# Patient Record
Sex: Female | Born: 1945 | Race: White | Hispanic: No | Marital: Married | Smoking: Former smoker
Health system: Southern US, Community
[De-identification: ages and names within clinical notes are randomized; demographics above are authoritative.]

## PROBLEM LIST (undated history)

## (undated) DIAGNOSIS — E785 Hyperlipidemia, unspecified: Secondary | ICD-10-CM

## (undated) DIAGNOSIS — J45909 Unspecified asthma, uncomplicated: Secondary | ICD-10-CM

## (undated) DIAGNOSIS — J449 Chronic obstructive pulmonary disease, unspecified: Secondary | ICD-10-CM

## (undated) DIAGNOSIS — D369 Benign neoplasm, unspecified site: Secondary | ICD-10-CM

## (undated) DIAGNOSIS — K649 Unspecified hemorrhoids: Secondary | ICD-10-CM

## (undated) DIAGNOSIS — N179 Acute kidney failure, unspecified: Secondary | ICD-10-CM

## (undated) DIAGNOSIS — E119 Type 2 diabetes mellitus without complications: Secondary | ICD-10-CM

## (undated) DIAGNOSIS — C679 Malignant neoplasm of bladder, unspecified: Secondary | ICD-10-CM

## (undated) DIAGNOSIS — R945 Abnormal results of liver function studies: Secondary | ICD-10-CM

## (undated) DIAGNOSIS — I739 Peripheral vascular disease, unspecified: Secondary | ICD-10-CM

## (undated) DIAGNOSIS — I10 Essential (primary) hypertension: Secondary | ICD-10-CM

## (undated) HISTORY — DX: Unspecified asthma, uncomplicated: J45.909

## (undated) HISTORY — DX: Benign neoplasm, unspecified site: D36.9

## (undated) HISTORY — DX: Essential (primary) hypertension: I10

## (undated) HISTORY — DX: Acute kidney failure, unspecified: N17.9

## (undated) HISTORY — DX: Type 2 diabetes mellitus without complications: E11.9

## (undated) HISTORY — DX: Abnormal results of liver function studies: R94.5

## (undated) HISTORY — DX: Hypomagnesemia: E83.42

## (undated) HISTORY — DX: Hyperlipidemia, unspecified: E78.5

## (undated) HISTORY — DX: Unspecified hemorrhoids: K64.9

## (undated) HISTORY — DX: Peripheral vascular disease, unspecified: I73.9

## (undated) HISTORY — PX: CHOLECYSTECTOMY: SHX55

## (undated) HISTORY — DX: Chronic obstructive pulmonary disease, unspecified: J44.9

---

## 1988-09-16 HISTORY — PX: OTHER SURGICAL HISTORY: SHX169

## 2000-12-02 ENCOUNTER — Encounter: Admission: RE | Admit: 2000-12-02 | Discharge: 2001-03-02 | Payer: Self-pay | Admitting: Unknown Physician Specialty

## 2002-11-03 ENCOUNTER — Ambulatory Visit (HOSPITAL_COMMUNITY): Admission: RE | Admit: 2002-11-03 | Discharge: 2002-11-03 | Payer: Self-pay | Admitting: Internal Medicine

## 2002-11-03 HISTORY — PX: ESOPHAGOGASTRODUODENOSCOPY: SHX1529

## 2003-09-07 ENCOUNTER — Ambulatory Visit (HOSPITAL_COMMUNITY): Admission: RE | Admit: 2003-09-07 | Discharge: 2003-09-07 | Payer: Self-pay | Admitting: Unknown Physician Specialty

## 2003-10-05 ENCOUNTER — Ambulatory Visit (HOSPITAL_COMMUNITY): Admission: RE | Admit: 2003-10-05 | Discharge: 2003-10-05 | Payer: Self-pay | Admitting: Internal Medicine

## 2004-08-14 ENCOUNTER — Ambulatory Visit: Payer: Self-pay | Admitting: Family Medicine

## 2004-09-21 ENCOUNTER — Ambulatory Visit: Payer: Self-pay | Admitting: Family Medicine

## 2004-10-03 ENCOUNTER — Ambulatory Visit: Payer: Self-pay | Admitting: Family Medicine

## 2004-11-28 ENCOUNTER — Ambulatory Visit: Payer: Self-pay | Admitting: Family Medicine

## 2004-12-24 ENCOUNTER — Ambulatory Visit: Payer: Self-pay | Admitting: Family Medicine

## 2005-01-01 ENCOUNTER — Ambulatory Visit: Payer: Self-pay | Admitting: Family Medicine

## 2005-01-22 ENCOUNTER — Ambulatory Visit: Payer: Self-pay | Admitting: Family Medicine

## 2005-02-25 ENCOUNTER — Ambulatory Visit: Payer: Self-pay | Admitting: Family Medicine

## 2005-05-01 ENCOUNTER — Ambulatory Visit: Payer: Self-pay | Admitting: Family Medicine

## 2005-07-03 ENCOUNTER — Ambulatory Visit: Payer: Self-pay | Admitting: Family Medicine

## 2005-08-27 ENCOUNTER — Ambulatory Visit: Payer: Self-pay | Admitting: Family Medicine

## 2005-09-10 ENCOUNTER — Ambulatory Visit: Payer: Self-pay | Admitting: Family Medicine

## 2005-10-10 ENCOUNTER — Ambulatory Visit (HOSPITAL_COMMUNITY): Admission: RE | Admit: 2005-10-10 | Discharge: 2005-10-10 | Payer: Self-pay | Admitting: Dermatology

## 2005-10-11 ENCOUNTER — Emergency Department (HOSPITAL_COMMUNITY): Admission: EM | Admit: 2005-10-11 | Discharge: 2005-10-11 | Payer: Self-pay | Admitting: Emergency Medicine

## 2006-01-15 ENCOUNTER — Ambulatory Visit: Payer: Self-pay | Admitting: Family Medicine

## 2006-01-28 ENCOUNTER — Ambulatory Visit: Payer: Self-pay | Admitting: Family Medicine

## 2006-02-06 ENCOUNTER — Ambulatory Visit: Payer: Self-pay | Admitting: Family Medicine

## 2006-04-24 ENCOUNTER — Ambulatory Visit: Payer: Self-pay | Admitting: Family Medicine

## 2006-07-18 ENCOUNTER — Ambulatory Visit: Payer: Self-pay | Admitting: Family Medicine

## 2006-07-30 ENCOUNTER — Ambulatory Visit: Payer: Self-pay | Admitting: Family Medicine

## 2006-11-03 ENCOUNTER — Ambulatory Visit: Payer: Self-pay | Admitting: Family Medicine

## 2006-12-03 ENCOUNTER — Ambulatory Visit: Payer: Self-pay | Admitting: Internal Medicine

## 2006-12-03 ENCOUNTER — Ambulatory Visit (HOSPITAL_COMMUNITY): Admission: RE | Admit: 2006-12-03 | Discharge: 2006-12-03 | Payer: Self-pay | Admitting: Internal Medicine

## 2006-12-03 ENCOUNTER — Encounter (INDEPENDENT_AMBULATORY_CARE_PROVIDER_SITE_OTHER): Payer: Self-pay | Admitting: Specialist

## 2006-12-03 HISTORY — PX: COLONOSCOPY: SHX174

## 2006-12-04 DIAGNOSIS — D369 Benign neoplasm, unspecified site: Secondary | ICD-10-CM

## 2006-12-04 DIAGNOSIS — K649 Unspecified hemorrhoids: Secondary | ICD-10-CM

## 2006-12-04 HISTORY — DX: Benign neoplasm, unspecified site: D36.9

## 2006-12-04 HISTORY — DX: Unspecified hemorrhoids: K64.9

## 2007-02-04 ENCOUNTER — Ambulatory Visit: Payer: Self-pay | Admitting: Family Medicine

## 2009-07-12 ENCOUNTER — Ambulatory Visit: Payer: Self-pay | Admitting: Cardiology

## 2010-10-10 ENCOUNTER — Ambulatory Visit
Admission: RE | Admit: 2010-10-10 | Discharge: 2010-10-10 | Payer: Self-pay | Source: Home / Self Care | Attending: Thoracic Surgery | Admitting: Thoracic Surgery

## 2010-10-10 ENCOUNTER — Other Ambulatory Visit: Payer: Self-pay | Admitting: Thoracic Surgery

## 2010-10-10 DIAGNOSIS — R918 Other nonspecific abnormal finding of lung field: Secondary | ICD-10-CM

## 2010-10-11 NOTE — Letter (Signed)
October 10, 2010  Cherie Ouch [address] Laurell Josephs. [place], [place] [address]  Re:  MONEA, PESANTEZ             DOB:  [birth year]  Dear Dr. Orson Aloe:  I appreciate the opportunity of seeing the patient.  This 65 year old patient was admitted in December with the right upper lobe pneumonia and had a right upper lobe mass.  There were also some subcarinal and questionable paratracheal adenopathy.  She is a long-time smoker, has been on oxygen for many nights for a while and is now on oxygen all the time.  Her other medical history include diabetes mellitus.  She had a right BKA.  She has Raynaud phenomenon, has esophageal reflux.  She had her bronchoscopy which showed some dysplastic squamous cells, but was nondiagnostic.  She was referred to [place] for further evaluation.  CT scan of the brain was negative.  Her medications include; Spiriva, glyburide, metformin, Actos, pravastatin, nifedipine, aspirin, Avapro, Clobetasol, Diovan, Januvia, Prilosec, and albuterol inhaler.  She is on 2 L of oxygen.  Mobic caused her to retain fluid.  FAMILY HISTORY:  Unremarkable.  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  SOCIAL HISTORY:  She is retired from [employer], quit smoking in 1991. Does not drink alcohol on a regular basis.  REVIEW OF SYSTEMS:  VITAL SIGNS:  She is 5 feet, 9 inches. GENERAL:  Weight is stable.  She has shortness of breath with exertion lying flat.  She is on home oxygen.  No hemoptysis. GI:  Reflux. GU:  No kidney disease, dysuria or frequent urination. VASCULAR:  Pain in her left foot when lying flat.  No DVT or TIAs. NEUROLOGICAL:  No dizziness, headaches, blackouts or seizures. MUSCULOSKELETAL:  Rash. PSYCHIATRIC:  No depression or nervousness. EYES/ENT:  She has had some recent decrease in her eyesight.  No changes in her hearing. HEMATOLOGICAL:  No problems with bleeding, clotting disorders,  or anemia.  PHYSICAL EXAMINATION:  Vital Signs:  Her blood pressure is 152/90, pulse 88, respirations 16, sats were 95%.  Head, Eyes, Ears, Nose and Throat: Unremarkable.  Neck:  Supple without thyromegaly.  There is no supraclavicular or axillary adenopathy.  Chest:  Clear to auscultation and percussion.  Heart:  Regular sinus rhythm.  No murmurs.  Abdomen: Soft with no hepatosplenomegaly.  Extremities:  Pulses are 2+.  She has a right BKA prosthesis.  No clubbing on the left side and no edema on the left side.  Neurological:  She is oriented x3.  Sensory and motor grossly are intact.  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  I will get the PET scan and then proceed with probably bronchoscopy with endobronchial ultrasound.  I appreciate the opportunity of seeing the patient.  Ines Bloomer, M.D. Electronically Signed  DPB/MEDQ  D:  10/10/2010  T:  10/11/2010  Job:  [id number]

## 2010-10-18 ENCOUNTER — Encounter (HOSPITAL_COMMUNITY): Payer: Medicaid Other | Attending: Thoracic Surgery

## 2010-10-18 ENCOUNTER — Encounter (HOSPITAL_COMMUNITY): Payer: Self-pay

## 2010-10-18 ENCOUNTER — Ambulatory Visit (HOSPITAL_COMMUNITY)
Admission: RE | Admit: 2010-10-18 | Discharge: 2010-10-18 | Disposition: A | Discharge status: Home / Self Care | Payer: Medicaid Other | Source: Ambulatory Visit | Attending: Thoracic Surgery | Admitting: Thoracic Surgery

## 2010-10-18 DIAGNOSIS — K7689 Other specified diseases of liver: Secondary | ICD-10-CM | POA: Insufficient documentation

## 2010-10-18 DIAGNOSIS — R222 Localized swelling, mass and lump, trunk: Secondary | ICD-10-CM | POA: Insufficient documentation

## 2010-10-18 DIAGNOSIS — I2510 Atherosclerotic heart disease of native coronary artery without angina pectoris: Secondary | ICD-10-CM | POA: Insufficient documentation

## 2010-10-18 DIAGNOSIS — R918 Other nonspecific abnormal finding of lung field: Secondary | ICD-10-CM

## 2010-10-18 DIAGNOSIS — I517 Cardiomegaly: Secondary | ICD-10-CM | POA: Insufficient documentation

## 2010-10-18 DIAGNOSIS — Z01812 Encounter for preprocedural laboratory examination: Secondary | ICD-10-CM | POA: Insufficient documentation

## 2010-10-18 LAB — CBC
HCT: 38.4 % (ref 36.0–46.0)
Hemoglobin: 12.7 g/dL (ref 12.0–15.0)
MCH: 28.4 pg (ref 26.0–34.0)
MCHC: 33.1 g/dL (ref 30.0–36.0)
MCV: 85.9 fL (ref 78.0–100.0)
Platelets: 325 10*3/uL (ref 150–400)
RBC: 4.47 MIL/uL (ref 3.87–5.11)
RDW: 13.8 % (ref 11.5–15.5)
WBC: 10.9 10*3/uL — ABNORMAL HIGH (ref 4.0–10.5)

## 2010-10-18 LAB — COMPREHENSIVE METABOLIC PANEL
ALT: 44 U/L — ABNORMAL HIGH (ref 0–35)
AST: 40 U/L — ABNORMAL HIGH (ref 0–37)
Albumin: 4.2 g/dL (ref 3.5–5.2)
Alkaline Phosphatase: 63 U/L (ref 39–117)
BUN: 10 mg/dL (ref 6–23)
CO2: 28 mEq/L (ref 19–32)
Calcium: 9.8 mg/dL (ref 8.4–10.5)
Chloride: 103 mEq/L (ref 96–112)
Creatinine, Ser: 0.72 mg/dL (ref 0.4–1.2)
GFR calc Af Amer: >60 mL/min (ref >60)
GFR calc non Af Amer: >60 mL/min (ref >60)
Glucose, Bld: 148 mg/dL — ABNORMAL HIGH (ref 70–99)
Potassium: 4.4 mEq/L (ref 3.5–5.1)
Sodium: 141 mEq/L (ref 135–145)
Total Bilirubin: 0.4 mg/dL (ref 0.3–1.2)
Total Protein: 7.4 g/dL (ref 6.0–8.3)

## 2010-10-18 LAB — PROTIME-INR
INR: 0.96 (ref 0.00–1.49)
Prothrombin Time: 13 seconds (ref 11.6–15.2)

## 2010-10-18 LAB — SURGICAL PCR SCREEN
MRSA, PCR: NEGATIVE
Staphylococcus aureus: NEGATIVE

## 2010-10-18 LAB — APTT: aPTT: 28 seconds (ref 24–37)

## 2010-10-18 LAB — GLUCOSE, CAPILLARY: Glucose-Capillary: 171 mg/dL — ABNORMAL HIGH (ref 70–99)

## 2010-10-18 MED ORDER — FLUDEOXYGLUCOSE F - 18 (FDG) INJECTION: 18.4000 | Freq: Once | INTRAVENOUS | Status: AC | PRN

## 2010-10-22 ENCOUNTER — Other Ambulatory Visit: Payer: Self-pay | Admitting: Thoracic Surgery

## 2010-10-22 ENCOUNTER — Encounter (HOSPITAL_COMMUNITY): Payer: Medicaid Other

## 2010-10-22 ENCOUNTER — Ambulatory Visit (HOSPITAL_COMMUNITY): Payer: Medicaid Other

## 2010-10-22 ENCOUNTER — Ambulatory Visit (HOSPITAL_COMMUNITY)
Admission: RE | Admit: 2010-10-22 | Discharge: 2010-10-22 | Disposition: A | Discharge status: Home / Self Care | Payer: Medicaid Other | Source: Ambulatory Visit | Attending: Thoracic Surgery | Admitting: Thoracic Surgery

## 2010-10-22 DIAGNOSIS — Z01811 Encounter for preprocedural respiratory examination: Secondary | ICD-10-CM

## 2010-10-22 DIAGNOSIS — R599 Enlarged lymph nodes, unspecified: Secondary | ICD-10-CM | POA: Insufficient documentation

## 2010-10-22 DIAGNOSIS — R222 Localized swelling, mass and lump, trunk: Secondary | ICD-10-CM | POA: Insufficient documentation

## 2010-10-22 DIAGNOSIS — D381 Neoplasm of uncertain behavior of trachea, bronchus and lung: Secondary | ICD-10-CM

## 2010-10-22 DIAGNOSIS — Z01812 Encounter for preprocedural laboratory examination: Secondary | ICD-10-CM | POA: Insufficient documentation

## 2010-10-22 HISTORY — PX: OTHER SURGICAL HISTORY: SHX169

## 2010-10-22 LAB — GLUCOSE, CAPILLARY: Glucose-Capillary: 103 mg/dL — ABNORMAL HIGH (ref 70–99)

## 2010-10-24 ENCOUNTER — Ambulatory Visit (INDEPENDENT_AMBULATORY_CARE_PROVIDER_SITE_OTHER): Payer: Medicaid Other | Admitting: Thoracic Surgery

## 2010-10-24 DIAGNOSIS — D491 Neoplasm of unspecified behavior of respiratory system: Secondary | ICD-10-CM

## 2010-10-24 LAB — CULTURE, RESPIRATORY W GRAM STAIN

## 2010-10-26 NOTE — Letter (Signed)
October 24, 2010  Karin Lieu, MD [phone] [place], [place]  [address]  Re:  LATANZA, PFEFFERKORN             DOB:  October 11, 1945  Dr. Orson Aloe,  I saw the patient back today after bronchoscopy and EBUS and all of her bronchial washings and lymph node aspirations and brushings were all completely negative.  The PET scan that we got on her prior to this does show that there has been a marked decrease in the size of the right upper lobe lesion and lymph nodes, so this looks more like this is probably an inflammatory situation rather than a cancer.  I informed her of this today.  Her blood pressure was 182/80, pulse 72, respirations 18, sats were 97% on 2 liters of oxygen.  I will see her back again in 2 months with a CT scan just to be sure this right upper lobe inflammatory process is completely resolved.  I appreciate the opportunity of seeing the patient.  Ines Bloomer, M.D. Electronically Signed  DPB/MEDQ  D:  10/24/2010  T:  10/25/2010  Job:  [id number]  cc:   Delaney Meigs, M.D.

## 2010-11-01 NOTE — Op Note (Signed)
  Debbie [name], Debbie [name]             ACCOUNT NO.:  [id number]  MEDICAL RECORD NO.:  [id number]           PATIENT TYPE:  LOCATION:                                 FACILITY:  PHYSICIAN:  Ines Bloomer, M.D. DATE OF BIRTH:  1946/05/06  DATE OF PROCEDURE: DATE OF DISCHARGE:                              OPERATIVE REPORT   PREOPERATIVE DIAGNOSIS:  Right upper lobe mass.  POSTOPERATIVE DIAGNOSIS:  Right upper lobe mass.  OPERATION PERFORMED:  Fiberoptic bronchoscopy with endobronchial ultrasound.  This patient had a right upper lobe infiltrate with mediastinal adenopathy, had a previous bronchoscopy which was negative.  We did a PET scan on her that showed some decrease in the right upper lobe mass as well as some decrease in the adenopathy, so we thought this might be inflammatory.  It was decided to proceed with repeat bronchoscopy with endobronchial ultrasound.  After general anesthesia, the video bronchoscope was passed through the endotracheal tube.  The carina was in the midline.  The right upper lobe, right middle lobe, and right lower lobe orifices were normal.  The left mainstem, left upper lobe, and left lower lobe orifices were normal.  Washings and sites were taken for cytologies and cultures for fungus and AFB.  The video bronchoscope was removed.  The endobronchial ultrasound was removed and identified 2 large 10R and a 7 node.  We then passed the sheath through the endobronchial ultrasound and under ultrasound guidance passed a needle out through the sheath and to the 10R and then likewise to the 7 node.  We did 2 passes on the 10R and 2 passes on the 7.  After this was then done and after the pass had been done, we put the needle back in the sheath and then removed the sheath and the needle and as mentioned this repeated 4 times and obtained 4 different samples.  They were sent for cytology.  The video bronchoscope with the endobronchial ultrasound was then  removed.  The patient tolerated the procedure and was turned to the recovery room in stable condition.     Ines Bloomer, M.D.     DPB/MEDQ  D:  10/22/2010  T:  10/22/2010  Job:  [id number]  Electronically Signed by Jovita Gamma M.D. on 11/01/2010 03:38:49 PM

## 2010-11-19 LAB — FUNGUS CULTURE W SMEAR: Fungal Smear: NONE SEEN

## 2010-12-03 ENCOUNTER — Other Ambulatory Visit: Payer: Self-pay | Admitting: Thoracic Surgery

## 2010-12-03 DIAGNOSIS — R911 Solitary pulmonary nodule: Secondary | ICD-10-CM

## 2010-12-04 LAB — AFB CULTURE WITH SMEAR (NOT AT ARMC): Acid Fast Smear: NONE SEEN

## 2010-12-19 ENCOUNTER — Ambulatory Visit (INDEPENDENT_AMBULATORY_CARE_PROVIDER_SITE_OTHER): Payer: Medicare Other | Admitting: Thoracic Surgery

## 2010-12-19 ENCOUNTER — Ambulatory Visit
Admission: RE | Admit: 2010-12-19 | Discharge: 2010-12-19 | Disposition: A | Discharge status: Home / Self Care | Payer: Medicare Other | Source: Ambulatory Visit | Attending: Thoracic Surgery | Admitting: Thoracic Surgery

## 2010-12-19 DIAGNOSIS — D491 Neoplasm of unspecified behavior of respiratory system: Secondary | ICD-10-CM

## 2010-12-19 DIAGNOSIS — R911 Solitary pulmonary nodule: Secondary | ICD-10-CM

## 2010-12-20 NOTE — Assessment & Plan Note (Signed)
OFFICE VISIT  RADONNA, BRACHER DOB:  Jun 17, 1946                                        December 19, 2010 CHART #:  [id number]  HISTORY:  The patient comes in today for a 54-month followup.  She has been followed for a right upper lobe lung mass which was found in December of 2011 at the time of her right upper lobe pneumonia.  She has undergone bronchoscopy and EBUS with negative pathology.  On her previous visits, the CT and PET scan showed marked decrease in size of the right upper lobe lesion.  Since her last visit, she has had no changes in her medical history.  Her breathing has been stable, and she has had no cough or hemoptysis.  PHYSICAL EXAMINATION:  Vital Signs:  Blood pressure is 155/92, pulse is 79, respirations 20, O2 sat 92% on room air.  Heart:  Regular rate and rhythm without murmurs, rubs, or gallops.  Lungs:  Clear to auscultation.  IMAGING:  CT of the chest performed today shows essentially complete resolution of the right upper lobe mass.  ASSESSMENT AND PLAN:  Dr. Edwyna Shell saw the patient today and reviewed her films.  It sounds as though this lesion was inflammatory in nature and it does appear to be resolved.  We will see her back as needed.  Coral Ceo, P.A.  GC/MEDQ  D:  12/19/2010  T:  12/20/2010  Job:  [id number]  cc:   Ruffin Frederick, M.D.

## 2011-02-01 NOTE — Op Note (Signed)
NAME:  Debbie [name], Debbie [name]                       ACCOUNT NO.:  [id number]   MEDICAL RECORD NO.:  [id number]                   PATIENT TYPE:  OUT   LOCATION:  RAD                                  FACILITY:  [facility]   PHYSICIAN:  R. Roetta Sessions, M.D.              DATE OF BIRTH:  [birth year]   DATE OF PROCEDURE:  DATE OF DISCHARGE:                                 OPERATIVE REPORT   PROCEDURE PERFORMED:  Urgent esophagogastroduodenoscopy to assess for  foreign body.   INDICATIONS FOR PROCEDURE:  The patient is a 65 year old lady who reported  to Dr. Dewaine Conger that she got a rabbit bone caught in the back of her throat  when she was eating rabbit last p.m.  It has been irritated ever since.  She  felt that it has moved further down, pointing to her suprasternal notch.  She has been able to swallow liquids but not solids.  Dr. Dewaine Conger called me  yesterday to see this lady.  EGD is now being done urgently to assess for  foreign body.  This approach has been discussed with the patient.  She does  not have any chronic symptoms of dysphagia or other GI tract symptoms.  She  reports undergoing an EGD with dilation by Dr. Michae Kava in 1995.  She has not  had any more dysphagia since that time.  EGD is now being done.  This  approach has been discussed with the patient with the potential risks and  benefits and alternatives have been reviewed.  Questions answered.  Please  see my hand-written H&P.   MONITORING:  O2 saturation, blood pressure, and pulses were effectively  monitored through the entire procedure.   PREMEDICATION:  Conscious sedation of Versed 3 mg IV, Demerol 75 mg IV in  divided doses.   INSTRUMENT:  Olympus video tip gastroscope.   FINDINGS:  Examination of the hypopharynx was carefully undertaken.  I was  unable to identify a foreign body in the piriform sinus or anywhere else.  The mucosa appeared normal.  The cricopharyngeus was easily intubated,  carefully inspected.  The  cricopharyngeus as I did the tubular esophagus and  I was unable to identify any foreign body or any mucosal abnormality.  The  EG junction was easily traversed.  Stomach:  The gastric cavity was empty and insufflated well with air.  A  thorough examination of the gastric mucosa including a retroflexed view of  the proximal stomach, esophagogastric junction demonstrated no  abnormalities.  The pylorus was patent and easily traversed.  Duodenum:  The bulb and second portion and third appeared normal.   THERAPEUTIC/DIAGNOSTIC MANEUVERS:  None.   The patient tolerated the procedure well, was reactive in endoscopy.   IMPRESSION:  Normal hypopharynx, esophagus, stomach, and duodenum and second  portion.   I suspect the patient has transient food impaction producing some trauma to  the mucosa producing her symptoms.  Today's findings  are reassuring.    RECOMMENDATIONS:  1. Might use Chloraseptic p.r.n. for the next 24-48 hours as throat     discomfort subsides.  She should do well.  2. As a totally separate issue, I note she has never had a colonoscopy for     colorectal cancer screening purposes.  I suggest that she discuss this     approach with Dr. Dewaine Conger when she sees him at her next visit.                                                 Jonathon Bellows, M.D.    RMR/MEDQ  D:  11/03/2002  T:  11/03/2002  Job:  [id number]   cc:   Colon Flattery  [address]  [place]  [place] [address]  Fax: [phone]

## 2011-02-01 NOTE — Op Note (Signed)
[name], [name]             ACCOUNT NO.:  [id number]   MEDICAL RECORD NO.:  [id number]          PATIENT TYPE:  AMB   LOCATION:  DAY                           FACILITY:  [facility]   PHYSICIAN:  R. Roetta Sessions, M.D. DATE OF BIRTH:  05-13-1946   DATE OF PROCEDURE:  12/03/2006  DATE OF DISCHARGE:                               OPERATIVE REPORT   PROCEDURE:  Colonoscopy with snare polypectomy.   INDICATIONS FOR PROCEDURE:  The patient is a 65 year old Caucasian  female with a history of colonic adenomas.  Last colonoscopy was 3 years  ago.  She is not have any lower GI tract symptoms.  Colonoscopy is now  being done.  This approach has been discussed with the patient at  length.  Potential risks, benefits, alternatives have been reviewed and  questions answered.  Please see documentation in the medical record.   PROCEDURE NOTE:  O2 saturation, blood pressure, pulse and respirations  were monitored throughout the entire procedure.  Conscious sedation with  Versed 3 mg IV, Demerol 75 mg IV in divided doses.   INSTRUMENT:  Pentax video chip system.   FINDINGS:  Digital rectal exam revealed no abnormalities.  The prep was  good.  Examination of the colonic mucosa was undertaken from the  rectosigmoid junction where the scope was advanced through the left  transverse, right colon into the appendiceal orifice, ileocecal valve  and cecum.  These structures were well seen and photographed for the  record.  From this area the scope was slowly and cautiously withdrawn.  All previously mentioned mucosal surfaces were again seen.  The colonic  mucosa appeared normal aside from a 6 mm pedunculated polyp at the  splenic flexure.  This was removed with cold snare technique.  The scope  was pulled down in the rectum where a thorough examination of the rectal  mucosa and retroflexed view of the anal verge demonstrated only minimal  internal hemorrhoids.  The patient tolerated the procedure well and  was  reactive to endoscopy.   IMPRESSION:  1. Minimal internal hemorrhoids, otherwise normal rectum.  2. Pedunculated polyp at the splenic flexure removed with snare as      described above.  Remainder of colonic mucosa appeared normal.   RECOMMENDATIONS:  1. No aspirin or __________ medications for the next in 10 days.  2. Follow-up on path.  3. Further recommendations to follow.      Jonathon Bellows, M.D.  Electronically Signed     RMR/MEDQ  D:  12/03/2006  T:  12/03/2006  Job:  [id number]   cc:   Delaney Meigs, M.D.  Fax: [phone]

## 2011-02-01 NOTE — H&P (Signed)
Debbie [name], Debbie [name]                       ACCOUNT NO.:  [id number]   MEDICAL RECORD NO.:  [id number]                  PATIENT TYPE:   LOCATION:                                       FACILITY:  [facility]   PHYSICIAN:  Lionel December, M.D.                 DATE OF BIRTH:  11-08-1945   DATE OF ADMISSION:  DATE OF DISCHARGE:                                HISTORY & PHYSICAL   REQUESTING PHYSICIAN:  Colon Flattery, MD   CHIEF COMPLAINT:  Hematochezia.   HISTORY OF PRESENT ILLNESS:  This patient is a 65 year old Caucasian female  who presents to our office for evaluation of a 2 week history of small  volume hematochezia when she noticed bright red blood, spotting in her  underwear as well as on toilet paper; especially after a hard bowel  movement.  Prior to this episode, 1 month ago, she did have some  constipation and was having bowel movements every 3-4 days with lots of  straining. She does report some pruritus and occasional rectal pain as well.  Hematochezia was intermittent for approximately 2 weeks and has resolved  over the last 2 weeks.  She denies any upper GI symptoms such as heartburn  or indigestion. She did have foreign body obstruction and EGD removal by Dr.  Jena Gauss in approximately February of this year.  She denies any melena or  diarrhea.  EGD prior to foreign body obstruction was November 06, 1995 which  was normal along with esophageal dilatation for some dysphagia.  She reports  relief from her dysphagia.  She is also being seen by Dr. Dewaine Conger and treated  for some left hip pain.  She has been on some intermittent ibuprofen, takes  aspirin 81 mg daily and does have a history of Mobic use; however, this was  after her episode of rectal bleeding.   PAST MEDICAL HISTORY:  1. Diabetes mellitus.  2. Hypertension.  3. Hyperlipidemia.  4. Berger's disease.  5. Foreign body obstruction in February 2004.  6. Last EGD by Dr. Karilyn Cota November 06, 1995 which is normal with 17  French     Maloney dilatation secondary to dysphagia.   PAST SURGICAL HISTORY:  1. Cholecystectomy 40 years ago secondary to cholecystitis.  2. 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 Arvilla Market at [facility].  3. 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   CURRENT MEDICATIONS:  1. Glucovance 12.5 mg 2 tablets in the morning and 2 tablets in the evening.  2. Avapro 150 mg daily.  3. Pravachol 40 mg 1-1/2 tablets daily.  4. Aspirin 81 mg daily.  5. Procardia XL 30 mg daily.   ALLERGIES:  No known drug allergies.   FAMILY HISTORY:  No known family history of colorectal carcinoma or other GI  problems.  Mother is deceased at age 68 secondary to CVA.  Father is  deceased at age 12 secondary to lung carcinoma.  She has 3 sisters in good  health except for one with asthma.   SOCIAL HISTORY:  The patient has been married for 42 years. She has 4  grandchildren in good health.  She is currently disabled.  She currently  does not smoke, however, she does report an 18-year history of smoking 1  pack per day, quitting approximately 13 years ago.  She denies any alcohol  or drug use.   REVIEW OF SYSTEMS:  CONSTITUTIONAL:  Weight is stable.  Denies any fever or  chills; is complaining of occasional fatigue.  CARDIOVASCULAR:  Reports  occasional palpitations with anxiety.  She does report that she did have a  Cardiolite Stress Test which was normal, as well as normal EKG a while ago.  MUSCULOSKELETAL:  Being followed by Dr. Dewaine Conger for left hip pain.  GI:  See  HPI.   PHYSICAL EXAMINATION:  VITAL SIGNS:  Weight 225 pounds.  Height 68 inches.  Temperature 97.2, blood pressure 120/90, pulse 76.  GENERAL:  This patient is a 65 year old, overweight, Caucasian female who is  alert, oriented, pleasant and cooperative.  HEENT:  Sclerae are clear.  Nonicteric.  Conjunctivae pink.  Oropharynx pink  and moist without any lesions.  NECK:  Supple without any masses or thyromegaly.  HEART:  Regular rate and rhythm without murmurs, rubs, clicks, or gallops.  Normal S1-S2.  LUNGS:  Clear to auscultation bilaterally.  ABDOMEN:  Obese with positive bowel sounds x4, soft, nontender, nondistended  with no palpable masses or organomegaly.  However, the exam is limited due  to the patient's body habitus.  RECTAL:  A few external hemorrhoids; however, they do not appear to be  actively bleeding, thrombosed or erythematous.  Good sphincter tone.  There  is a small amount of light brown Hemoccult positive stool.  EXTREMITIES:  Left lower extremity with good pulse and no edema.  She does  have right lower extremity amputation with a right prosthesis in place.   LABORATORY EVALUATION:  From September 06, 2003:  WBC 12.3, hemoglobin 14.1,  hematocrit 40.6, platelets 363.   ASSESSMENT:  This patient is a 65 year old Caucasian female with small  volume intermittent hematochezia of 2 weeks duration.  This episode was  preceded by constipation and straining.  Therefore, may be related to benign  anorectal source such as hemorrhoids, however, she has not had screening  colonoscopy and this needs to be done to rule out colorectal carcinoma.  Further evaluation is warranted.  She does not appear to be have any upper  GI concerns at this time.  Also has a history of constipation.  Will  consider treatment for this, pending colonoscopy results.  Have asked her to  increase her dietary fiber in the meantime.   RECOMMENDATIONS:  1. Colonoscopy to be performed at [facility] by Dr. Karilyn Cota.  I have     discussed this procedure with the patient, including the risks and     benefits which include, but are not limited to, bleeding, perforation,     and infection.  She agrees with this plan.  Consent will be obtained.  I     have asked her to hold her aspirin 3 days prior    to her  procedure and half of her diabetic medications the day before and     of the procedure.  2. Further recommendations pending colonoscopy.   We would like to thank [place]. Dewaine Conger [place] for this kind  referral.     _____________________________________  ___________________________________________  Nicholas Lose, N.P.               Lionel December, M.D.   KC/MEDQ  D:  09/15/2003  T:  09/15/2003  Job:  [id number]   cc:   Colon Flattery, MD  [address]  [place]  [place] [address]  Fax: [phone]   Lionel December, M.D.  [address]  [place]  [place] [address]  Fax: [phone]

## 2011-02-01 NOTE — Op Note (Signed)
NAME:  Debbie [name], Debbie [name]                       ACCOUNT NO.:  [id number]   MEDICAL RECORD NO.:  [id number]                   PATIENT TYPE:  AMB   LOCATION:  DAY                                  FACILITY:  [facility]   PHYSICIAN:  R. Roetta Sessions, M.D.              DATE OF BIRTH:  1946-01-09   DATE OF PROCEDURE:  10/05/2003  DATE OF DISCHARGE:                                 OPERATIVE REPORT   PROCEDURE:  Colonoscopy with snare polypectomy.   INDICATIONS FOR PROCEDURE:  [name] is a 65 year old lady with  intermittent constipation who has had small volume painless hematochezia  recently.  Colonoscopy is now being done to further evaluate her symptoms.  This approach has been discussed with the patient previously and again today  at the bedside.  The potential risks, benefits, and alternatives have been  reviewed and questions answered.  Please see my dictated H&P from 09/15/2003  for more information.   PROCEDURE:  O2 saturation, blood pressure, pulses, and respirations were  monitored throughout the entire procedure.  Conscious sedation was with  Versed 4 mg IV, Demerol 75 mg IV.  The instrument used was the Olympus CF140  colonoscope.   FINDINGS:  Digital rectal examination revealed no abnormalities.   ENDOSCOPIC FINDINGS:  The prep was good.   Rectum:  Examination of the rectal mucosa including retroflex view of the  anal verge revealed no abnormalities.   Colon:  The colonic mucosa was surveyed from the rectosigmoid junction  through the left, transverse, right colon to the area of the appendiceal  orifice, ileocecal valve, and cecum.  These structures were well-seen and  photographed for the record.  From the level of the cecum and ileocecal  valve, the scope was slowly withdrawn.  All previously mentioned mucosal  surfaces were again seen.  The patient was noted to have blood-tinged mucous  trailing from the rectosigmoid all the way up to 30 cm where I found a 2-cm  pedunculated polyp on a broad stalk.  Please see photos.  The patient also  had some extrinsic compression in the area of the cecum opposite the  ileocecal valve.  This was felt to be extrinsic compression and not a  lipoma.  At any rate, the mucosa in this segment appeared normal.   Attention was turned to the polyp at 30 cm.  It was on a fold at a flexure  and somewhat hard to see.  However, it was ultimately removed in piecemeal  fashion with snare cautery, and multiple fragments were recovered.   The patient tolerated the procedure well.   IMPRESSION:  1. Normal rectum.  2. Pedunculated polyp at 30 cm (likely the source of the patient's     intermittent hematochezia), removed in piecemeal fashion with snare     cautery.   RECOMMENDATIONS:  1. No aspirin or arthritis medications for 10 days.  2. Follow up pathology.  3. The  patient should continue taking Benefiber but needs to take it really     everyday to get the optimal effects as far as bowel function is     concerned.  4. Further recommendations to follow.      ___________________________________________                                            Jonathon Bellows, M.D.   RMR/MEDQ  D:  10/05/2003  T:  10/05/2003  Job:  [id number]   cc:   Colon Flattery, MD  [address]  [place]  [place] [address]  Fax: [phone]

## 2011-06-14 DIAGNOSIS — E119 Type 2 diabetes mellitus without complications: Secondary | ICD-10-CM

## 2011-06-14 DIAGNOSIS — J449 Chronic obstructive pulmonary disease, unspecified: Secondary | ICD-10-CM

## 2011-06-14 DIAGNOSIS — E1159 Type 2 diabetes mellitus with other circulatory complications: Secondary | ICD-10-CM | POA: Insufficient documentation

## 2011-06-14 DIAGNOSIS — E785 Hyperlipidemia, unspecified: Secondary | ICD-10-CM

## 2011-06-14 DIAGNOSIS — R918 Other nonspecific abnormal finding of lung field: Secondary | ICD-10-CM | POA: Insufficient documentation

## 2011-06-14 DIAGNOSIS — Z794 Long term (current) use of insulin: Secondary | ICD-10-CM | POA: Insufficient documentation

## 2011-06-14 DIAGNOSIS — C801 Malignant (primary) neoplasm, unspecified: Secondary | ICD-10-CM

## 2011-06-14 DIAGNOSIS — I10 Essential (primary) hypertension: Secondary | ICD-10-CM

## 2011-06-19 ENCOUNTER — Ambulatory Visit (INDEPENDENT_AMBULATORY_CARE_PROVIDER_SITE_OTHER): Payer: Medicare Other | Admitting: Thoracic Surgery

## 2011-06-19 VITALS — BP 134/85 | HR 76 | Resp 20 | Ht 68.0 in | Wt 220.0 lb

## 2011-06-19 DIAGNOSIS — R222 Localized swelling, mass and lump, trunk: Secondary | ICD-10-CM

## 2011-06-19 NOTE — Progress Notes (Signed)
HPI this patient returns for followup. We'll leave follow her in early 2012 of for a right upper lobe infiltrate which resolved. She is now referred back for questionable recurrent mass. CT scan done in the last week showed a 2.9 mm lesion in the right middle lobe and the tube on 3.9 lesion in the right lower lobe. There is some scarring in the upper lobe and the middle lobe do not see any adenopathy I think there is no evidence of any recurrence of any cancer. She still complains of being short of breath. Because of the continued concern I'll see her back in place one more time in 6 months with another CT.   Current Outpatient Prescriptions  Medication Sig Dispense Refill  . aspirin EC 81 MG tablet Take 81 mg by mouth daily.        . budesonide-formoterol (SYMBICORT) 160-4.5 MCG/ACT inhaler Inhale 2 puffs into the lungs 2 (two) times daily.        Marland Kitchen glyBURIDE-metformin (GLUCOVANCE) 1.25-250 MG per tablet Take 1 tablet by mouth 2 (two) times daily with a meal.        . metFORMIN (GLUCOPHAGE) 500 MG tablet Take 500 mg by mouth daily with breakfast.        . NIFEdipine (PROCARDIA XL/ADALAT-CC) 30 MG 24 hr tablet Take 30 mg by mouth daily.       Marland Kitchen omeprazole (PRILOSEC) 20 MG capsule Take 20 mg by mouth daily.        . pravastatin (PRAVACHOL) 40 MG tablet Take 40 mg by mouth daily. Take 2 tablets daily       . sitaGLIPtin (JANUVIA) 100 MG tablet Take 100 mg by mouth daily.        Marland Kitchen tiotropium (SPIRIVA) 18 MCG inhalation capsule Place 18 mcg into inhaler and inhale daily.        . valsartan (DIOVAN) 160 MG tablet Take 160 mg by mouth daily.           Review of Systems: Shortness of breath   Physical Exam  Cardiovascular: Normal rate, regular rhythm and normal heart sounds.   Pulmonary/Chest: Effort normal and breath sounds normal. No respiratory distress.     Diagnostic Tests: CT scan at [facility] showed a 2.9 and a 3.9 nodule in the right middle lobe and lower lobe respectively. Areas cardia  and the right upper lobe and right middle lobe no other lesion is seen.   Impression: Small pulmonary nodules   Plan: Followup in 6 months

## 2011-11-20 ENCOUNTER — Other Ambulatory Visit: Payer: Self-pay | Admitting: Thoracic Surgery

## 2011-11-20 DIAGNOSIS — D381 Neoplasm of uncertain behavior of trachea, bronchus and lung: Secondary | ICD-10-CM

## 2011-12-09 ENCOUNTER — Encounter: Payer: Self-pay | Admitting: Internal Medicine

## 2011-12-24 ENCOUNTER — Ambulatory Visit
Admission: RE | Admit: 2011-12-24 | Discharge: 2011-12-24 | Disposition: A | Discharge status: Home / Self Care | Payer: Medicare Other | Source: Ambulatory Visit | Attending: Thoracic Surgery | Admitting: Thoracic Surgery

## 2011-12-24 ENCOUNTER — Ambulatory Visit: Payer: Medicare Other | Admitting: Thoracic Surgery

## 2011-12-24 ENCOUNTER — Encounter: Payer: Self-pay | Admitting: Thoracic Surgery

## 2011-12-24 ENCOUNTER — Ambulatory Visit (INDEPENDENT_AMBULATORY_CARE_PROVIDER_SITE_OTHER): Payer: Medicare Other | Admitting: Thoracic Surgery

## 2011-12-24 VITALS — BP 143/93 | HR 88 | Resp 20 | Ht 68.5 in | Wt 216.0 lb

## 2011-12-24 DIAGNOSIS — R918 Other nonspecific abnormal finding of lung field: Secondary | ICD-10-CM

## 2011-12-24 DIAGNOSIS — D381 Neoplasm of uncertain behavior of trachea, bronchus and lung: Secondary | ICD-10-CM

## 2011-12-24 NOTE — Progress Notes (Signed)
HPI a patient returns for followup today. CT scan showed no evidence of any cancer, the 3 mm nodules were unchanged. I will release her back to her primary medical physician for long-term followup. She is doing well overall with only dyspnea on exertion. No more problems with pneumonia.  Current Outpatient Prescriptions  Medication Sig Dispense Refill  . aspirin EC 81 MG tablet Take 81 mg by mouth daily.        . budesonide-formoterol (SYMBICORT) 160-4.5 MCG/ACT inhaler Inhale 2 puffs into the lungs 2 (two) times daily.        Marland Kitchen glyBURIDE-metformin (GLUCOVANCE) 1.25-250 MG per tablet Take 1 tablet by mouth 2 (two) times daily with a meal.        . metFORMIN (GLUCOPHAGE) 500 MG tablet Take 500 mg by mouth daily with breakfast.        . NIFEdipine (PROCARDIA XL/ADALAT-CC) 30 MG 24 hr tablet Take 30 mg by mouth daily.       Marland Kitchen omeprazole (PRILOSEC) 20 MG capsule Take 20 mg by mouth daily.        . pravastatin (PRAVACHOL) 40 MG tablet Take 40 mg by mouth daily. Take 2 tablets daily       . sitaGLIPtin (JANUVIA) 100 MG tablet Take 100 mg by mouth daily.        Marland Kitchen tiotropium (SPIRIVA) 18 MCG inhalation capsule Place 18 mcg into inhaler and inhale daily.        . valsartan (DIOVAN) 160 MG tablet Take 160 mg by mouth daily.           Review of Systems: Unchanged   Physical Exam lungs are clear auscultation percussion   Diagnostic Tests: CT scan shows no change in her small 3 mm nodules and no evidence of recurrence of her pneumonia Impression: Status post inflammatory process of the lung resolved   Plan: Return as needed

## 2012-02-21 ENCOUNTER — Ambulatory Visit: Payer: Medicare Other | Admitting: Internal Medicine

## 2012-02-28 ENCOUNTER — Encounter: Payer: Self-pay | Admitting: Internal Medicine

## 2012-02-28 ENCOUNTER — Ambulatory Visit (INDEPENDENT_AMBULATORY_CARE_PROVIDER_SITE_OTHER): Payer: Medicare Other | Admitting: Internal Medicine

## 2012-02-28 VITALS — BP 160/95 | HR 100 | Temp 98.1°F | Ht 68.0 in | Wt 215.6 lb

## 2012-02-28 DIAGNOSIS — Z860100 Personal history of colon polyps, unspecified: Secondary | ICD-10-CM

## 2012-02-28 DIAGNOSIS — Z1211 Encounter for screening for malignant neoplasm of colon: Secondary | ICD-10-CM

## 2012-02-28 DIAGNOSIS — K219 Gastro-esophageal reflux disease without esophagitis: Secondary | ICD-10-CM | POA: Insufficient documentation

## 2012-02-28 HISTORY — DX: Gastro-esophageal reflux disease without esophagitis: K21.9

## 2012-02-28 NOTE — Progress Notes (Signed)
Primary Care Physician:  NYLAND,LEONARD Zyniah Ferraiolo, MD Primary Gastroenterologist:  Dr. Yandel Zeiner  Pre-Procedure History & Physical: HPI:  Debbie [name] is a 66 y.o. female here for scheduling surveillance colonoscopy. Patient has history of multiple colonic adenomas over serial colonoscopies the last colonoscopy was done in 2008. She does have some postprandial diarrhea x1 frequently at each morning but then doesn't have any further bowel issues later in the day. No incontinence. No rectal bleeding. GERD symptoms well controlled on Prilosec 20 mg orally daily. No dysphagia. Prior EGD demonstrated no evidence of Barrett's esophagus. Chest lost 30 pounds the past 9 months to facilitate glycemic control. Her blood sugar still running a little high. She has had an extensive workup for atypical pulmonary infection which she states is now resolved..  Past Medical History  Diagnosis Date  . Lung mass   . DM type 2 (diabetes mellitus, type 2)   . Hypertension   . COPD (chronic obstructive pulmonary disease)   . Hyperlipidemia   . Hemorrhoid 12/04/2006  . Adenomatous polyp 12/04/2006    Past Surgical History  Procedure Date  . Fiberoptic bronchoscopy with endobronchial  ultrasound 10/22/2010    [place]  . Colonoscopy 12/03/2006    Dr. Afomia Blackley-hemorrhoids, adenomatous polyp  . Esophagogastroduodenoscopy 11/03/2002    Dr. Golden Gilreath- normal exam- was done to check for possible foreign body    Prior to Admission medications   Medication Sig Start Date End Date Taking? Authorizing Provider  aspirin EC 81 MG tablet Take 81 mg by mouth daily.     Yes Historical Provider, MD  Calcium Carb-Cholecalciferol (CALCIUM 1000 + D PO) Take 1,000 mg by mouth daily.   Yes Historical Provider, MD  glyBURIDE-metformin (GLUCOVANCE) 1.25-250 MG per tablet Take 1 tablet by mouth 2 (two) times daily with a meal.     Yes Historical Provider, MD  metFORMIN (GLUCOPHAGE) 500 MG tablet Take 500 mg by mouth daily with breakfast.     Yes  Historical Provider, MD  NIFEdipine (PROCARDIA XL/ADALAT-CC) 30 MG 24 hr tablet Take 30 mg by mouth daily.  06/07/11  Yes Historical Provider, MD  omeprazole (PRILOSEC) 20 MG capsule Take 20 mg by mouth daily.     Yes Historical Provider, MD  pravastatin (PRAVACHOL) 40 MG tablet Take 40 mg by mouth daily. Take 2 tablets daily    Yes Historical Provider, MD  sitaGLIPtin (JANUVIA) 100 MG tablet Take 100 mg by mouth daily.     Yes Historical Provider, MD  tiotropium (SPIRIVA) 18 MCG inhalation capsule Place 18 mcg into inhaler and inhale daily.     Yes Historical Provider, MD  valsartan (DIOVAN) 160 MG tablet Take 160 mg by mouth daily.     Yes Historical Provider, MD  budesonide-formoterol (SYMBICORT) 160-4.5 MCG/ACT inhaler Inhale 2 puffs into the lungs 2 (two) times daily.      Historical Provider, MD    Allergies as of 02/28/2012 - Review Complete 02/28/2012  Allergen Reaction Noted  . Meloxicam  10/18/2010    No family history on file.  History   Social History  . Marital Status: Married    Spouse Name: N/A    Number of Children: N/A  . Years of Education: N/A   Occupational History  . Not on file.   Social History Main Topics  . Smoking status: Former Smoker    Quit date: 09/16/1989  . Smokeless tobacco: Never Used  . Alcohol Use: No  . Drug Use: No  . Sexually Active:      Other Topics Concern  . Not on file   Social History Narrative  . No narrative on file    Review of Systems: See HPI, otherwise negative ROS  Physical Exam: Pulse 100  Temp 98.1 F (36.7 C) (Temporal)  Ht 5' 8" (1.727 m)  Wt 215 lb 9.6 oz (97.796 kg)  BMI 32.78 kg/m2 General:   Alert,  Well-developed, well-nourished, pleasant and cooperative in NAD Skin:  Intact without significant lesions or rashes. Eyes:  Sclera clear, no icterus.   Conjunctiva pink. Ears:  Normal auditory acuity. Nose:  No deformity, discharge,  or lesions. Mouth:  No deformity or lesions. Neck:  Supple; no masses or  thyromegaly. No significant cervical adenopathy. Lungs:  Clear throughout to auscultation.   No wheezes, crackles, or rhonchi. No acute distress. Heart:  Regular rate and rhythm; no murmurs, clicks, rubs,  or gallops. Abdomen: Non-distended, normal bowel sounds.  Soft and nontender without appreciable mass or hepatosplenomegaly.  Pulses:  Normal pulses noted. Extremities:  Without clubbing or edema.  Prosthetic right lower extremity-status post BKA  Impression/Plan: Very pleasant 66-year-old lady with a history of colonic adenomas now due for surveillance colonoscopy. She really doesn't have any bowel symptoms aside from a loose stool in the morning after breakfast (after 2 cups of coffee). No diarrhea at other times.The risks, benefits, limitations, alternatives and imponderables have been reviewed with the patient. Questions have been answered. All parties are agreeable.   I recommended she might to cut back on her coffee or trial of Imodium AD either the first thing in the morning or at bedtime if episodic postprandial diarrhea becomes more of the aggravation; I do not feel that further evaluation of this complaint is warranted at this time.   GERD symptoms well controlled on Prilosec 20 mg daily. I feel the benefits of chronic therapy outweigh any theoretical risks at this time. Further recommendations to follow. 

## 2012-02-28 NOTE — Patient Instructions (Signed)
Schedule colonoscopy to follow-up on polyps (split Movie-prep)

## 2012-03-10 ENCOUNTER — Encounter (HOSPITAL_COMMUNITY): Payer: Self-pay | Admitting: Pharmacy Technician

## 2012-03-25 ENCOUNTER — Ambulatory Visit (HOSPITAL_COMMUNITY)
Admission: RE | Admit: 2012-03-25 | Discharge: 2012-03-25 | Disposition: A | Discharge status: Home / Self Care | Payer: Medicare Other | Source: Ambulatory Visit | Attending: Internal Medicine | Admitting: Internal Medicine

## 2012-03-25 ENCOUNTER — Encounter (HOSPITAL_COMMUNITY)
Admission: RE | Disposition: A | Discharge status: Home / Self Care | Payer: Self-pay | Source: Ambulatory Visit | Attending: Internal Medicine

## 2012-03-25 ENCOUNTER — Encounter (HOSPITAL_COMMUNITY): Payer: Self-pay

## 2012-03-25 DIAGNOSIS — K5730 Diverticulosis of large intestine without perforation or abscess without bleeding: Secondary | ICD-10-CM

## 2012-03-25 DIAGNOSIS — Z860100 Personal history of colon polyps, unspecified: Secondary | ICD-10-CM

## 2012-03-25 DIAGNOSIS — Z1211 Encounter for screening for malignant neoplasm of colon: Secondary | ICD-10-CM

## 2012-03-25 DIAGNOSIS — E785 Hyperlipidemia, unspecified: Secondary | ICD-10-CM | POA: Insufficient documentation

## 2012-03-25 DIAGNOSIS — Z79899 Other long term (current) drug therapy: Secondary | ICD-10-CM | POA: Insufficient documentation

## 2012-03-25 DIAGNOSIS — J4489 Other specified chronic obstructive pulmonary disease: Secondary | ICD-10-CM | POA: Insufficient documentation

## 2012-03-25 DIAGNOSIS — D126 Benign neoplasm of colon, unspecified: Secondary | ICD-10-CM

## 2012-03-25 DIAGNOSIS — J449 Chronic obstructive pulmonary disease, unspecified: Secondary | ICD-10-CM | POA: Insufficient documentation

## 2012-03-25 DIAGNOSIS — E119 Type 2 diabetes mellitus without complications: Secondary | ICD-10-CM | POA: Insufficient documentation

## 2012-03-25 DIAGNOSIS — I10 Essential (primary) hypertension: Secondary | ICD-10-CM | POA: Insufficient documentation

## 2012-03-25 DIAGNOSIS — Z01812 Encounter for preprocedural laboratory examination: Secondary | ICD-10-CM | POA: Insufficient documentation

## 2012-03-25 HISTORY — PX: COLONOSCOPY: SHX5424

## 2012-03-25 SURGERY — COLONOSCOPY
Anesthesia: Moderate Sedation

## 2012-03-25 MED ORDER — MEPERIDINE HCL 100 MG/ML IJ SOLN
INTRAMUSCULAR | Status: AC
  Filled 2012-03-25: qty 2

## 2012-03-25 MED ORDER — MIDAZOLAM HCL 5 MG/5ML IJ SOLN
INTRAMUSCULAR | Status: DC | PRN
  Administered 2012-03-25: 2 mg via INTRAVENOUS
  Administered 2012-03-25 (×3): 1 mg via INTRAVENOUS

## 2012-03-25 MED ORDER — MEPERIDINE HCL 100 MG/ML IJ SOLN
INTRAMUSCULAR | Status: DC | PRN
  Administered 2012-03-25: 25 mg via INTRAVENOUS
  Administered 2012-03-25: 50 mg via INTRAVENOUS
  Administered 2012-03-25: 25 mg via INTRAVENOUS

## 2012-03-25 MED ORDER — MIDAZOLAM HCL 5 MG/5ML IJ SOLN
INTRAMUSCULAR | Status: AC
  Filled 2012-03-25: qty 10

## 2012-03-25 MED ORDER — STERILE WATER FOR IRRIGATION IR SOLN
Status: DC | PRN
  Administered 2012-03-25: 10:00:00

## 2012-03-25 MED ORDER — SODIUM CHLORIDE 0.45 % IV SOLN
Freq: Once | INTRAVENOUS | Status: AC
  Administered 2012-03-25: 10:00:00 via INTRAVENOUS

## 2012-03-25 NOTE — H&P (View-Only) (Signed)
Primary Care Physician:  [name], MD Primary Gastroenterologist:  Dr. Jena Gauss  Pre-Procedure History & Physical: HPI:  Debbie [name] is a 66 y.o. female here for scheduling surveillance colonoscopy. Patient has history of multiple colonic adenomas over serial colonoscopies the last colonoscopy was done in 2008. She does have some postprandial diarrhea x1 frequently at each morning but then doesn't have any further bowel issues later in the day. No incontinence. No rectal bleeding. GERD symptoms well controlled on Prilosec 20 mg orally daily. No dysphagia. Prior EGD demonstrated no evidence of Barrett's esophagus. Chest lost 30 pounds the past 9 months to facilitate glycemic control. Her blood sugar still running a little high. She has had an extensive workup for atypical pulmonary infection which she states is now resolved..  Past Medical History  Diagnosis Date  . Lung mass   . DM type 2 (diabetes mellitus, type 2)   . Hypertension   . COPD (chronic obstructive pulmonary disease)   . Hyperlipidemia   . Hemorrhoid 12/04/2006  . Adenomatous polyp 12/04/2006    Past Surgical History  Procedure Date  . Fiberoptic bronchoscopy with endobronchial  ultrasound 10/22/2010    [place]  . Colonoscopy 12/03/2006    Dr. Anabel Bene, adenomatous polyp  . Esophagogastroduodenoscopy 11/03/2002    Dr. Jena Gauss- normal exam- was done to check for possible foreign body    Prior to Admission medications   Medication Sig Start Date End Date Taking? Authorizing Provider  aspirin EC 81 MG tablet Take 81 mg by mouth daily.     Yes Historical Provider, MD  Calcium Carb-Cholecalciferol (CALCIUM 1000 + D PO) Take 1,000 mg by mouth daily.   Yes Historical Provider, MD  glyBURIDE-metformin (GLUCOVANCE) 1.25-250 MG per tablet Take 1 tablet by mouth 2 (two) times daily with a meal.     Yes Historical Provider, MD  metFORMIN (GLUCOPHAGE) 500 MG tablet Take 500 mg by mouth daily with breakfast.     Yes  Historical Provider, MD  NIFEdipine (PROCARDIA XL/ADALAT-CC) 30 MG 24 hr tablet Take 30 mg by mouth daily.  06/07/11  Yes Historical Provider, MD  omeprazole (PRILOSEC) 20 MG capsule Take 20 mg by mouth daily.     Yes Historical Provider, MD  pravastatin (PRAVACHOL) 40 MG tablet Take 40 mg by mouth daily. Take 2 tablets daily    Yes Historical Provider, MD  sitaGLIPtin (JANUVIA) 100 MG tablet Take 100 mg by mouth daily.     Yes Historical Provider, MD  tiotropium (SPIRIVA) 18 MCG inhalation capsule Place 18 mcg into inhaler and inhale daily.     Yes Historical Provider, MD  valsartan (DIOVAN) 160 MG tablet Take 160 mg by mouth daily.     Yes Historical Provider, MD  budesonide-formoterol (SYMBICORT) 160-4.5 MCG/ACT inhaler Inhale 2 puffs into the lungs 2 (two) times daily.      Historical Provider, MD    Allergies as of 02/28/2012 - Review Complete 02/28/2012  Allergen Reaction Noted  . Meloxicam  10/18/2010    No family history on file.  History   Social History  . Marital Status: Married    Spouse Name: N/A    Number of Children: N/A  . Years of Education: N/A   Occupational History  . Not on file.   Social History Main Topics  . Smoking status: Former Smoker    Quit date: 09/16/1989  . Smokeless tobacco: Never Used  . Alcohol Use: No  . Drug Use: No  . Sexually Active:  Other Topics Concern  . Not on file   Social History Narrative  . No narrative on file    Review of Systems: See HPI, otherwise negative ROS  Physical Exam: Pulse 100  Temp 98.1 F (36.7 C) (Temporal)  Ht 5\' 8"  (1.727 m)  Wt 215 lb 9.6 oz (97.796 kg)  BMI 32.78 kg/m2 General:   Alert,  Well-developed, well-nourished, pleasant and cooperative in NAD Skin:  Intact without significant lesions or rashes. Eyes:  Sclera clear, no icterus.   Conjunctiva pink. Ears:  Normal auditory acuity. Nose:  No deformity, discharge,  or lesions. Mouth:  No deformity or lesions. Neck:  Supple; no masses or  thyromegaly. No significant cervical adenopathy. Lungs:  Clear throughout to auscultation.   No wheezes, crackles, or rhonchi. No acute distress. Heart:  Regular rate and rhythm; no murmurs, clicks, rubs,  or gallops. Abdomen: Non-distended, normal bowel sounds.  Soft and nontender without appreciable mass or hepatosplenomegaly.  Pulses:  Normal pulses noted. Extremities:  Without clubbing or edema.  Prosthetic right lower extremity-status post BKA  Impression/Plan: Very pleasant 66 year old lady with a history of colonic adenomas now due for surveillance colonoscopy. She really doesn't have any bowel symptoms aside from a loose stool in the morning after breakfast (after 2 cups of coffee). No diarrhea at other times.The risks, benefits, limitations, alternatives and imponderables have been reviewed with the patient. Questions have been answered. All parties are agreeable.   I recommended she might to cut back on her coffee or trial of Imodium AD either the first thing in the morning or at bedtime if episodic postprandial diarrhea becomes more of the aggravation; I do not feel that further evaluation of this complaint is warranted at this time.   GERD symptoms well controlled on Prilosec 20 mg daily. I feel the benefits of chronic therapy outweigh any theoretical risks at this time. Further recommendations to follow.

## 2012-03-25 NOTE — Interval H&P Note (Signed)
History and Physical Interval Note:  03/25/2012 9:59 AM  Debbie [name]  has presented today for surgery, with the diagnosis of history of colon polyps  The various methods of treatment have been discussed with the patient and family. After consideration of risks, benefits and other options for treatment, the patient has consented to  Procedure(s) (LRB): COLONOSCOPY (N/A) as a surgical intervention .  The patient's history has been reviewed, patient examined, no change in status, stable for surgery.  I have reviewed the patients' chart and labs.  Questions were answered to the patient's satisfaction.     Debbie [name]  One Imodium at night has essentially alleviated her diarrhea per patient. Colonoscopy today per plan.

## 2012-03-25 NOTE — Op Note (Signed)
[facility] [address] [place], [place]  [address]  COLONOSCOPY PROCEDURE REPORT  PATIENT:  Debbie [name], Debbie [name]  MR#:  [id number] BIRTHDATE:  1945-11-28, 66 yrs. old  GENDER:  female ENDOSCOPIST:  R. Roetta Sessions, MD FACP [facility] REF. BY:  Joette Catching, M.D. PROCEDURE DATE:  03/25/2012 PROCEDURE:  Colonoscopy with snare polypectomy  INDICATIONS:  History of colonic adenoma; surveillance examination  INFORMED CONSENT:  The risks, benefits, alternatives and imponderables including but not limited to bleeding, perforation as well as the possibility of a missed lesion have been reviewed. The potential for biopsy, lesion removal, etc. have also been discussed.  Questions have been answered.  All parties agreeable. Please see the history and physical in the medical record for more information.  MEDICATIONS:  Versed 5 mg IV and Demerol 100 mg IV in divided doses.  DESCRIPTION OF PROCEDURE:  After a digital rectal exam was performed, the EC-[id number] ([id number]) colonoscope was advanced from the anus through the rectum and colon to the area of the cecum, ileocecal valve and appendiceal orifice.  The cecum was deeply intubated.  These structures were well-seen and photographed for the record.  From the level of the cecum and ileocecal valve, the scope was slowly and cautiously withdrawn.  The mucosal surfaces were carefully surveyed utilizing scope tip deflection to facilitate fold flattening as needed.  The scope was pulled down into the rectum where a thorough examination including retroflexion was performed. <<PROCEDUREIMAGES>>  FINDINGS: Good preparation. Normal rectum. Shallow mouth scattered left-sided diverticula; single 4 mm polyp in the mid descending segment; otherwise, the remainder of the colonic mucosa appeared normal.  THERAPEUTIC / DIAGNOSTIC MANEUVERS PERFORMED: The above-mentioned polyp was cold snare removed  COMPLICATIONS:  None  CECAL WITHDRAWAL TIME:  10 minutes  IMPRESSION: Colonic diverticulosis. Single descending colon polyp-removed as described above.  RECOMMENDATIONS: Followup on pathology.  ______________________________ R. Roetta Sessions, MD Caleen Essex  CC:  Joette Catching, M.D.  n. eSIGNED:   R. Roetta Sessions at 03/25/2012 10:35 AM  Jayme Cloud, [phone]

## 2012-03-30 ENCOUNTER — Encounter (HOSPITAL_COMMUNITY): Payer: Self-pay | Admitting: Internal Medicine

## 2012-04-14 ENCOUNTER — Encounter: Payer: Self-pay | Admitting: Internal Medicine

## 2012-04-15 ENCOUNTER — Encounter: Payer: Self-pay | Admitting: *Deleted

## 2014-04-01 ENCOUNTER — Encounter (INDEPENDENT_AMBULATORY_CARE_PROVIDER_SITE_OTHER): Payer: Self-pay

## 2014-04-01 ENCOUNTER — Ambulatory Visit (INDEPENDENT_AMBULATORY_CARE_PROVIDER_SITE_OTHER)
Admission: RE | Admit: 2014-04-01 | Discharge: 2014-04-01 | Disposition: A | Discharge status: Home / Self Care | Payer: Medicare Other | Source: Ambulatory Visit | Attending: Pulmonary Disease | Admitting: Pulmonary Disease

## 2014-04-01 ENCOUNTER — Encounter: Payer: Self-pay | Admitting: Pulmonary Disease

## 2014-04-01 ENCOUNTER — Ambulatory Visit (INDEPENDENT_AMBULATORY_CARE_PROVIDER_SITE_OTHER): Payer: Medicare Other | Admitting: Pulmonary Disease

## 2014-04-01 VITALS — BP 150/90 | HR 78 | Temp 98.4°F | Ht 69.0 in | Wt 218.6 lb

## 2014-04-01 DIAGNOSIS — R0989 Other specified symptoms and signs involving the circulatory and respiratory systems: Secondary | ICD-10-CM

## 2014-04-01 DIAGNOSIS — R0609 Other forms of dyspnea: Secondary | ICD-10-CM | POA: Insufficient documentation

## 2014-04-01 NOTE — Assessment & Plan Note (Signed)
The patient has worsening dyspnea on exertion over the last 8 months that I suspect is multifactorial. She is obese with deconditioning and debility, and may have underlying obstructive disease related to her past smoking history. She has had PFTs in the past, but those are not available currently. We also need to consider the possibility of underlying cardiac disease. She does desaturate with walking today, but only minimally. I would like to do overnight oximetry prior to starting her on oxygen on a regular basis. I have asked her to continue on Symbicort for now, and will schedule for pulmonary function studies so we can see what contribution underlying lung disease may have toward her overall dyspnea on exertion. If her PFTs are not to overly impressive, she may need a cardiac workup as well for evaluation. Will also check a chest x-ray today for completeness.

## 2014-04-01 NOTE — Patient Instructions (Addendum)
Will check chest xray today, and let you know the results. Will schedule for breathing studies, and see you back the same day for review. Stay on symbicort for now until I see you back. Will check your oxygen level overnight while sleeping.  Will start you on oxygen with exertion once we get your overnight study back.

## 2014-04-01 NOTE — Progress Notes (Signed)
   Subjective:    Patient ID: Debbie [name], female    DOB: 1945-11-08, 68 y.o.   MRN: [id number]  HPI The patient is a 68 year old female who I've been asked to see for dyspnea on exertion. She has a history of "COPD" in the past, but has not had pulmonary function studies since 2011. She has a history of abnormal lung densities that were feared to be bronchogenic cancer, but resolve on their own without intervention. She gives an 8 month history of progressive shortness of breath, and is greatly limited in her activities. She describes a less than one block dyspnea on exertion at a moderate pace, and will get winded doing very light housework or bringing groceries in from the car. She also is obese, and clearly deconditioned with unsteadiness on her feet. She has a history of smoking for only 18 years, but has been quit for quite some time. She has not had a recent chest x-ray. She describes a mild dry cough that only occurs with prolonged conversation, otherwise it is unremarkable. She denies significant lower extremity edema. She has not had a recent cardiac workup. The patient states that her weight has been neutral over the last one year. She currently is on Symbicort, and tells me that she started this approximately 3 weeks ago. She thinks it does help her breathing to some degree in the mornings. She has been on supplemental oxygen in the past, but that has been years ago.   Review of Systems  Constitutional: Negative for fever and unexpected weight change.  HENT: Positive for congestion. Negative for dental problem, ear pain, nosebleeds, postnasal drip, rhinorrhea, sinus pressure, sneezing, sore throat and trouble swallowing.   Eyes: Negative for redness and itching.  Respiratory: Positive for cough, chest tightness and shortness of breath. Negative for wheezing.   Cardiovascular: Negative for palpitations and leg swelling.  Gastrointestinal: Negative for nausea and vomiting.    Genitourinary: Negative for dysuria.  Musculoskeletal: Negative for joint swelling.  Skin: Negative for rash.  Neurological: Positive for headaches.  Hematological: Does not bruise/bleed easily.  Psychiatric/Behavioral: Negative for dysphoric mood. The patient is not nervous/anxious.        Objective:   Physical Exam Constitutional:  Morbidly obese female, no acute distress  HENT:  Nares patent without discharge  Oropharynx without exudate, palate and uvula are normal  Eyes:  Perrla, eomi, no scleral icterus  Neck:  No JVD, no TMG  Cardiovascular:  Normal rate, regular rhythm, no rubs or gallops.  No murmurs        Intact distal pulses but diminished  Pulmonary :  Normal breath sounds, no stridor or respiratory distress   No rales, rhonchi, or wheezing  Abdominal:  Soft, nondistended, bowel sounds present.  No tenderness noted.   Musculoskeletal:  mild lower extremity edema on the left, prosthesis on the right  Lymph Nodes:  No cervical lymphadenopathy noted  Skin:  No cyanosis noted  Neurologic:  Alert, appropriate, moves all 4 extremities without obvious deficit.         Assessment & Plan:

## 2014-04-05 ENCOUNTER — Telehealth: Payer: Self-pay | Admitting: Pulmonary Disease

## 2014-04-05 DIAGNOSIS — R0609 Other forms of dyspnea: Principal | ICD-10-CM

## 2014-04-05 NOTE — Telephone Encounter (Signed)
Order has been placed thanks

## 2014-04-05 NOTE — Telephone Encounter (Signed)
Put in order for ono thanks Joellen Jersey

## 2014-04-05 NOTE — Telephone Encounter (Signed)
Per order 04/01/14: Note Needs ONO on room air for one night. Send report to me --  Pt reports this was sent to [employer] and is not contracted with medicare. Needs order sent elsewhere. Please advise PCC's thanks

## 2014-04-06 ENCOUNTER — Telehealth: Payer: Self-pay | Admitting: Pulmonary Disease

## 2014-04-06 NOTE — Telephone Encounter (Signed)
Called and spoke with Danae Chen (who is from [place]) not [facility]. Order from yesterday ONO on room air was faxed to [facility] to arrange. [facility] has order and is in process of scheduling with patient.  Gae Gallop from [place] that we received this message twice from them and that patient has already been sent to another DME to provide service. Nothing else needed at this time. Rhonda J Cobb

## 2014-04-06 NOTE — Telephone Encounter (Signed)
PCC's please advise which DME we can send this order to.  thanks

## 2014-04-08 ENCOUNTER — Telehealth: Payer: Self-pay | Admitting: Pulmonary Disease

## 2014-04-11 NOTE — Telephone Encounter (Signed)
Looks like order was sent to [facility]. Called [facility] [phone]. Spoke with Maudie Mercury. She reports they have order and will call her today.  Called made pt aware. Nothing further needed

## 2014-04-25 ENCOUNTER — Encounter: Payer: Self-pay | Admitting: Pulmonary Disease

## 2014-04-25 ENCOUNTER — Other Ambulatory Visit (INDEPENDENT_AMBULATORY_CARE_PROVIDER_SITE_OTHER): Payer: Medicare Other

## 2014-04-25 ENCOUNTER — Ambulatory Visit (INDEPENDENT_AMBULATORY_CARE_PROVIDER_SITE_OTHER): Payer: Medicare Other | Admitting: Pulmonary Disease

## 2014-04-25 VITALS — BP 140/90 | HR 84 | Temp 97.9°F | Ht 65.0 in | Wt 219.0 lb

## 2014-04-25 DIAGNOSIS — E119 Type 2 diabetes mellitus without complications: Secondary | ICD-10-CM

## 2014-04-25 DIAGNOSIS — R0989 Other specified symptoms and signs involving the circulatory and respiratory systems: Secondary | ICD-10-CM

## 2014-04-25 DIAGNOSIS — R0609 Other forms of dyspnea: Secondary | ICD-10-CM

## 2014-04-25 DIAGNOSIS — R0902 Hypoxemia: Secondary | ICD-10-CM

## 2014-04-25 DIAGNOSIS — J9610 Chronic respiratory failure, unspecified whether with hypoxia or hypercapnia: Secondary | ICD-10-CM

## 2014-04-25 DIAGNOSIS — J9611 Chronic respiratory failure with hypoxia: Secondary | ICD-10-CM

## 2014-04-25 LAB — BASIC METABOLIC PANEL
BUN: 14 mg/dL (ref 6–23)
CHLORIDE: 105 meq/L (ref 96–112)
CO2: 25 meq/L (ref 19–32)
CREATININE: 0.8 mg/dL (ref 0.4–1.2)
Calcium: 9.2 mg/dL (ref 8.4–10.5)
GFR: 79.13 mL/min (ref >60.00)
GLUCOSE: 140 mg/dL — AB (ref 70–99)
POTASSIUM: 3.6 meq/L (ref 3.5–5.1)
Sodium: 139 mEq/L (ref 135–145)

## 2014-04-25 NOTE — Patient Instructions (Signed)
Would discontinue symbicort as a trial to see if you see a difference. Will arrange for oxygen at night while sleeping, and also during the day with activities.  Do not need at rest when awake. Will schedule for scan of your chest to look for blood clots, and also echo to evaluate your heart.  Will call once the results are available.

## 2014-04-25 NOTE — Progress Notes (Signed)
   Subjective:    Patient ID: Debbie [name], female    DOB: 07/05/1946, 68 y.o.   MRN: [id number]  HPI The patient comes in today for followup of her recent PFTs in overnight oximetry. She had oxygen desaturation as low as 72% overnight, with almost 5 hours spent less than 89%. She had PFTs which surprisingly showed no airflow obstruction, no restriction, and a moderately to severely reduced diffusion capacity. I have reviewed the study with her in detail, and answered all of her questions.   Review of Systems  Constitutional: Negative for fever and unexpected weight change.  HENT: Negative for congestion, dental problem, ear pain, nosebleeds, postnasal drip, rhinorrhea, sinus pressure, sneezing, sore throat and trouble swallowing.   Eyes: Negative for redness and itching.  Respiratory: Positive for shortness of breath. Negative for cough, chest tightness and wheezing.   Cardiovascular: Negative for palpitations and leg swelling.  Gastrointestinal: Negative for nausea and vomiting.  Genitourinary: Negative for dysuria.  Musculoskeletal: Negative for joint swelling.  Skin: Negative for rash.  Neurological: Positive for dizziness. Negative for headaches.  Hematological: Does not bruise/bleed easily.  Psychiatric/Behavioral: Negative for dysphoric mood. The patient is not nervous/anxious.        Objective:   Physical Exam Overweight female in no acute distress Nose without purulence or discharge noted Neck without lymphadenopathy or thyromegaly Chest with a few basilar crackles, no wheezing Cardiac exam with regular rate and rhythm Lower extremities with 1-2+ edema, no calf tenderness Alert and oriented, moves all 4 extremities.       Assessment & Plan:

## 2014-04-25 NOTE — Assessment & Plan Note (Signed)
The patient has severe dyspnea on exertion that is not related to COPD. Her PFTs today showed no airflow obstruction, and she does not have any restriction or air-trapping as well. Her diffusion capacity is moderately to severely reduced, and this could possibly be related to underlying parenchymal disease, pulmonary vascular disease, or possibly related to cardiac disease. I think she needs to have a CT of her chest to rule out a pulmonary embolus, and also to evaluate for parenchymal lung disease. She will need an echocardiogram to estimate pulmonary artery pressures, and to also look at her ejection fraction and valves. I will go ahead and start her on supplemental oxygen with exertional activities, as well as sleep. Since she did not have airflow obstruction on her PFTs, I have asked her to discontinue Symbicort as a trial.

## 2014-04-25 NOTE — Progress Notes (Signed)
PFT done today. 

## 2014-04-26 ENCOUNTER — Ambulatory Visit (INDEPENDENT_AMBULATORY_CARE_PROVIDER_SITE_OTHER)
Admission: RE | Admit: 2014-04-26 | Discharge: 2014-04-26 | Disposition: A | Discharge status: Home / Self Care | Payer: Medicare Other | Source: Ambulatory Visit | Attending: Pulmonary Disease | Admitting: Pulmonary Disease

## 2014-04-26 DIAGNOSIS — J9611 Chronic respiratory failure with hypoxia: Secondary | ICD-10-CM

## 2014-04-26 DIAGNOSIS — R0609 Other forms of dyspnea: Secondary | ICD-10-CM

## 2014-04-26 DIAGNOSIS — R0902 Hypoxemia: Secondary | ICD-10-CM

## 2014-04-26 DIAGNOSIS — R0989 Other specified symptoms and signs involving the circulatory and respiratory systems: Secondary | ICD-10-CM

## 2014-04-26 DIAGNOSIS — J9610 Chronic respiratory failure, unspecified whether with hypoxia or hypercapnia: Secondary | ICD-10-CM

## 2014-04-26 MED ORDER — IOHEXOL 350 MG/ML SOLN
80.0000 mL | Freq: Once | INTRAVENOUS | Status: AC | PRN
  Administered 2014-04-26: 80 mL via INTRAVENOUS

## 2014-04-28 ENCOUNTER — Ambulatory Visit (HOSPITAL_COMMUNITY)
Admission: RE | Admit: 2014-04-28 | Discharge: 2014-04-28 | Disposition: A | Discharge status: Home / Self Care | Payer: Medicare Other | Source: Ambulatory Visit | Attending: Cardiovascular Disease | Admitting: Cardiovascular Disease

## 2014-04-28 DIAGNOSIS — J9610 Chronic respiratory failure, unspecified whether with hypoxia or hypercapnia: Secondary | ICD-10-CM | POA: Insufficient documentation

## 2014-04-28 DIAGNOSIS — R0602 Shortness of breath: Secondary | ICD-10-CM

## 2014-04-28 DIAGNOSIS — I10 Essential (primary) hypertension: Secondary | ICD-10-CM | POA: Insufficient documentation

## 2014-04-28 DIAGNOSIS — R0609 Other forms of dyspnea: Secondary | ICD-10-CM | POA: Diagnosis present

## 2014-04-28 DIAGNOSIS — I517 Cardiomegaly: Secondary | ICD-10-CM

## 2014-04-28 DIAGNOSIS — E119 Type 2 diabetes mellitus without complications: Secondary | ICD-10-CM | POA: Diagnosis not present

## 2014-04-28 DIAGNOSIS — Z87891 Personal history of nicotine dependence: Secondary | ICD-10-CM | POA: Diagnosis not present

## 2014-04-28 LAB — PULMONARY FUNCTION TEST
DL/VA % pred: 60 %
DL/VA: 3.02 ml/min/mmHg/L
DLCO unc % pred: 50 %
DLCO unc: 13.35 ml/min/mmHg
FEF 25-75 PRE: 1.39 L/s
FEF 25-75 Post: 1.92 L/sec
FEF2575-%CHANGE-POST: 38 %
FEF2575-%PRED-POST: 93 %
FEF2575-%Pred-Pre: 67 %
FEV1-%CHANGE-POST: 7 %
FEV1-%PRED-POST: 88 %
FEV1-%PRED-PRE: 82 %
FEV1-Post: 2.18 L
FEV1-Pre: 2.03 L
FEV1FVC-%Change-Post: 2 %
FEV1FVC-%Pred-Pre: 95 %
FEV6-%Change-Post: 6 %
FEV6-%PRED-POST: 94 %
FEV6-%Pred-Pre: 88 %
FEV6-POST: 2.92 L
FEV6-PRE: 2.75 L
FEV6FVC-%Change-Post: 0 %
FEV6FVC-%PRED-PRE: 103 %
FEV6FVC-%Pred-Post: 103 %
FVC-%Change-Post: 5 %
FVC-%PRED-PRE: 85 %
FVC-%Pred-Post: 90 %
FVC-POST: 2.93 L
FVC-Pre: 2.78 L
POST FEV1/FVC RATIO: 75 %
POST FEV6/FVC RATIO: 100 %
PRE FEV6/FVC RATIO: 99 %
Pre FEV1/FVC ratio: 73 %
RV % PRED: 80 %
RV: 1.79 L
TLC % PRED: 87 %
TLC: 4.61 L

## 2014-04-28 NOTE — Progress Notes (Signed)
2D Echocardiogram Complete.  04/28/2014   Wanya Bangura, RDCS

## 2014-04-29 ENCOUNTER — Telehealth: Payer: Self-pay | Admitting: Pulmonary Disease

## 2014-04-29 ENCOUNTER — Ambulatory Visit (HOSPITAL_COMMUNITY): Payer: Medicare Other

## 2014-04-29 NOTE — Telephone Encounter (Signed)
Result Note     Let pt know that I see nothing on echo to explain her low blood oxygen level. Everything looks pretty good. Would like to see her back in office to discuss everything, and come up with a plan going forward.      Jenn, please advise how soon [place] wants to see patient back and where to place her on the schedule. I will call patient back with results and appt date/time. Thanks.

## 2014-04-29 NOTE — Telephone Encounter (Signed)
appt set for 05-24-14 [place], [place]

## 2014-05-10 ENCOUNTER — Encounter: Payer: Self-pay | Admitting: Pulmonary Disease

## 2014-05-24 ENCOUNTER — Encounter (INDEPENDENT_AMBULATORY_CARE_PROVIDER_SITE_OTHER): Payer: Self-pay

## 2014-05-24 ENCOUNTER — Ambulatory Visit (INDEPENDENT_AMBULATORY_CARE_PROVIDER_SITE_OTHER): Payer: Medicare Other | Admitting: Pulmonary Disease

## 2014-05-24 ENCOUNTER — Encounter: Payer: Self-pay | Admitting: Pulmonary Disease

## 2014-05-24 VITALS — BP 140/80 | HR 84 | Temp 98.6°F | Ht 67.5 in | Wt 218.0 lb

## 2014-05-24 DIAGNOSIS — R0989 Other specified symptoms and signs involving the circulatory and respiratory systems: Secondary | ICD-10-CM

## 2014-05-24 DIAGNOSIS — R0902 Hypoxemia: Secondary | ICD-10-CM

## 2014-05-24 DIAGNOSIS — J9610 Chronic respiratory failure, unspecified whether with hypoxia or hypercapnia: Secondary | ICD-10-CM

## 2014-05-24 DIAGNOSIS — R0609 Other forms of dyspnea: Secondary | ICD-10-CM

## 2014-05-24 DIAGNOSIS — J9611 Chronic respiratory failure with hypoxia: Secondary | ICD-10-CM

## 2014-05-24 NOTE — Assessment & Plan Note (Signed)
The patient feels that her breathing is much better since being on supplemental oxygen. She has had a fairly extensive pulmonary evaluation, with nothing obvious from a pulmonary standpoint. Her echo was not able to estimate her pulmonary pressures, but there were no obvious findings to sleep just pulmonary hypertension. However, I think she needs a right heart catheterization for a more complete evaluation. This will be able to estimate her left atrial pressures, her pulmonary artery pressures, and also to make sure she does not have some type of shunt physiology. If she is indeed found to have pulmonary hypertension, she will need a ventilation perfusion scan (ct angio negative but does not exclude chronic TE disease) and a sleep study. The lower range for followup once her cardiac evaluation is complete.

## 2014-05-24 NOTE — Progress Notes (Signed)
   Subjective:    Patient ID: Debbie [name], female    DOB: 1946-05-23, 68 y.o.   MRN: [id number]  HPI The patient comes in today for followup of her known dyspnea on exertion and chronic hypoxemic respiratory failure. She was started on oxygen at the last visit, and has seen significant improvement with her breathing. Her pulmonary function studies showed no airflow obstruction, no restriction, but a moderate to severe reduction in her diffusion capacity. Her echocardiogram showed normal LV function, some degree of diastolic dysfunction, but was unable to estimate her pulmonary artery pressures. The patient had a CT chest that showed no pulmonary embolus, no interstitial lung disease, and some mild dependent atelectasis associated with her obesity. I have reviewed all of her testing with her in detail, and answered all of her questions.   Review of Systems  Constitutional: Negative for fever and unexpected weight change.  HENT: Negative for congestion, dental problem, ear pain, nosebleeds, postnasal drip, rhinorrhea, sinus pressure, sneezing, sore throat and trouble swallowing.   Eyes: Negative for redness and itching.  Respiratory: Positive for shortness of breath. Negative for cough, chest tightness and wheezing.   Cardiovascular: Negative for palpitations and leg swelling.  Gastrointestinal: Negative for nausea and vomiting.  Genitourinary: Negative for dysuria.  Musculoskeletal: Negative for joint swelling.  Skin: Negative for rash.  Neurological: Negative for headaches.  Hematological: Does not bruise/bleed easily.  Psychiatric/Behavioral: Negative for dysphoric mood. The patient is not nervous/anxious.        Objective:   Physical Exam Obese female in no acute distress Nose without purulence or discharge noted Neck without lymphadenopathy or thyromegaly Chest with a few basilar crackles, otherwise clear Cardiac exam with regular rate and rhythm Lower extremities with mild  edema, no cyanosis Alert and oriented, moves all 4 extremities.       Assessment & Plan:

## 2014-05-24 NOTE — Assessment & Plan Note (Signed)
The patient had normal PFTs except for a diffusion capacity that was 50% of predicted. With an unremarkable CT chest, but this is most consistent with either pulmonary vascular disease or some type of cardiac issue. The patient did not see any difference being off a bronchodilator, not surprising since she does not have airflow obstruction. I've also encouraged her to work aggressively on weight loss.

## 2014-05-24 NOTE — Patient Instructions (Signed)
Will refer to cardiology to consider evaluation of the pressure in the blood vessels in the lungs.   Work on [employer] loss Will arrange followup with me after seeing cardiologist.

## 2014-06-14 ENCOUNTER — Encounter: Payer: Self-pay | Admitting: *Deleted

## 2014-06-17 ENCOUNTER — Encounter: Payer: Self-pay | Admitting: *Deleted

## 2014-06-17 ENCOUNTER — Ambulatory Visit (INDEPENDENT_AMBULATORY_CARE_PROVIDER_SITE_OTHER): Payer: Medicare Other | Admitting: Cardiology

## 2014-06-17 ENCOUNTER — Encounter: Payer: Self-pay | Admitting: Cardiology

## 2014-06-17 ENCOUNTER — Encounter (HOSPITAL_COMMUNITY): Payer: Self-pay | Admitting: Pharmacy Technician

## 2014-06-17 VITALS — BP 150/102 | HR 75 | Ht 67.5 in | Wt 219.0 lb

## 2014-06-17 DIAGNOSIS — I731 Thromboangiitis obliterans [Buerger's disease]: Secondary | ICD-10-CM

## 2014-06-17 DIAGNOSIS — R0602 Shortness of breath: Secondary | ICD-10-CM

## 2014-06-17 DIAGNOSIS — I739 Peripheral vascular disease, unspecified: Secondary | ICD-10-CM

## 2014-06-17 DIAGNOSIS — I272 Pulmonary hypertension, unspecified: Secondary | ICD-10-CM

## 2014-06-17 DIAGNOSIS — I270 Primary pulmonary hypertension: Secondary | ICD-10-CM

## 2014-06-17 DIAGNOSIS — J9611 Chronic respiratory failure with hypoxia: Secondary | ICD-10-CM

## 2014-06-17 DIAGNOSIS — E785 Hyperlipidemia, unspecified: Secondary | ICD-10-CM

## 2014-06-17 DIAGNOSIS — I10 Essential (primary) hypertension: Secondary | ICD-10-CM

## 2014-06-17 LAB — BASIC METABOLIC PANEL
BUN: 17 mg/dL (ref 6–23)
CO2: 28 mEq/L (ref 19–32)
Calcium: 9.4 mg/dL (ref 8.4–10.5)
Chloride: 102 mEq/L (ref 96–112)
Creatinine, Ser: 0.6 mg/dL (ref 0.4–1.2)
GFR: 107.55 mL/min (ref >60.00)
GLUCOSE: 108 mg/dL — AB (ref 70–99)
POTASSIUM: 4.2 meq/L (ref 3.5–5.1)
Sodium: 137 mEq/L (ref 135–145)

## 2014-06-17 LAB — BRAIN NATRIURETIC PEPTIDE: PRO B NATRI PEPTIDE: 28 pg/mL (ref 0.0–100.0)

## 2014-06-17 MED ORDER — ROSUVASTATIN CALCIUM 5 MG PO TABS: ORAL_TABLET | ORAL | Status: DC

## 2014-06-17 MED ORDER — VALSARTAN 80 MG PO TABS: 80.0000 mg | ORAL_TABLET | Freq: Every day | ORAL | Status: DC

## 2014-06-17 NOTE — Patient Instructions (Addendum)
Increase valsartan to 240mg  daily.  You will need to take 1 of a 160mg  valsartan tablet and 1 of a 80mg  valsartan tablet daily at the same time.  Start crestor 5mg  every other day.  You are scheduled for a right heart catheterization on Wednesday October 7,2015. See instruction sheet.   Your physician recommends that you return for lab work today--BMET/BNP.  Your physician recommends that you return for lab work in: 2 weeks--BMET.  Your physician has requested that you have a lower extremity arterial duplex. This test is an ultrasound of the arteries in the legs or. It looks at arterial blood flow in the legs . Allow one hour for Lower Arterial scans. There are no restrictions or special instructions   Your physician recommends that you schedule a follow-up appointment in: 3 weeks with Dr Aundra Dubin.  This can be scheduled on 07/14/14 at 9:30AM.   Your physician recommends that you return for a FASTING lipid profile /liver profile in 2 months.

## 2014-06-19 NOTE — Progress Notes (Signed)
Patient ID: Debbie [name], female   DOB: July 17, 1946, 68 y.o.   MRN: [id number] PCP: Dr. Edrick Oh Pulmonology: Dr. [name]  68 yo with history of Buerger's disease s/p right BKA, HTN, and type II diabetes presents for evaluation of chronic hypoxemic respiratory failure.  She has had extensive pulmonary workup without definitive diagnosis.  She has had several episodes of PNA in the past . After PNA in 2013, she feels like she never completely recovered.  She is now short of breath after walking about 50 yards and has to ride in a motorized cart at the grocery store.  She uses oxygen with exertion and at night now.  She developed Buerger's disease and had right BKA in 1999.  She quit smoking at that time.  No orthopnea or PND.  PFTs were relatively normal except for low DLCO.  CT chest in 8/15 showed no PE and mild dependent atelectasis.  Echo in 8/15 showed EF 55%, normal-appearing RV.  No estimate of PA systolic pressure could be made. She does not have a definitive pulmonary diagnosis to explain her hypoxemia so was referred to cardiology.  BP is elevated today.  She says that it runs mainly SBP 140s at home.  She used to be on pravastatin but stopped it due to cramping.   Labs (8/15): K 3.6, creatinine 0.8  ECG: NSR, PACs, poor anterior R wave progression  PMH: 1. Type II diabetes 2. GERD 3. Obesity 4. HTN 5. Buerger's disease: s/p right BKA at [facility] in 1990.  6. Recurrent PNA episodes.  7. PFTs (8/15): FVC 85%, FEV1 82%, ratio 95%, TLC 87%, DLCO 50% => near normal except for decreased DLCO.  8. Echo (8/15) with EF 55%, normal RV size and systolic function, mild RAE, unable to estimate PA systolic pressure.   SH: Retired, lives in [place], quit smoking [place].    FH: No heart disease that she knows of.   ROS: All systems reviewed and negative except as per HPI.   Current Outpatient Prescriptions  Medication Sig Dispense Refill  . albuterol (PROVENTIL HFA;VENTOLIN HFA) 108 (90  BASE) MCG/ACT inhaler Inhale 2 puffs into the lungs every 6 (six) hours as needed for shortness of breath.       Marland Kitchen aspirin EC 81 MG tablet Take 81 mg by mouth daily.        . budesonide-formoterol (SYMBICORT) 160-4.5 MCG/ACT inhaler Inhale 2 puffs into the lungs 2 (two) times daily as needed. For Asthma      . Calcium Carb-Cholecalciferol (CALCIUM 1000 + D PO) Take 1,000 mg by mouth daily.      . cetirizine (ZYRTEC) 10 MG tablet Take 10 mg by mouth daily.      . clobetasol cream (TEMOVATE) 2.42 % Apply 1 application topically as needed. for psoriasis      . co-enzyme Q-10 30 MG capsule Take 1 capsule by mouth daily.      Marland Kitchen dexlansoprazole (DEXILANT) 60 MG capsule Take 60 mg by mouth at bedtime.      Marland Kitchen esomeprazole (NEXIUM) 20 MG capsule Take 20 mg by mouth daily.      . fluticasone (FLONASE) 50 MCG/ACT nasal spray Place 1 spray into the nose daily.      Marland Kitchen gabapentin (NEURONTIN) 100 MG capsule Take 100 mg by mouth 3 (three) times daily.      Marland Kitchen ibuprofen (ADVIL,MOTRIN) 200 MG tablet Take 600 mg by mouth every 6 (six) hours as needed. For pain      .  Insulin Glargine (LANTUS SOLOSTAR) 100 UNIT/ML Solostar Pen Inject 40 Units into the skin daily after breakfast.       . NIFEdipine (PROCARDIA XL/ADALAT-CC) 30 MG 24 hr tablet Take 30 mg by mouth 2 (two) times daily.       . SitaGLIPtin-MetFORMIN HCl (JANUMET XR) 50-1000 MG TB24 Take 1 tablet by mouth 2 (two) times daily.       . rosuvastatin (CRESTOR) 5 MG tablet Take 5 mg by mouth every other day.      . valsartan (DIOVAN) 80 MG tablet Take 240 mg by mouth daily.       No current facility-administered medications for this visit.    BP 150/102  Pulse 75  Ht 5' 7.5" (1.715 m)  Wt 219 lb (99.338 kg)  BMI 33.77 kg/m2 General: NAD Neck: No JVD, no thyromegaly or thyroid nodule.  Lungs: Clear to auscultation bilaterally with normal respiratory effort. CV: Nondisplaced PMI.  Heart regular S1/S2, no S3/S4, no murmur.  Trace left ankle edema.  No  carotid bruit.  Unable to palpate left pedal pulses.  Abdomen: Soft, nontender, no hepatosplenomegaly, no distention.  Skin: Intact without lesions or rashes.  Neurologic: Alert and oriented x 3.  Psych: Normal affect. Extremities: Prosthetic right leg HEENT: Normal.   Assessment/Plan: 1. Chronic respiratory failure with hypoxemia: She has had extensive pulmonary workup with no definite diagnosis.  She is now using oxygen at night and with exertion, NYHA class III symptoms.  She is not volume overloaded on exam. Echo with no definitive abnormality except for right atrial enlargement.  PFTs relatively unremarkable except for low DLCO suggesting pulmonary parenchymal disease versus pulmonary vascular disease.  CT chest was relatively unremarkable.  I think that the patient will need a right heart cath to rule out pulmonary HTN or a shunt lesion as a cause for her symptoms and hypoxemia. The alternative would probably be OHS.   - I will arrange for RHC with shunt run.   - Check BNP  2. Hyperlipidemia: Patient has diabetes and Buergers disease.  I think it would be a good idea for her to be on a statin.  She did not tolerate pravastatin due to myalgias.  I will have her try Crestor 5 mg every other day with lipids/LFTs in 2 months.  3. HTN: BP runs high.  I will have her increase valsartan to 240 mg daily with BMET in 2 wks.   4. Buergers disease: She has not had evaluation of this in a while now.  I will get a peripheral arterial doppler evaluation.    Loralie Champagne 06/19/2014

## 2014-06-20 NOTE — Addendum Note (Signed)
Addended by: Katrine Coho on: 06/20/2014 08:18 AM   Modules accepted: Orders

## 2014-06-22 ENCOUNTER — Ambulatory Visit (HOSPITAL_COMMUNITY)
Admission: RE | Admit: 2014-06-22 | Discharge: 2014-06-22 | Disposition: A | Discharge status: Home / Self Care | Payer: Medicare Other | Source: Ambulatory Visit | Attending: Cardiology | Admitting: Cardiology

## 2014-06-22 ENCOUNTER — Encounter (HOSPITAL_COMMUNITY)
Admission: RE | Disposition: A | Discharge status: Home / Self Care | Payer: Self-pay | Source: Ambulatory Visit | Attending: Cardiology

## 2014-06-22 DIAGNOSIS — Z7982 Long term (current) use of aspirin: Secondary | ICD-10-CM | POA: Diagnosis not present

## 2014-06-22 DIAGNOSIS — E119 Type 2 diabetes mellitus without complications: Secondary | ICD-10-CM | POA: Diagnosis not present

## 2014-06-22 DIAGNOSIS — J9611 Chronic respiratory failure with hypoxia: Secondary | ICD-10-CM | POA: Insufficient documentation

## 2014-06-22 DIAGNOSIS — E669 Obesity, unspecified: Secondary | ICD-10-CM | POA: Diagnosis not present

## 2014-06-22 DIAGNOSIS — Z6833 Body mass index (BMI) 33.0-33.9, adult: Secondary | ICD-10-CM | POA: Diagnosis not present

## 2014-06-22 DIAGNOSIS — Z89511 Acquired absence of right leg below knee: Secondary | ICD-10-CM | POA: Insufficient documentation

## 2014-06-22 DIAGNOSIS — I270 Primary pulmonary hypertension: Secondary | ICD-10-CM

## 2014-06-22 DIAGNOSIS — Z794 Long term (current) use of insulin: Secondary | ICD-10-CM | POA: Diagnosis not present

## 2014-06-22 DIAGNOSIS — I272 Other secondary pulmonary hypertension: Secondary | ICD-10-CM | POA: Insufficient documentation

## 2014-06-22 DIAGNOSIS — K219 Gastro-esophageal reflux disease without esophagitis: Secondary | ICD-10-CM | POA: Diagnosis not present

## 2014-06-22 DIAGNOSIS — I731 Thromboangiitis obliterans [Buerger's disease]: Secondary | ICD-10-CM | POA: Diagnosis not present

## 2014-06-22 DIAGNOSIS — Z87891 Personal history of nicotine dependence: Secondary | ICD-10-CM | POA: Insufficient documentation

## 2014-06-22 HISTORY — PX: RIGHT HEART CATHETERIZATION: SHX5447

## 2014-06-22 LAB — POCT I-STAT 3, VENOUS BLOOD GAS (G3P V)
Acid-Base Excess: 2 mmol/L (ref 0.0–2.0)
Bicarbonate: 28.8 mEq/L — ABNORMAL HIGH (ref 20.0–24.0)
O2 SAT: 75 %
PH VEN: 7.357 — AB (ref 7.250–7.300)
TCO2: 30 mmol/L (ref 0–100)
pCO2, Ven: 51.4 mmHg — ABNORMAL HIGH (ref 45.0–50.0)
pO2, Ven: 43 mmHg (ref 30.0–45.0)

## 2014-06-22 LAB — CBC
HEMATOCRIT: 40.1 % (ref 36.0–46.0)
HEMOGLOBIN: 13.6 g/dL (ref 12.0–15.0)
MCH: 28.9 pg (ref 26.0–34.0)
MCHC: 33.9 g/dL (ref 30.0–36.0)
MCV: 85.3 fL (ref 78.0–100.0)
Platelets: 267 10*3/uL (ref 150–400)
RBC: 4.7 MIL/uL (ref 3.87–5.11)
RDW: 13.6 % (ref 11.5–15.5)
WBC: 9.5 10*3/uL (ref 4.0–10.5)

## 2014-06-22 LAB — GLUCOSE, CAPILLARY
Glucose-Capillary: 124 mg/dL — ABNORMAL HIGH (ref 70–99)
Glucose-Capillary: 86 mg/dL (ref 70–99)

## 2014-06-22 LAB — PROTIME-INR
INR: 1.04 (ref 0.00–1.49)
Prothrombin Time: 13.6 seconds (ref 11.6–15.2)

## 2014-06-22 SURGERY — RIGHT HEART CATH
Anesthesia: LOCAL

## 2014-06-22 MED ORDER — ASPIRIN 81 MG PO CHEW
CHEWABLE_TABLET | ORAL | Status: AC
  Filled 2014-06-22: qty 1

## 2014-06-22 MED ORDER — SODIUM CHLORIDE 0.9 % IV SOLN
INTRAVENOUS | Status: DC
  Administered 2014-06-22: 11:00:00 via INTRAVENOUS

## 2014-06-22 MED ORDER — SODIUM CHLORIDE 0.9 % IV SOLN: 250.0000 mL | INTRAVENOUS | Status: DC | PRN

## 2014-06-22 MED ORDER — SODIUM CHLORIDE 0.9 % IJ SOLN: 3.0000 mL | INTRAMUSCULAR | Status: DC | PRN

## 2014-06-22 MED ORDER — HEPARIN (PORCINE) IN NACL 2-0.9 UNIT/ML-% IJ SOLN
INTRAMUSCULAR | Status: AC
  Filled 2014-06-22: qty 500

## 2014-06-22 MED ORDER — FENTANYL CITRATE 0.05 MG/ML IJ SOLN
INTRAMUSCULAR | Status: AC
  Filled 2014-06-22: qty 2

## 2014-06-22 MED ORDER — MIDAZOLAM HCL 2 MG/2ML IJ SOLN
INTRAMUSCULAR | Status: AC
  Filled 2014-06-22: qty 2

## 2014-06-22 MED ORDER — SODIUM CHLORIDE 0.9 % IJ SOLN: 3.0000 mL | Freq: Two times a day (BID) | INTRAMUSCULAR | Status: DC

## 2014-06-22 MED ORDER — ACETAMINOPHEN 325 MG PO TABS: 650.0000 mg | ORAL_TABLET | ORAL | Status: DC | PRN

## 2014-06-22 MED ORDER — ONDANSETRON HCL 4 MG/2ML IJ SOLN: 4.0000 mg | Freq: Four times a day (QID) | INTRAMUSCULAR | Status: DC | PRN

## 2014-06-22 MED ORDER — ASPIRIN 81 MG PO CHEW
81.0000 mg | CHEWABLE_TABLET | ORAL | Status: AC
  Administered 2014-06-22: 81 mg via ORAL

## 2014-06-22 MED ORDER — LIDOCAINE HCL (PF) 1 % IJ SOLN
INTRAMUSCULAR | Status: AC
  Filled 2014-06-22: qty 30

## 2014-06-22 NOTE — H&P (View-Only) (Signed)
Patient ID: Debbie [name], female   DOB: 01/23/1946, 68 y.o.   MRN: [id number] PCP: Dr. Edrick Oh Pulmonology: Dr. [name]  68 yo with history of Buerger's disease s/p right BKA, HTN, and type II diabetes presents for evaluation of chronic hypoxemic respiratory failure.  She has had extensive pulmonary workup without definitive diagnosis.  She has had several episodes of PNA in the past . After PNA in 2013, she feels like she never completely recovered.  She is now short of breath after walking about 50 yards and has to ride in a motorized cart at the grocery store.  She uses oxygen with exertion and at night now.  She developed Buerger's disease and had right BKA in 1999.  She quit smoking at that time.  No orthopnea or PND.  PFTs were relatively normal except for low DLCO.  CT chest in 8/15 showed no PE and mild dependent atelectasis.  Echo in 8/15 showed EF 55%, normal-appearing RV.  No estimate of PA systolic pressure could be made. She does not have a definitive pulmonary diagnosis to explain her hypoxemia so was referred to cardiology.  BP is elevated today.  She says that it runs mainly SBP 140s at home.  She used to be on pravastatin but stopped it due to cramping.   Labs (8/15): K 3.6, creatinine 0.8  ECG: NSR, PACs, poor anterior R wave progression  PMH: 1. Type II diabetes 2. GERD 3. Obesity 4. HTN 5. Buerger's disease: s/p right BKA at [facility] in 1990.  6. Recurrent PNA episodes.  7. PFTs (8/15): FVC 85%, FEV1 82%, ratio 95%, TLC 87%, DLCO 50% => near normal except for decreased DLCO.  8. Echo (8/15) with EF 55%, normal RV size and systolic function, mild RAE, unable to estimate PA systolic pressure.   SH: Retired, lives in [place], quit smoking [place].    FH: No heart disease that she knows of.   ROS: All systems reviewed and negative except as per HPI.   Current Outpatient Prescriptions  Medication Sig Dispense Refill  . albuterol (PROVENTIL HFA;VENTOLIN HFA) 108 (90  BASE) MCG/ACT inhaler Inhale 2 puffs into the lungs every 6 (six) hours as needed for shortness of breath.       Marland Kitchen aspirin EC 81 MG tablet Take 81 mg by mouth daily.        . budesonide-formoterol (SYMBICORT) 160-4.5 MCG/ACT inhaler Inhale 2 puffs into the lungs 2 (two) times daily as needed. For Asthma      . Calcium Carb-Cholecalciferol (CALCIUM 1000 + D PO) Take 1,000 mg by mouth daily.      . cetirizine (ZYRTEC) 10 MG tablet Take 10 mg by mouth daily.      . clobetasol cream (TEMOVATE) 5.85 % Apply 1 application topically as needed. for psoriasis      . co-enzyme Q-10 30 MG capsule Take 1 capsule by mouth daily.      Marland Kitchen dexlansoprazole (DEXILANT) 60 MG capsule Take 60 mg by mouth at bedtime.      Marland Kitchen esomeprazole (NEXIUM) 20 MG capsule Take 20 mg by mouth daily.      . fluticasone (FLONASE) 50 MCG/ACT nasal spray Place 1 spray into the nose daily.      Marland Kitchen gabapentin (NEURONTIN) 100 MG capsule Take 100 mg by mouth 3 (three) times daily.      Marland Kitchen ibuprofen (ADVIL,MOTRIN) 200 MG tablet Take 600 mg by mouth every 6 (six) hours as needed. For pain      .  Insulin Glargine (LANTUS SOLOSTAR) 100 UNIT/ML Solostar Pen Inject 40 Units into the skin daily after breakfast.       . NIFEdipine (PROCARDIA XL/ADALAT-CC) 30 MG 24 hr tablet Take 30 mg by mouth 2 (two) times daily.       . SitaGLIPtin-MetFORMIN HCl (JANUMET XR) 50-1000 MG TB24 Take 1 tablet by mouth 2 (two) times daily.       . rosuvastatin (CRESTOR) 5 MG tablet Take 5 mg by mouth every other day.      . valsartan (DIOVAN) 80 MG tablet Take 240 mg by mouth daily.       No current facility-administered medications for this visit.    BP 150/102  Pulse 75  Ht 5' 7.5" (1.715 m)  Wt 219 lb (99.338 kg)  BMI 33.77 kg/m2 General: NAD Neck: No JVD, no thyromegaly or thyroid nodule.  Lungs: Clear to auscultation bilaterally with normal respiratory effort. CV: Nondisplaced PMI.  Heart regular S1/S2, no S3/S4, no murmur.  Trace left ankle edema.  No  carotid bruit.  Unable to palpate left pedal pulses.  Abdomen: Soft, nontender, no hepatosplenomegaly, no distention.  Skin: Intact without lesions or rashes.  Neurologic: Alert and oriented x 3.  Psych: Normal affect. Extremities: Prosthetic right leg HEENT: Normal.   Assessment/Plan: 1. Chronic respiratory failure with hypoxemia: She has had extensive pulmonary workup with no definite diagnosis.  She is now using oxygen at night and with exertion, NYHA class III symptoms.  She is not volume overloaded on exam. Echo with no definitive abnormality except for right atrial enlargement.  PFTs relatively unremarkable except for low DLCO suggesting pulmonary parenchymal disease versus pulmonary vascular disease.  CT chest was relatively unremarkable.  I think that the patient will need a right heart cath to rule out pulmonary HTN or a shunt lesion as a cause for her symptoms and hypoxemia. The alternative would probably be OHS.   - I will arrange for RHC with shunt run.   - Check BNP  2. Hyperlipidemia: Patient has diabetes and Buergers disease.  I think it would be a good idea for her to be on a statin.  She did not tolerate pravastatin due to myalgias.  I will have her try Crestor 5 mg every other day with lipids/LFTs in 2 months.  3. HTN: BP runs high.  I will have her increase valsartan to 240 mg daily with BMET in 2 wks.   4. Buergers disease: She has not had evaluation of this in a while now.  I will get a peripheral arterial doppler evaluation.    Loralie Champagne 06/19/2014

## 2014-06-22 NOTE — Discharge Instructions (Signed)
Wound Care °Wound care helps prevent pain and infection.  °HOME CARE  °· Only take medicine as told by your doctor. °· Clean the wound daily with mild soap and water. °· Change any bandages (dressings) as told by your doctor. °· Put medicated cream and a bandage on the wound as told by your doctor. °· Change the bandage if it gets wet, dirty, or starts to smell. °· Take showers. Do not take baths, swim, or do anything that puts your wound under water. °· Rest and raise (elevate) the wound until the pain and puffiness (swelling) are better. °· Keep all doctor visits as told. °GET HELP RIGHT AWAY IF:  °· Yellowish-white fluid (pus) comes from the wound. °· Medicine does not lessen your pain. °· There is a red streak going away from the wound. °· You have a fever. °MAKE SURE YOU:  °· Understand these instructions. °· Will watch your condition. °· Will get help right away if you are not doing well or get worse. °Document Released: 06/11/2008 Document Revised: 11/25/2011 Document Reviewed: 01/06/2011 °ExitCare® Patient Information ©2015 [employer]. This information is not intended to replace advice given to you by your health care provider. Make sure you discuss any questions you have with your health care provider. ° ° °

## 2014-06-22 NOTE — Interval H&P Note (Signed)
History and Physical Interval Note:  06/22/2014 1:12 PM  Debbie [name]  has presented today for surgery, with the diagnosis of pulmonary hypertension, sob  The various methods of treatment have been discussed with the patient and family. After consideration of risks, benefits and other options for treatment, the patient has consented to  Procedure(s): RIGHT HEART CATH (N/A) as a surgical intervention .  The patient's history has been reviewed, patient examined, no change in status, stable for surgery.  I have reviewed the patient's chart and labs.  Questions were answered to the patient's satisfaction.     [name]

## 2014-06-22 NOTE — CV Procedure (Signed)
    Cardiac Catheterization Procedure Note  Name: Debbie [name] MRN: [id number] DOB: 1946/01/11  Procedure: Right Heart Cath  Indication: Pulmonary hypertension   Procedural Details: The left brachial area was prepped, draped, and anesthetized with 1% lidocaine. There was a pre-existing peripheral IV in the right brachial area that was replaced with a 5 [place] sheath. A Swan-Ganz catheter was used for the right heart catheterization. Standard protocol was followed for recording of right heart pressures and sampling of oxygen saturations. Fick cardiac output was calculated. There were no immediate procedural complications. The patient was transferred to the post catheterization recovery area for further monitoring.  Procedural Findings: Hemodynamics RA mean 6 RV 44/9 PA 43/13, mean 25 PCWP mean 10   Oxygen saturations: PA 75% RA 74% AO 96%  Cardiac Output (Fick) 7.23  Cardiac Index (Fick) 3.43 PVR 2.0 WU   Final Conclusions:  Mild pulmonary hypertension with normal cardiac output and minimally elevated PVR.  Normal right and left heart filling pressures.  This level of pulmonary hypertension is not likely to be the cause of her hypoxemia and may be the result of hypoxemia.  Possible OHS/OSA, would suggest sleep study as the next step.   Loralie Champagne 06/22/2014, 1:43 PM

## 2014-06-27 ENCOUNTER — Other Ambulatory Visit: Payer: Self-pay | Admitting: Pulmonary Disease

## 2014-06-27 ENCOUNTER — Encounter: Payer: Self-pay | Admitting: Pulmonary Disease

## 2014-06-27 DIAGNOSIS — J9611 Chronic respiratory failure with hypoxia: Secondary | ICD-10-CM

## 2014-07-01 ENCOUNTER — Ambulatory Visit (HOSPITAL_COMMUNITY): Payer: Medicare Other | Attending: Cardiovascular Disease | Admitting: *Deleted

## 2014-07-01 ENCOUNTER — Other Ambulatory Visit (INDEPENDENT_AMBULATORY_CARE_PROVIDER_SITE_OTHER): Payer: Medicare Other | Admitting: *Deleted

## 2014-07-01 ENCOUNTER — Other Ambulatory Visit (HOSPITAL_COMMUNITY): Payer: Self-pay | Admitting: Cardiology

## 2014-07-01 DIAGNOSIS — E119 Type 2 diabetes mellitus without complications: Secondary | ICD-10-CM | POA: Diagnosis not present

## 2014-07-01 DIAGNOSIS — R0602 Shortness of breath: Secondary | ICD-10-CM

## 2014-07-01 DIAGNOSIS — Z87891 Personal history of nicotine dependence: Secondary | ICD-10-CM | POA: Diagnosis not present

## 2014-07-01 DIAGNOSIS — E785 Hyperlipidemia, unspecified: Secondary | ICD-10-CM | POA: Insufficient documentation

## 2014-07-01 DIAGNOSIS — I739 Peripheral vascular disease, unspecified: Secondary | ICD-10-CM

## 2014-07-01 DIAGNOSIS — I272 Pulmonary hypertension, unspecified: Secondary | ICD-10-CM

## 2014-07-01 DIAGNOSIS — I731 Thromboangiitis obliterans [Buerger's disease]: Secondary | ICD-10-CM

## 2014-07-01 DIAGNOSIS — I270 Primary pulmonary hypertension: Secondary | ICD-10-CM

## 2014-07-01 DIAGNOSIS — R0989 Other specified symptoms and signs involving the circulatory and respiratory systems: Secondary | ICD-10-CM | POA: Diagnosis not present

## 2014-07-01 DIAGNOSIS — I10 Essential (primary) hypertension: Secondary | ICD-10-CM | POA: Insufficient documentation

## 2014-07-01 LAB — BASIC METABOLIC PANEL
BUN: 10 mg/dL (ref 6–23)
CALCIUM: 9.3 mg/dL (ref 8.4–10.5)
CO2: 27 mEq/L (ref 19–32)
Chloride: 101 mEq/L (ref 96–112)
Creatinine, Ser: 0.7 mg/dL (ref 0.4–1.2)
GFR: 96.17 mL/min (ref >60.00)
Glucose, Bld: 145 mg/dL — ABNORMAL HIGH (ref 70–99)
Potassium: 4.2 mEq/L (ref 3.5–5.1)
SODIUM: 138 meq/L (ref 135–145)

## 2014-07-01 NOTE — Progress Notes (Signed)
Arterial Doppler Lower Performed

## 2014-07-01 NOTE — Progress Notes (Signed)
Arterial Duplex Lower Left Leg Performed

## 2014-07-04 ENCOUNTER — Encounter: Payer: Self-pay | Admitting: Pulmonary Disease

## 2014-07-14 ENCOUNTER — Ambulatory Visit: Payer: Medicare Other | Admitting: Cardiology

## 2014-07-21 ENCOUNTER — Ambulatory Visit (INDEPENDENT_AMBULATORY_CARE_PROVIDER_SITE_OTHER): Payer: Medicare Other | Admitting: Cardiology

## 2014-07-21 ENCOUNTER — Encounter: Payer: Self-pay | Admitting: Cardiology

## 2014-07-21 VITALS — BP 125/70 | HR 98 | Ht 67.5 in | Wt 233.0 lb

## 2014-07-21 DIAGNOSIS — I739 Peripheral vascular disease, unspecified: Secondary | ICD-10-CM

## 2014-07-21 DIAGNOSIS — R0609 Other forms of dyspnea: Secondary | ICD-10-CM

## 2014-07-21 DIAGNOSIS — J9611 Chronic respiratory failure with hypoxia: Secondary | ICD-10-CM

## 2014-07-21 NOTE — Patient Instructions (Signed)
Your physician recommends that you continue on your current medications as directed. Please refer to the Current Medication list given to you today. You have been referred to [place] wants you to follow-up in: [place].   You will receive a reminder letter in the mail two months in advance. If you don't receive a letter, please call our office to schedule the follow-up appointment. Ventilation-Perfusion Scan A ventilation-perfusion scan is a scan to look at the airflow (ventilation) and blood flow (perfusion) in your lungs. It is most often used to look for blood clots that may have traveled to your lungs. During this scan, radioactive compounds are injected into your body or are breathed in (inhale). These radioactive compounds are detected by a special camera during the scan, are given at very low doses, are not harmful to you, and last in your body for a very short time.  LET [facility] CARE PROVIDER KNOW ABOUT:  Any allergies you have.  All medicines you are taking, including vitamins, herbs, eye drops, creams, and over-the-counter medicines.  Any blood disorders you have.  Previous surgeries you have had.  Medical conditions you have.  Possibility of pregnancy, if this applies.  Breastfeeding, if this applies. RISKS AND COMPLICATIONS Generally, this is a safe procedure. However, as with any procedure, complications can occur. A possible complication includes having an allergic reaction to the radioactive compounds.  BEFORE THE PROCEDURE  Do not smoke before your test.  Take medicine as directed by your health care provider. PROCEDURE  A small needle will be placed in a vein in your arm or hand. This needle will stay in place for the entire exam.  A small amount of very short-acting radioactive material will be injected.  Your lungs will then be scanned using a special camera. This camera will record the images.  You will be  asked to inhale a second radioactive compound. After this, the lungs are scanned again. AFTER THE PROCEDURE  You may go home unless your health care provider instructs you differently.  You may continue with normal activities and diet as instructed by your health care provider. Document Released: 08/30/2000 Document Revised: 06/23/2013 Document Reviewed: 03/18/2013 [facility] Patient Information 2015 [place], [place]. This information is not intended to replace advice given to you by your health care provider. Make sure you discuss any questions you have with your health care provider.

## 2014-07-23 NOTE — Progress Notes (Signed)
Patient ID: Debbie [name], female   DOB: 24-Apr-1946, 68 y.o.   MRN: [id number] PCP: Dr. Edrick Oh Pulmonology: Dr. [name]  68 yo with history of Buerger's disease s/p right BKA, HTN, and type II diabetes was referred for evaluation of chronic hypoxemic respiratory failure.  She has had extensive pulmonary workup without definitive diagnosis.  She has had several episodes of PNA in the past . After PNA in 2013, she feels like she never completely recovered.  She is now short of breath after walking about 50 yards and has to ride in a motorized cart at the grocery store.  She uses oxygen with exertion and at night now.  She developed Buerger's disease and had right BKA in 1999.  She quit smoking at that time.  No orthopnea or PND.  PFTs were relatively normal except for low DLCO.  CT chest in 8/15 showed no PE and mild dependent atelectasis.  Echo in 8/15 showed EF 55%, normal-appearing RV.  No estimate of PA systolic pressure could be made. She does not have a definitive pulmonary diagnosis to explain her hypoxemia so was referred to cardiology.    After last appointment, she had right heat cath which showed mild pulmonary hypertension with PVR only 2.0 WU.  There was no evidence for left to right shunt.  She also had arterial dopplers showing occluded left PT and left peroneal arteries.    She has stable dyspnea.  She has had some leg cramps that are actually worse when she is off her feet lying down.  No ulcers on her feet.  No exertional leg pain.   Labs (8/15): K 3.6, creatinine 0.8 Labs (10/15): K 4.2, creatinine 0.7, BNP 28  ECG: NSR, PACs, poor anterior R wave progression  PMH: 1. Type II diabetes 2. GERD 3. Obesity 4. HTN 5. Buerger's disease: s/p right BKA at [facility] in 1990. Peripheral arterial dopplers (10/15) with left PT and left peroneal arteries occluded.  6. Recurrent PNA episodes.  7. PFTs (8/15): FVC 85%, FEV1 82%, ratio 95%, TLC 87%, DLCO 50% => near normal except for  decreased DLCO.  8. Echo (8/15) with EF 55%, normal RV size and systolic function, mild RAE, unable to estimate PA systolic pressure.  9. Pulmonary hypertension: Mild, probably due to OHS/OSA.  RHC (10/15) with mean RA 6, PA 43/13 mean 25, mean PCWP 10, CI 3.43, PVR 2 WU, PA sat 75%, RA sat 74% (no evidence for left to right shunt).    SH: Retired, lives in [place], quit smoking [place].    FH: No heart disease that she knows of.   ROS: All systems reviewed and negative except as per HPI.   Current Outpatient Prescriptions  Medication Sig Dispense Refill  . albuterol (PROVENTIL HFA;VENTOLIN HFA) 108 (90 BASE) MCG/ACT inhaler Inhale 2 puffs into the lungs every 6 (six) hours as needed for shortness of breath.     Marland Kitchen aspirin EC 81 MG tablet Take 81 mg by mouth daily.      . budesonide-formoterol (SYMBICORT) 160-4.5 MCG/ACT inhaler Inhale 2 puffs into the lungs 2 (two) times daily as needed. For Asthma    . Calcium Carb-Cholecalciferol (CALCIUM 1000 + D PO) Take 1,000 mg by mouth daily.    . cetirizine (ZYRTEC) 10 MG tablet Take 10 mg by mouth daily.    . clobetasol cream (TEMOVATE) 0.10 % Apply 1 application topically as needed. for psoriasis    . co-enzyme Q-10 30 MG capsule Take 1 capsule by mouth  daily.    . dexlansoprazole (DEXILANT) 60 MG capsule Take 60 mg by mouth at bedtime.    Marland Kitchen esomeprazole (NEXIUM) 20 MG capsule Take 20 mg by mouth daily.    . fluticasone (FLONASE) 50 MCG/ACT nasal spray Place 1 spray into the nose daily.    Marland Kitchen gabapentin (NEURONTIN) 100 MG capsule Take 100 mg by mouth 3 (three) times daily.    Marland Kitchen ibuprofen (ADVIL,MOTRIN) 200 MG tablet Take 600 mg by mouth every 6 (six) hours as needed. For pain    . Insulin Glargine (LANTUS SOLOSTAR) 100 UNIT/ML Solostar Pen Inject 40 Units into the skin daily after breakfast.     . magnesium oxide (MAG-OX) 400 MG tablet Take 400 mg by mouth daily.    Marland Kitchen NIFEdipine (PROCARDIA XL/ADALAT-CC) 30 MG 24 hr tablet Take 30 mg by mouth 2  (two) times daily.     . rosuvastatin (CRESTOR) 5 MG tablet Take 5 mg by mouth every other day.    . SitaGLIPtin-MetFORMIN HCl (JANUMET XR) 50-1000 MG TB24 Take 1 tablet by mouth 2 (two) times daily.     . valsartan (DIOVAN) 80 MG tablet Take 240 mg by mouth daily.     No current facility-administered medications for this visit.    BP 125/70 mmHg  Pulse 98  Ht 5' 7.5" (1.715 m)  Wt 233 lb (105.688 kg)  BMI 35.93 kg/m2  SpO2 96% General: NAD Neck: No JVD, no thyromegaly or thyroid nodule.  Lungs: Mildly decreased breath sounds bilaterally CV: Nondisplaced PMI.  Heart regular S1/S2, no S3/S4, no murmur.  Trace left ankle edema.  No carotid bruit.  Unable to palpate left pedal pulses.  Abdomen: Soft, nontender, no hepatosplenomegaly, no distention.  Skin: Intact without lesions or rashes.  Neurologic: Alert and oriented x 3.  Psych: Normal affect. Extremities: Prosthetic right leg HEENT: Normal.   Assessment/Plan: 1. Chronic respiratory failure with hypoxemia: She has had extensive pulmonary workup with no definite diagnosis.  She is now using oxygen at night and with exertion, NYHA class III symptoms.  She is not volume overloaded on exam. Echo with no definitive abnormality except for right atrial enlargement.  PFTs relatively unremarkable except for low DLCO suggesting pulmonary parenchymal disease versus pulmonary vascular disease.  CT chest was relatively unremarkable.  Right heart cath showed mild pulmonary hypertension with normal cardiac output and minimally elevated PVR.  No evidence for left to right shunt.  The mildly elevated PA pressure is likely due to the patient's hypoxemia.  I suspect OHS/OSA as the cause of her symptoms and mild pulmonary hypertension.  - Patient will have a sleep study in December. - Will get V/Q scan to rule out chronic PE as cause of dyspnea and mildly elevated PA pressure.   2. Hyperlipidemia: Patient has diabetes and Buergers disease.  I think it  would be a good idea for her to be on a statin.  She did not tolerate pravastatin due to myalgias.  She is tolerating Crestor.  Lipids/LFTs in 2 months.  3. HTN: BP ok on higher valsartan.  4. Buergers disease: Peripheral arterial doppler study shows occluded left peroneal and left PT arteries.  No claudication-type pain or pedal ulcerations.  She does have nocturnal leg cramps. Will refer back to [facility] for long-term followup of this problem.   Loralie Champagne 07/23/2014

## 2014-07-27 ENCOUNTER — Ambulatory Visit (HOSPITAL_COMMUNITY)
Admission: RE | Admit: 2014-07-27 | Discharge: 2014-07-27 | Disposition: A | Discharge status: Home / Self Care | Payer: Medicare Other | Source: Ambulatory Visit | Attending: Cardiology | Admitting: Cardiology

## 2014-07-27 ENCOUNTER — Ambulatory Visit (HOSPITAL_COMMUNITY): Payer: Medicare Other

## 2014-07-27 DIAGNOSIS — R0609 Other forms of dyspnea: Secondary | ICD-10-CM

## 2014-07-27 DIAGNOSIS — R0602 Shortness of breath: Secondary | ICD-10-CM | POA: Diagnosis present

## 2014-07-27 DIAGNOSIS — J9811 Atelectasis: Secondary | ICD-10-CM | POA: Insufficient documentation

## 2014-07-27 DIAGNOSIS — I739 Peripheral vascular disease, unspecified: Secondary | ICD-10-CM

## 2014-07-27 DIAGNOSIS — R0902 Hypoxemia: Secondary | ICD-10-CM | POA: Diagnosis present

## 2014-07-27 MED ORDER — TECHNETIUM TO 99M ALBUMIN AGGREGATED
6.0000 | Freq: Once | INTRAVENOUS | Status: AC | PRN
  Administered 2014-07-27: 6 via INTRAVENOUS

## 2014-07-27 MED ORDER — TECHNETIUM TC 99M DIETHYLENETRIAME-PENTAACETIC ACID: 40.0000 | Freq: Once | INTRAVENOUS | Status: AC | PRN

## 2014-07-27 NOTE — Progress Notes (Signed)
Pt had narrow approximately one inch abrasion to her left antecubital area that occurred when tegaderm dressing removed prior to IV removal following procedure by [facility] personnel. Looks like top layer of skin off and area is bright red, not actively bleeding.  Cleaned with NS and applied a 2x2 gauze.  Pt states she has an antibiotic cream at home that she will apply.  She says her skin is very fragile and this kind of thing happens frequently.  Instructed her to keep an eye on area for signs and symptoms of infection - redness, purulent drainage, fever - she verbalized understanding.

## 2014-08-04 ENCOUNTER — Encounter: Payer: Self-pay | Admitting: Vascular Surgery

## 2014-08-05 ENCOUNTER — Encounter: Payer: Self-pay | Admitting: Vascular Surgery

## 2014-08-05 ENCOUNTER — Other Ambulatory Visit: Payer: Self-pay | Admitting: Vascular Surgery

## 2014-08-05 ENCOUNTER — Ambulatory Visit (HOSPITAL_COMMUNITY)
Admission: RE | Admit: 2014-08-05 | Discharge: 2014-08-05 | Disposition: A | Discharge status: Home / Self Care | Payer: Medicare Other | Source: Ambulatory Visit | Attending: Vascular Surgery | Admitting: Vascular Surgery

## 2014-08-05 ENCOUNTER — Ambulatory Visit (INDEPENDENT_AMBULATORY_CARE_PROVIDER_SITE_OTHER): Payer: Medicare Other | Admitting: Vascular Surgery

## 2014-08-05 VITALS — BP 154/96 | HR 99 | Resp 16 | Ht 68.5 in | Wt 215.0 lb

## 2014-08-05 DIAGNOSIS — I70219 Atherosclerosis of native arteries of extremities with intermittent claudication, unspecified extremity: Secondary | ICD-10-CM

## 2014-08-05 DIAGNOSIS — I739 Peripheral vascular disease, unspecified: Secondary | ICD-10-CM

## 2014-08-05 DIAGNOSIS — M79609 Pain in unspecified limb: Secondary | ICD-10-CM

## 2014-08-05 NOTE — Addendum Note (Signed)
Addended by: Mena Goes on: 08/05/2014 02:18 PM   Modules accepted: Orders

## 2014-08-05 NOTE — Progress Notes (Signed)
Referred by:  Dione Housekeeper, MD [place], [place] [address]  Reason for referral: Left leg pain  History of Present Illness  Debbie [name] is a 68 y.o. (04/17/1946) female s/p R BKA for Buerger's who presents with chief complaint: left leg pain.  Onset of symptom occurred months ago without any obvious triggers.  Pain is described as cramping in thigh, calf and foot, severity 5-10/10, and associated with rest and ambulation.  The patient also notes weakness in her left leg.  She ambulated with a R BKA prosthesis.  Patient has attempted to treat this pain with rest and stretches.  The patient has no rest pain symptoms also and no leg wounds/ulcers.  She reportedly had an outside angiogram years ago which demonstrated her arteries were too small to operate upon.  Atherosclerotic risk factors include: DM, HTN, HLD, and prior smoking.  Pt came to office with home oxygen.  Past Medical History  Diagnosis Date  . DM type 2 (diabetes mellitus, type 2)   . Hypertension   . COPD (chronic obstructive pulmonary disease)   . Hyperlipidemia   . Hemorrhoid 12/04/2006  . Adenomatous polyp 12/04/2006    Past Surgical History  Procedure Laterality Date  . Fiberoptic bronchoscopy with endobronchial  ultrasound  10/22/2010    [place]  . Colonoscopy  12/03/2006    Dr. Delight Ovens, adenomatous polyp  . Esophagogastroduodenoscopy  11/03/2002    Dr. Gala Romney- normal exam- was done to check for possible foreign body  . Right bka  1990  . Cholecystectomy    . Colonoscopy  03/25/2012    Procedure: COLONOSCOPY;  Surgeon: Daneil Dolin, MD;  Location: [facility];  Service: Endoscopy;  Laterality: N/A;  10:30    History   Social History  . Marital Status: Married    Spouse Name: N/A    Number of Children: N/A  . Years of Education: N/A   Occupational History  . retired    Social History Main Topics  . Smoking status: Former Smoker -- 0.50 packs/day for 18 years    Types:  Cigarettes    Quit date: 09/16/1989  . Smokeless tobacco: Never Used  . Alcohol Use: No  . Drug Use: No  . Sexual Activity: Not on file   Other Topics Concern  . Not on file   Social History Narrative    Family History  Problem Relation Age of Onset  . Colon cancer Neg Hx   . CVA Mother 39  . Lung cancer Father     lung carcinoma  . Asthma Sister     Current Outpatient Prescriptions on File Prior to Visit  Medication Sig Dispense Refill  . albuterol (PROVENTIL HFA;VENTOLIN HFA) 108 (90 BASE) MCG/ACT inhaler Inhale 2 puffs into the lungs every 6 (six) hours as needed for shortness of breath.     Marland Kitchen aspirin EC 81 MG tablet Take 81 mg by mouth daily.      . budesonide-formoterol (SYMBICORT) 160-4.5 MCG/ACT inhaler Inhale 2 puffs into the lungs 2 (two) times daily as needed. For Asthma    . Calcium Carb-Cholecalciferol (CALCIUM 1000 + D PO) Take 1,000 mg by mouth daily.    . cetirizine (ZYRTEC) 10 MG tablet Take 10 mg by mouth daily.    . clobetasol cream (TEMOVATE) 8.41 % Apply 1 application topically as needed. for psoriasis    . co-enzyme Q-10 30 MG capsule Take 1 capsule by mouth daily.    Marland Kitchen dexlansoprazole (DEXILANT) 60  MG capsule Take 60 mg by mouth at bedtime.    Marland Kitchen esomeprazole (NEXIUM) 20 MG capsule Take 20 mg by mouth daily.    . fluticasone (FLONASE) 50 MCG/ACT nasal spray Place 1 spray into the nose daily.    Marland Kitchen gabapentin (NEURONTIN) 100 MG capsule Take 100 mg by mouth 3 (three) times daily.    Marland Kitchen ibuprofen (ADVIL,MOTRIN) 200 MG tablet Take 600 mg by mouth every 6 (six) hours as needed. For pain    . Insulin Glargine (LANTUS SOLOSTAR) 100 UNIT/ML Solostar Pen Inject 40 Units into the skin daily after breakfast.     . magnesium oxide (MAG-OX) 400 MG tablet Take 400 mg by mouth daily.    Marland Kitchen NIFEdipine (PROCARDIA XL/ADALAT-CC) 30 MG 24 hr tablet Take 30 mg by mouth 2 (two) times daily.     . rosuvastatin (CRESTOR) 5 MG tablet Take 5 mg by mouth every other day.    .  SitaGLIPtin-MetFORMIN HCl (JANUMET XR) 50-1000 MG TB24 Take 1 tablet by mouth 2 (two) times daily.     . valsartan (DIOVAN) 80 MG tablet Take 240 mg by mouth daily.     No current facility-administered medications on file prior to visit.    Allergies  Allergen Reactions  . Meloxicam     Causes excess Fluid buildup    REVIEW OF SYSTEMS:  (Positives checked otherwise negative)  CARDIOVASCULAR:  [ ]  chest pain, [ ]  chest pressure, [ ]  palpitations, [x]  shortness of breath when laying flat, [x]  shortness of breath with exertion,   [x]  pain in feet when walking, [x]  pain in feet when laying flat, [x]  history of blood clot in veins (DVT), [ ]  history of phlebitis, [ ]  swelling in legs, [ ]  varicose veins  PULMONARY:  [ ]  productive cough, [ ]  asthma, [ ]  wheezing  NEUROLOGIC:  [x]  weakness in arms or legs, [x]  numbness in arms or legs, [ ]  difficulty speaking or slurred speech, [ ]  temporary loss of vision in one eye, [ ]  dizziness  HEMATOLOGIC:  [ ]  bleeding problems, [ ]  problems with blood clotting too easily  MUSCULOSKEL:  [ ]  joint pain, [ ]  joint swelling  GASTROINTEST:  [ ]   Vomiting blood, [ ]   Blood in stool     GENITOURINARY:  [ ]   Burning with urination, [ ]   Blood in urine  PSYCHIATRIC:  [ ]  history of major depression  INTEGUMENTARY:  [ ]  rashes, [ ]  ulcers  CONSTITUTIONAL:  [ ]  fever, [ ]  chills   For VQI Use Only  PRE-ADM LIVING: Home  AMB STATUS: Ambulatory  CAD Sx: None  PRIOR CHF: None  STRESS TEST: [x]  No, [ ]  Normal, [ ]  + ischemia, [ ]  + MI, [ ]  Both   Physical Examination Filed Vitals:   08/05/14 1026  BP: 154/96  Pulse: 99  Resp: 16  Height: 5' 8.5" (1.74 m)  Weight: 215 lb (97.523 kg)   Body mass index is 32.21 kg/(m^2).  General: A&O x 3, WDWN, wearing home oxygen  Head: [place]/AT  Ear/Nose/Throat: Hearing grossly intact, nares w/o erythema or drainage, oropharynx w/o Erythema/Exudate  Eyes: PERRLA, EOMI  Neck: Supple, no nuchal  rigidity, no palpable LAD  Pulmonary: Sym exp, good air movt, CTAB, no rales, rhonchi, & wheezing  Cardiac: RRR, Nl S1, S2, no Murmurs, rubs or gallops  Vascular: Vessel Right Left  Radial Palpable Palpable  Brachial  Palpable Palpable  Carotid Palpable, without bruit Palpable, without bruit  Aorta  Not palpable N/A  Femoral Palpable Palpable  Popliteal Not palpable Not palpable  PT BKA Palpable  DP BKA Palpable   Gastrointestinal: soft, NTND, -G/R, - HSM, - masses, - CVAT B  Musculoskeletal: M/S 5/5 throughout except R BKA, Extremities without ischemic changes except Rubor in toes  Neurologic: CN 2-12 intact , Pain and light touch intact in extremities except decreased sensation in L plantar , Motor exam as listed above  Psychiatric: Judgment intact, Mood & affect appropriate for pt's clinical situation  Dermatologic: See M/S exam for extremity exam, no rashes otherwise noted  Lymph : No Cervical, Axillary, or Inguinal lymphadenopathy    Non-Invasive Vascular Imaging  L ABI (Date: 08/05/2014)  L: 1.01, DP: bi, PT: mono, TBI: 0.44   Medical Decision Making  JHADA RISK is a 68 y.o. female who presents with: atypical peripheral arterial disease, left leg pain   Waveforms in PT are consistent with Monophasic flow but the flow in DP appears normal.   Pt's sx are not onsistent with vasculogenic intermittent claudication.  I discussed in depth with the patient the nature of atherosclerosis, and emphasized the importance of maximal medical management including strict control of blood pressure, blood glucose, and lipid levels, antiplatelet agent, obtaining regular exercise, and cessation of smoking.    I discussed in depth with the patient a walking plan and how to execute such. The patient is currently on a statin: Crestor. The patient is currently on an anti-platelet: ASA.  The patient will follow up in 12 months for L ABI.  Thank you for allowing [place] to  participate in this patient's care.  Adele Barthel, MD [facility] [place] Office: [phone] Pager: [phone]  08/05/2014, 1:33 PM

## 2014-08-17 ENCOUNTER — Other Ambulatory Visit (INDEPENDENT_AMBULATORY_CARE_PROVIDER_SITE_OTHER): Payer: Medicare Other | Admitting: *Deleted

## 2014-08-17 DIAGNOSIS — I270 Primary pulmonary hypertension: Secondary | ICD-10-CM

## 2014-08-17 DIAGNOSIS — R0602 Shortness of breath: Secondary | ICD-10-CM

## 2014-08-17 DIAGNOSIS — I272 Pulmonary hypertension, unspecified: Secondary | ICD-10-CM

## 2014-08-17 DIAGNOSIS — I739 Peripheral vascular disease, unspecified: Secondary | ICD-10-CM

## 2014-08-17 DIAGNOSIS — E785 Hyperlipidemia, unspecified: Secondary | ICD-10-CM

## 2014-08-17 DIAGNOSIS — I731 Thromboangiitis obliterans [Buerger's disease]: Secondary | ICD-10-CM

## 2014-08-17 LAB — HEPATIC FUNCTION PANEL
ALBUMIN: 4.7 g/dL (ref 3.5–5.2)
ALK PHOS: 64 U/L (ref 39–117)
ALT: 27 U/L (ref 0–35)
AST: 33 U/L (ref 0–37)
Bilirubin, Direct: 0 mg/dL (ref 0.0–0.3)
Total Bilirubin: 0.7 mg/dL (ref 0.2–1.2)
Total Protein: 8.1 g/dL (ref 6.0–8.3)

## 2014-08-17 LAB — LIPID PANEL
Cholesterol: 147 mg/dL (ref 0–200)
HDL: 51.3 mg/dL (ref >39.00)
LDL Cholesterol: 79 mg/dL (ref 0–99)
NonHDL: 95.7
TRIGLYCERIDES: 84 mg/dL (ref 0.0–149.0)
Total CHOL/HDL Ratio: 3
VLDL: 16.8 mg/dL (ref 0.0–40.0)

## 2014-08-25 ENCOUNTER — Encounter (HOSPITAL_COMMUNITY): Payer: Self-pay | Admitting: Cardiology

## 2014-09-12 ENCOUNTER — Ambulatory Visit (HOSPITAL_BASED_OUTPATIENT_CLINIC_OR_DEPARTMENT_OTHER): Payer: Medicare Other | Attending: Pulmonary Disease | Admitting: Radiology

## 2014-09-12 VITALS — Ht 68.0 in | Wt 215.0 lb

## 2014-09-12 DIAGNOSIS — J9611 Chronic respiratory failure with hypoxia: Secondary | ICD-10-CM

## 2014-09-12 DIAGNOSIS — Z6832 Body mass index (BMI) 32.0-32.9, adult: Secondary | ICD-10-CM | POA: Diagnosis not present

## 2014-09-12 DIAGNOSIS — G4710 Hypersomnia, unspecified: Secondary | ICD-10-CM | POA: Diagnosis present

## 2014-09-12 DIAGNOSIS — G4730 Sleep apnea, unspecified: Secondary | ICD-10-CM | POA: Diagnosis not present

## 2014-09-12 DIAGNOSIS — G4733 Obstructive sleep apnea (adult) (pediatric): Secondary | ICD-10-CM

## 2014-09-22 ENCOUNTER — Ambulatory Visit (HOSPITAL_BASED_OUTPATIENT_CLINIC_OR_DEPARTMENT_OTHER): Payer: Medicare Other | Admitting: Pulmonary Disease

## 2014-09-22 DIAGNOSIS — G4733 Obstructive sleep apnea (adult) (pediatric): Secondary | ICD-10-CM

## 2014-09-22 NOTE — Sleep Study (Signed)
   NAME: Debbie [name] DATE OF BIRTH:  [birth year] MEDICAL RECORD NUMBER [id number]  LOCATION: [facility]  PHYSICIAN: [place] OF STUDY: 09/12/2014  SLEEP STUDY TYPE: Nocturnal Polysomnogram               REFERRING PHYSICIAN: Akiba Melfi, Armando Reichert, MD  INDICATION FOR STUDY: Hypersomnia with sleep apnea  EPWORTH SLEEPINESS SCORE:  5 HEIGHT: 5\' 8"  (172.7 cm)  WEIGHT: 215 lb (97.523 kg)    Body mass index is 32.7 kg/(m^2).  NECK SIZE: 14 in.  MEDICATIONS: Reviewed in sleep record  SLEEP ARCHITECTURE: The patient had a total sleep time of 375 minutes, with very little slow-wave sleep and only 92 minutes of REM. Sleep onset latency was normal at 14 minutes, and REM onset was normal at 58 minutes. Sleep efficiency was excellent at 91%.  RESPIRATORY DATA: The patient was found to have 56 obstructive apneas and no hypopneas, giving her an AHI of 9 events per hour. Events occurred primarily during REM, and there was mild snoring noted throughout.  OXYGEN DATA: The patient had oxygen desaturation as low as 94% with her obstructive events  CARDIAC DATA: Occasional PAC and PVC noted  MOVEMENT/PARASOMNIA: The patient was found to have large numbers of periodic limb movements, but only 1 per hour resulting in arousal or awakening. Were no abnormal behaviors noted.  IMPRESSION/ RECOMMENDATION:    1) mild obstructive sleep apnea/hypopnea syndrome, with an AHI of 9 events per hour and oxygen desaturation as low as 94%. Her events primarily occurred during REM, and therefore this may have a greater impact on her quality of life. Treatment for this degree of sleep apnea can include a trial of weight loss alone, upper airway surgery, dental appliance, and also CPAP. Clinical correlation is suggested.  2) occasional PAC and PVC noted, but no clinically significant arrhythmias were seen.    [place], American Board of Sleep Medicine  ELECTRONICALLY  SIGNED ON:  09/22/2014, 2:44 PM [place] PH: [phone]   FX: [phone] [place]

## 2014-09-22 NOTE — Progress Notes (Signed)
Pt needs ov to review sleep study 

## 2014-09-23 ENCOUNTER — Ambulatory Visit (INDEPENDENT_AMBULATORY_CARE_PROVIDER_SITE_OTHER): Payer: Medicare Other | Admitting: Pulmonary Disease

## 2014-09-23 ENCOUNTER — Encounter: Payer: Self-pay | Admitting: Pulmonary Disease

## 2014-09-23 VITALS — BP 124/74 | HR 96 | Temp 97.8°F | Ht 68.0 in | Wt 218.6 lb

## 2014-09-23 DIAGNOSIS — G4733 Obstructive sleep apnea (adult) (pediatric): Secondary | ICD-10-CM

## 2014-09-23 DIAGNOSIS — J9611 Chronic respiratory failure with hypoxia: Secondary | ICD-10-CM

## 2014-09-23 HISTORY — DX: Obstructive sleep apnea (adult) (pediatric): G47.33

## 2014-09-23 NOTE — Patient Instructions (Signed)
Will start on cpap as a trial.  Please call if you are having tolerance issues. Will pipe oxygen in thru the cpap device. Will see if they can get you a more portable oxygen source. followup with me again in 8 weeks.

## 2014-09-23 NOTE — Assessment & Plan Note (Signed)
The patient has mild obstructive sleep apnea by her recent study, and it is unclear whether this has anything to do with her unexplained chronic respiratory failure. I would find it unlikely that her obesity with basilar atelectasis and hypoaeration are entirely responsible for her chronic hypoxemia during the day, but I do think it is worthwhile giving her a trial of C Pap to see if she has significant improvement. She is willing to try C Pap with her oxygen for a period of time to see if there is improvement.

## 2014-09-23 NOTE — Progress Notes (Signed)
   Subjective:    Patient ID: Debbie [name], female    DOB: Feb 07, 1946, 69 y.o.   MRN: [id number]  HPI Patient comes in today for follow-up of her recent sleep study. She was found to have mild OSA, with an AHI of 9 events per hour. Reviewed the study with her in detail, and answered all of her questions.   Review of Systems  Constitutional: Negative for fever and unexpected weight change.  HENT: Negative for congestion, dental problem, ear pain, nosebleeds, postnasal drip, rhinorrhea, sinus pressure, sneezing, sore throat and trouble swallowing.   Eyes: Negative for redness and itching.  Respiratory: Positive for cough and shortness of breath. Negative for chest tightness and wheezing.   Cardiovascular: Negative for palpitations and leg swelling.  Gastrointestinal: Negative for nausea and vomiting.  Genitourinary: Negative for dysuria.  Musculoskeletal: Negative for joint swelling.  Skin: Negative for rash.  Neurological: Negative for headaches.  Hematological: Does not bruise/bleed easily.  Psychiatric/Behavioral: Negative for dysphoric mood. The patient is not nervous/anxious.        Objective:   Physical Exam Obese female in no acute distress Nose without purulence or discharge noted Neck without lymphadenopathy or thyromegaly Chest without crackles or wheezes Lower extremities with mild edema, no cyanosis Alert and oriented, moves all 4 extremities.       Assessment & Plan:

## 2014-11-02 ENCOUNTER — Telehealth: Payer: Self-pay | Admitting: Pulmonary Disease

## 2014-11-02 DIAGNOSIS — G4733 Obstructive sleep apnea (adult) (pediatric): Secondary | ICD-10-CM

## 2014-11-02 NOTE — Telephone Encounter (Signed)
Pt is aware that we will d/c CPAP. Order has been placed. Nothing further was needed at this time.

## 2014-11-02 NOTE — Telephone Encounter (Signed)
Spoke with pt. States that she does not want to continue with CPAP therapy. Does not feel like it is helping her at all. Had to change her mask but things are not getting better. Feels she is losing more sleep while trying to use.  [place] - please advise. Thanks.

## 2014-11-02 NOTE — Telephone Encounter (Signed)
Ok to send an order to d/c cpap. Should stay on oxygen during sleep, and work on getting her weight down

## 2014-11-16 ENCOUNTER — Other Ambulatory Visit: Payer: Self-pay | Admitting: Cardiology

## 2014-11-18 ENCOUNTER — Ambulatory Visit: Payer: Medicare Other | Admitting: Pulmonary Disease

## 2014-12-05 ENCOUNTER — Ambulatory Visit (INDEPENDENT_AMBULATORY_CARE_PROVIDER_SITE_OTHER): Payer: Medicare Other | Admitting: Pulmonary Disease

## 2014-12-05 ENCOUNTER — Encounter: Payer: Self-pay | Admitting: Pulmonary Disease

## 2014-12-05 VITALS — BP 118/64 | HR 82 | Temp 97.0°F | Ht 69.0 in | Wt 221.6 lb

## 2014-12-05 DIAGNOSIS — G4733 Obstructive sleep apnea (adult) (pediatric): Secondary | ICD-10-CM

## 2014-12-05 DIAGNOSIS — J9611 Chronic respiratory failure with hypoxia: Secondary | ICD-10-CM | POA: Diagnosis not present

## 2014-12-05 NOTE — Progress Notes (Signed)
   Subjective:    Patient ID: Debbie [name], female    DOB: 1946/07/26, 69 y.o.   MRN: [id number]  HPI The patient comes in today for follow-up of her known chronic hypoxemic respiratory failure of unknown origin. She has had a right heart catheterization that showed no shunt physiology, and minimal pulmonary hypertension. He has been treated for her mild obstructive sleep apnea, but was unable to tolerate the C Pap device. She feels that her dyspnea is at baseline, and is continuing on her oxygen. She is no longer using her inhalers, and I have told her that she does not have obstructive lung disease on her breathing studies.   Review of Systems  Constitutional: Negative for fever and unexpected weight change.  HENT: Positive for congestion and postnasal drip. Negative for dental problem, ear pain, nosebleeds, rhinorrhea, sinus pressure, sneezing, sore throat and trouble swallowing.   Eyes: Negative for redness and itching.  Respiratory: Positive for shortness of breath. Negative for cough, chest tightness and wheezing.   Cardiovascular: Negative for palpitations and leg swelling.  Gastrointestinal: Negative for nausea and vomiting.  Genitourinary: Negative for dysuria.  Musculoskeletal: Negative for joint swelling.  Skin: Negative for rash.  Neurological: Negative for headaches.  Hematological: Does not bruise/bleed easily.  Psychiatric/Behavioral: Negative for dysphoric mood. The patient is not nervous/anxious.        Objective:   Physical Exam Obese female in no acute distress Nose without purulence or discharge noted Neck without lymphadenopathy or thyromegaly Chest totally clear to auscultation, no wheezes Cardiac exam with regular rate and rhythm Right lower extremity with prosthesis, left without significant edema Alert and oriented, moves all 4 extremities.       Assessment & Plan:

## 2014-12-05 NOTE — Assessment & Plan Note (Signed)
The patient was unable to tolerate C Pap, and therefore this has been discontinued. Her degree of sleep apnea was very mild, and I suspect contributed very little to her overall issues. I have encouraged her to work aggressively on weight loss.

## 2014-12-05 NOTE — Assessment & Plan Note (Signed)
The patient has chronic hypoxemic respiratory failure of unknown origin. We have really found no specific etiology for this at this time, but she is doing well on supplemental oxygen. I have told her that aggressive weight loss would greatly improve her shortness of breath and also decrease her oxygen needs, but it is difficult for her to lose weight in light of all of her other medical issues.

## 2014-12-05 NOTE — Patient Instructions (Signed)
Continue on oxygen Work on weight loss followup with me again in 19mos.

## 2015-01-11 ENCOUNTER — Other Ambulatory Visit: Payer: Self-pay

## 2015-01-11 MED ORDER — ROSUVASTATIN CALCIUM 5 MG PO TABS: 5.0000 mg | ORAL_TABLET | ORAL | Status: DC

## 2015-02-09 ENCOUNTER — Ambulatory Visit: Payer: Medicare Other | Admitting: Cardiology

## 2015-02-21 ENCOUNTER — Ambulatory Visit (INDEPENDENT_AMBULATORY_CARE_PROVIDER_SITE_OTHER): Payer: Medicare Other | Admitting: Pulmonary Disease

## 2015-02-21 ENCOUNTER — Encounter: Payer: Self-pay | Admitting: Pulmonary Disease

## 2015-02-21 VITALS — BP 152/82 | HR 85 | Temp 99.0°F | Ht 68.5 in | Wt 222.0 lb

## 2015-02-21 DIAGNOSIS — J9611 Chronic respiratory failure with hypoxia: Secondary | ICD-10-CM | POA: Diagnosis not present

## 2015-02-21 DIAGNOSIS — G4733 Obstructive sleep apnea (adult) (pediatric): Secondary | ICD-10-CM

## 2015-02-21 NOTE — Assessment & Plan Note (Signed)
The patient has chronic hypoxemic respiratory failure of unknown origin. She has had an extensive workup with no specific cardiopulmonary etiology found. She has not had a cardiopulmonary exercise test, but would have great difficulty because of her prosthetic leg. I think it would be very beneficial for her to be in cardiopulmonary rehabilitation, but she does not feel that she can do it with her prosthetic leg. I have asked her to try to do whatever she can do get exercise, and to work on weight loss.

## 2015-02-21 NOTE — Progress Notes (Signed)
   Subjective:    Patient ID: Debbie [name], female    DOB: January 05, 1946, 69 y.o.   MRN: [id number]  HPI The patient comes in today for follow-up of her chronic hypoxemic respiratory failure of unknown origin. Been staying on her oxygen with exertion and sleep, and feels that she is at a stable baseline. She denies any worsening shortness of breath, cough, or congestion.   Review of Systems  Constitutional: Negative for fever, chills and unexpected weight change.  HENT: Negative for congestion, dental problem, ear pain, nosebleeds, postnasal drip, rhinorrhea, sinus pressure, sneezing, sore throat, trouble swallowing and voice change.   Eyes: Negative for redness, itching and visual disturbance.  Respiratory: Negative for cough, choking, chest tightness, shortness of breath and wheezing.   Cardiovascular: Negative for chest pain, palpitations and leg swelling.  Gastrointestinal: Negative for nausea, vomiting, abdominal pain and diarrhea.  Genitourinary: Negative for dysuria and difficulty urinating.  Musculoskeletal: Negative for joint swelling and arthralgias.  Skin: Negative for rash.  Neurological: Negative for tremors, syncope and headaches.  Hematological: Does not bruise/bleed easily.  Psychiatric/Behavioral: Negative for dysphoric mood. The patient is not nervous/anxious.        Objective:   Physical Exam Overweight female in no acute distress Nose without purulence or discharge noted Neck without lymphadenopathy or thyromegaly Chest with totally clear breath sounds, no wheezing or crackles Cardiac exam with regular rate and rhythm Lower extremities with minimal edema, no cyanosis Alert and oriented, moves all 4 extremities.       Assessment & Plan:

## 2015-02-21 NOTE — Progress Notes (Deleted)
   Subjective:    Patient ID: Debbie [name], female    DOB: 09-Oct-1945, 69 y.o.   MRN: [id number]  HPI    Review of Systems  Constitutional: Negative for fever and unexpected weight change.  HENT: Negative for congestion, dental problem, ear pain, nosebleeds, postnasal drip, rhinorrhea, sinus pressure, sneezing, sore throat and trouble swallowing.   Eyes: Negative for redness and itching.  Respiratory: Negative for cough, chest tightness, shortness of breath and wheezing.   Cardiovascular: Negative for palpitations and leg swelling.  Gastrointestinal: Negative for nausea and vomiting.  Genitourinary: Negative for dysuria.  Musculoskeletal: Negative for joint swelling.  Skin: Negative for rash.  Neurological: Negative for headaches.  Hematological: Does not bruise/bleed easily.  Psychiatric/Behavioral: Negative for dysphoric mood. The patient is not nervous/anxious.        Objective:   Physical Exam        Assessment & Plan:

## 2015-02-21 NOTE — Assessment & Plan Note (Signed)
The patient continues on oxygen nocturnally since she is completely C Pap tolerance.

## 2015-02-21 NOTE — Patient Instructions (Signed)
Continue with your oxygen with sleep and exertion. Work on some type of exercise program if possible, and try and get your weight down.  followup with Dr. Lake Bells in 44mos.

## 2015-05-17 ENCOUNTER — Ambulatory Visit (INDEPENDENT_AMBULATORY_CARE_PROVIDER_SITE_OTHER): Payer: Medicare Other | Admitting: Cardiology

## 2015-05-17 ENCOUNTER — Encounter: Payer: Self-pay | Admitting: Cardiology

## 2015-05-17 VITALS — BP 150/90 | HR 91 | Ht 68.5 in | Wt 216.5 lb

## 2015-05-17 DIAGNOSIS — E785 Hyperlipidemia, unspecified: Secondary | ICD-10-CM | POA: Diagnosis not present

## 2015-05-17 DIAGNOSIS — J9611 Chronic respiratory failure with hypoxia: Secondary | ICD-10-CM

## 2015-05-17 DIAGNOSIS — I10 Essential (primary) hypertension: Secondary | ICD-10-CM

## 2015-05-17 DIAGNOSIS — I739 Peripheral vascular disease, unspecified: Secondary | ICD-10-CM | POA: Diagnosis not present

## 2015-05-17 NOTE — Patient Instructions (Signed)
Medication Instructions:  No changes today  Labwork: None today  Testing/Procedures: None today  Follow-Up: Your physician wants you to follow-up in: 1 year with Dr McLean. (August 2017).  You will receive a reminder letter in the mail two months in advance. If you don't receive a letter, please call our office to schedule the follow-up appointment.      

## 2015-05-18 NOTE — Progress Notes (Signed)
Patient ID: Debbie [name], female   DOB: 10-14-1945, 69 y.o.   MRN: [id number] PCP: Dr. Edrick Oh Pulmonology: Dr. [name]  69 yo with history of Buerger's disease s/p right BKA, HTN, and type II diabetes was referred for evaluation of chronic hypoxemic respiratory failure.  She has had extensive pulmonary workup without definitive diagnosis.  She has had several episodes of PNA in the past . After PNA in 2013, she feels like she never completely recovered.  She is now short of breath after walking about 50 yards and has to ride in a motorized cart at the grocery store.  She uses oxygen with exertion and at night now.  She developed Buerger's disease and had right BKA in 1999.  She quit smoking at that time.  No orthopnea or PND.  PFTs were relatively normal except for low DLCO.  CT chest in 8/15 showed no PE and mild dependent atelectasis.  Echo in 8/15 showed EF 55%, normal-appearing RV.  No estimate of PA systolic pressure could be made. She does not have a definitive pulmonary diagnosis to explain her hypoxemia so was referred to cardiology.  She had right heart cath in 10/15 which showed mild pulmonary hypertension with PVR only 2.0 WU.  There was no evidence for left to right shunt.  She also had arterial dopplers showing occluded left PT and left peroneal arteries.  Following with [facility] now for Buergers disease.   She has stable dyspnea walking short distances.  No chest pain.  BP is high today, but she checks it at home and SBP always < 140.  She is having trouble with memory and wants to have an evaluation of this.   Labs (8/15): K 3.6, creatinine 0.8 Labs (10/15): K 4.2, creatinine 0.7, BNP 28 Labs (12/15): LDL 79, HDL 31  ECG: NSR, PAC, nonspecific T wave changes  PMH: 1. Type II diabetes 2. GERD 3. Obesity 4. HTN 5. Buerger's disease: s/p right BKA at [facility] in 1990. Peripheral arterial dopplers (10/15) with left PT and left peroneal arteries occluded.  6. Recurrent PNA episodes.   7. PFTs (8/15): FVC 85%, FEV1 82%, ratio 95%, TLC 87%, DLCO 50% => near normal except for decreased DLCO.  8. Echo (8/15) with EF 55%, normal RV size and systolic function, mild RAE, unable to estimate PA systolic pressure.  9. Pulmonary hypertension: Mild, probably due to OHS/OSA.  RHC (10/15) with mean RA 6, PA 43/13 mean 25, mean PCWP 10, CI 3.43, PVR 2 WU, PA sat 75%, RA sat 74% (no evidence for left to right shunt).   10. V/Q scan (11/15) with low risk for PE.  11. OHS/OSA: Intolerant of CPAP.  Wears oxygen.  12. Hyperlipidemia: Myalgias with statins.   SH: Retired, lives in [place], quit smoking [place].    FH: No heart disease that she knows of.   ROS: All systems reviewed and negative except as per HPI.   Current Outpatient Prescriptions  Medication Sig Dispense Refill  . aspirin EC 81 MG tablet Take 81 mg by mouth daily.      . BD PEN NEEDLE NANO U/F 32G X 4 MM MISC 2 (two) times daily. as directed  6  . Calcium Carb-Cholecalciferol (CALCIUM 1000 + D PO) Take 1,000 mg by mouth daily.    . cetirizine (ZYRTEC) 10 MG tablet Take 10 mg by mouth daily.    . clobetasol cream (TEMOVATE) 8.50 % Apply 1 application topically as needed. for psoriasis    . co-enzyme  Q-10 30 MG capsule Take 1 capsule by mouth daily.    Marland Kitchen dexlansoprazole (DEXILANT) 60 MG capsule Take 60 mg by mouth at bedtime.    Marland Kitchen esomeprazole (NEXIUM) 20 MG capsule Take 20 mg by mouth daily.    . fluticasone (FLONASE) 50 MCG/ACT nasal spray Place 1 spray into the nose daily.    Marland Kitchen gabapentin (NEURONTIN) 100 MG capsule Take 100 mg by mouth 3 (three) times daily.    Marland Kitchen ibuprofen (ADVIL,MOTRIN) 200 MG tablet Take 600 mg by mouth every 6 (six) hours as needed. For pain    . Insulin Glargine (LANTUS SOLOSTAR) 100 UNIT/ML Solostar Pen Inject 55 Units into the skin daily after breakfast.     . NIFEdipine (PROCARDIA XL/ADALAT-CC) 30 MG 24 hr tablet Take 30 mg by mouth 2 (two) times daily.     . SitaGLIPtin-MetFORMIN HCl (JANUMET  XR) 50-1000 MG TB24 Take 1 tablet by mouth 2 (two) times daily.     . valsartan (DIOVAN) 160 MG tablet Take 160 mg by mouth daily.  11  . ZETIA 10 MG tablet Take 10 mg by mouth daily.  5   No current facility-administered medications for this visit.    BP 150/90 mmHg  Pulse 91  Ht 5' 8.5" (1.74 m)  Wt 216 lb 8 oz (98.204 kg)  BMI 32.44 kg/m2  LMP  (Exact Date) General: NAD Neck: No JVD, no thyromegaly or thyroid nodule.  Lungs: Mildly decreased breath sounds bilaterally CV: Nondisplaced PMI.  Heart regular S1/S2, no S3/S4, no murmur.  Trace left ankle edema.  No carotid bruit.  Unable to palpate left pedal pulses.  Abdomen: Soft, nontender, no hepatosplenomegaly, no distention.  Skin: Intact without lesions or rashes.  Neurologic: Alert and oriented x 3.  Psych: Normal affect. Extremities: Prosthetic right leg HEENT: Normal.   Assessment/Plan: 1. Chronic respiratory failure with hypoxemia: Suspect OHS/OSA.  She has had extensive pulmonary workup. She is now using oxygen at night and with exertion, NYHA class III symptoms (stable).  She is not volume overloaded on exam. Echo with no definitive abnormality except for right atrial enlargement.  PFTs relatively unremarkable except for low DLCO suggesting pulmonary parenchymal disease versus pulmonary vascular disease.  CT chest was relatively unremarkable.  V/Q scan with no evidence for chronic PE.  Right heart cath showed mild pulmonary hypertension with normal cardiac output and minimally elevated PVR.  No evidence for left to right shunt.  The mildly elevated PA pressure was likely due to the patient's hypoxemia.  Has OSA but unable to tolerate CPAP.  - Continue oxygen and followup with pulmonology.    2. Hyperlipidemia: Patient has diabetes and Buergers disease.  She has been unable to tolerate statins and is on Zetia. Lipids in 12/15 looked good.   3. HTN: BP has been controlled at home.   4. Buergers disease: Peripheral arterial  doppler study in 10/15 showed occluded left peroneal and left PT arteries.  No claudication-type pain or pedal ulcerations.  She does have nocturnal leg cramps. She is followed at [facility]. 5. Memory difficulty: She is very concerned about this.  I will refer her to neurology for evaluation.    Loralie Champagne 05/18/2015

## 2015-08-14 ENCOUNTER — Encounter: Payer: Self-pay | Admitting: Family

## 2015-08-16 ENCOUNTER — Ambulatory Visit (INDEPENDENT_AMBULATORY_CARE_PROVIDER_SITE_OTHER): Payer: Medicare Other | Admitting: Family

## 2015-08-16 ENCOUNTER — Ambulatory Visit (HOSPITAL_COMMUNITY)
Admission: RE | Admit: 2015-08-16 | Discharge: 2015-08-16 | Disposition: A | Discharge status: Home / Self Care | Payer: Medicare Other | Source: Ambulatory Visit | Attending: Vascular Surgery | Admitting: Vascular Surgery

## 2015-08-16 ENCOUNTER — Encounter: Payer: Self-pay | Admitting: Family

## 2015-08-16 VITALS — BP 136/65 | HR 59 | Temp 97.0°F | Resp 16 | Ht 68.5 in | Wt 214.0 lb

## 2015-08-16 DIAGNOSIS — I70219 Atherosclerosis of native arteries of extremities with intermittent claudication, unspecified extremity: Secondary | ICD-10-CM | POA: Diagnosis present

## 2015-08-16 DIAGNOSIS — M79606 Pain in leg, unspecified: Secondary | ICD-10-CM

## 2015-08-16 DIAGNOSIS — I731 Thromboangiitis obliterans [Buerger's disease]: Secondary | ICD-10-CM | POA: Diagnosis not present

## 2015-08-16 DIAGNOSIS — R0989 Other specified symptoms and signs involving the circulatory and respiratory systems: Secondary | ICD-10-CM

## 2015-08-16 NOTE — Progress Notes (Signed)
[facility] -PAD  History of Present Illness Debbie [name] is a 69 y.o. female patient of Dr. Bridgett Larsson who is s/p R BKA for Buerger's Disease or Raynaud's Syndrome, who initially presented with chief complaint: left leg pain. Onset of symptom occurred months before her initial visit with Dr. Bridgett Larsson in November 2015, without any obvious triggers. Pain was described as cramping in thigh, calf and foot, severity 5-10/10, and associated with rest and ambulation. The patient also notes weakness in her left leg. She ambulated with a R BKA prosthesis. Patient has attempted to treat this pain with rest and stretches. The patient has no rest pain symptoms also and no leg wounds/ulcers. She reportedly had an outside angiogram years ago which demonstrated her arteries were too small to operate upon. Atherosclerotic risk factors include: DM, HTN, HLD, and prior smoking. Pt wears home oxygen by [place], 3L.  Dr. Bridgett Larsson last saw pt on 08/05/14. At that time Dr. Bridgett Larsson felt that pt's sx were not consistent with vasculogenic intermittent claudication. Her left ABI was normal. She has a history of left leg radiculopathy for which she received ESI's; this temporarily helped.   The pt denies any history of stroke or TIA.  She sees Dr. Aundra Dubin as part of her evaluation of dyspnea.  She can walk about 150 feet before cramping in the posterior aspects of both thighs and in her right BKA stump occur; she is also limited by dyspnea.  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  Pt Diabetic: Yes, states her last A1C was 6.?, in control Pt smoker: former smoker, quit in 1992  Pt meds include: Statin :No, had severe myalgias to statins Betablocker: No ASA: Yes Other anticoagulants/antiplatelets: no   Past Medical History  Diagnosis Date  . DM type 2 (diabetes mellitus, type 2) ([place])   . Hypertension   . COPD  (chronic obstructive pulmonary disease) ([place])   . Hyperlipidemia   . Hemorrhoid 12/04/2006  . Adenomatous polyp 12/04/2006  . Peripheral arterial disease [facility])     Social History Social History  Substance Use Topics  . Smoking status: Former Smoker -- 0.50 packs/day for 18 years    Types: Cigarettes    Quit date: 09/16/1989  . Smokeless tobacco: Never Used  . Alcohol Use: No    Family History Family History  Problem Relation Age of Onset  . Colon cancer Neg Hx   . CVA Mother 35  . Lung cancer Father     lung carcinoma  . Asthma Sister     Past Surgical History  Procedure Laterality Date  . Fiberoptic bronchoscopy with endobronchial  ultrasound  10/22/2010    [place]  . Colonoscopy  12/03/2006    Dr. Delight Ovens, adenomatous polyp  . Esophagogastroduodenoscopy  11/03/2002    Dr. Gala Romney- normal exam- was done to check for possible foreign body  . Right bka  1990  . Cholecystectomy    . Colonoscopy  03/25/2012    Procedure: COLONOSCOPY;  Surgeon: Daneil Dolin, MD;  Location: [facility] SUITE;  Service: Endoscopy;  Laterality: N/A;  10:30  . Right heart catheterization N/A 06/22/2014    Procedure: RIGHT HEART CATH;  Surgeon: Larey Dresser, MD;  Location: [facility] CATH LAB;  Service: Cardiovascular;  Laterality: N/A;    Allergies  Allergen Reactions  . Meloxicam Other (See Comments)    Causes excess Fluid buildup    Current Outpatient Prescriptions  Medication Sig Dispense Refill  . aspirin EC 81 MG tablet Take 81 mg by mouth daily.      . BD PEN NEEDLE NANO U/F 32G X 4 MM MISC 2 (two) times daily. as directed  6  . Calcium Carb-Cholecalciferol (CALCIUM 1000 + D PO) Take 1,000 mg by mouth daily.    . cetirizine (ZYRTEC) 10 MG tablet Take 10 mg by mouth daily.    . clobetasol cream (TEMOVATE) [id number] % Apply 1 application topically as needed. for psoriasis    . co-enzyme Q-10 30 MG capsule Take 1 capsule by mouth daily.    Marland Kitchen dexlansoprazole (DEXILANT) 60 MG capsule Take 60 mg  by mouth at bedtime.    . diclofenac sodium (VOLTAREN) 1 % GEL as needed.    Marland Kitchen esomeprazole (NEXIUM) 20 MG capsule Take 20 mg by mouth daily.    . fluticasone (FLONASE) 50 MCG/ACT nasal spray Place 1 spray into the nose daily.    Marland Kitchen gabapentin (NEURONTIN) 100 MG capsule Take 100 mg by mouth 3 (three) times daily.    Marland Kitchen ibuprofen (ADVIL,MOTRIN) 200 MG tablet Take 600 mg by mouth every 6 (six) hours as needed. For pain    . Insulin Glargine (LANTUS SOLOSTAR) 100 UNIT/ML Solostar Pen Inject 55 Units into the skin daily after breakfast.     . Insulin Pen Needle (BD PEN NEEDLE NANO U/F) 32G X 4 MM MISC USE TWICE DAILY AS DIRECTED    . NIFEdipine (PROCARDIA XL/ADALAT-CC) 30 MG 24 hr tablet Take 30 mg by mouth 2 (two) times daily.     . Probiotic Product (PROBIOTIC ADVANCED PO) Take by mouth daily.    . SitaGLIPtin-MetFORMIN HCl (JANUMET XR) 50-1000 MG TB24 Take 1 tablet by mouth 2 (two) times daily.     . valsartan (DIOVAN) 160 MG tablet Take 160 mg by mouth daily.  11  . ZETIA 10 MG tablet Take 10 mg by mouth daily.  5   No current facility-administered medications for this visit.    ROS: See HPI for pertinent positives and negatives.   Physical Examination  Filed Vitals:   08/16/15 1159 08/16/15 1213  BP: 143/70 136/65  Pulse: 62 59  Temp: 97 F (36.1 C)   TempSrc: Oral   Resp: 16   Height: 5' 8.5" (1.74 m)   Weight: 214 lb (97.07 kg)   SpO2: 95%    Body mass index is 32.06 kg/(m^2).  General: A&O x 3, WDWN, wearing home oxygen, obese female  Eyes: PERRLA  Pulmonary: Sym exp, good air movt, CTAB, no rales, rhonchi, & wheezing  Cardiac: RRR, Nl S1, S2, no appreciable murmur  Vascular: Vessel Right Left  Radial Palpable Palpable  Carotid Palpable, with bruit Palpable, without bruit  Aorta Not palpable N/A  Femoral Palpable Palpable  Popliteal Not palpable Not palpable  PT BKA Not Palpable  DP BKA Not Palpable   Gastrointestinal: soft, NTND, -G/R,  - HSM, - palpable masses, - CVAT B  Musculoskeletal: M/S 5/5 throughout except R BKA, Extremities without ischemic changes.  Neurologic: CN 2-12 intact , Pain and light touch intact in extremities except decreased sensation in L plantar , Motor exam as listed above  Psychiatric: Judgment intact, Mood & affect appropriate for pt's clinical situation  Dermatologic: See M/S exam for extremity exam, psoriasis patches on left leg.            Non-Invasive Vascular Imaging: DATE: 08/16/2015 ABI: RIGHT: BKA;  LEFT: 1.03 (1.01, 08/05/14), Waveforms: tri and biphasic,  TBI: 0.48   ASSESSMENT: Debbie [name] is a 69 y.o. 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  She has not smoked since 1992 and her DM remains in control. She ambulates with her right BKA prosthesis and walker. Left ABI today is normal with bi and triphasic waveforms.   Right carotid bruit, no history of stroke or TIA, no carotid duplex result on file. Will check carotid duplex on her return in a year along with left ABI.  PLAN:  Based on the patient's vascular studies and examination, pt will return to clinic in 1 year with ABI and carotid duplex.  I discussed in depth with the patient the nature of atherosclerosis, and emphasized the importance of maximal medical management including strict control of blood pressure, blood glucose, and lipid levels, obtaining regular exercise, and continued cessation of smoking.  The patient is aware that without maximal medical management the underlying atherosclerotic disease process will progress, limiting the benefit of any interventions.  The patient was given information about PAD including signs, symptoms, treatment, what symptoms should prompt the patient to seek immediate medical care, and risk reduction measures to take.  Clemon Chambers, RN, MSN, FNP-C [facility] [employer] Phone: [phone]  Clinic MD: Bridgett Larsson  08/16/2015 12:24 PM

## 2015-08-16 NOTE — Progress Notes (Signed)
Filed Vitals:   08/16/15 1159 08/16/15 1213  BP: 143/70 136/65  Pulse: 62 59  Temp: 97 F (36.1 C)   TempSrc: Oral   Resp: 16   Height: 5' 8.5" (1.74 m)   Weight: 214 lb (97.07 kg)   SpO2: 95%

## 2015-08-18 ENCOUNTER — Ambulatory Visit: Payer: Medicare Other | Admitting: Family

## 2015-08-18 ENCOUNTER — Encounter (HOSPITAL_COMMUNITY): Payer: Medicare Other

## 2015-09-19 ENCOUNTER — Ambulatory Visit: Payer: Medicare Other | Admitting: Pulmonary Disease

## 2015-09-29 ENCOUNTER — Encounter: Payer: Self-pay | Admitting: Internal Medicine

## 2015-09-29 ENCOUNTER — Ambulatory Visit (INDEPENDENT_AMBULATORY_CARE_PROVIDER_SITE_OTHER): Payer: Medicare Other | Admitting: Internal Medicine

## 2015-09-29 VITALS — BP 162/80 | HR 55 | Ht 68.5 in | Wt 221.4 lb

## 2015-09-29 DIAGNOSIS — J9611 Chronic respiratory failure with hypoxia: Secondary | ICD-10-CM | POA: Diagnosis not present

## 2015-09-29 MED ORDER — DEXLANSOPRAZOLE 60 MG PO CPDR: 60.0000 mg | DELAYED_RELEASE_CAPSULE | Freq: Every day | ORAL | Status: DC

## 2015-09-29 NOTE — Assessment & Plan Note (Addendum)
Complicated by HBP/ Hyperlipidemia/ DM  And also chronic hypoxemia RF though technically not OHS as has not been chronically hypercarbic to date (though wouldn't be surprised if she eventually is).  Body mass index is 33.17 kg/(m^2).  No results found for: TSH   Contributing to gerd tendency/ doe/reviewed the need and the process to achieve and maintain neg calorie balance > defer f/u primary care including intermittently monitoring thyroid status

## 2015-09-29 NOTE — Patient Instructions (Addendum)
No change in recommendations for 02   Try to keep your weight trending down if at all possible to help your lower lobes get better airflow   Please schedule a follow up visit in 12 months but call sooner if needed

## 2015-09-29 NOTE — Assessment & Plan Note (Addendum)
ONO RA 03/2014:  Desat to 72% Ambulatory ox 2015:  desat with walking CT chest 04/2014 with no PE, no ISLD, mild basilar atx from obesity. PFTs 04/25/14   VC 2.82 (87%) no airflow obst/ ERV 42% and dlco 50% > corrects to 60% for alv vol V/Q 07/2014:  No chronic TE disease Echo with DD, unable to estimate PA pressures, but nothing overt suggestive of clinically significant pulmonary htn. [place] 06/2014:  PA 43/13, mean PA 25, PCWP 10, PVR 2 WU, negative shunt run.  - 09/29/2015   Walked 3lpm   2 laps @ 185 ft each stopped due to sob / slow pace no desats    I had an extended discussion with the patient reviewing all relevant studies completed to date and  lasting 25  minutes of a 40 minute transition of care  visit    1) no need to change rx =  3lpm hs and with exertion   2) most likely this is due to effects of obesity on the dependent areas of her lower lobes where previously experienced ALI from pna which did not completely recover  3) no evidence of sign copd or any lung condition amenable to any intervention other than wt loss   Each maintenance medication was reviewed in detail including most importantly the difference between maintenance and prns and under what circumstances the prns are to be triggered using an action plan format that is not reflected in the computer generated alphabetically organized AVS.    Please see instructions for details which were reviewed in writing and the patient given a copy highlighting the part that I personally wrote and discussed at today's ov.

## 2015-09-29 NOTE — Progress Notes (Signed)
Subjective:     Patient ID: Debbie [name], female   DOB: Jun 11, 1946, 70 y.o.   MRN: [id number]  HPI  27 yowm quit smoking 1990 with ? pna in 2011/2012  [place] all acute changes resolved p rx for pna but ever since has required 02 at hs/ and with activity and followed previously by Dr [name] for chronic resp failure    09/29/2015 1st  office visit/ [facility]  Transition of care  Chief Complaint  Patient presents with  . Follow-up    Former Dr [name] pt. Breathing is unchanged. She does notice it gets worse with colder weather.   sob bending over / struggles to walk eg HT due to R leg prosthesis and doe  So has used scooter x years   No obvious day to day or daytime variability or assoc chronic cough or cp or chest tightness, subjective wheeze or overt sinus or hb symptoms. No unusual exp hx or h/o childhood pna/ asthma or knowledge of premature birth.  Sleeping ok without nocturnal  or early am exacerbation  of respiratory  c/o's or need for noct saba. Also denies any obvious fluctuation of symptoms with weather or environmental changes or other aggravating or alleviating factors except as outlined above   Current Medications, Allergies, Complete Past Medical History, Past Surgical History, Family History, and Social History were reviewed in [employer] record.  ROS  The following are not active complaints unless bolded sore throat, dysphagia, dental problems, itching, sneezing,  nasal congestion or excess/ purulent secretions, ear ache,   fever, chills, sweats, unintended wt loss, classically pleuritic or exertional cp, hemoptysis,  orthopnea pnd or leg swelling, presyncope, palpitations, abdominal pain, anorexia, nausea, vomiting, diarrhea  or change in bowel or bladder habits, change in stools or urine, dysuria,hematuria,  rash, arthralgias, visual complaints, headache, numbness, weakness or ataxia or problems with walking or coordination,  change in  mood/affect or memory.         Review of Systems     Objective:   Physical Exam    amb obese wf nad  Wt Readings from Last 3 Encounters:  09/29/15 221 lb 6.4 oz (100.426 kg)  08/16/15 214 lb (97.07 kg)  05/17/15 216 lb 8 oz (98.204 kg)    Vital signs reviewed    HEENT: nl dentition, turbinates, and oropharynx. Nl external ear canals without cough reflex   NECK :  without JVD/Nodes/TM/ nl carotid upstrokes bilaterally   LUNGS: no acc muscle use,  Nl contour chest which is clear to A and P bilaterally without cough on insp or exp maneuvers   CV:  RRR  no s3 or murmur or increase in P2, no edema   ABD:  soft and nontender with nl inspiratory excursion in the supine position. No bruits or organomegaly, bowel sounds nl  MS:  Nl gait/ ext warm without deformities, calf tenderness, cyanosis or clubbing No obvious joint restrictions   SKIN: warm and dry without lesions    NEURO:  alert, approp, nl sensorium with  no motor deficits       Assessment:

## 2015-12-28 ENCOUNTER — Telehealth: Payer: Self-pay | Admitting: Internal Medicine

## 2015-12-28 NOTE — Telephone Encounter (Signed)
Received request for records from [facility]. I faxed 18 pages to them on 12/27/2015 and called and confirmed that they received the 18 pages today

## 2016-08-14 ENCOUNTER — Encounter: Payer: Self-pay | Admitting: Family

## 2016-08-16 ENCOUNTER — Ambulatory Visit (HOSPITAL_COMMUNITY)
Admission: RE | Admit: 2016-08-16 | Discharge: 2016-08-16 | Disposition: A | Discharge status: Home / Self Care | Payer: Medicare Other | Source: Ambulatory Visit | Attending: Family | Admitting: Family

## 2016-08-16 ENCOUNTER — Other Ambulatory Visit: Payer: Self-pay | Admitting: *Deleted

## 2016-08-16 ENCOUNTER — Ambulatory Visit (INDEPENDENT_AMBULATORY_CARE_PROVIDER_SITE_OTHER)
Admission: RE | Admit: 2016-08-16 | Discharge: 2016-08-16 | Disposition: A | Discharge status: Home / Self Care | Payer: Medicare Other | Source: Ambulatory Visit | Attending: Family | Admitting: Family

## 2016-08-16 ENCOUNTER — Ambulatory Visit (INDEPENDENT_AMBULATORY_CARE_PROVIDER_SITE_OTHER): Payer: Medicare Other | Admitting: Family

## 2016-08-16 ENCOUNTER — Encounter: Payer: Self-pay | Admitting: Family

## 2016-08-16 VITALS — BP 143/83 | HR 82 | Temp 97.4°F | Resp 16 | Ht 69.0 in | Wt 211.3 lb

## 2016-08-16 DIAGNOSIS — M79606 Pain in leg, unspecified: Secondary | ICD-10-CM | POA: Diagnosis not present

## 2016-08-16 DIAGNOSIS — I6523 Occlusion and stenosis of bilateral carotid arteries: Secondary | ICD-10-CM | POA: Insufficient documentation

## 2016-08-16 DIAGNOSIS — I731 Thromboangiitis obliterans [Buerger's disease]: Secondary | ICD-10-CM

## 2016-08-16 DIAGNOSIS — R0989 Other specified symptoms and signs involving the circulatory and respiratory systems: Secondary | ICD-10-CM

## 2016-08-16 NOTE — Progress Notes (Signed)
[facility] [place] HISTORY AND PHYSICAL   MRN : [id number]  History of Present Illness:   Debbie [name] is a 70 y.o. female patient of Dr. Bridgett Larsson who is s/p R BKA for Buerger's Disease or Raynaud's Syndrome, who initially presented with chief complaint: left leg pain. Onset of symptom occurred months before her initial visit with Dr. Bridgett Larsson in November 2015, without any obvious triggers. Pain was described as cramping in thigh, calf and foot, severity 5-10/10, and associated with rest and ambulation. The patient also notes weakness in her left leg. She ambulated with a R BKA prosthesis. Patient has attempted to treat this pain with rest and stretches. The patient has no rest pain symptoms also and no leg wounds/ulcers. She reportedly had an angiogram years ago at another facility which demonstrated her arteries were too small to operate upon. Atherosclerotic risk factors include: DM, HTN, HLD, and prior smoking. Pt wears home oxygen by [place], 3L.  Dr. Bridgett Larsson last saw pt on 08/05/14. At that time Dr. Bridgett Larsson felt that pt's sx were not consistent with vasculogenic intermittent claudication. Her left ABI was normal. She has a history of left leg radiculopathy for which she received ESI's; this temporarily helped.   The pt denies any history of stroke or TIA.  She sees Dr. Aundra Dubin as part of her evaluation of dyspnea.  She has cramping in both thighs when not walking, she does not have claudication sx's in her left leg with walking, but does have burming pain in her left foot made worse by walking, she reports known neuropathy; her walking is also limited by dyspnea.  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  Pt states her blood pressures at home are138-140/60-70.  Pt Diabetic: Yes, states her last A1C was 6.?, in control Pt smoker: former smoker, quit in 1992  Pt meds include: Statin :No, had severe myalgias  to statins Betablocker: No ASA: Yes Other anticoagulants/antiplatelets: no   Current Outpatient Prescriptions  Medication Sig Dispense Refill  . aspirin EC 81 MG tablet Take 81 mg by mouth daily.      . BD PEN NEEDLE NANO U/F 32G X 4 MM MISC 2 (two) times daily. as directed  6  . Calcium Carb-Cholecalciferol (CALCIUM 1000 + D PO) Take 1,000 mg by mouth daily.    . cetirizine (ZYRTEC) 10 MG tablet Take 10 mg by mouth daily.    Marland Kitchen co-enzyme Q-10 30 MG capsule Take 1 capsule by mouth daily.    Marland Kitchen dexlansoprazole (DEXILANT) 60 MG capsule Take 1 capsule (60 mg total) by mouth daily.    . fluticasone (FLONASE) 50 MCG/ACT nasal spray Place 1 spray into the nose daily.    Marland Kitchen gabapentin (NEURONTIN) 100 MG capsule Take 100 mg by mouth 3 (three) times daily.    . halobetasol (ULTRAVATE) 0.05 % cream     . ibuprofen (ADVIL,MOTRIN) 200 MG tablet Take 600 mg by mouth every 6 (six) hours as needed. For pain    . Insulin Glargine (LANTUS SOLOSTAR) 100 UNIT/ML Solostar Pen Inject 55 Units into the skin daily after breakfast.     . Insulin Pen Needle (BD PEN NEEDLE NANO U/F) 32G X 4 MM MISC USE TWICE DAILY AS DIRECTED    . NIFEdipine (PROCARDIA XL/ADALAT-CC) 30 MG 24 hr tablet Take 30 mg by mouth 2 (two) times daily.     . OXYGEN 3 lpm with sleep and exertion  APS    .  Probiotic Product (PROBIOTIC ADVANCED PO) Take by mouth daily.    . SitaGLIPtin-MetFORMIN HCl (JANUMET XR) 50-1000 MG TB24 Take 1 tablet by mouth 2 (two) times daily.     . valsartan (DIOVAN) 160 MG tablet Take 160 mg by mouth daily.  11  . ZETIA 10 MG tablet Take 10 mg by mouth daily.  5   No current facility-administered medications for this visit.     Past Medical History:  Diagnosis Date  . Adenomatous polyp 12/04/2006  . Asthma   . COPD (chronic obstructive pulmonary disease) ([place])   . DM type 2 (diabetes mellitus, type 2) ([place])   . Hemorrhoid 12/04/2006  . Hyperlipidemia   . Hypertension   . Peripheral arterial disease [facility])      Social History Social History  Substance Use Topics  . Smoking status: Former Smoker    Packs/day: 0.50    Years: 18.00    Types: Cigarettes    Quit date: 09/16/1990  . Smokeless tobacco: Never Used  . Alcohol use No    Family History Family History  Problem Relation Age of Onset  . COPD Sister   . Diabetes Sister   . Breast cancer Sister     Mastectomy  . CVA Mother 31  . Heart disease Mother   . Diabetes Mother 74  . Lung cancer Father     lung carcinoma  . Colon cancer Neg Hx     Surgical History Past Surgical History:  Procedure Laterality Date  . CHOLECYSTECTOMY    . COLONOSCOPY  12/03/2006   Dr. Delight Ovens, adenomatous polyp  . COLONOSCOPY  03/25/2012   Procedure: COLONOSCOPY;  Surgeon: Daneil Dolin, MD;  Location: [facility];  Service: Endoscopy;  Laterality: N/A;  10:30  . ESOPHAGOGASTRODUODENOSCOPY  11/03/2002   Dr. Gala Romney- normal exam- was done to check for possible foreign body  . Fiberoptic bronchoscopy with endobronchial  ultrasound  10/22/2010   [place]  . Right BKA  1990  . RIGHT HEART CATHETERIZATION N/A 06/22/2014   Procedure: RIGHT HEART CATH;  Surgeon: Larey Dresser, MD;  Location: [facility] CATH LAB;  Service: Cardiovascular;  Laterality: N/A;    Allergies  Allergen Reactions  . Meloxicam Other (See Comments)    Causes excess Fluid buildup    Current Outpatient Prescriptions  Medication Sig Dispense Refill  . aspirin EC 81 MG tablet Take 81 mg by mouth daily.      . BD PEN NEEDLE NANO U/F 32G X 4 MM MISC 2 (two) times daily. as directed  6  . Calcium Carb-Cholecalciferol (CALCIUM 1000 + D PO) Take 1,000 mg by mouth daily.    . cetirizine (ZYRTEC) 10 MG tablet Take 10 mg by mouth daily.    Marland Kitchen co-enzyme Q-10 30 MG capsule Take 1 capsule by mouth daily.    Marland Kitchen dexlansoprazole (DEXILANT) 60 MG capsule Take 1 capsule (60 mg total) by mouth daily.    . fluticasone (FLONASE) 50 MCG/ACT nasal spray Place 1 spray into the nose daily.    Marland Kitchen  gabapentin (NEURONTIN) 100 MG capsule Take 100 mg by mouth 3 (three) times daily.    . halobetasol (ULTRAVATE) 0.05 % cream     . ibuprofen (ADVIL,MOTRIN) 200 MG tablet Take 600 mg by mouth every 6 (six) hours as needed. For pain    . Insulin Glargine (LANTUS SOLOSTAR) 100 UNIT/ML Solostar Pen Inject 55 Units into the skin daily after breakfast.     . Insulin Pen Needle (BD PEN NEEDLE  NANO U/F) 32G X 4 MM MISC USE TWICE DAILY AS DIRECTED    . NIFEdipine (PROCARDIA XL/ADALAT-CC) 30 MG 24 hr tablet Take 30 mg by mouth 2 (two) times daily.     . OXYGEN 3 lpm with sleep and exertion  APS    . Probiotic Product (PROBIOTIC ADVANCED PO) Take by mouth daily.    . SitaGLIPtin-MetFORMIN HCl (JANUMET XR) 50-1000 MG TB24 Take 1 tablet by mouth 2 (two) times daily.     . valsartan (DIOVAN) 160 MG tablet Take 160 mg by mouth daily.  11  . ZETIA 10 MG tablet Take 10 mg by mouth daily.  5   No current facility-administered medications for this visit.      REVIEW OF SYSTEMS: See HPI for pertinent positives and negatives.  Physical Examination Vitals:   08/16/16 1252 08/16/16 1304  BP: (!) 186/95 (!) 143/83  Pulse: 82   Resp: 16   Temp: 97.4 F (36.3 C)   SpO2: 94%   Weight: 211 lb 5 oz (95.9 kg)   Height: 5\' 9"  (1.753 m)    Body mass index is 31.21 kg/m.  General:  A&O x 3, WDWN, wearing [place], supplemental O2, obese female  Eyes: PERRLA  Pulmonary: Sym exp, respirations are non labored, fair air movt, CTAB, no rales, rhonchi, or wheezing  Cardiac: RRR, Nl S1, S2, no appreciable murmur  Vascular: Vessel Right Left  Radial 1+Palpable 2+Palpable  Carotid Palpable, with bruit Palpable, without bruit  Aorta Not palpable N/A  Femoral Not Palpable (obese) Not Palpable (obese)  Popliteal Not palpable Not palpable  PT BKA Not Palpable  DP BKA Not Palpable   Gastrointestinal: soft, NTND, -G/R, - HSM, - palpable masses, - CVAT B  Musculoskeletal: M/S 5/5  throughout except R BKA, Extremities without ischemic changes.  Neurologic: CN 2-12 intact , Pain and light touch intact in extremities except decreased sensation in L plantar , Motor exam as listed above  Psychiatric: Judgment intact, Mood & affect appropriate for pt's clinical situation  Dermatologic: See M/S exam for extremity exam, psoriasis patches on left leg     ASSESSMENT:  Debbie [name] is a 70 y.o. 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  She has not smoked since 1992 and her DM remains in control. She ambulates with her right BKA prosthesis and walker.  Right carotid bruit, no history of stroke or TIA, no carotid duplex result on file until today.  DATA Left ABI today is normal with bi and triphasic waveforms, TBI is 0.57.  Carotid duplex today suggests <40% bilateral ICA stenoses. Bilateral vertebral artery flow is antegrade. Bilateral subclavian artery waveforms are normal. No prior exam for comparison.  PLAN:   Based on today's exam and non-invasive vascular lab results, the patient will follow up in 1 year with the following tests: ABi's; 2 years with carotid duplex. I discussed in depth with the patient the nature of atherosclerosis, and emphasized the importance of maximal medical management including strict control of blood pressure, blood glucose, and lipid levels, obtaining regular exercise, and cessation of smoking.  The patient is aware that without maximal medical management the underlying atherosclerotic disease process will progress, limiting the benefit of any interventions.  The patient was given information about stroke prevention and what symptoms should prompt the patient to seek immediate medical care.  The patient was given  information about PAD including signs, symptoms, treatment, what symptoms should prompt the patient to  seek immediate medical care, and risk reduction measures to take. Thank you for allowing [place] to participate in this patient's care.  Clemon Chambers, RN, MSN, FNP-C [facility] Office: [phone]  Clinic MD: Bridgett Larsson 08/16/2016 1:07 PM

## 2016-08-16 NOTE — Patient Instructions (Signed)
Peripheral Vascular Disease Peripheral vascular disease (PVD) is a disease of the blood vessels that are not part of your heart and brain. A simple term for PVD is poor circulation. In most cases, PVD narrows the blood vessels that carry blood from your heart to the rest of your body. This can result in a decreased supply of blood to your arms, legs, and internal organs, like your stomach or kidneys. However, it most often affects a person's lower legs and feet. There are two types of PVD.  Organic PVD. This is the more common type. It is caused by damage to the structure of blood vessels.  Functional PVD. This is caused by conditions that make blood vessels contract and tighten (spasm). Without treatment, PVD tends to get worse over time. PVD can also lead to acute ischemic limb. This is when an arm or limb suddenly has trouble getting enough blood. This is a medical emergency. Follow these instructions at home:  Take medicines only as told by your doctor.  Do not use any tobacco products, including cigarettes, chewing tobacco, or electronic cigarettes. If you need help quitting, ask your doctor.  Lose weight if you are overweight, and maintain a healthy weight as told by your doctor.  Eat a diet that is low in fat and cholesterol. If you need help, ask your doctor.  Exercise regularly. Ask your doctor for some good activities for you.  Take good care of your feet.  Wear comfortable shoes that fit well.  Check your feet often for any cuts or sores. Contact a doctor if:  You have cramps in your legs while walking.  You have leg pain when you are at rest.  You have coldness in a leg or foot.  Your skin changes.  You are unable to get or have an erection (erectile dysfunction).  You have cuts or sores on your feet that are not healing. Get help right away if:  Your arm or leg turns cold and blue.  Your arms or legs become red, warm, swollen, painful, or numb.  You have  chest pain or trouble breathing.  You suddenly have weakness in your face, arm, or leg.  You become very confused or you cannot speak.  You suddenly have a very bad headache.  You suddenly cannot see. This information is not intended to replace advice given to you by your health care provider. Make sure you discuss any questions you have with your health care provider. Document Released: 11/27/2009 Document Revised: 02/08/2016 Document Reviewed: 02/10/2014 Elsevier Interactive Patient Education  2017 Elsevier Inc.    Stroke Prevention Some medical conditions and behaviors are associated with an increased chance of having a stroke. You may prevent a stroke by making healthy choices and managing medical conditions. How can I reduce my risk of having a stroke?  Stay physically active. Get at least 30 minutes of activity on most or all days.  Do not smoke. It may also be helpful to avoid exposure to secondhand smoke.  Limit alcohol use. Moderate alcohol use is considered to be:  No more than 2 drinks per day for men.  No more than 1 drink per day for nonpregnant women.  Eat healthy foods. This involves:  Eating 5 or more servings of fruits and vegetables a day.  Making dietary changes that address high blood pressure (hypertension), high cholesterol, diabetes, or obesity.  Manage your cholesterol levels.  Making food choices that are high in fiber and low in saturated fat,   trans fat, and cholesterol may control cholesterol levels.  Take any prescribed medicines to control cholesterol as directed by your health care provider.  Manage your diabetes.  Controlling your carbohydrate and sugar intake is recommended to manage diabetes.  Take any prescribed medicines to control diabetes as directed by your health care provider.  Control your hypertension.  Making food choices that are low in salt (sodium), saturated fat, trans fat, and cholesterol is recommended to manage  hypertension.  Ask your health care provider if you need treatment to lower your blood pressure. Take any prescribed medicines to control hypertension as directed by your health care provider.  If you are 18-39 years of age, have your blood pressure checked every 3-5 years. If you are 40 years of age or older, have your blood pressure checked every year.  Maintain a healthy weight.  Reducing calorie intake and making food choices that are low in sodium, saturated fat, trans fat, and cholesterol are recommended to manage weight.  Stop drug abuse.  Avoid taking birth control pills.  Talk to your health care provider about the risks of taking birth control pills if you are over 35 years old, smoke, get migraines, or have ever had a blood clot.  Get evaluated for sleep disorders (sleep apnea).  Talk to your health care provider about getting a sleep evaluation if you snore a lot or have excessive sleepiness.  Take medicines only as directed by your health care provider.  For some people, aspirin or blood thinners (anticoagulants) are helpful in reducing the risk of forming abnormal blood clots that can lead to stroke. If you have the irregular heart rhythm of atrial fibrillation, you should be on a blood thinner unless there is a good reason you cannot take them.  Understand all your medicine instructions.  Make sure that other conditions (such as anemia or atherosclerosis) are addressed. Get help right away if:  You have sudden weakness or numbness of the face, arm, or leg, especially on one side of the body.  Your face or eyelid droops to one side.  You have sudden confusion.  You have trouble speaking (aphasia) or understanding.  You have sudden trouble seeing in one or both eyes.  You have sudden trouble walking.  You have dizziness.  You have a loss of balance or coordination.  You have a sudden, severe headache with no known cause.  You have new chest pain or an  irregular heartbeat. Any of these symptoms may represent a serious problem that is an emergency. Do not wait to see if the symptoms will go away. Get medical help at once. Call your local emergency services (911 in [place] Do not drive yourself to the hospital. This information is not intended to replace advice given to you by your health care provider. Make sure you discuss any questions you have with your health care provider. Document Released: 10/10/2004 Document Revised: 02/08/2016 Document Reviewed: 03/05/2013 Elsevier Interactive Patient Education  2017 Elsevier Inc.   

## 2016-09-30 ENCOUNTER — Ambulatory Visit (INDEPENDENT_AMBULATORY_CARE_PROVIDER_SITE_OTHER): Payer: Medicare Other | Admitting: Internal Medicine

## 2016-09-30 ENCOUNTER — Encounter: Payer: Self-pay | Admitting: Internal Medicine

## 2016-09-30 VITALS — BP 142/80 | HR 99 | Ht 69.0 in | Wt 215.8 lb

## 2016-09-30 DIAGNOSIS — J9611 Chronic respiratory failure with hypoxia: Secondary | ICD-10-CM

## 2016-09-30 NOTE — Patient Instructions (Signed)
Please see patient coordinator before you leave today  to schedule evaluation for POC    If you are satisfied with your treatment plan,  let your doctor know and he/she can either refill your medications or you can return here when your prescription runs out.     If in any way you are not 100% satisfied,  please tell us.  If 100% better, tell your friends!  Pulmonary follow up is as needed

## 2016-09-30 NOTE — Progress Notes (Signed)
Subjective:     Patient ID: Debbie [name], female   DOB: 09/24/1945, 71 y.o.   MRN: [id number]    Brief patient profile:  19 yowf quit smoking 1990 with ? pna in 2011/2012  [place] all acute changes resolved p rx for pna but ever since has required 02 at hs/ and with activity and followed previously by Dr [name] for chronic resp failure but no airflow obst on spirometry     History of Present Illness  09/29/2015 1st  office visit/ W[facility]  Transition of care  Chief Complaint  Patient presents with  . Follow-up    Former Dr [name] pt. Breathing is unchanged. She does notice it gets worse with colder weather.   sob bending over / struggles to walk eg HT due to R leg prosthesis and doe  So has used scooter x years rec No change in recommendations for 02  Try to keep your weight trending down if at all possible to help your lower lobes get better airflow  Please schedule a follow up visit in 12 months but call sooner if needed     09/30/2016  f/u ov/Wert re:   MO/ 02 3lpm does increase to 5lpm sometimes with ex/  Doesn't always wear 02 at rest  Chief Complaint  Patient presents with  . Follow-up    1 year follow up, chronic respiratory failure, still SOB with exertion, hard to do everyday activities, on 3L of O2. recent illness, Rx'd Zpak and mucinex, otherwise breathing ok   MMRC3 = can't walk 100 yards even at a slow pace at a flat grade s stopping due to sob  Even on 02 Usually sleeping well  Just finished zpak and was taking  symbicort prn but not now and only feels she needs it with URI's    No obvious day to day or daytime variability or assoc chronic cough or cp or chest tightness, subjective wheeze or overt sinus or hb symptoms. No unusual exp hx or h/o childhood pna/ asthma or knowledge of premature birth.  Sleeping ok without nocturnal  or early am exacerbation  of respiratory  c/o's or need for noct saba. Also denies any obvious fluctuation of symptoms with weather  or environmental changes or other aggravating or alleviating factors except as outlined above   Current Medications, Allergies, Complete Past Medical History, Past Surgical History, Family History, and Social History were reviewed in [employer] record.  ROS  The following are not active complaints unless bolded sore throat, dysphagia, dental problems, itching, sneezing,  nasal congestion or excess/ purulent secretions, ear ache,   fever, chills, sweats, unintended wt loss, classically pleuritic or exertional cp, hemoptysis,  orthopnea pnd or leg swelling, presyncope, palpitations, abdominal pain, anorexia, nausea, vomiting, diarrhea  or change in bowel or bladder habits, change in stools or urine, dysuria,hematuria,  rash, arthralgias, visual complaints, headache, numbness, weakness or ataxia or problems with walking or coordination,  change in mood/affect or memory.               Objective:   Physical Exam    amb obese wf nad     09/30/2016       216   09/29/15 221 lb 6.4 oz (100.426 kg)  08/16/15 214 lb (97.07 kg)  05/17/15 216 lb 8 oz (98.204 kg)    Vital signs reviewed - Note on arrival 02 sats  92% on  3lpm      HEENT: nl dentition, turbinates,  and oropharynx. Nl external ear canals without cough reflex   NECK :  without JVD/Nodes/TM/ nl carotid upstrokes bilaterally   LUNGS: no acc muscle use,  Nl contour chest which is clear to A and P bilaterally without cough on insp or exp maneuvers   CV:  RRR  no s3 or murmur or increase in P2, no edema   ABD:  soft and nontender with nl inspiratory excursion in the supine position. No bruits or organomegaly, bowel sounds nl  MS:  Nl gait/ ext warm without deformities, calf tenderness, cyanosis or clubbing No obvious joint restrictions   SKIN: warm and dry without lesions    NEURO:  alert, approp, nl sensorium with  no motor deficits          Assessment:

## 2016-10-01 ENCOUNTER — Encounter: Payer: Self-pay | Admitting: Internal Medicine

## 2016-10-01 NOTE — Assessment & Plan Note (Signed)
Complicated by HBP/ Hyperlipidemia/ DM  Body mass index is 31.87 trending down > encouraged to continue  No results found for: TSH   Contributing to gerd tendency/ doe/reviewed the need and the process to achieve and maintain neg calorie balance > defer f/u primary care including intermittently monitoring thyroid status

## 2016-10-01 NOTE — Assessment & Plan Note (Addendum)
ONO RA 03/2014:  Desat to 72% Ambulatory ox 2015:  desat with walking CT chest 04/2014 with no PE, no ISLD, mild basilar atx from obesity. PFTs 04/25/14   VC 2.82 (87%) no airflow obst/ ERV 42% and dlco 50% > corrects to 60% for alv vol V/Q 07/2014:  No chronic TE disease Echo with DD, unable to estimate PA pressures, but nothing overt suggestive of clinically significant pulmonary htn. [place] 06/2014:  PA 43/13, mean PA 25, PCWP 10, PVR 2 WU, negative shunt run.  - 09/29/2015   Walked 3lpm   2 laps @ 185 ft each stopped due to sob / slow pace no desats  No evidence of any active process other than obesity contributing to poor v/q matching/ tendency to atx in bases > rx is 02 / wt loss  No need for f/u unless required to recertify for 02  - will refer for POC to see  if eligible

## 2016-11-20 ENCOUNTER — Telehealth: Payer: Self-pay | Admitting: Internal Medicine

## 2016-11-20 NOTE — Telephone Encounter (Signed)
Spoke with the pt  She states that she broke her foot and unable to come in and walk  She is aware this needs to be done b/c [facility]PS already told her  She will call back for ov once she can walk comfortably

## 2017-02-20 ENCOUNTER — Encounter: Payer: Self-pay | Admitting: Internal Medicine

## 2017-03-24 ENCOUNTER — Ambulatory Visit (INDEPENDENT_AMBULATORY_CARE_PROVIDER_SITE_OTHER): Payer: Medicare Other | Admitting: Internal Medicine

## 2017-03-24 ENCOUNTER — Ambulatory Visit (INDEPENDENT_AMBULATORY_CARE_PROVIDER_SITE_OTHER)
Admission: RE | Admit: 2017-03-24 | Discharge: 2017-03-24 | Disposition: A | Discharge status: Home / Self Care | Payer: Medicare Other | Source: Ambulatory Visit | Attending: Internal Medicine | Admitting: Internal Medicine

## 2017-03-24 ENCOUNTER — Encounter: Payer: Self-pay | Admitting: Internal Medicine

## 2017-03-24 VITALS — BP 156/90 | HR 75 | Ht 68.5 in | Wt 211.0 lb

## 2017-03-24 DIAGNOSIS — G4733 Obstructive sleep apnea (adult) (pediatric): Secondary | ICD-10-CM | POA: Diagnosis not present

## 2017-03-24 DIAGNOSIS — J9611 Chronic respiratory failure with hypoxia: Secondary | ICD-10-CM

## 2017-03-24 DIAGNOSIS — I10 Essential (primary) hypertension: Secondary | ICD-10-CM

## 2017-03-24 NOTE — Patient Instructions (Addendum)
Please remember to go to the  x-ray department downstairs in the basement  for your tests - we will call you with the results when they are available.      Please schedule a follow up visit in 12  months but call sooner if needed  Add:  Needs 2d echo with bubble study and ono on 3lpm

## 2017-03-24 NOTE — Progress Notes (Signed)
Subjective:     Patient ID: Debbie [name], female   DOB: 1945/11/25, 71 y.o.   MRN: [id number]    Brief patient profile:  32   yowf quit smoking 1990 with ? pna in 2011/2012  [place] all acute changes resolved p rx for pna but ever since has required 02 at hs/ and with activity and followed previously by Dr Debbie [name] for chronic resp failure but no airflow obst on spirometry     History of Present Illness  09/29/2015 1st  office visit/ Debbie [name]  Transition of care  Chief Complaint  Patient presents with  . Follow-up    Former Dr Debbie [name] pt. Breathing is unchanged. She does notice it gets worse with colder weather.   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 Follow-up    1 year follow up, chronic respiratory failure, still SOB with exertion, hard to do everyday activities, on 3L of O2. recent illness, Rx'd Zpak and mucinex, otherwise breathing ok   MMRC3 = can't walk 100 yards even at a slow pace at a flat grade s stopping due to sob  Even on 02 Usually sleeping well  Just finished zpak and was taking  symbicort prn but not now and only feels she needs it with URI's  rec Please see patient coordinator before you leave today  to schedule evaluation for POC    03/24/2017  f/u ov/Debbie [name] re: MO / 0 2 dep sp remote pna  Chief Complaint  Patient presents with  . Follow-up    Pt states she is here for o2 recert for POC. She c/o occ dry cough if she talks for an extended length of time.        Doe still = MMRC3 even on 02 which she titrates up to 4lpm  with adequate sats   No obvious day to day or daytime variability or assoc excess/ purulent sputum or mucus plugs or hemoptysis or cp or chest tightness, subjective wheeze or overt sinus or hb symptoms. No unusual exp hx or h/o childhood pna/ asthma or knowledge of premature birth.  Sleeping ok without nocturnal  or early am exacerbation  of respiratory  c/o's or need for noct saba. Also denies any obvious fluctuation of symptoms with weather or environmental changes or other aggravating or alleviating factors except as outlined above   Current Medications, Allergies, Complete Past Medical History, Past Surgical History, Family History, and Social History were reviewed in [employer] record.  ROS  The following are not active complaints unless bolded sore throat, dysphagia, dental problems, itching, sneezing,  nasal congestion or excess/ purulent secretions, ear ache,   fever, chills, sweats, unintended wt loss, classically pleuritic or exertional cp,  orthopnea pnd or leg swelling, presyncope, palpitations, abdominal pain, anorexia, nausea, vomiting, diarrhea  or change in bowel or bladder habits, change in stools or urine, dysuria,hematuria,  rash, arthralgias, visual complaints, headache, numbness, weakness or ataxia or  problems with walking or coordination,  change in mood/affect or memory.                       Objective:   Physical Exam    amb obese wf nad     03/24/2017         211  09/30/2016       216   09/29/15 221 lb 6.4 oz (100.426 kg)  08/16/15 214 lb (97.07 kg)  05/17/15 216 lb 8 oz (98.204 kg)    Vital signs reviewed - Note on arrival 02 sats  92% on  3lpm      HEENT: nl dentition, turbinates, and oropharynx. Nl external ear canals without cough reflex   NECK :  without JVD/Nodes/TM/ nl carotid upstrokes bilaterally   LUNGS: no acc muscle use,  Nl contour chest with minimal decrease in bs at bases bilaterally s wheeze or rhonchi   CV:  RRR   no s3 or murmur or increase in P2, no edema   ABD:  soft and nontender with nl inspiratory excursion in the supine position. No bruits or organomegaly, bowel sounds nl  MS:  Nl gait/ ext warm without deformities, calf tenderness, cyanosis or clubbing S/p R BKA prosthesis   SKIN: warm and dry without lesions    NEURO:  alert, approp, nl sensorium with  no motor deficits     CXR PA and Lateral:   03/24/2017 :    I personally reviewed images and agree with radiology impression as follows:    mild increase interstitial markings with prominent PA's. No acute changes      Assessment:

## 2017-03-25 NOTE — Assessment & Plan Note (Signed)
Complicated by HBP/ Hyperlipidemia/ DM  Body mass index is 31.62 kg/m.  -  trending down slightly - encouraged No results found for: TSH   Contributing to gerd risk/ doe/reviewed the need and the process to achieve and maintain neg calorie balance > defer f/u primary care including intermittently monitoring thyroid status

## 2017-03-25 NOTE — Assessment & Plan Note (Signed)
Procardia not the best choice for vasodilator as it interferes more than any other CCB with v/q matching > consider instead amlodipine at next refill

## 2017-03-25 NOTE — Assessment & Plan Note (Signed)
ONO RA 03/2014:  Desat to 72% Ambulatory ox 2015:  desat with walking CT chest 04/2014 with no PE, no ISLD, mild basilar atx from obesity. PFTs 04/25/14   VC 2.82 (87%) no airflow obst/ ERV 42% and dlco 50% > corrects to 60% for alv vol V/Q 07/2014:  No chronic TE disease Echo with DD, unable to estimate PA pressures, but nothing overt suggestive of clinically significant pulmonary htn. [place] 06/2014:  PA 43/13, mean PA 25, PCWP 10, PVR 2 WU, negative shunt run.  - 09/29/2015   Walked 3lpm   2 laps @ 185 ft each stopped due to sob / slow pace no desats  - 03/24/2017 Patient Saturations on Room Air at Rest = 88%--increased to 96%3lpm pulsed o2  Concerned about risk of developing secondary PH here > needs echo this year with bubble study   For now rec 02 3lpm titrated up with activity to maintain sats > 90%

## 2017-03-25 NOTE — Assessment & Plan Note (Signed)
NPSG 08/2014:  AHI 9/hr  Completely intolerant of cpap.   Need to be sure she has adequate sats at hs > ono ordered on 3lpm

## 2017-03-27 ENCOUNTER — Telehealth: Payer: Self-pay | Admitting: *Deleted

## 2017-03-27 DIAGNOSIS — J9611 Chronic respiratory failure with hypoxia: Secondary | ICD-10-CM

## 2017-03-27 DIAGNOSIS — I272 Pulmonary hypertension, unspecified: Secondary | ICD-10-CM

## 2017-03-27 NOTE — Telephone Encounter (Signed)
LMTCB

## 2017-03-27 NOTE — Telephone Encounter (Signed)
MW  Please advise on another dx, resp failure is showing as a not covered dx for your 2D Echo bubble study.   FYI to nurse: The ono was ordered

## 2017-03-27 NOTE — Telephone Encounter (Signed)
MW  Please Advise-  Spoke with pt and she stated she is fine doing both test. Please verify what dx did you want to use

## 2017-03-27 NOTE — Telephone Encounter (Signed)
Dx resp failure already on problem list

## 2017-03-27 NOTE — Telephone Encounter (Signed)
LMOMTCB x 1 

## 2017-03-27 NOTE — Telephone Encounter (Signed)
[phone] pt calling back

## 2017-03-27 NOTE — Telephone Encounter (Signed)
-----   Message from Tanda Rockers, MD sent at 03/25/2017  1:28 PM EDT ----- After chart review rec  Needs 2d echo with bubble study and ono on 3lpm no rush on either

## 2017-03-27 NOTE — Telephone Encounter (Signed)
Patient returning call - she can be reached at [phone] -pr

## 2017-03-28 DIAGNOSIS — I272 Pulmonary hypertension, unspecified: Secondary | ICD-10-CM

## 2017-03-28 HISTORY — DX: Pulmonary hypertension, unspecified: I27.20

## 2017-03-28 NOTE — Telephone Encounter (Signed)
Bubble study associated with pulm htn went through  ONO already ordered

## 2017-03-28 NOTE — Telephone Encounter (Signed)
Let's try pulmonary hypertension then

## 2017-04-04 ENCOUNTER — Other Ambulatory Visit: Payer: Self-pay

## 2017-04-04 ENCOUNTER — Ambulatory Visit (HOSPITAL_COMMUNITY): Payer: Medicare Other | Attending: Cardiovascular Disease

## 2017-04-04 DIAGNOSIS — I081 Rheumatic disorders of both mitral and tricuspid valves: Secondary | ICD-10-CM | POA: Diagnosis not present

## 2017-04-04 DIAGNOSIS — I272 Pulmonary hypertension, unspecified: Secondary | ICD-10-CM | POA: Diagnosis not present

## 2017-04-07 ENCOUNTER — Telehealth: Payer: Self-pay | Admitting: Internal Medicine

## 2017-04-07 NOTE — Telephone Encounter (Signed)
Notes recorded by Tanda Rockers, MD on 04/05/2017 at 6:07 AM EDT Call patient : Study is c/w elevated pressures on both sides of heart probably related to hbp and possible osa both related to wt > await ono on 02 and possible re-eval by sleep medicine   Left message for patient to call back.

## 2017-04-07 NOTE — Progress Notes (Signed)
LMTCB

## 2017-04-07 NOTE — Telephone Encounter (Signed)
Patient is returning phone call-AJ  °

## 2017-04-08 NOTE — Telephone Encounter (Signed)
Pt is aware of results and voiced her understanding.  Pt states she is scheduled for ONO on Thursday. Nothing further needed at this time.

## 2017-05-12 ENCOUNTER — Encounter: Payer: Self-pay | Admitting: Internal Medicine

## 2017-05-27 ENCOUNTER — Telehealth: Payer: Self-pay | Admitting: Internal Medicine

## 2017-05-27 DIAGNOSIS — J9611 Chronic respiratory failure with hypoxia: Secondary | ICD-10-CM

## 2017-05-27 NOTE — Telephone Encounter (Signed)
Per MW- ONO on RA was abnormal, needs to continue on 3lpm with sleep and needs ONO repeated on 3lpm  Spoke with pt and notified of results per Dr. Melvyn Novas. Pt verbalized understanding and denied any questions. ONO ord

## 2017-08-22 ENCOUNTER — Ambulatory Visit (INDEPENDENT_AMBULATORY_CARE_PROVIDER_SITE_OTHER): Payer: Medicare Other | Admitting: Family

## 2017-08-22 ENCOUNTER — Ambulatory Visit (HOSPITAL_COMMUNITY)
Admission: RE | Admit: 2017-08-22 | Discharge: 2017-08-22 | Disposition: A | Discharge status: Home / Self Care | Payer: Medicare Other | Source: Ambulatory Visit | Attending: Family | Admitting: Family

## 2017-08-22 ENCOUNTER — Encounter: Payer: Self-pay | Admitting: Family

## 2017-08-22 VITALS — BP 154/87 | HR 77 | Temp 97.1°F | Resp 19 | Wt 206.6 lb

## 2017-08-22 DIAGNOSIS — Z89511 Acquired absence of right leg below knee: Secondary | ICD-10-CM | POA: Diagnosis not present

## 2017-08-22 DIAGNOSIS — I731 Thromboangiitis obliterans [Buerger's disease]: Secondary | ICD-10-CM | POA: Insufficient documentation

## 2017-08-22 DIAGNOSIS — R9439 Abnormal result of other cardiovascular function study: Secondary | ICD-10-CM | POA: Insufficient documentation

## 2017-08-22 DIAGNOSIS — R0989 Other specified symptoms and signs involving the circulatory and respiratory systems: Secondary | ICD-10-CM

## 2017-08-22 DIAGNOSIS — I6523 Occlusion and stenosis of bilateral carotid arteries: Secondary | ICD-10-CM | POA: Diagnosis not present

## 2017-08-22 NOTE — Patient Instructions (Signed)

## 2017-08-22 NOTE — Progress Notes (Signed)
[facility] [place]   CC: Follow up peripheral artery occlusive disease  History of Present Illness Debbie [name] is a 71 y.o. female patient of Dr. Bridgett Larsson who is s/p R BKA for Buerger's Disease or Raynaud's Syndrome, who initially presented with chief complaint: left leg pain. Onset of symptom occurred months before her initial visit with Dr. Bridgett Larsson in November 2015, without any obvious triggers. Pain was described as cramping in thigh, calf and foot, severity 5-10/10, and associated with rest and ambulation. The patient also notes weakness in her left leg. She ambulates with a R BKA prosthesis. Patient has attempted to treat this pain with rest and stretches. The patient has no rest pain symptoms, also and no leg wounds/ulcers. She reportedly had an angiogram years ago at another facility which demonstrated her arteries were too small to operate upon. Atherosclerotic risk factors include: DM, HTN, HLD, and prior smoking. Pt wears home oxygen by [place], 3L.   Dr. Bridgett Larsson last saw pt on 08/05/14. At that time Dr. Bridgett Larsson felt that pt's sx were not consistent with vasculogenic intermittent claudication. Her left ABI was normal. She has a history of left leg radiculopathy for which she received ESI's; this temporarily helped.   The pt denies any history of stroke or TIA.  She sees Dr. Aundra Dubin as part of her evaluation of dyspnea.  She has cramping in both thighs when not walking, she does not have claudication sx's in her left leg with walking, but does have burming pain in her left foot made worse by walking, she reports known neuropathy; her walking is also limited by dyspnea.  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 She tripped and fractured her right dorsal lateral foot in February 2018.   She has generalized psoriasis lesions.   Pt states her blood pressures at home are138-140/60-70.  Pt Diabetic: Yes, states her last A1C was 6.0, in control Pt smoker:  former smoker, quit in 1992  Pt meds include: Statin :No, had severe myalgias to statins Betablocker: No ASA: Yes Other anticoagulants/antiplatelets: no   Past Medical History:  Diagnosis Date  . Adenomatous polyp 12/04/2006  . Asthma   . COPD (chronic obstructive pulmonary disease) ([place])   . DM type 2 (diabetes mellitus, type 2) ([place])   . Hemorrhoid 12/04/2006  . Hyperlipidemia   . Hypertension   . Peripheral arterial disease ([place])     Social History Social History   Tobacco Use  . Smoking status: Former Smoker    Packs/day: 0.50    Years: 18.00    Pack years: 9.00    Types: Cigarettes    Last attempt to quit: 09/16/1990    Years since quitting: 26.9  . Smokeless tobacco: Never Used  Substance Use Topics  . Alcohol use: No    Alcohol/week: 0.0 oz  . Drug use: No    Family History Family History  Problem Relation Age of Onset  . COPD Sister   . Diabetes Sister   . Breast cancer Sister        Mastectomy  . CVA Mother 48  . Heart disease Mother   . Diabetes Mother 66  . Lung cancer Father        lung carcinoma  . Colon cancer Neg Hx     Past Surgical History:  Procedure Laterality Date  . CHOLECYSTECTOMY    . COLONOSCOPY  12/03/2006   Dr. Delight Ovens, adenomatous polyp  . COLONOSCOPY  03/25/2012   Procedure: COLONOSCOPY;  Surgeon:  Daneil Dolin, MD;  Location: [facility];  Service: Endoscopy;  Laterality: N/A;  10:30  . ESOPHAGOGASTRODUODENOSCOPY  11/03/2002   Dr. Gala Romney- normal exam- was done to check for possible foreign body  . Fiberoptic bronchoscopy with endobronchial  ultrasound  10/22/2010   [place]  . Right BKA  1990  . RIGHT HEART CATHETERIZATION N/A 06/22/2014   Procedure: RIGHT HEART CATH;  Surgeon: Larey Dresser, MD;  Location[facility] C[facility] LAB;  Service: Cardiovascular;  Laterality: N/A;    Allergies  Allergen Reactions  . Meloxicam Other (See Comments)    Causes excess Fluid buildup    Current Outpatient Medications  Medication Sig  Dispense Refill  . aspirin EC 81 MG tablet Take 81 mg by mouth daily.      . BD PEN NEEDLE NANO U/F 32G X 4 MM MISC 2 (two) times daily. as directed  6  . Calcium Carb-Cholecalciferol (CALCIUM 1000 + D PO) Take 1,000 mg by mouth daily.    . cetirizine (ZYRTEC) 10 MG tablet Take 10 mg by mouth daily.    Marland Kitchen co-enzyme Q-10 30 MG capsule Take 1 capsule by mouth daily.    Marland Kitchen dexlansoprazole (DEXILANT) 60 MG capsule Take 1 capsule (60 mg total) by mouth daily.    . fluticasone (FLONASE) 50 MCG/ACT nasal spray Place 1 spray into the nose daily.    Marland Kitchen gabapentin (NEURONTIN) 100 MG capsule Take 100 mg by mouth 3 (three) times daily.    . halobetasol (ULTRAVATE) 0.05 % cream     . ibuprofen (ADVIL,MOTRIN) 200 MG tablet Take 600 mg by mouth every 6 (six) hours as needed. For pain    . Insulin Glargine (BASAGLAR KWIKPEN) 100 UNIT/ML SOPN Inject 55 Units into the skin daily.    . Insulin Pen Needle (BD PEN NEEDLE NANO U/F) 32G X 4 MM MISC USE TWICE DAILY AS DIRECTED    . losartan (COZAAR) 100 MG tablet Take 100 mg by mouth daily.  5  . NIFEdipine (PROCARDIA XL/ADALAT-CC) 30 MG 24 hr tablet Take 30 mg by mouth 2 (two) times daily.     . OXYGEN 3 lpm with sleep and exertion  APS    . Probiotic Product (PROBIOTIC ADVANCED PO) Take by mouth daily.    . SitaGLIPtin-MetFORMIN HCl (JANUMET XR) 50-1000 MG TB24 Take 1 tablet by mouth 2 (two) times daily.     . valsartan (DIOVAN) 160 MG tablet Take 160 mg by mouth daily.  11  . VOLTAREN 1 % GEL APPLY 2 GM TO THE AFFECTED JOINT 4 TIMES DAILY AS NEEDED.  2   No current facility-administered medications for this visit.     ROS: See HPI for pertinent positives and negatives.   Physical Examination  Vitals:   08/22/17 1032 08/22/17 1035  BP: (!) 164/91 (!) 154/87  Pulse: 77   Resp: 19   Temp: (!) 97.1 F (36.2 C)   TempSrc: Oral   SpO2: 95%   Weight: 206 lb 9.6 oz (93.7 kg)    Body mass index is 30.96 kg/m.  General: A&O x 3, WDWN, wearing [place],  supplemental O2, obese female  Eyes: PERRLA  Pulmonary: Sym exp, respirations are non labored, fair air movt, CTAB, no rales, rhonchi, or wheezing  Cardiac: Regular rate and rhythm, no appreciable murmur  Vascular: Vessel Right Left  Radial 1+Palpable 2+Palpable  Carotid Palpable, with bruit Palpable, without bruit  Aorta Not palpable N/A  Femoral Not Palpable (obese) Not Palpable (obese)  Popliteal Not  palpable Not palpable  PT BKA Not Palpable  DP BKA Not Palpable   Gastrointestinal: soft, NTND, -G/R, - HSM, - palpable masses, - CVAT B  Musculoskeletal: M/S 5/5 throughout except R BKA, Extremities without ischemic changes.  Neurologic: CN 2-12 intact , Pain and light touch intact in extremities except decreased sensation in L plantar , Motor exam as listed above  Psychiatric: Judgment intact, Mood & affect appropriate for pt's clinical situation  Dermatologic: See M/S exam for extremity exam, psoriasis patches on left leg      ASSESSMENT: SONDOS WOLFMAN is a 71 y.o. 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  She has not smoked since 1992 and her DM remains in control. She ambulates with her right BKA prosthesis and walker. She has left calf pain after walking about 100 feet, relieved by rest. There are no signs of ischemia in her feet or legs.   Right carotid bruit, minimal bilateral ICA stenosis on 08-16-16. She has never had a stroke or TIA.   DATA  ABI (Date: 08/22/2017):  R: BKA   L:   ABI: 0.98 (was 1.10 on 08-16-16),   PT: mono  DP: mono  TBI: 0.49 (was 0.57)  Left ABI is unreliable due to lack of correlation with monophasic waveforms. TBI has declined.    Carotid duplex (08-16-16) suggests <40% bilateral ICA stenoses. Bilateral vertebral artery flow is antegrade. Bilateral subclavian artery  waveforms are normal. No prior exam for comparison.  PLAN:   Daily seated leg exercises as discussed and demonstrated. Graduated walking program discussed and how to achieve, as long as she does not have falling issues.   Based on today's exam and non-invasive vascular lab results, the patient will follow up in 1 year with the following tests: left ABI and carotid duplex. I advised her to notify us if she develops concerns re the circulation in her left foot or leg.   I discussed in depth with the patient the nature of atherosclerosis, and emphasized the importance of maximal medical management including strict control of blood pressure, blood glucose, and lipid levels, obtaining regular exercise, and continued cessation of smoking.  The patient is aware that without maximal medical management the underlying atherosclerotic disease process will progress, limiting the benefit of any interventions.  The patient was given information about PAD including signs, symptoms, treatment, what symptoms should prompt the patient to seek immediate medical care, and risk reduction measures to take.  Clemon Chambers, RN, MSN, FNP-C [facility] Phone: [phone]  Clinic MD: Cain/Chen  08/22/17 11:12 AM

## 2017-08-26 ENCOUNTER — Encounter: Payer: Self-pay | Admitting: Family

## 2017-09-08 NOTE — Addendum Note (Signed)
Addended by: Lianne Cure A on: 09/08/2017 11:32 AM   Modules accepted: Orders

## 2017-11-07 ENCOUNTER — Other Ambulatory Visit: Payer: Self-pay | Admitting: Otolaryngology

## 2017-11-07 DIAGNOSIS — R490 Dysphonia: Secondary | ICD-10-CM

## 2017-11-13 ENCOUNTER — Ambulatory Visit
Admission: RE | Admit: 2017-11-13 | Discharge: 2017-11-13 | Disposition: A | Discharge status: Home / Self Care | Payer: Medicare Other | Source: Ambulatory Visit | Attending: Otolaryngology | Admitting: Otolaryngology

## 2017-11-13 DIAGNOSIS — R490 Dysphonia: Secondary | ICD-10-CM

## 2018-03-24 ENCOUNTER — Encounter: Payer: Self-pay | Admitting: Internal Medicine

## 2018-03-24 ENCOUNTER — Ambulatory Visit (INDEPENDENT_AMBULATORY_CARE_PROVIDER_SITE_OTHER)
Admission: RE | Admit: 2018-03-24 | Discharge: 2018-03-24 | Disposition: A | Discharge status: Home / Self Care | Payer: Medicare Other | Source: Ambulatory Visit | Attending: Internal Medicine | Admitting: Internal Medicine

## 2018-03-24 ENCOUNTER — Ambulatory Visit (INDEPENDENT_AMBULATORY_CARE_PROVIDER_SITE_OTHER): Payer: Medicare Other | Admitting: Internal Medicine

## 2018-03-24 VITALS — BP 174/100 | HR 84 | Ht 68.5 in | Wt 201.6 lb

## 2018-03-24 DIAGNOSIS — J9611 Chronic respiratory failure with hypoxia: Secondary | ICD-10-CM

## 2018-03-24 DIAGNOSIS — G4733 Obstructive sleep apnea (adult) (pediatric): Secondary | ICD-10-CM

## 2018-03-24 DIAGNOSIS — I10 Essential (primary) hypertension: Secondary | ICD-10-CM

## 2018-03-24 NOTE — Patient Instructions (Addendum)
Wear 3lpm 24/7 > we will refer for POC    We sill schedule overnight 02 sats on 3 lpm to make sure that's adequate    Please remember to go to the  x-ray department downstairs in the basement  for your tests - we will call you with the results when they are available.      Please schedule a follow up visit in 3 months but call sooner if needed with pfts on return

## 2018-03-24 NOTE — Assessment & Plan Note (Addendum)
ONO RA 03/2014:  Desat to 72% Ambulatory ox 2015:  desat with walking CT chest 04/2014 with no PE, no ISLD, mild basilar atx from obesity. PFTs 04/25/14   VC 2.82 (87%) no airflow obst/ ERV 42% and dlco 50% > corrects to 60% for alv vol V/Q 07/2014:  No chronic TE disease Echo with DD, unable to estimate PA pressures, but nothing overt suggestive of clinically significant pulmonary htn. [place] 06/2014:  PA 43/13, mean PA 25, PCWP 10, PVR 2 WU, negative shunt run.  - 09/29/2015   Walked 3lpm   2 laps @ 185 ft each stopped due to sob / slow pace no desats  - 03/24/2017 Patient Saturations on Room Air at Rest = 88%--increased to 96%3lpm pulsed o2  - Echo 04/04/17 - Technically difficult study. Compared to a prior study in 2015, there is now moderate LVH and the LVEF is higher at 65-70%. No obvious PFO noted by saline microbubble contrast. There is moderate TR with an RVSP of 56 mmHg and a normal, collapsing IVC. Consistent with moderate pulmonary hypertension.  - ono RA  05/12/17  desat x 2: 55 min - 05/27/2017  rec 3lpm hs and repeat > ? Done ?> repeat  03/24/2018 >>>   - [facility]03   02/06/18  = 22   - 03/24/2018   Walked 3lpm Pulsed x one lap @ 185 stopped due to  Sob/ nl pace, sats 91% at end > referred for POC    All the evidence points to obesity s OHS as cause for desats and cor pulmonale which also may be partly related to diastolic dysfunction with elevated L ht pressures.  Main challenge is being sure she stays > 90% sats at all time/ no hypersomnolence issues to suggest worsening osa or need for repeat sleep study since has lost wt since prev study and did not tol cpap anyway. Key for noct is to be sure that 3lpm noct prevents significant  desats now.  I had an extended discussion with the patient reviewing all relevant studies completed to date and  lasting 15 to 20 minutes of a 25 minute visit    Each maintenance medication was reviewed in detail including most importantly the difference  between maintenance and prns and under what circumstances the prns are to be triggered using an action plan format that is not reflected in the computer generated alphabetically organized AVS.    Please see AVS for specific instructions unique to this visit that I personally wrote and verbalized to the the pt in detail and then reviewed with pt  by my nurse highlighting any  changes in therapy recommended at today's visit to their plan of care.

## 2018-03-24 NOTE — Progress Notes (Signed)
Spoke with pt and notified of results per Dr. Wert. Pt verbalized understanding and denied any questions. 

## 2018-03-24 NOTE — Assessment & Plan Note (Signed)
Not optimally controlled on present regimen. I reviewed this with the patient and emphasized importance of follow-up with primary care.     

## 2018-03-24 NOTE — Progress Notes (Signed)
Subjective:     Patient ID: Debbie [name], female   DOB: August 11, 1946, 72 y.o.   MRN: [id number]    Brief patient profile:  52    yowf quit smoking 1990 with ? pna in 2011/2012  [place] > [place] all acute changes resolved p rx for pna but ever since has required 02 at hs/ and with activity and followed previously by Dr [name] for chronic resp failure but no airflow obst on spirometry     History of Present Illness  09/29/2015 1st  office visit/ Yaniv Lage  Transition of care  Chief Complaint  Patient presents with  . Follow-up    Former Dr [name] pt. Breathing is unchanged. She does notice it gets worse with colder weather.   sob bending over / struggles to walk eg HT due to R leg prosthesis and doe  So has used scooter x years rec No change in recommendations for 02  Try to keep your weight trending down if at all possible to help your lower lobes get better airflow  Please schedule a follow up visit in 12 months but call sooner if needed     09/30/2016  f/u ov/Seira Cody re:   MO/ 02 3lpm does increase to 5lpm sometimes with ex/  Doesn't always wear 02 at rest  Chief Complaint  Patient presents with  . Follow-up    1 year follow up, chronic respiratory failure, still SOB with exertion, hard to do everyday activities, on 3L of O2. recent illness, Rx'd Zpak and mucinex, otherwise breathing ok   MMRC3 = can't walk 100 yards even at a slow pace at a flat grade s stopping due to sob  Even on 02 Usually sleeping well  Just finished zpak and was taking  symbicort prn but not now and only feels she needs it with URI's  rec Please see patient coordinator before you leave today  to schedule evaluation for POC    03/24/2017  f/u ov/Wirt Hemmerich re: MO / 0 2 dep sp remote pna  Chief Complaint  Patient presents with  . Follow-up    Pt states she is here for o2 recert for POC. She c/o occ dry cough if she talks for an extended length of time.   Doe still = MMRC3 even on 02 which she titrates up to 4lpm with  adequate sats  rec Please schedule a follow up visit in 12  months but call sooner if needed     04/04/17 Compared to a prior study in 2015,   there is now moderate LVH and the LVEF is higher at 65-70%. No   obvious PFO noted by saline microbubble contrast. There is   moderate TR with an RVSP of 56 mmHg and a normal, collapsing IVC.   Consistent with moderate pulmonary hypertension.    03/24/2018  f/u ov/Khristy Kalan re:  Chronic resp failure ? Cor pulmonale related to obesity s hypoventilation  Chief Complaint  Patient presents with  . Follow-up     on 3L with exertion and at night, increased bp, stopped losartan, never recieved her portable tank, increased niffedical makes her legs burn when it was increases, face feels hot, stayed on 30 mg   Dyspnea:  3lpm [employer] only = MMRC3 = can't walk 100 yards even at a slow pace at a flat grade s stopping due to sob   Cough: sensation of drainage/ f/u by ENT Gso rec gi eval [place] Sleeping: on either side/ feels nauseated all her life if  lies on back    02: 3lpm 24/7   No obvious day to day or daytime variability or assoc excess/ purulent sputum or mucus plugs or hemoptysis or cp or chest tightness, subjective wheeze or overt sinus or hb symptoms.   Also denies any obvious fluctuation of symptoms with weather or environmental changes or other aggravating or alleviating factors except as outlined above   No unusual exposure hx or h/o childhood pna/ asthma or knowledge of premature birth.  Current Allergies, Complete Past Medical History, Past Surgical History, Family History, and Social History were reviewed in [employer] record.  ROS  The following are not active complaints unless bolded Hoarseness, sore throat, dysphagia, dental problems, itching, sneezing,  nasal congestion or discharge of excess mucus or purulent secretions, ear ache,   fever, chills, sweats, unintended wt loss or wt gain, classically pleuritic or  exertional cp,  orthopnea pnd or arm/hand swelling  or leg swelling, presyncope, palpitations, abdominal pain, anorexia, nausea, vomiting, diarrhea  or change in bowel habits or change in bladder habits, change in stools or change in urine, dysuria, hematuria,  rash, arthralgias, visual complaints, headache not worse in am, numbness, weakness or ataxia or problems with walking or coordination,  change in mood or  memory.        Current Meds  Medication Sig  . aspirin EC 81 MG tablet Take 81 mg by mouth daily.    . BD PEN NEEDLE NANO U/F 32G X 4 MM MISC 2 (two) times daily. as directed  . Calcium Carb-Cholecalciferol (CALCIUM 1000 + D PO) Take 1,000 mg by mouth daily.  . cetirizine (ZYRTEC) 10 MG tablet Take 10 mg by mouth daily.  Marland Kitchen co-enzyme Q-10 30 MG capsule Take 1 capsule by mouth daily.  Marland Kitchen dexlansoprazole (DEXILANT) 60 MG capsule Take 1 capsule (60 mg total) by mouth daily.  . fluticasone (FLONASE) 50 MCG/ACT nasal spray Place 1 spray into the nose daily.  Marland Kitchen gabapentin (NEURONTIN) 100 MG capsule Take 100 mg by mouth 3 (three) times daily.  . halobetasol (ULTRAVATE) 0.05 % cream   . ibuprofen (ADVIL,MOTRIN) 200 MG tablet Take 600 mg by mouth every 6 (six) hours as needed. For pain  . Insulin Pen Needle (BD PEN NEEDLE NANO U/F) 32G X 4 MM MISC USE TWICE DAILY AS DIRECTED  . NIFEdipine (PROCARDIA XL/ADALAT-CC) 30 MG 24 hr tablet Take 30 mg by mouth 2 (two) times daily.   . OXYGEN 3 lpm with sleep and exertion  APS  . Probiotic Product (PROBIOTIC ADVANCED PO) Take by mouth daily.  . SitaGLIPtin-MetFORMIN HCl (JANUMET XR) 50-1000 MG TB24 Take 1 tablet by mouth 2 (two) times daily.   . valsartan (DIOVAN) 160 MG tablet Take 160 mg by mouth daily.  . VOLTAREN 1 % GEL APPLY 2 GM TO THE AFFECTED JOINT 4 TIMES DAILY AS NEEDED.  .                             Objective:   Physical Exam    amb obese somber wf nad    03/24/2018        201 03/24/2017         211  09/30/2016       216    09/29/15 221 lb 6.4 oz (100.426 kg)  08/16/15 214 lb (97.07 kg)  05/17/15 216 lb 8 oz (98.204 kg)    Vital signs reviewed - Note on arrival  02 sats  95% on 3lpm pulsed and note bp 174/100     HEENT: nl dentition, turbinates bilaterally, and oropharynx. Nl external ear canals without cough reflex   NECK :  without JVD/Nodes/TM/ nl carotid upstrokes bilaterally   LUNGS: no acc muscle use,  Nl contour chest which is clear to A and P bilaterally without cough on insp or exp maneuvers   CV:  RRR  no s3 or murmur - ? slt increase in P2 -  and no edema   ABD:  Obese/ soft and nontender with nl inspiratory excursion in the supine position. No bruits or organomegaly appreciated, bowel sounds nl  MS:  Nl gait/ ext warm without deformities, calf tenderness, cyanosis or clubbing No obvious joint restrictions / s/p R BKA prosthesis   SKIN: warm and dry without lesions    NEURO:  alert, approp, nl sensorium with  no motor or cerebellar deficits apparent.       CXR PA and Lateral:   03/24/2018 :    I personally reviewed images and agree with radiology impression as follows:    No active cardiopulmonary disease.         Assessment:

## 2018-03-24 NOTE — Assessment & Plan Note (Signed)
NPSG 08/2014:  AHI 9/hr  But Completely intolerant of cpap.  - ono on 3lpm 03/25/2017 >>>   No hypersomnolence or AM HA > continue 3lpm but verify sats with ono on 3lpm ordered 03/24/2018

## 2018-03-25 ENCOUNTER — Encounter: Payer: Self-pay | Admitting: Internal Medicine

## 2018-03-31 ENCOUNTER — Encounter: Payer: Self-pay | Admitting: Physical Therapy

## 2018-03-31 ENCOUNTER — Other Ambulatory Visit: Payer: Self-pay | Admitting: Internal Medicine

## 2018-03-31 ENCOUNTER — Ambulatory Visit: Payer: Medicare Other | Attending: Orthopedic Surgery | Admitting: Physical Therapy

## 2018-03-31 ENCOUNTER — Other Ambulatory Visit: Payer: Self-pay

## 2018-03-31 DIAGNOSIS — M25672 Stiffness of left ankle, not elsewhere classified: Secondary | ICD-10-CM

## 2018-03-31 DIAGNOSIS — R2689 Other abnormalities of gait and mobility: Secondary | ICD-10-CM | POA: Diagnosis present

## 2018-03-31 DIAGNOSIS — M25572 Pain in left ankle and joints of left foot: Secondary | ICD-10-CM

## 2018-03-31 DIAGNOSIS — J9611 Chronic respiratory failure with hypoxia: Secondary | ICD-10-CM

## 2018-04-01 NOTE — Therapy (Addendum)
[place], [place], [address] Phone: [phone]   Fax:  [phone]  Physical Therapy Evaluation  Patient Details  Name: Debbie [name] MRN: [id number] Date of Birth: 1945-09-27 Referring Provider: Mechele Claude PA-C    Encounter Date: 03/31/2018  PT End of Session - 03/31/18 1541    Visit Number  1    Number of Visits  16    Date for PT Re-Evaluation  05/26/18    Authorization Type  Medicare/ [employer]     PT Start Time  1330    PT Stop Time  1415    PT Time Calculation (min)  45 min    Activity Tolerance  Patient tolerated treatment well    Behavior During Therapy  [facility] for tasks assessed/performed       Past Medical History:  Diagnosis Date  . Adenomatous polyp 12/04/2006  . Asthma   . COPD (chronic obstructive pulmonary disease) ([place])   . DM type 2 (diabetes mellitus, type 2) ([place])   . Hemorrhoid 12/04/2006  . Hyperlipidemia   . Hypertension   . Peripheral arterial disease [facility])     Past Surgical History:  Procedure Laterality Date  . CHOLECYSTECTOMY    . COLONOSCOPY  12/03/2006   Dr. Delight Ovens, adenomatous polyp  . COLONOSCOPY  03/25/2012   Procedure: COLONOSCOPY;  Surgeon: Daneil Dolin, MD;  Location: [facility] SU[facility];  Service: Endoscopy;  Laterality: N/A;  10:30  . ESOPHAGOGASTRODUODENOSCOPY  11/03/2002   Dr. Gala Romney- normal exam- was done to check for possible foreign body  . Fiberoptic bronchoscopy with endobronchial  ultrasound  10/22/2010   [place]  . Right BKA  1990  . RIGHT HEART CATHETERIZATION N/A 06/22/2014   Procedure: RIGHT HEART CATH;  Surgeon: Larey Dresser, MD;  Location: [facility] CATH LAB;  Service: Cardiovascular;  Laterality: N/A;    There were no vitals filed for this visit.   Subjective Assessment - 03/31/18 1335    Subjective  Patient fractured her foot over a year ago. Since that point she has had consitent pain in her medial and lateral foot. The MD has done a  follow up x-ray and the fracture is well healed. There appears to be tendinitids and plantar faciitus.     Limitations  Standing;Walking    How long can you stand comfortably?  Just a few minutes     How long can you walk comfortably?  Has to ride a scooter to go shopping; can walk to the kitchen and the bathroom    Currently in Pain?  Yes    Pain Score  9  when she is not standing on it it is about a 1-2/10     Pain Location  Ankle    Pain Orientation  Left;Lateral;Medial    Pain Descriptors / Indicators  Aching    Pain Type  Chronic pain    Pain Onset  More than a month ago    Pain Frequency  Rarely    Aggravating Factors   standing and walking     Pain Relieving Factors  rest     Effect of Pain on Daily Activities  difficulty perfroming ADL's          [facility] PT Assessment - 04/01/18 0001      Assessment   Medical Diagnosis  Left Ankle     Referring Provider  Mechele Claude PA-C     Onset Date/Surgical Date  -- February 2018     Hand  Dominance  Right    Next MD Visit  August 22nd     Prior Therapy  No therapy just wore the boot after the intial accident       Precautions   Precautions  None    Precaution Comments  Has a prosthetic leg on the right side      Restrictions   Weight Bearing Restrictions  No      Balance Screen   Has the patient fallen in the past 6 months  No    Has the patient had a decrease in activity level because of a fear of falling?   No    Is the patient reluctant to leave their home because of a fear of falling?   No      Home Environment   Additional Comments  No steps into her house       Prior Function   Level of Independence  Independent    Vocation  Retired    Leisure  Nothing       Cognition   Overall Cognitive Status  Within Functional Limits for tasks assessed    Attention  Focused    Focused Attention  Appears intact    Memory  Appears intact    Awareness  Appears intact    Problem Solving  Appears intact      Observation/Other  Assessments   Observations  prothetic on the left     Focus on Therapeutic Outcomes (FOTO)   52% limitation       Sensation   Additional Comments  neuropathy of the left foot.       Coordination   Gross Motor Movements are Fluid and Coordinated  Yes    Fine Motor Movements are Fluid and Coordinated  Yes      AROM   Right/Left Ankle  Left    Left Ankle Dorsiflexion  -10    Left Ankle Inversion  20    Left Ankle Eversion  10      PROM   Right/Left Ankle  Left    Left Ankle Dorsiflexion  -5    Left Ankle Inversion  24 pain     Left Ankle Eversion  13 pain       Strength   Left Hip Flexion  4/5    Left Hip ABduction  4/5    Left Hip ADduction  4+/5    Left Knee Flexion  4+/5    Left Knee Extension  4/5    Left Ankle Dorsiflexion  4/5    Left Ankle Plantar Flexion  -- unable to assess     Left Ankle Inversion  3+/5    Left Ankle Eversion  3+/5      Palpation   Palpation comment  tenderness to palpation in the anterior lateral portion of the ankle; tenderness to palaption around the medial calcaneal tuberosity      Ambulation/Gait   Gait Comments  weight shift to the left; decreased left single leg stance; decreased left heel strike       High Level Balance   High Level Balance Comments  tandem stance min a                 Objective measurements completed on examination: See above findings.      [facility] PT Treatment/Exercise - 04/01/18 0001      Ankle Exercises: Stretches   Other Stretch  gastroc stretch 3x20 sec hold; plkantar facia stretch 3x10 sec hold  Ankle Exercises: Seated   Other Seated Ankle Exercises  ankle pump in pain free range x10     Other Seated Ankle Exercises  ankle ev/ IV x10; reviewed frozen bottle roll         ALL services listed in the Assessment and Treatment portions of the note were performed on the encounter date of 03/31/18. Documentation was completed/signed on 04/01/18."       PT Education - 03/31/18 1541     Education Details  reviewed HEP; symptom mangement; improtance of improving DF     Person(s) Educated  Patient    Methods  Explanation;Demonstration;Tactile cues;Verbal cues    Comprehension  Verbalized understanding;Returned demonstration;Verbal cues required;Tactile cues required       PT Short Term Goals - 03/31/18 1542      PT SHORT TERM GOAL #1   Title  Patient will increase left passive DF by 7 degrees     Time  4    Period  Weeks    Status  New    Target Date  04/28/18      PT SHORT TERM GOAL #2   Title  Patient will increase gross left ankle Ev and IV strength to 4+/5     Time  4    Period  Weeks    Status  New    Target Date  04/28/18      PT SHORT TERM GOAL #3   Title  Patient will be inependent with basic HEP     Time  4    Period  Weeks    Status  New    Target Date  04/28/18        PT Long Term Goals - 03/31/18 1621      PT LONG TERM GOAL #1   Title  Patient will stand for 10 minutes without slef report of pain in left ankle in order to perfrom ADL's     Time  8    Period  Weeks    Status  New    Target Date  05/27/18      PT LONG TERM GOAL #2   Title  Patient will demsotrate 5 degrees of active DF in order to improve gait technique and decrease amount the plantar facia stretches     Time  8    Period  Weeks    Status  New    Target Date  05/27/18      PT LONG TERM GOAL #3   Title  Patient will demsotrate a 52% limitation on FOTO     Time  8    Period  Weeks    Status  New    Target Date  05/27/18             Plan - 04/01/18 1256    Clinical Impression Statement  Patient is a 72 year old female with significant left foot pain. She has a history of a left ankle fracture in February of 2018. She presents with weakness of the her left ankle, knee and hip. She uses her cane on the left side 2nd to her porsthesis. Signsa and symptoms are consitetn with plantar facitis and peroneal tendinits. She has limited dorsi flexion and can not come to  neutral with PROM. She would benefit from skilled therapy to improve DF and improve left leg stability.     History and Personal Factors relevant to plan of care:  right BKA; left neuropathy     Clinical Presentation  Evolving    Clinical Presentation due to:  pain that is increasing with time     Clinical Decision Making  Moderate    Rehab Potential  Fair    Clinical Impairments Affecting Rehab Potential  baseline neuropathy and R LE dysfunction     PT Frequency  2x / week    PT Duration  8 weeks    PT Treatment/Interventions  ADLs/Self Care Home Management;Cryotherapy;Electrical Stimulation;Iontophoresis 4mg /ml Dexamethasone;Ultrasound;Moist Heat;Gait training;Stair training;Therapeutic activities;Therapeutic exercise;Patient/family education;Manual techniques;Passive range of motion;Dry needling;Taping    PT Next Visit Plan  manual therapy to ankle and calfto improve DF; consitder IASTYM; continue with AROM of the ankle; add strengthening to ankle when able; consider standing weight shifting; modalities PRN; consider sested rocker board; plkantar facia stretch     PT Home Exercise Plan  ankle pumps; windshield wipers; ankle DF stretch; plantar flexion stretch       Patient will benefit from skilled therapeutic intervention in order to improve the following deficits and impairments:  Abnormal gait, Pain, Decreased activity tolerance, Decreased endurance, Decreased range of motion, Decreased strength, Difficulty walking  Visit Diagnosis: Pain in left ankle and joints of left foot  Stiffness of left ankle, not elsewhere classified  Other abnormalities of gait and mobility     Problem List Patient Active Problem List   Diagnosis Date Noted  . Pulmonary hypertension ([place]) 03/28/2017  . Morbid obesity due to excess calories ([place]) 09/29/2015  . OSA (obstructive sleep apnea) 09/23/2014  . Atherosclerosis of native arteries of extremity with intermittent claudication ([place]) 08/05/2014  .  Chronic respiratory failure with hypoxia ([place]) 04/25/2014  . DOE (dyspnea on exertion) 04/01/2014  . Encounter for colonoscopy due to history of adenomatous colonic polyps 02/28/2012  . GERD (gastroesophageal reflux disease) 02/28/2012  . DM type 2 (diabetes mellitus, type 2) ([place])   . Hyperlipidemia   . Essential hypertension     Carney Living  PT DPT  04/01/2018, 1:15 PM  [facility] [address] [place], [place], [address] Phone: [phone]   Fax:  [phone]  Name: Debbie [name] MRN: [id number] Date of Birth: 1946-04-03

## 2018-04-16 ENCOUNTER — Encounter: Payer: Self-pay | Admitting: Physical Therapy

## 2018-04-16 ENCOUNTER — Ambulatory Visit: Payer: Medicare Other | Attending: Orthopedic Surgery | Admitting: Physical Therapy

## 2018-04-16 DIAGNOSIS — M25572 Pain in left ankle and joints of left foot: Secondary | ICD-10-CM

## 2018-04-16 DIAGNOSIS — M25672 Stiffness of left ankle, not elsewhere classified: Secondary | ICD-10-CM

## 2018-04-16 DIAGNOSIS — R2689 Other abnormalities of gait and mobility: Secondary | ICD-10-CM

## 2018-04-16 NOTE — Therapy (Signed)
[place], [place], [address] Phone: [phone]   Fax:  [phone]  Physical Therapy Treatment  Patient Details  Name: Debbie [name] MRN: [id number] Date of Birth: December 08, 1945 Referring Provider: Mechele Claude PA-C    Encounter Date: 04/16/2018  PT End of Session - 04/16/18 0841    Visit Number  2    Number of Visits  16    Date for PT Re-Evaluation  05/26/18    Authorization Type  Medicare/ [employer]     PT Start Time  0933    PT Stop Time  1015    PT Time Calculation (min)  42 min    Activity Tolerance  Patient tolerated treatment well    Behavior During Therapy  [facility] for tasks assessed/performed       Past Medical History:  Diagnosis Date  . Adenomatous polyp 12/04/2006  . Asthma   . COPD (chronic obstructive pulmonary disease) ([place])   . DM type 2 (diabetes mellitus, type 2) ([place])   . Hemorrhoid 12/04/2006  . Hyperlipidemia   . Hypertension   . Peripheral arterial disease [facility])     Past Surgical History:  Procedure Laterality Date  . CHOLECYSTECTOMY    . COLONOSCOPY  12/03/2006   Dr. Delight Ovens, adenomatous polyp  . COLONOSCOPY  03/25/2012   Procedure: COLONOSCOPY;  Surgeon: Daneil Dolin, MD;  [facility];  Service: Endoscopy;  Laterality: N/A;  10:30  . ESOPHAGOGASTRODUODENOSCOPY  11/03/2002   Dr. Gala Romney- normal exam- was done to check for possible foreign body  . Fiberoptic bronchoscopy with endobronchial  ultrasound  10/22/2010   [place]  . Right BKA  1990  . RIGHT HEART CATHETERIZATION N/A 06/22/2014   Procedure: RIGHT HEART CATH;  Surgeon: Larey Dresser, MD;  Location: [facility] CATH LAB;  Service: Cardiovascular;  Laterality: N/A;    There were no vitals filed for this visit.  Subjective Assessment - 04/16/18 1127    Subjective  Patient reports her pain and swelling have improved significantly. She is working on her stretching and active motion at home.     Limitations   Standing;Walking    How long can you stand comfortably?  Just a few minutes     How long can you walk comfortably?  Has to ride a scooter to go shopping; can walk to the kitchen and the bathroom    Currently in Pain?  Yes    Pain Score  5     Pain Location  Ankle    Pain Orientation  Right;Left;Medial    Pain Descriptors / Indicators  Aching    Pain Type  Chronic pain    Pain Onset  More than a month ago    Pain Frequency  Rarely    Aggravating Factors   standing and walking     Pain Relieving Factors  rest     Effect of Pain on Daily Activities  difficulty perfroming ADL's                        [facility] Adult PT Treatment/Exercise - 04/16/18 0001      Manual Therapy   Manual Therapy  Joint mobilization;Manual Traction;Soft tissue mobilization;Passive ROM    Joint Mobilization  AP glides    Soft tissue mobilization  trigger point release to right calf; plantar flacia light stretch and soft tissue mobilization    Passive ROM  left passive DF stretch  Ankle Exercises: Stretches   Other Stretch  gastroc stretch 3x20 sec hold; plkantar facia stretch 3x10 sec hold       Ankle Exercises: Seated   Other Seated Ankle Exercises  seated rocker board forward and back 2x10; side to side 2x10     Other Seated Ankle Exercises  towel scrunch x10 x5       Ankle Exercises: Supine   T-Band  ankle PF/ DF yellow 2x10       Ankle Exercises: Standing   Other Standing Ankle Exercises  weight shift side to side 2x10; forward and back 2x10              PT Education - 04/16/18 1129    Education Details  reviewed stretching and use of T-band     Person(s) Educated  Patient    Methods  Explanation;Demonstration;Tactile cues;Verbal cues    Comprehension  Verbalized understanding;Returned demonstration;Verbal cues required;Tactile cues required       PT Short Term Goals - 03/31/18 1542      PT SHORT TERM GOAL #1   Title  Patient will increase left passive DF by 7 degrees      Time  4    Period  Weeks    Status  New    Target Date  04/28/18      PT SHORT TERM GOAL #2   Title  Patient will increase gross left ankle Ev and IV strength to 4+/5     Time  4    Period  Weeks    Status  New    Target Date  04/28/18      PT SHORT TERM GOAL #3   Title  Patient will be inependent with basic HEP     Time  4    Period  Weeks    Status  New    Target Date  04/28/18        PT Long Term Goals - 03/31/18 1621      PT LONG TERM GOAL #1   Title  Patient will stand for 10 minutes without slef report of pain in left ankle in order to perfrom ADL's     Time  8    Period  Weeks    Status  New    Target Date  05/27/18      PT LONG TERM GOAL #2   Title  Patient will demsotrate 5 degrees of active DF in order to improve gait technique and decrease amount the plantar facia stretches     Time  8    Period  Weeks    Status  New    Target Date  05/27/18      PT LONG TERM GOAL #3   Title  Patient will demsotrate a 52% limitation on FOTO     Time  8    Period  Weeks    Status  New    Target Date  05/27/18            Plan - 04/16/18 1130    Clinical Impression Statement  Per visual inspection the patients passive DF and ER have improved significantly. She has limited swelling today. Therapy added light restance to DF/PF and added stabing and seated weight shifting. She reported no increase in pain after treatment. Therapy will continue to progress as tolerated.     Clinical Presentation  Evolving    Clinical Decision Making  Moderate    Rehab Potential  Fair    Clinical Impairments  Affecting Rehab Potential  baseline neuropathy and R LE dysfunction     PT Frequency  2x / week    PT Duration  8 weeks    PT Treatment/Interventions  ADLs/Self Care Home Management;Cryotherapy;Electrical Stimulation;Iontophoresis 4mg /ml Dexamethasone;Ultrasound;Moist Heat;Gait training;Stair training;Therapeutic activities;Therapeutic exercise;Patient/family education;Manual  techniques;Passive range of motion;Dry needling;Taping    PT Next Visit Plan  manual therapy to ankle and calfto improve DF; consitder IASTYM; continue with AROM of the ankle; add strengthening to ankle when able; consider standing weight shifting; modalities PRN; consider sested rocker board; plantar facia stretch     PT Home Exercise Plan  ankle pumps; windshield wipers; ankle DF stretch; plantar flexion stretch    Consulted and Agree with Plan of Care  Patient       Patient will benefit from skilled therapeutic intervention in order to improve the following deficits and impairments:  Abnormal gait, Pain, Decreased activity tolerance, Decreased endurance, Decreased range of motion, Decreased strength, Difficulty walking  Visit Diagnosis: Pain in left ankle and joints of left foot  Stiffness of left ankle, not elsewhere classified  Other abnormalities of gait and mobility     Problem List Patient Active Problem List   Diagnosis Date Noted  . Pulmonary hypertension ([place]) 03/28/2017  . Morbid obesity due to excess calories ([place]) 09/29/2015  . OSA (obstructive sleep apnea) 09/23/2014  . Atherosclerosis of native arteries of extremity with intermittent claudication ([place]) 08/05/2014  . Chronic respiratory failure with hypoxia ([place]) 04/25/2014  . DOE (dyspnea on exertion) 04/01/2014  . Encounter for colonoscopy due to history of adenomatous colonic polyps 02/28/2012  . GERD (gastroesophageal reflux disease) 02/28/2012  . DM type 2 (diabetes mellitus, type 2) ([place])   . Hyperlipidemia   . Essential hypertension     Carney Living PT DPT  04/16/2018, 11:34 AM  [facility] [address] [place], [place], [address] Phone: [phone]   Fax:  [phone]  Name: Debbie [name] MRN: [id number] Date of Birth: 09-19-1945

## 2018-04-21 ENCOUNTER — Ambulatory Visit: Payer: Medicare Other | Admitting: Physical Therapy

## 2018-04-21 ENCOUNTER — Encounter: Payer: Self-pay | Admitting: Physical Therapy

## 2018-04-21 DIAGNOSIS — M25672 Stiffness of left ankle, not elsewhere classified: Secondary | ICD-10-CM

## 2018-04-21 DIAGNOSIS — M25572 Pain in left ankle and joints of left foot: Secondary | ICD-10-CM | POA: Diagnosis not present

## 2018-04-21 DIAGNOSIS — R2689 Other abnormalities of gait and mobility: Secondary | ICD-10-CM

## 2018-04-21 NOTE — Therapy (Signed)
[place], [place], [address] Phone: [phone]   Fax:  [phone]  Physical Therapy Treatment  Patient Details  Name: Debbie [name] MRN: [id number] Date of Birth: [birth year] Referring Provider: Mechele Claude PA-C    Encounter Date: 04/21/2018  PT End of Session - 04/21/18 1052    Visit Number  3    Number of Visits  16    Date for PT Re-Evaluation  05/26/18    PT Start Time  0848    PT Stop Time  0930    PT Time Calculation (min)  42 min    Activity Tolerance  Patient tolerated treatment well    Behavior During Therapy  [facility] for tasks assessed/performed       Past Medical History:  Diagnosis Date  . Adenomatous polyp 12/04/2006  . Asthma   . COPD (chronic obstructive pulmonary disease) ([place])   . DM type 2 (diabetes mellitus, type 2) ([place])   . Hemorrhoid 12/04/2006  . Hyperlipidemia   . Hypertension   . Peripheral arterial disease [facility])     Past Surgical History:  Procedure Laterality Date  . CHOLECYSTECTOMY    . COLONOSCOPY  12/03/2006   Dr. Delight Ovens, adenomatous polyp  . COLONOSCOPY  03/25/2012   Procedure: COLONOSCOPY;  Surgeon: Daneil Dolin, MD;  [facility];  Service: Endoscopy;  Laterality: N/A;  10:30  . ESOPHAGOGASTRODUODENOSCOPY  11/03/2002   Dr. Gala Romney- normal exam- was done to check for possible foreign body  . Fiberoptic bronchoscopy with endobronchial  ultrasound  10/22/2010   [place]  . Right BKA  1990  . RIGHT HEART CATHETERIZATION N/A 06/22/2014   Procedure: RIGHT HEART CATH;  Surgeon: Larey Dresser, MD;  Location: [facility] CATH LAB;  Service: Cardiovascular;  Laterality: N/A;    There were no vitals filed for this visit.  Subjective Assessment - 04/21/18 0859    Subjective  I exercises too much and my calf is sore ( heel lifts.)    Currently in Pain?  Yes    Pain Score  4     Pain Location  Foot    Pain Orientation  Left;Lateral    Pain Descriptors /  Indicators  -- pulls    Pain Radiating Towards  leg toward knee    Aggravating Factors   exercise,  standing walking    Pain Relieving Factors  rest  ice    Multiple Pain Sites  -- back pain with standing.  longstanding ,  not new.                        [facility] Adult PT Treatment/Exercise - 04/21/18 0001      Manual Therapy   Manual Therapy  Joint mobilization;Manual Traction;Soft tissue mobilization;Passive ROM    Manual therapy comments  A/P glides distal fibula helpful  mid fibula painful so did not do (  SHE said she had some Blockage there)    Joint Mobilization  AP glides mobs with movement  into DF    Soft tissue mobilization  trigger point release to right calf; plantar flacia light stretch and soft tissue mobilization    Passive ROM  left passive DF stretch       Ankle Exercises: Stretches   Other Stretch  rolling tennis ball under foot on airex 2 minutes,  prostretch 3 minutes       Ankle Exercises: Supine   Other Supine Ankle Exercises  manually  resisted PF/DF       Ankle Exercises: Seated   Heel Raises  10 reps on airex    Toe Raise  10 reps feet resting on airex pad    Other Seated Ankle Exercises  IV/EV  10 X    Other Seated Ankle Exercises  toe curls and uncurls 10 x             PT Education - 04/21/18 1052    Education Details  anatomy    Person(s) Educated  Patient    Methods  Demonstration;Explanation    Comprehension  Verbalized understanding       PT Short Term Goals - 03/31/18 1542      PT SHORT TERM GOAL #1   Title  Patient will increase left passive DF by 7 degrees     Time  4    Period  Weeks    Status  New    Target Date  04/28/18      PT SHORT TERM GOAL #2   Title  Patient will increase gross left ankle Ev and IV strength to 4+/5     Time  4    Period  Weeks    Status  New    Target Date  04/28/18      PT SHORT TERM GOAL #3   Title  Patient will be inependent with basic HEP     Time  4    Period  Weeks    Status   New    Target Date  04/28/18        PT Long Term Goals - 03/31/18 1621      PT LONG TERM GOAL #1   Title  Patient will stand for 10 minutes without slef report of pain in left ankle in order to perfrom ADL's     Time  8    Period  Weeks    Status  New    Target Date  05/27/18      PT LONG TERM GOAL #2   Title  Patient will demsotrate 5 degrees of active DF in order to improve gait technique and decrease amount the plantar facia stretches     Time  8    Period  Weeks    Status  New    Target Date  05/27/18      PT LONG TERM GOAL #3   Title  Patient will demsotrate a 52% limitation on FOTO     Time  8    Period  Weeks    Status  New    Target Date  05/27/18            Plan - 04/21/18 1052    Clinical Impression Statement  Patient has sore calf muscles from heel lifts.  She was reluctant to do anything standing today due to this and cramping in right thigh.  DF comfort improves with fibular glides posterior. Compression socks helpful with edema,  none noted.     PT Next Visit Plan  manual therapy to ankle and calfto improve DF; consitder IASTYM; continue with AROM of the ankle; add strengthening to ankle when able; consider standing weight shifting; modalities PRN; consider sested rocker board; plantar facia stretch     PT Home Exercise Plan  ankle pumps; windshield wipers; ankle DF stretch; plantar flexion stretch    Consulted and Agree with Plan of Care  Patient       Patient will benefit from skilled therapeutic intervention in order  to improve the following deficits and impairments:     Visit Diagnosis: Pain in left ankle and joints of left foot  Stiffness of left ankle, not elsewhere classified  Other abnormalities of gait and mobility     Problem List Patient Active Problem List   Diagnosis Date Noted  . Pulmonary hypertension ([place]) 03/28/2017  . Morbid obesity due to excess calories ([place]) 09/29/2015  . OSA (obstructive sleep apnea) 09/23/2014  .  Atherosclerosis of native arteries of extremity with intermittent claudication ([place]) 08/05/2014  . Chronic respiratory failure with hypoxia ([place]) 04/25/2014  . DOE (dyspnea on exertion) 04/01/2014  . Encounter for colonoscopy due to history of adenomatous colonic polyps 02/28/2012  . GERD (gastroesophageal reflux disease) 02/28/2012  . DM type 2 (diabetes mellitus, type 2) ([place])   . Hyperlipidemia   . Essential hypertension     Vernona Peake PTA 04/21/2018, 10:56 AM  [facility] [address] [place], [place], [address] Phone: [phone]   Fax:  [phone]  Name: LOWEN MANSOURI MRN: [id number] Date of Birth: [birth year]

## 2018-04-23 ENCOUNTER — Ambulatory Visit: Payer: Medicare Other | Admitting: Physical Therapy

## 2018-04-27 ENCOUNTER — Telehealth: Payer: Self-pay | Admitting: Internal Medicine

## 2018-04-27 NOTE — Telephone Encounter (Signed)
ONO on 3lpm done on 04/16/18 by [employer] reviewed by MW  Per MW- normal, continue 3lpm noct o2  Spoke with pt and notified of results per Dr. Melvyn Novas. Pt verbalized understanding and denied any questions.

## 2018-04-28 ENCOUNTER — Ambulatory Visit: Payer: Medicare Other | Admitting: Physical Therapy

## 2018-04-28 ENCOUNTER — Encounter: Payer: Self-pay | Admitting: Physical Therapy

## 2018-04-28 DIAGNOSIS — M25672 Stiffness of left ankle, not elsewhere classified: Secondary | ICD-10-CM

## 2018-04-28 DIAGNOSIS — R2689 Other abnormalities of gait and mobility: Secondary | ICD-10-CM

## 2018-04-28 DIAGNOSIS — M25572 Pain in left ankle and joints of left foot: Secondary | ICD-10-CM | POA: Diagnosis not present

## 2018-04-28 NOTE — Patient Instructions (Addendum)
Watch pressure sore,   Take shoes off and rest at home Might pad area to relieve pressure in shoe Call MD if it gets worse.

## 2018-04-28 NOTE — Therapy (Signed)
[place], [place], [address] Phone: [phone]   Fax:  [phone]  Physical Therapy Treatment  Patient Details  Name: Debbie [name] MRN: [id number] Date of Birth: 1946-01-26 Referring Provider: Mechele Claude PA-C    Encounter Date: 04/28/2018  PT End of Session - 04/28/18 0944    Visit Number  4    Number of Visits  16    Date for PT Re-Evaluation  05/26/18    PT Start Time  0848    PT Stop Time  0930    PT Time Calculation (min)  42 min    Activity Tolerance  Patient tolerated treatment well;No increased pain    Behavior During Therapy  [facility] for tasks assessed/performed       Past Medical History:  Diagnosis Date  . Adenomatous polyp 12/04/2006  . Asthma   . COPD (chronic obstructive pulmonary disease) ([place])   . DM type 2 (diabetes mellitus, type 2) ([place])   . Hemorrhoid 12/04/2006  . Hyperlipidemia   . Hypertension   . Peripheral arterial disease [facility])     Past Surgical History:  Procedure Laterality Date  . CHOLECYSTECTOMY    . COLONOSCOPY  12/03/2006   Dr. Delight Ovens, adenomatous polyp  . COLONOSCOPY  03/25/2012   Procedure: COLONOSCOPY;  Surgeon: Daneil Dolin, MD;  [facility];  Service: Endoscopy;  Laterality: N/A;  10:30  . ESOPHAGOGASTRODUODENOSCOPY  11/03/2002   Dr. Gala Romney- normal exam- was done to check for possible foreign body  . Fiberoptic bronchoscopy with endobronchial  ultrasound  10/22/2010   [place]  . Right BKA  1990  . RIGHT HEART CATHETERIZATION N/A 06/22/2014   Procedure: RIGHT HEART CATH;  Surgeon: Larey Dresser, MD;  Location: [facility] CATH LAB;  Service: Cardiovascular;  Laterality: N/A;    There were no vitals filed for this visit.  Subjective Assessment - 04/28/18 0856    Subjective  My little toe is sore.  I am getting more feeling in my foot .  I did a lot of walking this weekend.  I am getting more motion.    Currently in Pain?  No/denies    Pain Score  --   up to 4-5  more if cramps   Pain Orientation  Left;Lateral    Pain Descriptors / Indicators  Sore;Cramping    Pain Type  Chronic pain    Pain Radiating Towards  lateral knee cramps    Aggravating Factors   worse when I am off my feet    Pain Relieving Factors  compression sock helps    Effect of Pain on Daily Activities  walking down hill ,  sleep interrupted                       [facility] Adult PT Treatment/Exercise - 04/28/18 0001      Self-Care   Self-Care  Other Self-Care Comments    Other Self-Care Comments   keep watching pressure sore on little toe.      Manual Therapy   Passive ROM  left passive DF stretch       Ankle Exercises: Stretches   Other Stretch  rocker board stretch 1-2 minutes  PF/DF      Ankle Exercises: Supine   Other Supine Ankle Exercises  toe curls.     Other Supine Ankle Exercises  manually resisted ROM  10 x each,  with DF concentric 5 x and eccentric 5 X  all others concentric 10 x      Ankle Exercises: Seated   ABC's  --   Print ian writing   Towel Crunch  --   10 X curling and 10 X opening  soreness  so stopped  ball   [place] Pickup  10 uses last 3 toes,  unable with great toe   last 3 toes the most sore with uncurling.   Heel Raises Limitations  10    Heel Slides  10 reps   foot on pillow case            PT Education - 04/28/18 0944    Education Details  self care    Person(s) Educated  Patient    Methods  Explanation    Comprehension  Verbalized understanding       PT Short Term Goals - 03/31/18 1542      PT SHORT TERM GOAL #1   Title  Patient will increase left passive DF by 7 degrees     Time  4    Period  Weeks    Status  New    Target Date  04/28/18      PT SHORT TERM GOAL #2   Title  Patient will increase gross left ankle Ev and IV strength to 4+/5     Time  4    Period  Weeks    Status  New    Target Date  04/28/18      PT SHORT TERM GOAL #3   Title  Patient will be inependent  with basic HEP     Time  4    Period  Weeks    Status  New    Target Date  04/28/18        PT Long Term Goals - 03/31/18 1621      PT LONG TERM GOAL #1   Title  Patient will stand for 10 minutes without slef report of pain in left ankle in order to perfrom ADL's     Time  8    Period  Weeks    Status  New    Target Date  05/27/18      PT LONG TERM GOAL #2   Title  Patient will demsotrate 5 degrees of active DF in order to improve gait technique and decrease amount the plantar facia stretches     Time  8    Period  Weeks    Status  New    Target Date  05/27/18      PT LONG TERM GOAL #3   Title  Patient will demsotrate a 52% limitation on FOTO     Time  8    Period  Weeks    Status  New    Target Date  05/27/18            Plan - 04/28/18 0954    Clinical Impression Statement  Patient has new small pressure sore lateral 5th toe. with pain lateral distal foot.  She will watch.  DF improved 10  degrees  Progress toward ROM goal.     PT Next Visit Plan    watch sore on toe,  ; add strengthening to ankle when able; consider standing weight shifting; modalities PRN; consider sested rocker board; plantar facia stretch     PT Home Exercise Plan  ankle pumps; windshield wipers; ankle DF stretch; plantar flexion stretch    Consulted and Agree with Plan of Care  Patient  Patient will benefit from skilled therapeutic intervention in order to improve the following deficits and impairments:     Visit Diagnosis: Pain in left ankle and joints of left foot  Stiffness of left ankle, not elsewhere classified  Other abnormalities of gait and mobility     Problem List Patient Active Problem List   Diagnosis Date Noted  . Pulmonary hypertension ([place]) 03/28/2017  . Morbid obesity due to excess calories ([place]) 09/29/2015  . OSA (obstructive sleep apnea) 09/23/2014  . Atherosclerosis of native arteries of extremity with intermittent claudication ([place]) 08/05/2014  . Chronic  respiratory failure with hypoxia ([place]) 04/25/2014  . DOE (dyspnea on exertion) 04/01/2014  . Encounter for colonoscopy due to history of adenomatous colonic polyps 02/28/2012  . GERD (gastroesophageal reflux disease) 02/28/2012  . DM type 2 (diabetes mellitus, type 2) ([place])   . Hyperlipidemia   . Essential hypertension     HARRIS,KAREN PTA 04/28/2018, 5:26 PM  [facility] [address] [place], [place], [address] Phone: [phone]   Fax:  [phone]  Name: NIANI MOURER MRN: [id number] Date of Birth: Feb 08, 1946

## 2018-04-30 ENCOUNTER — Encounter: Payer: Self-pay | Admitting: Physical Therapy

## 2018-04-30 ENCOUNTER — Ambulatory Visit: Payer: Medicare Other | Admitting: Physical Therapy

## 2018-04-30 DIAGNOSIS — M25672 Stiffness of left ankle, not elsewhere classified: Secondary | ICD-10-CM

## 2018-04-30 DIAGNOSIS — M25572 Pain in left ankle and joints of left foot: Secondary | ICD-10-CM | POA: Diagnosis not present

## 2018-04-30 DIAGNOSIS — R2689 Other abnormalities of gait and mobility: Secondary | ICD-10-CM

## 2018-04-30 NOTE — Therapy (Signed)
[place], [place], [address] Phone: [phone]   Fax:  [phone]  Physical Therapy Treatment  Patient Details  Name: Debbie [name] MRN: [id number] Date of Birth: 01/23/1946 Referring Provider: Mechele Claude PA-C    Encounter Date: 04/30/2018  PT End of Session - 04/30/18 0905    Visit Number  5    Number of Visits  16    Date for PT Re-Evaluation  05/26/18    Authorization Type  Medicare/ [employer]     PT Start Time  0845    PT Stop Time  0926    PT Time Calculation (min)  41 min    Activity Tolerance  Patient tolerated treatment well;No increased pain    Behavior During Therapy  WFL for tasks assessed/performed       Past Medical History:  Diagnosis Date  . Adenomatous polyp 12/04/2006  . Asthma   . COPD (chronic obstructive pulmonary disease) ([place])   . DM type 2 (diabetes mellitus, type 2) ([place])   . Hemorrhoid 12/04/2006  . Hyperlipidemia   . Hypertension   . Peripheral arterial disease [facility])     Past Surgical History:  Procedure Laterality Date  . CHOLECYSTECTOMY    . COLONOSCOPY  12/03/2006   Dr. Delight Ovens, adenomatous polyp  . COLONOSCOPY  03/25/2012   Procedure: COLONOSCOPY;  Surgeon: Daneil Dolin, MD;  [facility];  Service: Endoscopy;  Laterality: N/A;  10:30  . ESOPHAGOGASTRODUODENOSCOPY  11/03/2002   Dr. Gala Romney- normal exam- was done to check for possible foreign body  . Fiberoptic bronchoscopy with endobronchial  ultrasound  10/22/2010   [place]  . Right BKA  1990  . RIGHT HEART CATHETERIZATION N/A 06/22/2014   Procedure: RIGHT HEART CATH;  Surgeon: Larey Dresser, MD;  Location: [facility] CATH LAB;  Service: Cardiovascular;  Laterality: N/A;    There were no vitals filed for this visit.  Subjective Assessment - 04/30/18 1656    Subjective  Patient reports that her cramping has been better. Her pain in the bottom of her foot has improved. She is still having some  lateral foot pain. She alos feels like she can not put her big toe down.     Limitations  Standing;Walking    How long can you stand comfortably?  Just a few minutes     How long can you walk comfortably?  Has to ride a scooter to go shopping; can walk to the kitchen and the bathroom    Currently in Pain?  No/denies   No pain unless weightbearing    Pain Orientation  Left;Lateral    Pain Descriptors / Indicators  Aching    Pain Type  Chronic pain    Pain Radiating Towards  lateral knee cramps     Pain Onset  More than a month ago    Pain Frequency  Rarely    Aggravating Factors   worse when i am off my feet     Pain Relieving Factors  comporession sock helps     Effect of Pain on Daily Activities  walking down hill                        [facility] Adult PT Treatment/Exercise - 04/30/18 0001      Manual Therapy   Manual Therapy  Joint mobilization;Manual Traction;Soft tissue mobilization;Passive ROM    Manual therapy comments  A/P glides distal fibula helpful  mid fibula painful  so did not do (  SHE said she had some Blockage there)    Joint Mobilization  AP glides; great toe mobilization; small toe mobilization    mobs with movement  into DF   Soft tissue mobilization  trigger point release to right calf; plantar flacia light stretch and soft tissue mobilization    Passive ROM  left passive DF stretch       Ankle Exercises: Stretches   Other Stretch  rocker board stretch 1-2 minutes  PF/DF      Ankle Exercises: Supine   T-Band  ankle PF/ DF yellow 2x10       Ankle Exercises: Seated   Heel Raises  10 reps   on airex   Toe Raise  10 reps   feet resting on airex pad   Heel Slides Limitations  rocker board x10     Other Seated Ankle Exercises  IV/EV  10 X    Other Seated Ankle Exercises  toe curls and uncurls 10 x             PT Education - 04/30/18 1658    Education Details  reviewed self stretching     Person(s) Educated  Patient    Methods   Explanation;Tactile cues;Demonstration;Verbal cues    Comprehension  Verbalized understanding;Returned demonstration;Verbal cues required;Tactile cues required       PT Short Term Goals - 04/30/18 1702      PT SHORT TERM GOAL #1   Title  Patient will increase left passive DF by 7 degrees     Time  4    Period  Weeks    Status  On-going      PT SHORT TERM GOAL #2   Title  Patient will increase gross left ankle Ev and IV strength to 4+/5     Time  4    Period  Weeks    Status  On-going      PT SHORT TERM GOAL #3   Title  Patient will be inependent with basic HEP     Time  4    Period  Weeks    Status  On-going        PT Long Term Goals - 03/31/18 1621      PT LONG TERM GOAL #1   Title  Patient will stand for 10 minutes without slef report of pain in left ankle in order to perfrom ADL's     Time  8    Period  Weeks    Status  New    Target Date  05/27/18      PT LONG TERM GOAL #2   Title  Patient will demsotrate 5 degrees of active DF in order to improve gait technique and decrease amount the plantar facia stretches     Time  8    Period  Weeks    Status  New    Target Date  05/27/18      PT LONG TERM GOAL #3   Title  Patient will demsotrate a 52% limitation on FOTO     Time  8    Period  Weeks    Status  New    Target Date  05/27/18            Plan - 04/30/18 1701    Clinical Impression Statement  Patient is making progress with ROM and pain. She still feels like she is walking on the edge of her foot. She has a small pressure ulcer  on her left great toe which is being monitored. She was encouraged to continue with her exercises at home.     Clinical Presentation  Evolving    Clinical Decision Making  Moderate    PT Frequency  2x / week    PT Duration  8 weeks    PT Treatment/Interventions  ADLs/Self Care Home Management;Cryotherapy;Electrical Stimulation;Iontophoresis 4mg /ml Dexamethasone;Ultrasound;Moist Heat;Gait training;Stair training;Therapeutic  activities;Therapeutic exercise;Patient/family education;Manual techniques;Passive range of motion;Dry needling;Taping    PT Next Visit Plan    watch sore on toe,  ; add strengthening to ankle when able; consider standing weight shifting; modalities PRN; consider sested rocker board; plantar facia stretch     PT Home Exercise Plan  ankle pumps; windshield wipers; ankle DF stretch; plantar flexion stretch    Consulted and Agree with Plan of Care  Patient       Patient will benefit from skilled therapeutic intervention in order to improve the following deficits and impairments:  Abnormal gait, Pain, Decreased activity tolerance, Decreased endurance, Decreased range of motion, Decreased strength, Difficulty walking  Visit Diagnosis: Pain in left ankle and joints of left foot  Stiffness of left ankle, not elsewhere classified  Other abnormalities of gait and mobility     Problem List Patient Active Problem List   Diagnosis Date Noted  . Pulmonary hypertension ([place]) 03/28/2017  . Morbid obesity due to excess calories ([place]) 09/29/2015  . OSA (obstructive sleep apnea) 09/23/2014  . Atherosclerosis of native arteries of extremity with intermittent claudication ([place]) 08/05/2014  . Chronic respiratory failure with hypoxia ([place]) 04/25/2014  . DOE (dyspnea on exertion) 04/01/2014  . Encounter for colonoscopy due to history of adenomatous colonic polyps 02/28/2012  . GERD (gastroesophageal reflux disease) 02/28/2012  . DM type 2 (diabetes mellitus, type 2) ([place])   . Hyperlipidemia   . Essential hypertension     Carney Living PT DPT  04/30/2018, 5:03 PM  [facility] [address] [place], [place], [address] Phone: [phone]   Fax:  [phone]  Name: Debbie [name] MRN: [id number] Date of Birth: 1946/01/19

## 2018-05-15 ENCOUNTER — Encounter

## 2018-05-19 ENCOUNTER — Encounter: Payer: Medicare Other | Admitting: Physical Therapy

## 2018-05-21 ENCOUNTER — Encounter: Payer: Self-pay | Admitting: Physical Therapy

## 2018-05-21 ENCOUNTER — Ambulatory Visit: Payer: Medicare Other | Admitting: Physical Therapy

## 2018-05-21 ENCOUNTER — Ambulatory Visit: Payer: Medicare Other | Attending: Orthopedic Surgery | Admitting: Physical Therapy

## 2018-05-21 DIAGNOSIS — M25572 Pain in left ankle and joints of left foot: Secondary | ICD-10-CM | POA: Diagnosis present

## 2018-05-21 DIAGNOSIS — R2689 Other abnormalities of gait and mobility: Secondary | ICD-10-CM | POA: Insufficient documentation

## 2018-05-21 DIAGNOSIS — M25672 Stiffness of left ankle, not elsewhere classified: Secondary | ICD-10-CM | POA: Diagnosis present

## 2018-05-21 NOTE — Therapy (Signed)
[place], [place], [address] Phone: [phone]   Fax:  [phone]  Physical Therapy Treatment  Patient Details  Name: Debbie [name] MRN: [id number] Date of Birth: [birth year] Referring Provider: Mechele Claude PA-C    Encounter Date: 05/21/2018  PT End of Session - 05/21/18 0808    Visit Number  6    Number of Visits  16    Date for PT Re-Evaluation  05/26/18    Authorization Type  Medicare/ Medici[employer]     PT Start Time  0800    PT Stop Time  0840    PT Time Calculation (min)  40 min       Past Medical History:  Diagnosis Date  . Adenomatous polyp 12/04/2006  . Asthma   . COPD (chronic obstructive pulmonary disease) ([place])   . DM type 2 (diabetes mellitus, type 2) ([place])   . Hemorrhoid 12/04/2006  . Hyperlipidemia   . Hypertension   . Peripheral arterial disease [facility])     Past Surgical History:  Procedure Laterality Date  . CHOLECYSTECTOMY    . COLONOSCOPY  12/03/2006   Dr. Delight Ovens, adenomatous polyp  . COLONOSCOPY  03/25/2012   Procedure: COLONOSCOPY;  Surgeon: Daneil Dolin, MD;  [facility];  Service: Endoscopy;  Laterality: N/A;  10:30  . ESOPHAGOGASTRODUODENOSCOPY  11/03/2002   Dr. Gala Romney- normal exam- was done to check for possible foreign body  . Fiberoptic bronchoscopy with endobronchial  ultrasound  10/22/2010   [place]  . Right BKA  1990  . RIGHT HEART CATHETERIZATION N/A 06/22/2014   Procedure: RIGHT HEART CATH;  Surgeon: Larey Dresser, MD;  Location: [facility] CATH LAB;  Service: Cardiovascular;  Laterality: N/A;    There were no vitals filed for this visit.  Subjective Assessment - 05/21/18 0804    Subjective  Just soreness, a case of protein shakes rolled over on my foot and bruised the inside of ankle. Saw Orthopedic MD who was pleased with my progress.     Currently in Pain?  No/denies    Aggravating Factors   prolonged standing/walking, needs cart/WC    Pain  Relieving Factors  compression socks          [facility] PT Assessment - 05/21/18 0001      AROM   Left Ankle Dorsiflexion  0      PROM   Left Ankle Dorsiflexion  2      Strength   Left Ankle Inversion  4+/5    Left Ankle Eversion  4+/5                   [facility] Adult PT Treatment/Exercise - 05/21/18 0001      Manual Therapy   Manual therapy comments  A/P glides distal fibula     Joint Mobilization  AP glides; great toe mobilization; small toe mobilization    mobs with movement  into DF   Passive ROM  left passive DF stretch       Ankle Exercises: Seated   Heel Slides Limitations  rocker board x10     Other Seated Ankle Exercises  Toe Yoga, marble pick up      Other Seated Ankle Exercises  toe curls and uncurls 10 x      Ankle Exercises: Supine   T-Band  ankle PF/ DF yellow 2x10       Ankle Exercises: Stretches   Other Stretch  rocker board stretch 1-2 minutes  PF/DF  PT Short Term Goals - 05/21/18 0844      PT SHORT TERM GOAL #1   Title  Patient will increase left passive DF by 7 degrees     Baseline  2 degrees passive    Time  4    Period  Weeks    Status  On-going      PT SHORT TERM GOAL #2   Title  Patient will increase gross left ankle Ev and IV strength to 4+/5     Time  4    Period  Weeks    Status  Achieved      PT SHORT TERM GOAL #3   Title  Patient will be inependent with basic HEP     Time  4    Period  Weeks    Status  Achieved        PT Long Term Goals - 03/31/18 1621      PT LONG TERM GOAL #1   Title  Patient will stand for 10 minutes without slef report of pain in left ankle in order to perfrom ADL's     Time  8    Period  Weeks    Status  New    Target Date  05/27/18      PT LONG TERM GOAL #2   Title  Patient will demsotrate 5 degrees of active DF in order to improve gait technique and decrease amount the plantar facia stretches     Time  8    Period  Weeks    Status  New    Target Date  05/27/18       PT LONG TERM GOAL #3   Title  Patient will demsotrate a 52% limitation on FOTO     Time  8    Period  Weeks    Status  New    Target Date  05/27/18            Plan - 05/21/18 0845    Clinical Impression Statement  PROM and AROM of ankle improved. Ankle inversion and eversion strength improved to 4+/5. She is indendent with HEP thus far. STG# 2, 3 met. She is limited in standing/walking more than from parking lot to inside however she attributes this limitation more to her back pain rather than her ankle.     PT Next Visit Plan  probable discharge next vist as pt feels she is at baseline. FOTO    PT Home Exercise Plan  ankle pumps; windshield wipers; ankle DF stretch; plantar flexion stretch    Consulted and Agree with Plan of Care  Patient       Patient will benefit from skilled therapeutic intervention in order to improve the following deficits and impairments:  Abnormal gait, Pain, Decreased activity tolerance, Decreased endurance, Decreased range of motion, Decreased strength, Difficulty walking  Visit Diagnosis: Pain in left ankle and joints of left foot  Stiffness of left ankle, not elsewhere classified  Other abnormalities of gait and mobility     Problem List Patient Active Problem List   Diagnosis Date Noted  . Pulmonary hypertension ([place]) 03/28/2017  . Morbid obesity due to excess calories ([place]) 09/29/2015  . OSA (obstructive sleep apnea) 09/23/2014  . Atherosclerosis of native arteries of extremity with intermittent claudication ([place]) 08/05/2014  . Chronic respiratory failure with hypoxia ([place]) 04/25/2014  . DOE (dyspnea on exertion) 04/01/2014  . Encounter for colonoscopy due to history of adenomatous colonic polyps 02/28/2012  . GERD (gastroesophageal reflux  disease) 02/28/2012  . DM type 2 (diabetes mellitus, type 2) ([place])   . Hyperlipidemia   . Essential hypertension     Debbie [name], [place] 05/21/2018, 8:50 AM  [facility] [address] [place], [place], [address] Phone: [phone]   Fax:  [phone]  Name: Debbie [name] MRN: [id number] Date of Birth: [birth year]

## 2018-05-26 ENCOUNTER — Ambulatory Visit: Payer: Medicare Other | Admitting: Physical Therapy

## 2018-05-26 ENCOUNTER — Encounter: Payer: Self-pay | Admitting: Physical Therapy

## 2018-05-26 DIAGNOSIS — M25572 Pain in left ankle and joints of left foot: Secondary | ICD-10-CM

## 2018-05-26 DIAGNOSIS — R2689 Other abnormalities of gait and mobility: Secondary | ICD-10-CM

## 2018-05-26 DIAGNOSIS — M25672 Stiffness of left ankle, not elsewhere classified: Secondary | ICD-10-CM

## 2018-05-26 NOTE — Therapy (Addendum)
[place], [place], [address] Phone: [phone]   Fax:  [phone]  Physical Therapy Treatment/ Discharge   Patient Details  Name: BITANIA SHANKLAND MRN: [id number] Date of Birth: Dec 09, 1945 Referring Provider: Mechele Claude PA-C    Encounter Date: 05/26/2018  PT End of Session - 05/26/18 0902    Visit Number  6    Number of Visits  16    Date for PT Re-Evaluation  05/26/18    Authorization Type  Medicare/ Mediciad     PT Start Time  0845    PT Stop Time  0926    PT Time Calculation (min)  41 min    Activity Tolerance  Patient tolerated treatment well    Behavior During Therapy  [facility] for tasks assessed/performed       Past Medical History:  Diagnosis Date  . Adenomatous polyp 12/04/2006  . Asthma   . COPD (chronic obstructive pulmonary disease) ([place])   . DM type 2 (diabetes mellitus, type 2) ([place])   . Hemorrhoid 12/04/2006  . Hyperlipidemia   . Hypertension   . Peripheral arterial disease [facility])     Past Surgical History:  Procedure Laterality Date  . CHOLECYSTECTOMY    . COLONOSCOPY  12/03/2006   Dr. Delight Ovens, adenomatous polyp  . COLONOSCOPY  03/25/2012   Procedure: COLONOSCOPY;  Surgeon: Daneil Dolin, MD;  [facility];  Service: Endoscopy;  Laterality: N/A;  10:30  . ESOPHAGOGASTRODUODENOSCOPY  11/03/2002   Dr. Gala Romney- normal exam- was done to check for possible foreign body  . Fiberoptic bronchoscopy with endobronchial  ultrasound  10/22/2010   [place]  . Right BKA  1990  . RIGHT HEART CATHETERIZATION N/A 06/22/2014   Procedure: RIGHT HEART CATH;  Surgeon: Larey Dresser, MD;  Location: [facility] CATH LAB;  Service: Cardiovascular;  Laterality: N/A;    There were no vitals filed for this visit.  Subjective Assessment - 05/26/18 0851    Subjective  Patient reports her foot has been feeling pretty good. It is still a little sore from when the protien drinks fell on it but it is  better.     Limitations  Standing;Walking    Currently in Pain?  No/denies                               PT Education - 05/26/18 1527    Education Details  reviewed final HEP     Person(s) Educated  Patient    Methods  Explanation;Demonstration;Tactile cues;Verbal cues    Comprehension  Verbalized understanding;Returned demonstration;Tactile cues required;Verbal cues required;Need further instruction       PT Short Term Goals - 05/26/18 1530      PT SHORT TERM GOAL #1   Title  Patient will increase left passive DF by 7 degrees     Baseline  7 degrees passive     Time  4    Period  Weeks    Status  Achieved      PT SHORT TERM GOAL #2   Title  Patient will increase gross left ankle Ev and IV strength to 4+/5     Time  4    Period  Weeks    Status  Achieved      PT SHORT TERM GOAL #3   Title  Patient will be inependent with basic HEP     Baseline  full program  Time  4    Period  Weeks    Status  Achieved        PT Long Term Goals - 05/26/18 1531      PT LONG TERM GOAL #1   Title  Patient will stand for 10 minutes without slef report of pain in left ankle in order to perfrom ADL's     Baseline  able to stand for balut 10 minutes before her stump began to hurt     Time  8    Period  Weeks    Status  Achieved      PT LONG TERM GOAL #2   Title  Patient will demsotrate 5 degrees of active DF in order to improve gait technique and decrease amount the plantar facia stretches     Time  8    Period  Weeks    Status  Achieved      PT LONG TERM GOAL #3   Title  Patient will demsotrate a 52% limitation on FOTO     Baseline  41% limitation     Time  8    Period  Weeks    Status  Achieved            Plan - 05/26/18 0903    Clinical Impression Statement  Patient has made good progress. She feels like her ankle is no longer giving her problems with standing and walking. she is more limited by her porthetic. She has reached all goals for  therapy. She had a significant improvement in ankle motion. She is happy with her progress and ready for discharge.     Clinical Presentation  Evolving    Clinical Decision Making  Moderate    Rehab Potential  Fair    Clinical Impairments Affecting Rehab Potential  baseline neuropathy and R LE dysfunction     PT Frequency  2x / week    PT Duration  8 weeks    PT Treatment/Interventions  ADLs/Self Care Home Management;Cryotherapy;Electrical Stimulation;Iontophoresis 61m/ml Dexamethasone;Ultrasound;Moist Heat;Gait training;Stair training;Therapeutic activities;Therapeutic exercise;Patient/family education;Manual techniques;Passive range of motion;Dry needling;Taping    PT Next Visit Plan  probable discharge next vist as pt feels she is at baseline. FOTO    PT Home Exercise Plan  ankle pumps; windshield wipers; ankle DF stretch; plantar flexion stretch    Consulted and Agree with Plan of Care  Patient       Patient will benefit from skilled therapeutic intervention in order to improve the following deficits and impairments:  Abnormal gait, Pain, Decreased activity tolerance, Decreased endurance, Decreased range of motion, Decreased strength, Difficulty walking  Visit Diagnosis: Pain in left ankle and joints of left foot  Stiffness of left ankle, not elsewhere classified  Other abnormalities of gait and mobility   PHYSICAL THERAPY DISCHARGE SUMMARY  Visits from Start of Care: 6  Current functional level related to goals / functional outcomes: Improved ankle pain and motion  Remaining deficits: Mostly limited by prosthetic    Education / Equipment: HEP  Plan: Patient agrees to discharge.  Patient goals were met. Patient is being discharged due to meeting the stated rehab goals.  ?????       Problem List Patient Active Problem List   Diagnosis Date Noted  . Pulmonary hypertension ([place] 03/28/2017  . Morbid obesity due to excess calories ([place] 09/29/2015  . OSA (obstructive  sleep apnea) 09/23/2014  . Atherosclerosis of native arteries of extremity with intermittent claudication ([place] 08/05/2014  . Chronic respiratory failure with  hypoxia ([place]) 04/25/2014  . DOE (dyspnea on exertion) 04/01/2014  . Encounter for colonoscopy due to history of adenomatous colonic polyps 02/28/2012  . GERD (gastroesophageal reflux disease) 02/28/2012  . DM type 2 (diabetes mellitus, type 2) ([place])   . Hyperlipidemia   . Essential hypertension     Carney Living PT DPT  05/26/2018, 3:32 PM  [facility] [address] [place], [place], [address] Phone: [phone]   Fax:  [phone]  Name: MASIE BERMINGHAM MRN: [id number] Date of Birth: [birth year]

## 2018-05-28 ENCOUNTER — Ambulatory Visit: Payer: Medicare Other | Admitting: Physical Therapy

## 2018-06-02 ENCOUNTER — Ambulatory Visit: Payer: Medicare Other | Admitting: Physical Therapy

## 2018-06-04 ENCOUNTER — Encounter: Payer: Medicare Other | Admitting: Physical Therapy

## 2018-06-25 ENCOUNTER — Encounter: Payer: Self-pay | Admitting: Internal Medicine

## 2018-06-25 ENCOUNTER — Ambulatory Visit (INDEPENDENT_AMBULATORY_CARE_PROVIDER_SITE_OTHER): Payer: Medicare Other | Admitting: Internal Medicine

## 2018-06-25 VITALS — BP 172/90 | HR 77 | Ht 64.56 in | Wt 204.0 lb

## 2018-06-25 DIAGNOSIS — G4733 Obstructive sleep apnea (adult) (pediatric): Secondary | ICD-10-CM

## 2018-06-25 DIAGNOSIS — I10 Essential (primary) hypertension: Secondary | ICD-10-CM | POA: Diagnosis not present

## 2018-06-25 DIAGNOSIS — J9611 Chronic respiratory failure with hypoxia: Secondary | ICD-10-CM

## 2018-06-25 LAB — PULMONARY FUNCTION TEST
DL/VA % pred: 55 %
DL/VA: 2.73 ml/min/mmHg/L
DLCO UNC % PRED: 46 %
DLCO UNC: 11.54 ml/min/mmHg
FEF 25-75 Post: 1.92 L/sec
FEF 25-75 Pre: 1.41 L/sec
FEF2575-%Change-Post: 36 %
FEF2575-%PRED-POST: 106 %
FEF2575-%Pred-Pre: 77 %
FEV1-%Change-Post: 7 %
FEV1-%Pred-Post: 95 %
FEV1-%Pred-Pre: 88 %
FEV1-POST: 2.13 L
FEV1-Pre: 1.98 L
FEV1FVC-%Change-Post: 0 %
FEV1FVC-%Pred-Pre: 98 %
FEV6-%CHANGE-POST: 7 %
FEV6-%PRED-POST: 101 %
FEV6-%Pred-Pre: 94 %
FEV6-PRE: 2.67 L
FEV6-Post: 2.88 L
FEV6FVC-%CHANGE-POST: 0 %
FEV6FVC-%PRED-PRE: 104 %
FEV6FVC-%Pred-Post: 105 %
FVC-%Change-Post: 7 %
FVC-%Pred-Post: 97 %
FVC-%Pred-Pre: 90 %
FVC-Post: 2.89 L
FVC-Pre: 2.69 L
POST FEV1/FVC RATIO: 74 %
POST FEV6/FVC RATIO: 100 %
Pre FEV1/FVC ratio: 74 %
Pre FEV6/FVC Ratio: 99 %
RV % PRED: 123 %
RV: 2.81 L
TLC % pred: 120 %
TLC: 6.21 L

## 2018-06-25 NOTE — Progress Notes (Signed)
Subjective:     Patient ID: Debbie [name], female   DOB: 07-Jul-1946     MRN: [id number]    Brief patient profile:  64   yowf quit smoking 1990 with ? pna in 2011/2012  [place] all acute changes resolved p rx for pna but ever since has required 02 at hs/ and with activity and followed previously by Dr [name] for chronic resp failure but no airflow obst on spirometry    History of Present Illness  09/29/2015 1st  office visit/ Debbie [name]  Transition of care  Chief Complaint  Patient presents with  . Follow-up    Former Dr [name] pt. Breathing is unchanged. She does notice it gets worse with colder weather.   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 Follow-up    1 year follow up, chronic respiratory failure, still SOB with exertion, hard to do everyday activities, on 3L of O2. recent illness, Rx'd Zpak and mucinex, otherwise breathing ok   MMRC3 = can't walk 100 yards even at a slow pace at a flat grade s stopping due to sob  Even on 02 Usually sleeping well  Just finished zpak and was taking  symbicort prn but not now and only feels she needs it with URI's  rec Please see patient coordinator before you leave today  to schedule evaluation for POC    03/24/2017  f/u ov/Debbie [name] re: MO / 0 2 dep sp remote pna  Chief Complaint  Patient presents with  . Follow-up    Pt states she is here for o2 recert for POC. She c/o occ dry cough if she talks for an extended length of time.   Doe still = MMRC3 even on 02 which she titrates up to 4lpm with adequate  sats  rec Please schedule a follow up visit in 12  months but call sooner if needed     04/04/17 Compared to a prior study in 2015,   there is now moderate LVH and the LVEF is higher at 65-70%. No   obvious PFO noted by saline microbubble contrast. There is   moderate TR with an RVSP of 56 mmHg and a normal, collapsing IVC.   Consistent with moderate pulmonary hypertension.    03/24/2018  f/u ov/Debbie [name] re:  Chronic resp failure ? Cor pulmonale related to obesity s hypoventilation  Chief Complaint  Patient presents with  . Follow-up     on 3L with exertion and at night, increased bp, stopped losartan, never recieved her portable tank, increased niffedical makes her legs burn when it was increases, face feels hot, stayed on 30 mg   Dyspnea:  3lpm dollar general only = MMRC3 = can't walk 100 yards even at a slow pace at a flat grade s stopping due to sob   Cough: sensation of drainage/ f/u by ENT Gso rec gi eval Rourke Sleeping: on either side/ feels nauseated all her life if lies on  back   02: 3lpm 24/7  rec Wear 3lpm 24/7 > we will refer for POC  We sill schedule overnight 02 sats on 3 lpm to make sure that's adequate       06/25/2018  f/u ov/Debbie [name] re: chronic  resp failure s/p ALI from ? Pna 2012 > chronic 02 dep/ obesity/ Chief Complaint  Patient presents with  . Follow-up    PFT done today. Her breathing is unchangd since the last visit.   Dyspnea:  50 ft on 3lpm  Cough daytime only :throat tickle >  Referred to GI by ent but hasn't gone yet / on dexilant already  Sleeping: bed flat/ one pillow/ hurts in back and gets nauseated - fine on sides  SABA use: none  02: 3lpm at hs and with activity     No obvious day to day or daytime variability or assoc excess/ purulent sputum or mucus plugs or hemoptysis or cp or chest tightness, subjective wheeze or overt sinus or hb symptoms.   Sleeps as above  without nocturnal  or early am exacerbation  of respiratory  c/o's or need for noct  saba. Also denies any obvious fluctuation of symptoms with weather or environmental changes or other aggravating or alleviating factors except as outlined above   No unusual exposure hx or h/o childhood pna/ asthma or knowledge of premature birth.  Current Allergies, Complete Past Medical History, Past Surgical History, Family History, and Social History were reviewed in [employer] record.  ROS  The following are not active complaints unless bolded Hoarseness, sore throat, dysphagia, dental problems, itching, sneezing,  nasal congestion or discharge of excess mucus or purulent secretions, ear ache,   fever, chills, sweats, unintended wt loss or wt gain, classically pleuritic or exertional cp,  orthopnea pnd or arm/hand swelling  or leg swelling, presyncope, palpitations, abdominal pain, anorexia, nausea, vomiting, diarrhea  or change in bowel habits or change in bladder habits, change in stools or change in urine, dysuria, hematuria,  rash, arthralgias, visual complaints, headache, numbness, weakness or ataxia or problems with walking due to prosthesis  or coordination,  change in mood or  memory.        Current Meds  Medication Sig  . aspirin EC 81 MG tablet Take 81 mg by mouth daily.    . BD PEN NEEDLE NANO U/F 32G X 4 MM MISC 2 (two) times daily. as directed  . Calcium Carb-Cholecalciferol (CALCIUM 1000 + D PO) Take 1,000 mg by mouth daily.  . cetirizine (ZYRTEC) 10 MG tablet Take 10 mg by mouth daily.  Marland Kitchen co-enzyme Q-10 30 MG capsule Take 1 capsule by mouth daily.  Marland Kitchen dexlansoprazole (DEXILANT) 60 MG capsule Take 1 capsule (60 mg total) by mouth daily.  . fluticasone (FLONASE) 50 MCG/ACT nasal spray Place 1 spray into the nose daily.  Marland Kitchen gabapentin (NEURONTIN) 100 MG capsule Take 100 mg by mouth 3 (three) times daily.  . halobetasol (ULTRAVATE) 0.05 % cream   . ibuprofen (ADVIL,MOTRIN) 200 MG tablet Take 600 mg by mouth every 6 (six) hours as needed. For pain  . Insulin  Pen Needle (BD PEN NEEDLE NANO U/F) 32G X 4 MM MISC USE TWICE DAILY AS DIRECTED  . metoprolol succinate (TOPROL-XL) 50 MG 24 hr tablet metoprolol succinate ER 50 mg tablet,extended release 24 hr  . NIFEdipine (PROCARDIA XL/ADALAT-CC) 30 MG 24 hr tablet Take 30 mg by mouth 2 (two) times daily.   . OXYGEN 3 lpm with sleep and  exertion  APS  . Probiotic Product (PROBIOTIC ADVANCED PO) Take by mouth daily.  . SitaGLIPtin-MetFORMIN HCl (JANUMET XR) 50-1000 MG TB24 Take 1 tablet by mouth 2 (two) times daily.   . VOLTAREN 1 % GEL APPLY 2 GM TO THE AFFECTED JOINT 4 TIMES DAILY AS NEEDED.                             Objective:   Physical Exam    amb obese wf     06/25/2018    204  03/24/2018        201 03/24/2017         211  09/30/2016       216   09/29/15 221 lb 6.4 oz (100.426 kg)  08/16/15 214 lb (97.07 kg)  05/17/15 216 lb 8 oz (98.204 kg)     Vital signs reviewed - Note on arrival 02 sats  92% on RA and BP  172/90      HEENT: nl dentition, turbinates bilaterally, and oropharynx. Nl external ear canals without cough reflex   NECK :  without JVD/Nodes/TM/ nl carotid upstrokes bilaterally   LUNGS: no acc muscle use,  Nl contour chest which is clear to A and P bilaterally without cough on insp or exp maneuvers   CV:  RRR  no s3 or murmur or increase in P2, and no edema   ABD:  Obese soft and nontender with limited  inspiratory excursion in the supine position. No bruits or organomegaly appreciated, bowel sounds nl  MS:  Nl gait/ ext warm with  R BKA prostesis  calf tenderness, cyanosis or clubbing No obvious joint restrictions   SKIN: warm and dry without lesions    NEURO:  alert, approp, nl sensorium with  no motor or cerebellar deficits apparent.                Assessment:

## 2018-06-25 NOTE — Progress Notes (Signed)
PFT completed today. 06/25/18 

## 2018-06-25 NOTE — Assessment & Plan Note (Signed)
S/p pna ? ali / 2012 > on 02 ever since hs  ONO RA 03/2014:  Desat to 72% Ambulatory ox 2015:  desat with walking CT chest 04/2014 with no PE, no ISLD, mild basilar atx from obesity. PFTs 04/25/14   VC 2.82 (87%) no airflow obst/ ERV 42% and dlco 50% > corrects to 60% for alv vol V/Q 07/2014:  No chronic TE disease Echo with DD, unable to estimate PA pressures, but nothing overt suggestive of clinically significant pulmonary htn. [place] 06/2014:  PA 43/13, mean PA 25, PCWP 10, PVR 2 WU, negative shunt run.  - 09/29/2015   Walked 3lpm   2 laps @ 185 ft each stopped due to sob / slow pace no desats  - 03/24/2017 Patient Saturations on Room Air at Rest = 88%--increased to 96%3lpm pulsed o2  - Echo 04/04/17 - Technically difficult study. Compared to a prior study in 2015, there is now moderate LVH and the LVEF is higher at 65-70%. No obvious PFO noted by saline microbubble contrast. There is moderate TR with an RVSP of 56 mmHg and a normal, collapsing IVC. Consistent with moderate pulmonary hypertension. - ono RA  05/12/17  desat x 2: 55 min - 05/27/2017  rec 3lpm hs and repeat > ? Done ?> repeat  03/24/2018 >>>  - HC03   02/06/18  = 22  - 03/24/2018   Walked 3lpm Pulsed x one lap @ 185 stopped due to  Sob/ nl pace, sats 91% at end > referred for POC  - ONO 03/25/18 ? Done on RA desat x 380 min < 89%  So   03/31/2018 >>>  rec 3lpm and repeat > done 04/17/18 with desat x 17min on 3lpm > no change rx   - PFT's  06/25/2018  FEV1 2.13 (95 % ) ratio 74  p 7 % improvement from saba p nothing prior to study with DLCO  46 % corrects to 55  % for alv volume  With ERV 4%    Doing better now that on adequate 02 hs and ex (3lpm) but no need at rest  The only opportunity to reverse any of the findings on pfts is wt loss  (see separate a/p)   Pulmonary f/u can be q 12 months / sooner prn

## 2018-06-25 NOTE — Assessment & Plan Note (Addendum)
NPSG 08/2014:  AHI 9/hr  But Completely intolerant of cpap.   - see chronic resp failure    She denies any hypersomnolence and is losing wt on the noct 02 which is adequate per ono on 3lpm so no changes needed to recs

## 2018-06-25 NOTE — Patient Instructions (Addendum)
Monitor your blood pressure and your 02 levels and let us know if trending down on 3lpm   Keep up the exercising as much as you can   Please schedule a follow up visit in 12  months but call sooner if needed

## 2018-06-25 NOTE — Assessment & Plan Note (Signed)
Not optimally controlled on present regimen. I reviewed this with the patient and emphasized importance of follow-up with primary care.     Could increase lopressor to bid dosing since no asthma apparent     I had an extended discussion with the patient reviewing all relevant studies completed to date and  lasting 15 to 20 minutes of a 25 minute visit    Each maintenance medication was reviewed in detail including most importantly the difference between maintenance and prns and under what circumstances the prns are to be triggered using an action plan format that is not reflected in the computer generated alphabetically organized AVS.     Please see AVS for specific instructions unique to this visit that I personally wrote and verbalized to the the pt in detail and then reviewed with pt  by my nurse highlighting any  changes in therapy recommended at today's visit to their plan of care.

## 2018-06-25 NOTE — Assessment & Plan Note (Addendum)
Complicated by HBP/ Hyperlipidemia/ DM with ERV =4% on pfts 06/25/2018   Body mass index is 34.41 kg/m.  -  trending down/ encouraged  No results found for: TSH   Contributing to gerd risk/ doe/reviewed the need and the process to achieve and maintain neg calorie balance > defer f/u primary care including intermittently monitoring thyroid status

## 2019-01-01 ENCOUNTER — Telehealth: Payer: Self-pay | Admitting: Internal Medicine

## 2019-01-01 NOTE — Telephone Encounter (Signed)
Pt called today asking to make OV with RMR ASAP for her diarrhea. I explained to her that we were not seeing patients in the office due to Covid-19, but we were scheduling Virtual phone visits. I tried explaining Facetime and ZOOM to her and she said she would get her daugter to help her and call us back on Monday

## 2019-06-29 ENCOUNTER — Ambulatory Visit (INDEPENDENT_AMBULATORY_CARE_PROVIDER_SITE_OTHER): Payer: Medicare Other | Admitting: Internal Medicine

## 2019-06-29 ENCOUNTER — Other Ambulatory Visit: Payer: Self-pay

## 2019-06-29 ENCOUNTER — Encounter: Payer: Self-pay | Admitting: Internal Medicine

## 2019-06-29 ENCOUNTER — Ambulatory Visit (INDEPENDENT_AMBULATORY_CARE_PROVIDER_SITE_OTHER): Payer: Medicare Other

## 2019-06-29 DIAGNOSIS — I272 Pulmonary hypertension, unspecified: Secondary | ICD-10-CM | POA: Diagnosis not present

## 2019-06-29 DIAGNOSIS — K639 Disease of intestine, unspecified: Secondary | ICD-10-CM | POA: Diagnosis not present

## 2019-06-29 DIAGNOSIS — J9611 Chronic respiratory failure with hypoxia: Secondary | ICD-10-CM | POA: Diagnosis not present

## 2019-06-29 NOTE — Patient Instructions (Signed)
Classic pain pattern typical of gas pain:   very limited distribution of pain locations, daytime, not usually exacerbated by exercise  or coughing, worse in sitting position, frequently associated with generalized abd bloating, not as likely to be present supine due to the dome effect of the diaphragm which  is  canceled in that position. Frequently these patients have had multiple negative GI workups and CT scans.  Treatment consists of avoiding foods that cause gas (especially boiled eggs, mexcican food but especially  beans and undercooked vegetables like  spinach and some salads)  and citrucel 1 heaping tsp twice daily with a large glass of water.  Pain should improve w/in 2 weeks and if not then consider further GI work up.      Please remember to go to the  x-ray department  for your tests - we will call you with the results when they are available     Please schedule a follow up visit in 12  months but call sooner if needed

## 2019-06-29 NOTE — Progress Notes (Signed)
Subjective:     Patient ID: ARYAL JACOBOWITZ, female   DOB: 1946/06/23     MRN: [id number]    Brief patient profile:  77   yowf quit smoking 1990 with ? pna in 2011/2012  [place] all acute changes resolved p rx for pna but ever since has required 02 at hs/ and with activity and followed previously by Dr [name] for chronic resp failure but no airflow obst on spirometry    History of Present Illness  09/29/2015 1st  office visit/ Jameer Storie  Transition of care  Chief Complaint  Patient presents with  . Follow-up    Former Dr [name] pt. Breathing is unchanged. She does notice it gets worse with colder weather.   sob bending over / struggles to walk eg HT due to R leg prosthesis and doe  So has used scooter x years rec No change in recommendations for 02  Try to keep your weight trending down if at all possible to help your lower lobes get better airflow  Please schedule a follow up visit in 12 months but call sooner if needed    04/04/17 Compared to a prior study in 2015,   there is now moderate LVH and the LVEF is higher at 65-70%. No   obvious PFO noted by saline microbubble contrast. There is   moderate TR with an RVSP of 56 mmHg and a normal, collapsing IVC.   Consistent with moderate pulmonary hypertension.    03/24/2018  f/u ov/Maresa Morash re:  Chronic resp failure ? Cor pulmonale related to obesity s hypoventilation  Chief Complaint  Patient presents with  . Follow-up     on 3L with exertion and at night, increased bp, stopped losartan, never recieved her portable tank, increased niffedical makes her legs burn when it was increases, face feels hot, stayed on 30 mg   Dyspnea:  3lpm [employer] only = MMRC3 = can't walk 100 yards even at a slow pace at a flat grade s stopping due to sob   Cough: sensation of drainage/ f/u by ENT [facility] rec gi eval Rourke Sleeping: on either side/ feels nauseated all her life if lies on back   02: 3lpm 24/7  rec Wear 3lpm 24/7 > we will refer for  POC  We sill schedule overnight 02 sats on 3 lpm to make sure that's adequate       06/25/2018  f/u ov/Sharnay Cashion re: chronic  resp failure s/p ALI from ? Pna 2012 > chronic 02 dep/ obesity/ Chief Complaint  Patient presents with  . Follow-up    PFT done today. Her breathing is unchangd since the last visit.   Dyspnea:  50 ft on 3lpm  Cough daytime only :throat tickle >  Referred to GI by ent but hasn't gone yet / on dexilant already  Sleeping: bed flat/ one pillow/ hurts in back and gets nauseated - fine on sides  SABA use: none  02: 3lpm at hs and with activity  rec Monitor your blood pressure and your 02 levels and let us know if trending down on 3lpm  Keep up the exercising as much as you can  Please schedule a follow up visit in 12  months but call sooner if needed    06/29/2019  f/u ov/Maico Mulvehill re:   resp failure s/p ali/  L ant/lat cp x years post always sitting /waxes and wanes over sev min to an hour then resolves s pleuritic or ex component/ never with ex  or supine Chief Complaint  Patient presents with  . Follow-up    Breathing has been worse since Dec 2019 when she had a respiratory infection.   Dyspnea:  50 ft 02 ok only 3lpm  Cough: no Sleeping: fine on side / bed is flat  SABA use: no albuterol 02: 3lpm hs and with activity and stays around 94%     No obvious day to day or daytime variability or assoc excess/ purulent sputum or mucus plugs or hemoptysis or cp or chest tightness, subjective wheeze or overt sinus or hb symptoms.   Sleeping  without nocturnal  or early am exacerbation  of respiratory  c/o's or need for noct saba. Also denies any obvious fluctuation of symptoms with weather or environmental changes or other aggravating or alleviating factors except as outlined above   No unusual exposure hx or h/o childhood pna/ asthma or knowledge of premature birth.  Current Allergies, Complete Past Medical History, Past Surgical History, Family History, and Social History  were reviewed in [employer] record.  ROS  The following are not active complaints unless bolded Hoarseness, sore throat, dysphagia, dental problems, itching, sneezing,  nasal congestion or discharge of excess mucus or purulent secretions, ear ache,   fever, chills, sweats, unintended wt loss or wt gain, classically pleuritic or exertional cp,  orthopnea pnd or arm/hand swelling  or leg swelling, presyncope, palpitations, abdominal pain, anorexia, nausea, vomiting, diarrhea  or change in bowel habits or change in bladder habits, change in stools or change in urine, dysuria, hematuria,  rash, arthralgias/back pain , visual complaints, headache, numbness, weakness or ataxia or problems with walking due to prosthesis R BKA 1990  or coordination,  change in mood or  memory.        Current Meds  Medication Sig  . aspirin EC 81 MG tablet Take 81 mg by mouth daily.    . BD PEN NEEDLE NANO U/F 32G X 4 MM MISC 2 (two) times daily. as directed  . Calcium Carb-Cholecalciferol (CALCIUM 1000 + D PO) Take 1,000 mg by mouth daily.  . cetirizine (ZYRTEC) 10 MG tablet Take 10 mg by mouth daily.  . Coenzyme Q10 (CO Q 10 PO) Take 300 mg by mouth daily.  Marland Kitchen dexlansoprazole (DEXILANT) 60 MG capsule Take 1 capsule (60 mg total) by mouth daily.  . fluticasone (FLONASE) 50 MCG/ACT nasal spray Place 1 spray into the nose daily.  Marland Kitchen gabapentin (NEURONTIN) 100 MG capsule Take 100 mg by mouth 3 (three) times daily.  . halobetasol (ULTRAVATE) 0.05 % cream   . ibuprofen (ADVIL,MOTRIN) 200 MG tablet Take 600 mg by mouth every 6 (six) hours as needed. For pain  . Insulin Glargine (BASAGLAR KWIKPEN) 100 UNIT/ML SOPN Inject 50 Units into the skin daily.  . Insulin Pen Needle (BD PEN NEEDLE NANO U/F) 32G X 4 MM MISC USE TWICE DAILY AS DIRECTED  . losartan (COZAAR) 100 MG tablet Take 100 mg by mouth daily.  Marland Kitchen NIFEdipine (PROCARDIA XL/ADALAT-CC) 30 MG 24 hr tablet Take 30 mg by mouth 2 (two) times daily.   .  OXYGEN 3 lpm with sleep and exertion  APS  . Probiotic Product (PROBIOTIC ADVANCED PO) Take by mouth daily.  . SitaGLIPtin-MetFORMIN HCl (JANUMET XR) 50-1000 MG TB24 Take 1 tablet by mouth 2 (two) times daily.   . VOLTAREN 1 % GEL APPLY 2 GM TO THE AFFECTED JOINT 4 TIMES DAILY AS NEEDED.  Objective:   Physical Exam    amb  Obese wf nad    06/29/2019    208  06/25/2018    204  03/24/2018        201 03/24/2017         211  09/30/2016       216   09/29/15 221 lb 6.4 oz (100.426 kg)  08/16/15 214 lb (97.07 kg)  05/17/15 216 lb 8 oz (98.204 kg)     Vital signs reviewed - Note on arrival 02 sats  96% on RA        HEENT : pt wearing mask not removed for exam due to covid -19 concerns.    NECK :  without JVD/Nodes/TM/ nl carotid upstrokes bilaterally   LUNGS: no acc muscle use,  Nl contour chest which is clear to A and P bilaterally without cough on insp or exp maneuvers   CV:  RRR  no s3 or murmur or increase in P2, and no edema   ABD: mod obese  soft and nontender with nl inspiratory excursion in the supine position. No bruits or organomegaly appreciated, bowel sounds nl  MS:  Nl gait/ ext warm with R bka prosthesis, calf tenderness, cyanosis or clubbing No obvious joint restrictions   SKIN: warm and dry without lesions    NEURO:  alert, approp, nl sensorium with  no motor or cerebellar deficits apparent.        CXR PA and Lateral:   06/29/2019 :    I personally reviewed images and agree with radiology impression as follows:   No active cardiopulmonary disease.       Assessment:

## 2019-06-30 NOTE — Progress Notes (Signed)
Spoke with pt and notified of results per Dr. Wert. Pt verbalized understanding and denied any questions. 

## 2019-07-02 ENCOUNTER — Encounter: Payer: Self-pay | Admitting: Internal Medicine

## 2019-07-02 DIAGNOSIS — K639 Disease of intestine, unspecified: Secondary | ICD-10-CM

## 2019-07-02 HISTORY — DX: Disease of intestine, unspecified: K63.9

## 2019-07-02 NOTE — Assessment & Plan Note (Signed)
Onset around 2019  - rec rx for IBS/ diet 06/29/2019   Classic subdiaphragmatic pain pattern suggests ibs:  Stereotypical, migratory with a very limited distribution of pain locations, daytime, not usually exacerbated by exercise  or coughing, worse in sitting position, frequently associated with generalized abd bloating, not as likely to be present supine due to the dome effect of the diaphragm which  is  canceled in that position. Frequently these patients have had multiple negative GI workups and CT scans.  Treatment consists of avoiding foods that cause gas (especially boiled eggs, mexcican food but especially  beans and undercooked vegetables like  spinach and some salads)  and citrucel 1 heaping tsp twice daily with a large glass of water.  Pain should improve w/in 2 weeks and if not then consider further GI work up.

## 2019-07-02 NOTE — Assessment & Plan Note (Signed)
S/p pna ? ali / 2012 > on 02 ever since hs  ONO RA 03/2014:  Desat to 72% Ambulatory ox 2015:  desat with walking CT chest 04/2014 with no PE, no ISLD, mild basilar atx from obesity. PFTs 04/25/14   VC 2.82 (87%) no airflow obst/ ERV 42% and dlco 50% > corrects to 60% for alv vol V/Q 07/2014:  No chronic TE disease Echo with DD, unable to estimate PA pressures, but nothing overt suggestive of clinically significant pulmonary htn. [place] 06/2014:  PA 43/13, mean PA 25, PCWP 10, PVR 2 WU, negative shunt run.  - 09/29/2015   Walked 3lpm   2 laps @ 185 ft each stopped due to sob / slow pace no desats  - 03/24/2017 Patient Saturations on Room Air at Rest = 88%--increased to 96%3lpm pulsed o2  - Echo 04/04/17 - Technically difficult study. Compared to a prior study in 2015, there is now moderate LVH and the LVEF is higher at 65-70%. No obvious PFO noted by saline microbubble contrast. There is moderate TR with an RVSP of 56 mmHg and a normal, collapsing IVC. Consistent with moderate pulmonary hypertension. - ono RA  05/12/17  desat x 2: 55 min - 05/27/2017  rec 3lpm hs and repeat > ? Done ?> repeat  03/24/2018 >>>  - HC03   02/06/18  = 22  - 03/24/2018   Walked 3lpm Pulsed x one lap @ 185 stopped due to  Sob/ nl pace, sats 91% at end > referred for POC  - ONO 03/25/18 ? Done on RA desat x 380 min < 89%  So   03/31/2018 >>>  rec 3lpm and repeat > done 04/17/18 with desat x 71min on 3lpm > no change rx  - PFT's  06/25/2018  FEV1 2.13 (95 % ) ratio 74  p 7 % improvement from saba p nothing prior to study with DLCO  46 % corrects to 55  % for alv volume  And ERV 4%   - 06/29/2019   Walked 3lpm  x one lap =  approx 250 ft - stopped due to  Back pain min  sob with sats 92% at slow pace    Adequate control on present rx, reviewed in detail with pt > no change in rx needed     I had an extended discussion with the patient reviewing all relevant studies completed to date and  lasting 15 to 20 minutes of a 25 minute  visit  which included directly observing ambulatory 02 saturation study documented in a/p section of  today's  office note.  Each maintenance medication was reviewed in detail including most importantly the difference between maintenance and prns and under what circumstances the prns are to be triggered using an action plan format that is not reflected in the computer generated alphabetically organized AVS.     Please see AVS for specific instructions unique to this visit that I personally wrote and verbalized to the the pt in detail and then reviewed with pt  by my nurse highlighting any changes in therapy recommended at today's visit .

## 2019-07-02 NOTE — Assessment & Plan Note (Signed)
Echo 04/04/17 Compared to a prior study in 2015,   there is now moderate LVH and the LVEF is higher at 65-70%. No   obvious PFO noted by saline microbubble contrast. There is   moderate TR with an RVSP of 56 mmHg and a normal, collapsing IVC.   Consistent with moderate pulmonary hypertension.   C/w WHO III  rx  = adequate 02 / wt loss if possible

## 2019-07-02 NOTE — Assessment & Plan Note (Signed)
Complicated by HBP/ Hyperlipidemia/ DM with ERV =4% on pfts 06/25/2018   Body mass index is 31.17 kg/m.  -  trending up No results found for: TSH   Contributing to gerd risk/ doe/reviewed the need and the process to achieve and maintain neg calorie balance > defer f/u primary care including intermittently monitoring thyroid status

## 2019-09-20 ENCOUNTER — Inpatient Hospital Stay (HOSPITAL_COMMUNITY)
Admission: EM | Admit: 2019-09-20 | Discharge: 2019-09-24 | DRG: 871 | Disposition: A | Discharge status: Home Health | Payer: Medicare Other | Attending: Internal Medicine | Admitting: Internal Medicine

## 2019-09-20 ENCOUNTER — Emergency Department (HOSPITAL_COMMUNITY): Payer: Medicare Other

## 2019-09-20 ENCOUNTER — Encounter (HOSPITAL_COMMUNITY): Payer: Self-pay | Admitting: Emergency Medicine

## 2019-09-20 DIAGNOSIS — E43 Unspecified severe protein-calorie malnutrition: Secondary | ICD-10-CM | POA: Diagnosis present

## 2019-09-20 DIAGNOSIS — Z9981 Dependence on supplemental oxygen: Secondary | ICD-10-CM

## 2019-09-20 DIAGNOSIS — Z794 Long term (current) use of insulin: Secondary | ICD-10-CM | POA: Diagnosis not present

## 2019-09-20 DIAGNOSIS — E66811 Obesity, class 1: Secondary | ICD-10-CM

## 2019-09-20 DIAGNOSIS — G9341 Metabolic encephalopathy: Secondary | ICD-10-CM | POA: Diagnosis present

## 2019-09-20 DIAGNOSIS — R6521 Severe sepsis with septic shock: Secondary | ICD-10-CM | POA: Diagnosis not present

## 2019-09-20 DIAGNOSIS — Z89511 Acquired absence of right leg below knee: Secondary | ICD-10-CM

## 2019-09-20 DIAGNOSIS — E669 Obesity, unspecified: Secondary | ICD-10-CM | POA: Diagnosis not present

## 2019-09-20 DIAGNOSIS — Z9049 Acquired absence of other specified parts of digestive tract: Secondary | ICD-10-CM

## 2019-09-20 DIAGNOSIS — E1151 Type 2 diabetes mellitus with diabetic peripheral angiopathy without gangrene: Secondary | ICD-10-CM | POA: Diagnosis present

## 2019-09-20 DIAGNOSIS — Z79899 Other long term (current) drug therapy: Secondary | ICD-10-CM

## 2019-09-20 DIAGNOSIS — E872 Acidosis, unspecified: Secondary | ICD-10-CM

## 2019-09-20 DIAGNOSIS — N390 Urinary tract infection, site not specified: Secondary | ICD-10-CM | POA: Diagnosis not present

## 2019-09-20 DIAGNOSIS — Z833 Family history of diabetes mellitus: Secondary | ICD-10-CM

## 2019-09-20 DIAGNOSIS — I248 Other forms of acute ischemic heart disease: Secondary | ICD-10-CM | POA: Diagnosis not present

## 2019-09-20 DIAGNOSIS — I4891 Unspecified atrial fibrillation: Secondary | ICD-10-CM | POA: Diagnosis present

## 2019-09-20 DIAGNOSIS — M810 Age-related osteoporosis without current pathological fracture: Secondary | ICD-10-CM | POA: Diagnosis present

## 2019-09-20 DIAGNOSIS — E1165 Type 2 diabetes mellitus with hyperglycemia: Secondary | ICD-10-CM | POA: Diagnosis present

## 2019-09-20 DIAGNOSIS — N179 Acute kidney failure, unspecified: Secondary | ICD-10-CM | POA: Diagnosis not present

## 2019-09-20 DIAGNOSIS — R627 Adult failure to thrive: Secondary | ICD-10-CM | POA: Diagnosis not present

## 2019-09-20 DIAGNOSIS — A419 Sepsis, unspecified organism: Secondary | ICD-10-CM | POA: Diagnosis not present

## 2019-09-20 DIAGNOSIS — Z7901 Long term (current) use of anticoagulants: Secondary | ICD-10-CM

## 2019-09-20 DIAGNOSIS — I5033 Acute on chronic diastolic (congestive) heart failure: Secondary | ICD-10-CM | POA: Diagnosis not present

## 2019-09-20 DIAGNOSIS — I272 Pulmonary hypertension, unspecified: Secondary | ICD-10-CM | POA: Diagnosis present

## 2019-09-20 DIAGNOSIS — Z20822 Contact with and (suspected) exposure to covid-19: Secondary | ICD-10-CM | POA: Diagnosis not present

## 2019-09-20 DIAGNOSIS — I340 Nonrheumatic mitral (valve) insufficiency: Secondary | ICD-10-CM | POA: Diagnosis not present

## 2019-09-20 DIAGNOSIS — E785 Hyperlipidemia, unspecified: Secondary | ICD-10-CM | POA: Diagnosis present

## 2019-09-20 DIAGNOSIS — E8809 Other disorders of plasma-protein metabolism, not elsewhere classified: Secondary | ICD-10-CM | POA: Diagnosis present

## 2019-09-20 DIAGNOSIS — N182 Chronic kidney disease, stage 2 (mild): Secondary | ICD-10-CM | POA: Diagnosis present

## 2019-09-20 DIAGNOSIS — Z87891 Personal history of nicotine dependence: Secondary | ICD-10-CM

## 2019-09-20 DIAGNOSIS — R945 Abnormal results of liver function studies: Secondary | ICD-10-CM

## 2019-09-20 DIAGNOSIS — R6520 Severe sepsis without septic shock: Secondary | ICD-10-CM | POA: Diagnosis not present

## 2019-09-20 DIAGNOSIS — J9611 Chronic respiratory failure with hypoxia: Secondary | ICD-10-CM | POA: Diagnosis not present

## 2019-09-20 DIAGNOSIS — I130 Hypertensive heart and chronic kidney disease with heart failure and stage 1 through stage 4 chronic kidney disease, or unspecified chronic kidney disease: Secondary | ICD-10-CM | POA: Diagnosis not present

## 2019-09-20 DIAGNOSIS — I5031 Acute diastolic (congestive) heart failure: Secondary | ICD-10-CM | POA: Diagnosis not present

## 2019-09-20 DIAGNOSIS — E1122 Type 2 diabetes mellitus with diabetic chronic kidney disease: Secondary | ICD-10-CM | POA: Diagnosis present

## 2019-09-20 DIAGNOSIS — I959 Hypotension, unspecified: Secondary | ICD-10-CM

## 2019-09-20 DIAGNOSIS — J449 Chronic obstructive pulmonary disease, unspecified: Secondary | ICD-10-CM | POA: Diagnosis present

## 2019-09-20 DIAGNOSIS — E119 Type 2 diabetes mellitus without complications: Secondary | ICD-10-CM | POA: Diagnosis not present

## 2019-09-20 DIAGNOSIS — G4733 Obstructive sleep apnea (adult) (pediatric): Secondary | ICD-10-CM | POA: Diagnosis present

## 2019-09-20 DIAGNOSIS — I480 Paroxysmal atrial fibrillation: Secondary | ICD-10-CM | POA: Diagnosis present

## 2019-09-20 DIAGNOSIS — Z6830 Body mass index (BMI) 30.0-30.9, adult: Secondary | ICD-10-CM

## 2019-09-20 DIAGNOSIS — K219 Gastro-esophageal reflux disease without esophagitis: Secondary | ICD-10-CM | POA: Diagnosis present

## 2019-09-20 LAB — CBG MONITORING, ED: Glucose-Capillary: 126 mg/dL — ABNORMAL HIGH (ref 70–99)

## 2019-09-20 LAB — COMPREHENSIVE METABOLIC PANEL
ALT: 69 U/L — ABNORMAL HIGH (ref 0–44)
AST: 98 U/L — ABNORMAL HIGH (ref 15–41)
Albumin: 2.6 g/dL — ABNORMAL LOW (ref 3.5–5.0)
Alkaline Phosphatase: 83 U/L (ref 38–126)
Anion gap: 16 — ABNORMAL HIGH (ref 5–15)
BUN: 27 mg/dL — ABNORMAL HIGH (ref 8–23)
CO2: 17 mmol/L — ABNORMAL LOW (ref 22–32)
Calcium: 8.8 mg/dL — ABNORMAL LOW (ref 8.9–10.3)
Chloride: 104 mmol/L (ref 98–111)
Creatinine, Ser: 2.37 mg/dL — ABNORMAL HIGH (ref 0.44–1.00)
GFR calc Af Amer: 23 mL/min — ABNORMAL LOW (ref >60)
GFR calc non Af Amer: 20 mL/min — ABNORMAL LOW (ref >60)
Glucose, Bld: 173 mg/dL — ABNORMAL HIGH (ref 70–99)
Potassium: 4 mmol/L (ref 3.5–5.1)
Sodium: 137 mmol/L (ref 135–145)
Total Bilirubin: 1.1 mg/dL (ref 0.3–1.2)
Total Protein: 5.9 g/dL — ABNORMAL LOW (ref 6.5–8.1)

## 2019-09-20 LAB — CBC WITH DIFFERENTIAL/PLATELET
Abs Immature Granulocytes: 0.44 10*3/uL — ABNORMAL HIGH (ref 0.00–0.07)
Basophils Absolute: 0.1 10*3/uL (ref 0.0–0.1)
Basophils Relative: 0 %
Eosinophils Absolute: 0 10*3/uL (ref 0.0–0.5)
Eosinophils Relative: 0 %
HCT: 35 % — ABNORMAL LOW (ref 36.0–46.0)
Hemoglobin: 11.2 g/dL — ABNORMAL LOW (ref 12.0–15.0)
Immature Granulocytes: 1 %
Lymphocytes Relative: 1 %
Lymphs Abs: 0.4 10*3/uL — ABNORMAL LOW (ref 0.7–4.0)
MCH: 28.9 pg (ref 26.0–34.0)
MCHC: 32 g/dL (ref 30.0–36.0)
MCV: 90.2 fL (ref 80.0–100.0)
Monocytes Absolute: 1 10*3/uL (ref 0.1–1.0)
Monocytes Relative: 3 %
Neutro Abs: 29.1 10*3/uL — ABNORMAL HIGH (ref 1.7–7.7)
Neutrophils Relative %: 95 %
Platelets: 314 10*3/uL (ref 150–400)
RBC: 3.88 MIL/uL (ref 3.87–5.11)
RDW: 13.9 % (ref 11.5–15.5)
WBC Morphology: INCREASED
WBC: 31 10*3/uL — ABNORMAL HIGH (ref 4.0–10.5)
nRBC: 0 % (ref 0.0–0.2)

## 2019-09-20 LAB — URINALYSIS, ROUTINE W REFLEX MICROSCOPIC
Bilirubin Urine: NEGATIVE
Glucose, UA: NEGATIVE mg/dL
Hgb urine dipstick: NEGATIVE
Ketones, ur: NEGATIVE mg/dL
Nitrite: POSITIVE — AB
Protein, ur: >=300 mg/dL — AB
Specific Gravity, Urine: 1.018 (ref 1.005–1.030)
WBC, UA: >50 WBC/hpf — ABNORMAL HIGH (ref 0–5)
pH: 5 (ref 5.0–8.0)

## 2019-09-20 LAB — POC SARS CORONAVIRUS 2 AG -  ED
SARS Coronavirus 2 Ag: NEGATIVE
SARS Coronavirus 2 Ag: NEGATIVE

## 2019-09-20 LAB — RESPIRATORY PANEL BY RT PCR (FLU A&B, COVID)
Influenza A by PCR: NEGATIVE
Influenza B by PCR: NEGATIVE
SARS Coronavirus 2 by RT PCR: NEGATIVE

## 2019-09-20 LAB — LACTIC ACID, PLASMA
Lactic Acid, Venous: 5 mmol/L (ref 0.5–1.9)
Lactic Acid, Venous: 4.7 mmol/L (ref 0.5–1.9)

## 2019-09-20 LAB — PROTIME-INR
INR: 1.3 — ABNORMAL HIGH (ref 0.8–1.2)
Prothrombin Time: 16.1 seconds — ABNORMAL HIGH (ref 11.4–15.2)

## 2019-09-20 LAB — APTT: aPTT: 36 seconds (ref 24–36)

## 2019-09-20 MED ORDER — INSULIN ASPART 100 UNIT/ML ~~LOC~~ SOLN
3.0000 [IU] | SUBCUTANEOUS | Status: DC
  Administered 2019-09-21: 03:00:00 3 [IU] via SUBCUTANEOUS

## 2019-09-20 MED ORDER — LACTATED RINGERS IV BOLUS (SEPSIS)
1000.0000 mL | Freq: Once | INTRAVENOUS | Status: AC
  Administered 2019-09-20 (×2): 1000 mL via INTRAVENOUS

## 2019-09-20 MED ORDER — CEFTRIAXONE SODIUM 1 G IJ SOLR
1.0000 g | INTRAMUSCULAR | Status: DC
  Administered 2019-09-20 – 2019-09-22 (×3): 1 g via INTRAVENOUS
  Filled 2019-09-20 (×3): qty 10
  Filled 2019-09-20: qty 1

## 2019-09-20 MED ORDER — PHENYLEPHRINE HCL-NACL 10-0.9 MG/250ML-% IV SOLN
25.0000 ug/min | INTRAVENOUS | Status: DC
  Filled 2019-09-20: qty 250

## 2019-09-20 MED ORDER — LACTATED RINGERS IV BOLUS
1000.0000 mL | Freq: Once | INTRAVENOUS | Status: AC
  Administered 2019-09-20: 19:00:00 1000 mL via INTRAVENOUS

## 2019-09-20 MED ORDER — PANTOPRAZOLE SODIUM 40 MG IV SOLR
40.0000 mg | Freq: Every day | INTRAVENOUS | Status: DC
  Administered 2019-09-21 (×2): 40 mg via INTRAVENOUS
  Filled 2019-09-20 (×2): qty 40

## 2019-09-20 MED ORDER — NOREPINEPHRINE 4 MG/250ML-% IV SOLN
0.0000 ug/min | INTRAVENOUS | Status: DC
  Administered 2019-09-20: 2 ug/min via INTRAVENOUS
  Filled 2019-09-20 (×2): qty 250

## 2019-09-20 MED ORDER — SODIUM BICARBONATE-DEXTROSE 150-5 MEQ/L-% IV SOLN
150.0000 meq | INTRAVENOUS | Status: DC
  Administered 2019-09-20: 23:00:00 150 meq via INTRAVENOUS
  Filled 2019-09-20: qty 1000

## 2019-09-20 MED ORDER — HYDROCORTISONE NA SUCCINATE PF 100 MG IJ SOLR
100.0000 mg | Freq: Three times a day (TID) | INTRAMUSCULAR | Status: DC
  Administered 2019-09-20 – 2019-09-22 (×6): 100 mg via INTRAVENOUS
  Filled 2019-09-20 (×6): qty 2

## 2019-09-20 MED ORDER — LACTATED RINGERS IV SOLN: INTRAVENOUS | Status: DC

## 2019-09-20 MED ORDER — SODIUM CHLORIDE 0.9 % IV SOLN: 250.0000 mL | INTRAVENOUS | Status: DC

## 2019-09-20 MED ORDER — HEPARIN SODIUM (PORCINE) 5000 UNIT/ML IJ SOLN
5000.0000 [IU] | Freq: Three times a day (TID) | INTRAMUSCULAR | Status: DC
  Administered 2019-09-21: 5000 [IU] via SUBCUTANEOUS
  Filled 2019-09-20: qty 1

## 2019-09-20 MED ORDER — LACTATED RINGERS IV BOLUS (SEPSIS)
1000.0000 mL | Freq: Once | INTRAVENOUS | Status: AC
  Administered 2019-09-20: 1000 mL via INTRAVENOUS

## 2019-09-20 MED ORDER — ALBUMIN HUMAN 5 % IV SOLN
25.0000 g | Freq: Once | INTRAVENOUS | Status: AC
  Administered 2019-09-21: 25 g via INTRAVENOUS
  Filled 2019-09-20: qty 500

## 2019-09-20 NOTE — ED Triage Notes (Signed)
Pt in from home with family via [facility] for increased lethargy since this am. Per EMS, pt has had UTI and finished 10 day course of abx 3 days ago. Presents hypotensive, tachypneic and HR 110-150's Afib. Initially 68 palpated for EMS, given 500 ml's NS and recheck 89/47. A&ox4, just lethargic. Wears 3LNC at home, sats 97%, CBG 182 PTA

## 2019-09-20 NOTE — ED Notes (Signed)
Daughter Cecille Rubin called, and was given update. Per daughter, pt has been increasingly confused and dizzy since this am

## 2019-09-20 NOTE — Progress Notes (Signed)
Notified bedside nurse of need to draw repeat lactic acid. 

## 2019-09-20 NOTE — H&P (Addendum)
..   NAME:  Debbie [name], MRN:  [id number], DOB:  02-26-1946, LOS: 0 ADMISSION DATE:  09/20/2019, CONSULTATION DATE:  09/20/2019 REFERRING MD:  Billy Fischer MD - EDP CHIEF COMPLAINT:  AMS and low BP   Brief History   74 yo F w/ PMHx Buerger's disease, OHS, Pulm HTN, GERD, HTN. T2DM presents from home per daughter with increased confusion and dizziness since 09/20/2019 AM. Sepsis secondary to Urinary source s/p 4L IVF started on 2 mcg Levophed. PCCM consulted for admission. History of present illness   (History obtained from EMR and account of other providers)  74 yr old F w/ PMHx Thromboangiitis obliterans Aka Buerger's disease (s/p right BKA at [facility] in [place]. Peripheral arterial dopplers (10/15) with left PT and left peroneal arteries occluded), OHS, Pulm HTN, GERD, HTN. T2DM presents from home per daughter w/ increased confusion & dizziness since 09/20/2019 AM.   Lethargic around 1430 per Cecille Rubin. She didn't know what way to go asking directions. Her sister checked her BG 70 mg/dl on home monitor. Mom was not cold or clammy They called EMS after giving her some juice. Rpt BG 172 mg/dl They thought she was dehydrated. For the last week she has complained that nothing has tasted good. Drinking only Cranberry juice and water.  Cecille Rubin stated that she finished her antibiotics on Saturday. She notices that when this has happened in the past she has had to have multiple rounds of antibiotics to clear UTIs. Also she states that her mother does a lot of baking around the holidays and it causes her significant fatigue. Ms Keilah lives with her husband who is also ill at this time. He was treated for sepsis from a kidney infection that made him bacteremic and is now feeling better.   Pt triaged at 1601. ICU consulted at 2140 For hypotension despite 4L IVF Pt currently on 2 mcg Levophed gtt.  Past Medical History  .Marland Kitchen Active Ambulatory Problems    Diagnosis Date Noted  . DM type 2 (diabetes mellitus, type 2)  ([place])   . Hyperlipidemia   . Essential hypertension   . Encounter for colonoscopy due to history of adenomatous colonic polyps 02/28/2012  . GERD (gastroesophageal reflux disease) 02/28/2012  . DOE (dyspnea on exertion) 04/01/2014  . Chronic respiratory failure with hypoxia ([place]) 04/25/2014  . Atherosclerosis of native arteries of extremity with intermittent claudication ([place]) 08/05/2014  . OSA (obstructive sleep apnea) 09/23/2014  . Morbid obesity due to excess calories ([place]) 09/29/2015  . Pulmonary hypertension ([place]) 03/28/2017  . 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   Resolved Ambulatory Problems    Diagnosis Date Noted  . COPD (chronic obstructive pulmonary disease) ([place])   . Lung mass    Past Medical History:  Diagnosis Date  . Adenomatous polyp 12/04/2006  . Asthma   . Hemorrhoid 12/04/2006  . Hypertension   . Peripheral arterial disease ([place])     Consults:  09/20/2019>> PCCM  Significant Diagnostic Tests:  Echo 04/04/17  Compared to a prior study in 2015, Impressions: - Technically difficult study. Compared to a prior study in 2015,   there is now moderate LVH and the LVEF is higher at 65-70%. No   obvious PFO noted by saline microbubble contrast. There is   moderate TR with an RVSP of 56 mmHg and a normal, collapsing IVC.   Consistent with moderate pulmonary hypertension.   Micro Data:  09/20/2019>> SARSCOV2 negative 09/20/2019>> Influenza A and Influenza B negative for both  Antimicrobials:  09/20/2019>> Rocephin  IV 1 g Q 24   Objective   Blood pressure 116/90, pulse (!) 111, temperature 99.5 F (37.5 C), temperature source Oral, resp. rate (!) 25, SpO2 (!) 89 %.        Intake/Output Summary (Last 24 hours) at 09/20/2019 2306 Last data filed at 09/20/2019 1927 Gross per 24 hour  Intake 4100 ml  Output --  Net 4100 ml   There were no vitals filed for this visit.  Examination: General: awake and alert HENT: normocephalic atraumatic on [place] Lungs: decreased at  bases no appreciable wheezing Cardiovascular: S1 and S2 irregular rate and rhythm Abdomen: soft non-tender norm BS Extremities: BKA on right and Left no pitting edema Neuro: no focal deficits Skin: dry grossly intact   Assessment & Plan:  1. Sepsis suspected Urinary Source Recently seen as an outpatient for an Acute Cystitis and was Rx with Macrobid Received 4L IVF in ED Hypoalbuminemic, Has a H/o Pulm HTN (WHO Class III) and a known PFO on last ECHO Only 2 mcg Levophed gtt UA + Nitrites  Plan: Discontinued Levophed gtt--> only requiring very low dose and pt has peripheral access  Use MAP goal >31mmHg  Albumin IV + Hydrocortisone 100mg  IV Hold ARB  Check TSH  2. New onset Afib w/ Prolonged QT ( QTc 466) EKG on admission shows Atrial fibrillation Borderline repolarization abnormality Most likely related to sepsis K+ 4 Calcium 8.8 Albumin 2.6 Plan: Check Mg, Phos and replete as needed Continue abx IVF treat sepsis source continue on cardiac monitoring  3. H/o Thromboangiitis obliterans  Pt on CCB on review of home meds  2x fold reason Pulm HTN Rx and [place] Per daughter she has had no recent complaints of digit pain Plan: Resume when MAP improves  4. Anion Gap Metabolic Acidosis  Secondary to elevated Lactic Acid Which Can be explained by pt's h/o metformin use Plan: With IVF and time LA should trend down  Bicarb gtt @ 75 cc/hr >> when AG closes discontinue ggt  5. H/o Stiagliptin use Slight Elevation of Transaminases Check a Lipase R/O Pancreatitis a common sequelae of this medication  6. T2DM on Januvia and Glargine Hold Januvia Recommend ISS for BG goal 140-180 mg/dl  7. Obstructive pathology on PFTs no bronchodilator response H/o OHS w/ cor pulmonale, and Pulm HTN Followed by Dr Melvyn Novas in Pulmonary Clinic Pt has a h/o intolerance to CPAP PFT's  06/25/2018  FEV1/FVC ratio was 74  TLC 120% RV 123% pred. FEV1 2.13 (95 % ) ratio 74  p 7 % improvement from saba  p nothing prior to study with DLCO  46 % corrects to 55  % for alv volume  And ERV 4%  On supp O2 for exertion and sleep - 3L documented Plan:  Continue on Supplemental O2  For goal O2 sat >92% On PFT in 2019 pt did not have a response to  When MAP improves resume CCB  6. H/o GERD and IBS Recommend continuing PPI When ok with Diet make sure pt has a good bowel regimen.   Best practice:  Diet: Ok per primary Pain/Anxiety/Delirium protocol (if indicated): opiates and IV meds last line please VAP protocol (if indicated): not intubated DVT prophylaxis: Hep [place] and SCDs GI prophylaxis: PPI Glucose control: ISS keep BG 140-180 mg/dl Mobility: bedrest  Code Status: Full Family Communication: Abe People ( spouse) and Suanne Marker Daughter are listed on Facesheet Disposition: ICU  Labs   CBC: Recent Labs  Lab 09/20/19 1621  WBC 31.0*  NEUTROABS  29.1*  HGB 11.2*  HCT 35.0*  MCV 90.2  PLT [id number]    Basic Metabolic Panel: Recent Labs  Lab 09/20/19 1621  NA 137  K 4.0  CL 104  CO2 17*  GLUCOSE 173*  BUN 27*  CREATININE 2.37*  CALCIUM 8.8*   GFR: CrCl cannot be calculated (Unknown ideal weight.). Recent Labs  Lab 09/20/19 1621 09/20/19 1630 09/20/19 1836  WBC 31.0*  --   --   LATICACIDVEN  --  5.0* 4.7*    Liver Function Tests: Recent Labs  Lab 09/20/19 1621  AST 98*  ALT 69*  ALKPHOS 83  BILITOT 1.1  PROT 5.9*  ALBUMIN 2.6*   No results for input(s): LIPASE, AMYLASE in the last 168 hours. No results for input(s): AMMONIA in the last 168 hours.  ABG    Component Value Date/Time   HCO3 28.8 (H) 06/22/2014 1333   TCO2 30 06/22/2014 1333   O2SAT 75.0 06/22/2014 1333     Coagulation Profile: Recent Labs  Lab 09/20/19 1621  INR 1.3*    Cardiac Enzymes: No results for input(s): CKTOTAL, CKMB, CKMBINDEX, TROPONINI in the last 168 hours.  HbA1C: No results found for: HGBA1C  CBG: Recent Labs  Lab 09/20/19 2242  GLUCAP 126*    Review of Systems:     Marland KitchenMarland KitchenReview of Systems  Unable to perform ROS: Mental status change    Past Medical History  She,  has a past medical history of Adenomatous polyp (12/04/2006), Asthma, COPD (chronic obstructive pulmonary disease) ([place]), DM type 2 (diabetes mellitus, type 2) ([place]), Hemorrhoid (12/04/2006), Hyperlipidemia, Hypertension, and Peripheral arterial disease ([place]).   Surgical History    Past Surgical History:  Procedure Laterality Date  . CHOLECYSTECTOMY    . COLONOSCOPY  12/03/2006   Dr. Delight Ovens, adenomatous polyp  . COLONOSCOPY  03/25/2012   Procedure: COLONOSCOPY;  Surgeon: Daneil Dolin, MD;  Location: [facility];  Service: Endoscopy;  Laterality: N/A;  10:30  . ESOPHAGOGASTRODUODENOSCOPY  11/03/2002   Dr. Gala Romney- normal exam- was done to check for possible foreign body  . Fiberoptic bronchoscopy with endobronchial  ultrasound  10/22/2010   [place]  . Right BKA  1990  . RIGHT HEART CATHETERIZATION N/A 06/22/2014   Procedure: RIGHT HEART CATH;  Surgeon: Larey Dresser, MD;  Location: [facility] CATH LAB;  Service: Cardiovascular;  Laterality: N/A;     Social History   reports that she quit smoking about 29 years ago. Her smoking use included cigarettes. She has a 9.00 pack-year smoking history. She has never used smokeless tobacco. She reports that she does not drink alcohol or use drugs.   Family History   Her family history includes Breast cancer in her sister; COPD in her sister; CVA (age of onset: 31) in her mother; Diabetes in her sister; Diabetes (age of onset: 54) in her mother; Heart disease in her mother; Lung cancer in her father. There is no history of Colon cancer.   Allergies Allergies  Allergen Reactions  . Meloxicam Other (See Comments)    Causes excess Fluid buildup     Home Medications  Prior to Admission medications   Medication Sig Start Date End Date Taking? Authorizing Provider  aspirin EC 81 MG tablet Take 81 mg by mouth daily.      [provider]  BD  PEN NEEDLE NANO U/F 32G X 4 MM MISC 2 (two) times daily. as directed 02/26/15   [provider]  Calcium Carb-Cholecalciferol (CALCIUM 1000 + D  PO) Take 1,000 mg by mouth daily.    [provider]  cetirizine (ZYRTEC) 10 MG tablet Take 10 mg by mouth daily.    [provider]  Coenzyme Q10 (CO Q 10 PO) Take 300 mg by mouth daily.    [provider]  dexlansoprazole (DEXILANT) 60 MG capsule Take 1 capsule (60 mg total) by mouth daily. 09/29/15   Tanda Rockers, MD  fluticasone (FLONASE) 50 MCG/ACT nasal spray Place 1 spray into the nose daily.    [provider]  gabapentin (NEURONTIN) 100 MG capsule Take 100 mg by mouth 3 (three) times daily.    [provider]  halobetasol (ULTRAVATE) 0.05 % cream  08/15/16   [provider]  ibuprofen (ADVIL,MOTRIN) 200 MG tablet Take 600 mg by mouth every 6 (six) hours as needed. For pain    [provider]  Insulin Glargine (BASAGLAR KWIKPEN) 100 UNIT/ML SOPN Inject 50 Units into the skin daily.    [provider]  Insulin Pen Needle (BD PEN NEEDLE NANO U/F) 32G X 4 MM MISC USE TWICE DAILY AS DIRECTED 05/31/15   [provider]  losartan (COZAAR) 100 MG tablet Take 100 mg by mouth daily. 05/31/19   [provider]  NIFEdipine (PROCARDIA XL/ADALAT-CC) 30 MG 24 hr tablet Take 30 mg by mouth 2 (two) times daily.  06/07/11   [provider]  OXYGEN 3 lpm with sleep and exertion  [facility]    [provider]  Probiotic Product (PROBIOTIC ADVANCED PO) Take by mouth daily.    [provider]  SitaGLIPtin-MetFORMIN HCl (JANUMET XR) 50-1000 MG TB24 Take 1 tablet by mouth 2 (two) times daily.  01/10/14   [provider]  VOLTAREN 1 % GEL APPLY 2 GM TO THE AFFECTED JOINT 4 TIMES DAILY AS NEEDED. 08/13/17   [provider]  STAFF NOTE  I, Dr Seward Carol have personally reviewed patient's available data, including medical history,  events of note, physical examination and test results as part of my evaluation. I have discussed with NP and other care providers such as pharmacist, RN and Elink.  The patient is critically ill with multiple organ systems failure and requires high complexity decision making for assessment and support, frequent evaluation and titration of therapies, application of advanced monitoring technologies and extensive interpretation of multiple databases.   Critical Care Time devoted to patient care services described in this note is 31 Minutes. This time reflects time of care of this signee Dr Seward Carol. This critical care time does not reflect procedure time, or teaching time or supervisory time but could involve care discussion time   Dr. Seward Carol Pulmonary Critical Care Medicine  09/20/2019 11:06 PM  Critical care time: 55 mins

## 2019-09-20 NOTE — ED Provider Notes (Signed)
[facility] EMERGENCY DEPARTMENT Provider Note   CSN: [id number] Arrival date & time: 09/20/19  1554     History Chief Complaint  Patient presents with  . Altered Mental Status  . Hypotension    Debbie [name] is a 74 y.o. female.  HPI      74 year old female with a history of COPD, pulmonary hypertension, chronic respiratory failure with hypoxia on 3L pNC that patient reports is secondary to prior pneumonia, diabetes, hypertension, hyperlipidemia, peripheral artery disease, presents with concern for severe generalized weakness in the setting of recent urinary tract infection.  Patient completed 10 days of antibiotics for urinary tract infection 3 days ago, after she had initially presented with dysuria.  It appears in the chart that she had was given Macrobid by her PCP.  Reports yesterday, she developed nausea and vomiting and was unable to keep anything down.  Today, she woke up with severe generalized weakness, unable to get out of bed, severe fatigue.  EMS reported her blood pressure was 68 systolic on arrival.  She denies cough, diarrhea, abdominal pain, flank pain, known fevers, chest pain or dyspnea.   Past Medical History:  Diagnosis Date  . Adenomatous polyp 12/04/2006  . Asthma   . COPD (chronic obstructive pulmonary disease) ([place])   . DM type 2 (diabetes mellitus, type 2) ([place])   . Hemorrhoid 12/04/2006  . Hyperlipidemia   . Hypertension   . Peripheral arterial disease [facility])     Patient Active Problem List   Diagnosis Date Noted  . Sepsis ([place]) 09/20/2019  . Acute lower UTI 09/20/2019  . Atrial fibrillation with RVR ([place]) 09/20/2019  . Septic shock ([place]) 09/20/2019  . 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  . Pulmonary hypertension ([place]) 03/28/2017  . Morbid obesity due to excess calories ([place]) 09/29/2015  . OSA (obstructive sleep apnea) 09/23/2014  . Atherosclerosis of native arteries of extremity with intermittent claudication ([place])  08/05/2014  . Chronic respiratory failure with hypoxia ([place]) 04/25/2014  . DOE (dyspnea on exertion) 04/01/2014  . Encounter for colonoscopy due to history of adenomatous colonic polyps 02/28/2012  . GERD (gastroesophageal reflux disease) 02/28/2012  . DM type 2 (diabetes mellitus, type 2) ([place])   . Hyperlipidemia   . Essential hypertension     Past Surgical History:  Procedure Laterality Date  . CHOLECYSTECTOMY    . COLONOSCOPY  12/03/2006   Dr. Delight Ovens, adenomatous polyp  . COLONOSCOPY  03/25/2012   Procedure: COLONOSCOPY;  Surgeon: Daneil Dolin, MD;  Location: [facility];  Service: Endoscopy;  Laterality: N/A;  10:30  . ESOPHAGOGASTRODUODENOSCOPY  11/03/2002   Dr. Gala Romney- normal exam- was done to check for possible foreign body  . Fiberoptic bronchoscopy with endobronchial  ultrasound  10/22/2010   [place]  . Right BKA  1990  . RIGHT HEART CATHETERIZATION N/A 06/22/2014   Procedure: RIGHT HEART CATH;  Surgeon: Larey Dresser, MD;  Location[facility] C[facility] LAB;  Service: Cardiovascular;  Laterality: N/A;     OB History   No obstetric history on file.     Family History  Problem Relation Age of Onset  . COPD Sister   . Diabetes Sister   . Breast cancer Sister        Mastectomy  . CVA Mother 40  . Heart disease Mother   . Diabetes Mother 59  . Lung cancer Father        lung carcinoma  . Colon cancer Neg Hx     Social History  Tobacco Use  . Smoking status: Former Smoker    Packs/day: 0.50    Years: 18.00    Pack years: 9.00    Types: Cigarettes    Quit date: 09/16/1990    Years since quitting: 29.0  . Smokeless tobacco: Never Used  Substance Use Topics  . Alcohol use: No    Alcohol/week: 0.0 standard drinks  . Drug use: No    Home Medications Prior to Admission medications   Medication Sig Start Date End Date Taking? Authorizing Provider  aspirin EC 81 MG tablet Take 81 mg by mouth daily.      [provider]  BD PEN NEEDLE NANO U/F 32G X 4  MM MISC 2 (two) times daily. as directed 02/26/15   [provider]  Calcium Carb-Cholecalciferol (CALCIUM 1000 + D PO) Take 1,000 mg by mouth daily.    [provider]  cetirizine (ZYRTEC) 10 MG tablet Take 10 mg by mouth daily.    [provider]  Coenzyme Q10 (CO Q 10 PO) Take 300 mg by mouth daily.    [provider]  dexlansoprazole (DEXILANT) 60 MG capsule Take 1 capsule (60 mg total) by mouth daily. 09/29/15   Tanda Rockers, MD  fluticasone (FLONASE) 50 MCG/ACT nasal spray Place 1 spray into the nose daily.    [provider]  gabapentin (NEURONTIN) 100 MG capsule Take 100 mg by mouth 3 (three) times daily.    [provider]  halobetasol (ULTRAVATE) 0.05 % cream  08/15/16   [provider]  ibuprofen (ADVIL,MOTRIN) 200 MG tablet Take 600 mg by mouth every 6 (six) hours as needed. For pain    [provider]  Insulin Glargine (BASAGLAR KWIKPEN) 100 UNIT/ML SOPN Inject 50 Units into the skin daily.    [provider]  Insulin Pen Needle (BD PEN NEEDLE NANO U/F) 32G X 4 MM MISC USE TWICE DAILY AS DIRECTED 05/31/15   [provider]  losartan (COZAAR) 100 MG tablet Take 100 mg by mouth daily. 05/31/19   [provider]  NIFEdipine (PROCARDIA XL/ADALAT-CC) 30 MG 24 hr tablet Take 30 mg by mouth 2 (two) times daily.  06/07/11   [provider]  OXYGEN 3 lpm with sleep and exertion  [facility]    [provider]  Probiotic Product (PROBIOTIC ADVANCED PO) Take by mouth daily.    [provider]  SitaGLIPtin-MetFORMIN HCl (JANUMET XR) 50-1000 MG TB24 Take 1 tablet by mouth 2 (two) times daily.  01/10/14   [provider]  VOLTAREN 1 % GEL APPLY 2 GM TO THE AFFECTED JOINT 4 TIMES DAILY AS NEEDED. 08/13/17   [provider]    Allergies    Meloxicam  Review of Systems   Review of Systems  Constitutional: Positive for activity change, appetite change and fatigue.  Negative for fever.  Respiratory: Negative for cough and shortness of breath.   Cardiovascular: Negative for chest pain.  Gastrointestinal: Positive for nausea and vomiting. Negative for abdominal pain.  Genitourinary: Negative for dysuria (now resolved but did have previously).  Skin: Negative for rash.  Neurological: Negative for headaches.    Physical Exam Updated Vital Signs BP 104/80   Pulse (!) 111   Temp 99.5 F (37.5 C) (Oral)   Resp (!) 25   SpO2 (!) 89%   Physical Exam Vitals and nursing note reviewed.  Constitutional:      General: She is not in acute distress.    Appearance: She is  well-developed. She is ill-appearing and toxic-appearing. She is not diaphoretic.     Comments: Appears sleepy, fatigued but appropriately answering questions, awake  HENT:     Head: Normocephalic and atraumatic.  Eyes:     Conjunctiva/sclera: Conjunctivae normal.  Cardiovascular:     Rate and Rhythm: Normal rate and regular rhythm.     Heart sounds: Normal heart sounds. No murmur. No friction rub. No gallop.   Pulmonary:     Effort: Pulmonary effort is normal. No respiratory distress.     Breath sounds: Normal breath sounds. No wheezing or rales.  Abdominal:     General: There is no distension.     Palpations: Abdomen is soft.     Tenderness: There is no abdominal tenderness. There is no guarding.  Musculoskeletal:        General: No tenderness.     Cervical back: Normal range of motion.  Skin:    General: Skin is warm and dry.     Findings: No erythema or rash.  Neurological:     Mental Status: She is alert and oriented to person, place, and time.     ED Results / Procedures / Treatments   Labs (all labs ordered are listed, but only abnormal results are displayed) Labs Reviewed  LACTIC ACID, PLASMA - Abnormal; Notable for the following components:      Result Value   Lactic Acid, Venous 5.0 (*)    All other components within normal limits  LACTIC ACID, PLASMA -  Abnormal; Notable for the following components:   Lactic Acid, Venous 4.7 (*)    All other components within normal limits  COMPREHENSIVE METABOLIC PANEL - Abnormal; Notable for the following components:   CO2 17 (*)    Glucose, Bld 173 (*)    BUN 27 (*)    Creatinine, Ser 2.37 (*)    Calcium 8.8 (*)    Total Protein 5.9 (*)    Albumin 2.6 (*)    AST 98 (*)    ALT 69 (*)    GFR calc non Af Amer 20 (*)    GFR calc Af Amer 23 (*)    Anion gap 16 (*)    All other components within normal limits  CBC WITH DIFFERENTIAL/PLATELET - Abnormal; Notable for the following components:   WBC 31.0 (*)    Hemoglobin 11.2 (*)    HCT 35.0 (*)    Neutro Abs 29.1 (*)    Lymphs Abs 0.4 (*)    Abs Immature Granulocytes 0.44 (*)    All other components within normal limits  PROTIME-INR - Abnormal; Notable for the following components:   Prothrombin Time 16.1 (*)    INR 1.3 (*)    All other components within normal limits  URINALYSIS, ROUTINE W REFLEX MICROSCOPIC - Abnormal; Notable for the following components:   Color, Urine AMBER (*)    APPearance CLOUDY (*)    Protein, ur >=300 (*)    Nitrite POSITIVE (*)    Leukocytes,Ua TRACE (*)    WBC, UA >50 (*)    Bacteria, UA MANY (*)    Non Squamous Epithelial 0-5 (*)    All other components within normal limits  CBG MONITORING, ED - Abnormal; Notable for the following components:   Glucose-Capillary 126 (*)    All other components within normal limits  RESPIRATORY PANEL BY RT PCR (FLU A&B, COVID)  CULTURE, BLOOD (ROUTINE X 2)  CULTURE, BLOOD (ROUTINE X 2)  URINE CULTURE  APTT  TSH  CORTISOL  CBC  CREATININE, SERUM  PROCALCITONIN  CBC  MAGNESIUM  PHOSPHORUS  BASIC METABOLIC PANEL  POC SARS CORONAVIRUS 2 AG -  ED  POC SARS CORONAVIRUS 2 AG -  ED    EKG EKG Interpretation  Date/Time:  Monday September 20 2019 15:55:45 EST Ventricular Rate:  129 PR Interval:    QRS Duration: 88 QT Interval:  318 QTC Calculation: 466 R  Axis:   1 Text Interpretation: Atrial fibrillation Borderline repolarization abnormality Since prior ECG, HR has increased, atrial fibrillation in present Confirmed by Gareth Morgan [phone]) on 09/20/2019 7:59:18 PM   Radiology DG Chest Port 1 View  Result Date: 09/20/2019 CLINICAL DATA:  Lethargy, possible sepsis EXAM: PORTABLE CHEST 1 VIEW COMPARISON:  06/29/2019 FINDINGS: Cardiomegaly. Low volume AP portable examination with mild, diffuse bilateral interstitial opacity. The visualized skeletal structures are unremarkable. IMPRESSION: Cardiomegaly. There is mild, diffuse bilateral interstitial opacity, likely mild edema although exaggerated by low volume AP portable technique. No focal airspace opacity. Electronically Signed   By: Eddie Candle M.D.   On: 09/20/2019 16:54    Procedures .Critical Care Performed by: Gareth Morgan, MD Authorized by: Gareth Morgan, MD   Critical care provider statement:    Critical care time (minutes):  70   Critical care was necessary to treat or prevent imminent or life-threatening deterioration of the following conditions:  Sepsis   Critical care was time spent personally by me on the following activities:  Discussions with consultants, evaluation of patient's response to treatment, examination of patient, ordering and performing treatments and interventions, ordering and review of laboratory studies, ordering and review of radiographic studies, pulse oximetry, re-evaluation of patient's condition, obtaining history from patient or surrogate and review of old charts   (including critical care time)  Medications Ordered in ED Medications  cefTRIAXone (ROCEPHIN) 1 g in sodium chloride 0.9 % 100 mL IVPB (0 g Intravenous Stopped 09/20/19 1708)  sodium bicarbonate 150 mEq in dextrose 5% 1000 mL infusion (150 mEq Intravenous New Bag/Given 09/20/19 2321)  albumin human 5 % solution 25 g (has no administration in time range)  hydrocortisone sodium succinate  (SOLU-CORTEF) 100 MG injection 100 mg (100 mg Intravenous Given 09/20/19 2316)  insulin aspart (novoLOG) injection 3-9 Units (has no administration in time range)  heparin injection 5,000 Units (has no administration in time range)  lactated ringers infusion (has no administration in time range)  pantoprazole (PROTONIX) injection 40 mg (has no administration in time range)  0.9 %  sodium chloride infusion (0 mLs Intravenous Hold 09/20/19 2317)  phenylephrine (NEOSYNEPHRINE) 10-0.9 MG/250ML-% infusion (has no administration in time range)  lactated ringers bolus 1,000 mL (0 mLs Intravenous Stopped 09/20/19 1703)    And  lactated ringers bolus 1,000 mL (0 mLs Intravenous Stopped 09/20/19 1704)    And  lactated ringers bolus 1,000 mL (0 mLs Intravenous Stopped 09/20/19 1829)  lactated ringers bolus 1,000 mL (0 mLs Intravenous Stopped 09/20/19 1927)    ED Course  I have reviewed the triage vital signs and the nursing notes.  Pertinent labs & imaging results that were available during my care of the patient were reviewed by me and considered in my medical decision making (see chart for details).    MDM Rules/Calculators/A&P                      74 year old female with a history of COPD, pulmonary hypertension, chronic respiratory failure with hypoxia on 3L pNC that patient reports is secondary  to prior pneumonia, diabetes, hypertension, hyperlipidemia, peripheral artery disease, presents with concern for severe generalized weakness in the setting of recent urinary tract infection.  Presents with pressures of 79/61, with normal oxygenation on home oxygen, and atrial fibrillation with a rate in the 120s.  Suspect sepsis given recent infectious symptoms.  Ordered 30cc/kg LR, rocephin for suspected UTI.   Levophed initially ordered however, BP appeared to improve after given additional liter (total 4.5L with EMS and here), however she again developed systolic in the [id number] and was started on levophed.  Labs  significant for lactic acidosis and acute kidney injury.  COVID testing negative. CXR with likely fluid but history and UA both consistent with UTI and suspect this as source of sepsis. WBC 30000.    Admitted to ICU for continued care of septic shock secondary to urinary source and AKI.  Pt is full code.      Final Clinical Impression(s) / ED Diagnoses Final diagnoses:  Sepsis with acute renal failure and septic shock, due to unspecified organism, unspecified acute renal failure type ([place])  Urinary tract infection without hematuria, site unspecified  AKI (acute kidney injury) ([place])  Lactic acidosis    Rx / DC Orders ED Discharge Orders    None       Gareth Morgan, MD 09/21/19 0023

## 2019-09-21 ENCOUNTER — Inpatient Hospital Stay (HOSPITAL_COMMUNITY): Payer: Medicare Other

## 2019-09-21 ENCOUNTER — Other Ambulatory Visit: Payer: Self-pay

## 2019-09-21 DIAGNOSIS — R6520 Severe sepsis without septic shock: Secondary | ICD-10-CM

## 2019-09-21 DIAGNOSIS — I959 Hypotension, unspecified: Secondary | ICD-10-CM

## 2019-09-21 DIAGNOSIS — N179 Acute kidney failure, unspecified: Secondary | ICD-10-CM | POA: Insufficient documentation

## 2019-09-21 DIAGNOSIS — R6521 Severe sepsis with septic shock: Secondary | ICD-10-CM

## 2019-09-21 HISTORY — DX: Hypotension, unspecified: I95.9

## 2019-09-21 LAB — CBG MONITORING, ED
Glucose-Capillary: 140 mg/dL — ABNORMAL HIGH (ref 70–99)
Glucose-Capillary: 146 mg/dL — ABNORMAL HIGH (ref 70–99)
Glucose-Capillary: 167 mg/dL — ABNORMAL HIGH (ref 70–99)
Glucose-Capillary: 204 mg/dL — ABNORMAL HIGH (ref 70–99)
Glucose-Capillary: 177 mg/dL — ABNORMAL HIGH (ref 70–99)

## 2019-09-21 LAB — CBC
HCT: 30.4 % — ABNORMAL LOW (ref 36.0–46.0)
HCT: 30.7 % — ABNORMAL LOW (ref 36.0–46.0)
Hemoglobin: 10.2 g/dL — ABNORMAL LOW (ref 12.0–15.0)
Hemoglobin: 10.2 g/dL — ABNORMAL LOW (ref 12.0–15.0)
MCH: 29.7 pg (ref 26.0–34.0)
MCH: 29.2 pg (ref 26.0–34.0)
MCHC: 33.6 g/dL (ref 30.0–36.0)
MCHC: 33.2 g/dL (ref 30.0–36.0)
MCV: 88.6 fL (ref 80.0–100.0)
MCV: 88 fL (ref 80.0–100.0)
Platelets: 262 10*3/uL (ref 150–400)
Platelets: 272 10*3/uL (ref 150–400)
RBC: 3.43 MIL/uL — ABNORMAL LOW (ref 3.87–5.11)
RBC: 3.49 MIL/uL — ABNORMAL LOW (ref 3.87–5.11)
RDW: 14.2 % (ref 11.5–15.5)
RDW: 14.2 % (ref 11.5–15.5)
WBC: 32.9 10*3/uL — ABNORMAL HIGH (ref 4.0–10.5)
WBC: 30.7 10*3/uL — ABNORMAL HIGH (ref 4.0–10.5)
nRBC: 0 % (ref 0.0–0.2)
nRBC: 0 % (ref 0.0–0.2)

## 2019-09-21 LAB — BASIC METABOLIC PANEL
Anion gap: 12 (ref 5–15)
BUN: 30 mg/dL — ABNORMAL HIGH (ref 8–23)
CO2: 20 mmol/L — ABNORMAL LOW (ref 22–32)
Calcium: 8.1 mg/dL — ABNORMAL LOW (ref 8.9–10.3)
Chloride: 107 mmol/L (ref 98–111)
Creatinine, Ser: 1.72 mg/dL — ABNORMAL HIGH (ref 0.44–1.00)
GFR calc Af Amer: 34 mL/min — ABNORMAL LOW (ref >60)
GFR calc non Af Amer: 29 mL/min — ABNORMAL LOW (ref >60)
Glucose, Bld: 159 mg/dL — ABNORMAL HIGH (ref 70–99)
Potassium: 4.5 mmol/L (ref 3.5–5.1)
Sodium: 139 mmol/L (ref 135–145)

## 2019-09-21 LAB — HEPATITIS PANEL, ACUTE
HCV Ab: NONREACTIVE
Hep A IgM: NONREACTIVE
Hep B C IgM: NONREACTIVE
Hepatitis B Surface Ag: NONREACTIVE

## 2019-09-21 LAB — PROTEIN / CREATININE RATIO, URINE
Creatinine, Urine: 55.97 mg/dL
Protein Creatinine Ratio: 0.5 mg/mg{Cre} — ABNORMAL HIGH (ref 0.00–0.15)
Total Protein, Urine: 28 mg/dL

## 2019-09-21 LAB — MAGNESIUM: Magnesium: 0.9 mg/dL — CL (ref 1.7–2.4)

## 2019-09-21 LAB — COMPREHENSIVE METABOLIC PANEL WITH GFR
ALT: 44 U/L (ref 0–44)
AST: 37 U/L (ref 15–41)
Albumin: 2.9 g/dL — ABNORMAL LOW (ref 3.5–5.0)
Alkaline Phosphatase: 86 U/L (ref 38–126)
Anion gap: 13 (ref 5–15)
BUN: 32 mg/dL — ABNORMAL HIGH (ref 8–23)
CO2: 25 mmol/L (ref 22–32)
Calcium: 8.2 mg/dL — ABNORMAL LOW (ref 8.9–10.3)
Chloride: 102 mmol/L (ref 98–111)
Creatinine, Ser: 1.37 mg/dL — ABNORMAL HIGH (ref 0.44–1.00)
GFR calc Af Amer: 44 mL/min — ABNORMAL LOW
GFR calc non Af Amer: 38 mL/min — ABNORMAL LOW
Glucose, Bld: 218 mg/dL — ABNORMAL HIGH (ref 70–99)
Potassium: 4.2 mmol/L (ref 3.5–5.1)
Sodium: 140 mmol/L (ref 135–145)
Total Bilirubin: 0.8 mg/dL (ref 0.3–1.2)
Total Protein: 5.9 g/dL — ABNORMAL LOW (ref 6.5–8.1)

## 2019-09-21 LAB — TROPONIN I (HIGH SENSITIVITY)
Troponin I (High Sensitivity): 759 ng/L (ref <18)
Troponin I (High Sensitivity): 679 ng/L (ref <18)

## 2019-09-21 LAB — CREATININE, SERUM
Creatinine, Ser: 1.63 mg/dL — ABNORMAL HIGH (ref 0.44–1.00)
GFR calc Af Amer: 36 mL/min — ABNORMAL LOW (ref >60)
GFR calc non Af Amer: 31 mL/min — ABNORMAL LOW (ref >60)

## 2019-09-21 LAB — CK TOTAL AND CKMB (NOT AT ARMC)
CK, MB: 9.2 ng/mL — ABNORMAL HIGH (ref 0.5–5.0)
Relative Index: INVALID (ref 0.0–2.5)
Total CK: 36 U/L — ABNORMAL LOW (ref 38–234)

## 2019-09-21 LAB — PHOSPHORUS: Phosphorus: 4.5 mg/dL (ref 2.5–4.6)

## 2019-09-21 LAB — SODIUM, URINE, RANDOM: Sodium, Ur: 43 mmol/L

## 2019-09-21 LAB — CORTISOL: Cortisol, Plasma: 32.6 ug/dL

## 2019-09-21 LAB — BRAIN NATRIURETIC PEPTIDE: B Natriuretic Peptide: 457.2 pg/mL — ABNORMAL HIGH (ref 0.0–100.0)

## 2019-09-21 LAB — GLUCOSE, CAPILLARY: Glucose-Capillary: 229 mg/dL — ABNORMAL HIGH (ref 70–99)

## 2019-09-21 LAB — PROCALCITONIN: Procalcitonin: 34.95 ng/mL

## 2019-09-21 LAB — TSH: TSH: 1.157 u[IU]/mL (ref 0.350–4.500)

## 2019-09-21 MED ORDER — DILTIAZEM HCL 30 MG PO TABS
30.0000 mg | ORAL_TABLET | Freq: Once | ORAL | Status: AC
  Administered 2019-09-21: 30 mg via ORAL
  Filled 2019-09-21: qty 1

## 2019-09-21 MED ORDER — GABAPENTIN 100 MG PO CAPS
100.0000 mg | ORAL_CAPSULE | Freq: Three times a day (TID) | ORAL | Status: DC
  Administered 2019-09-21 – 2019-09-24 (×10): 100 mg via ORAL
  Filled 2019-09-21 (×11): qty 1

## 2019-09-21 MED ORDER — INSULIN ASPART 100 UNIT/ML ~~LOC~~ SOLN
0.0000 [IU] | Freq: Three times a day (TID) | SUBCUTANEOUS | Status: DC
  Administered 2019-09-21: 17:00:00 3 [IU] via SUBCUTANEOUS
  Administered 2019-09-21: 21:00:00 2 [IU] via SUBCUTANEOUS
  Administered 2019-09-21: 1 [IU] via SUBCUTANEOUS
  Administered 2019-09-22 (×2): 5 [IU] via SUBCUTANEOUS
  Administered 2019-09-22 – 2019-09-23 (×2): 2 [IU] via SUBCUTANEOUS
  Administered 2019-09-23 (×2): 5 [IU] via SUBCUTANEOUS
  Administered 2019-09-24: 2 [IU] via SUBCUTANEOUS

## 2019-09-21 MED ORDER — INSULIN ASPART 100 UNIT/ML ~~LOC~~ SOLN
0.0000 [IU] | Freq: Every day | SUBCUTANEOUS | Status: DC
  Administered 2019-09-21: 22:00:00 2 [IU] via SUBCUTANEOUS
  Administered 2019-09-22: 22:00:00 5 [IU] via SUBCUTANEOUS
  Administered 2019-09-23: 23:00:00 2 [IU] via SUBCUTANEOUS

## 2019-09-21 MED ORDER — SODIUM BICARBONATE-DEXTROSE 150-5 MEQ/L-% IV SOLN
150.0000 meq | INTRAVENOUS | Status: DC
  Administered 2019-09-21 (×2): 150 meq via INTRAVENOUS
  Filled 2019-09-21 (×2): qty 1000

## 2019-09-21 MED ORDER — LORATADINE 10 MG PO TABS
10.0000 mg | ORAL_TABLET | Freq: Every day | ORAL | Status: DC
  Administered 2019-09-21 – 2019-09-24 (×4): 10 mg via ORAL
  Filled 2019-09-21 (×4): qty 1

## 2019-09-21 MED ORDER — APIXABAN 5 MG PO TABS
5.0000 mg | ORAL_TABLET | Freq: Two times a day (BID) | ORAL | Status: DC
  Administered 2019-09-21 – 2019-09-24 (×7): 5 mg via ORAL
  Filled 2019-09-21 (×8): qty 1

## 2019-09-21 MED ORDER — PRO-STAT SUGAR FREE PO LIQD
30.0000 mL | Freq: Two times a day (BID) | ORAL | Status: DC
  Administered 2019-09-21 – 2019-09-22 (×3): 30 mL via ORAL
  Filled 2019-09-21 (×4): qty 30

## 2019-09-21 NOTE — ED Notes (Signed)
ICU  Breakfast ordered 

## 2019-09-21 NOTE — Progress Notes (Signed)
ANTICOAGULATION CONSULT NOTE - Initial Consult  Pharmacy Consult for Apixaban  Indication: atrial fibrillation  Allergies  Allergen Reactions  . Meloxicam Other (See Comments)    Causes excess Fluid buildup    Vital Signs: BP: 106/75 (01/05 0515) Pulse Rate: 103 (01/05 0445)  Labs: Recent Labs    09/20/19 1621 09/21/19 0204  HGB 11.2* 10.2*  HCT 35.0* 30.4*  PLT 314 262  APTT 36  --   LABPROT 16.1*  --   INR 1.3*  --   CREATININE 2.37* 1.72*  TROPONINIHS  --  759*    CrCl cannot be calculated (Unknown ideal weight.).   Medical History: Past Medical History:  Diagnosis Date  . Adenomatous polyp 12/04/2006  . Asthma   . COPD (chronic obstructive pulmonary disease) ([place])   . DM type 2 (diabetes mellitus, type 2) ([place])   . Hemorrhoid 12/04/2006  . Hyperlipidemia   . Hypertension   . Peripheral arterial disease [facility])     Assessment: 74 y/o F with Afib/RVR, states history of this but not on any anti-coagulation PTA, to being Apixaban, noted renal dysfunction but still meets criteria for full dose.  Goal of Therapy:  Monitor platelets by anticoagulation protocol: Yes   Plan:  Apixaban 5 mg BID Daily CBC while inpatient  Monitor for bleeding  Narda Bonds, PharmD, BCPS Clinical Pharmacist Phone: [phone]

## 2019-09-21 NOTE — Progress Notes (Signed)
PCCM  Spoke to Dr Maudie Mercury with [facility] off pressors and hemodynamically stable  [facility] will accept   .Marland KitchenSigned Dr Seward Carol Pulmonary Critical Care Locums

## 2019-09-21 NOTE — Progress Notes (Signed)
PROGRESS NOTE    Debbie [name]  [id number] DOB: 04-Feb-1946 DOA: 09/20/2019 PCP: [name], MD Outpatient Specialists:    Brief Narrative:   History of A. Fib (was not on anticoagulation at home, Eliquis was started here), COPD, pulmonary hypertension, type 2 diabetes.  On O2 at [address] L [place] at home (states due to previous pneumonia?).   Admitted 09/20/2019 by critical care: Presented to ED with complaints of 1 day of increased lethargy, per EMS recent UTI with just finished 10-day course of nitrofurantoin from PCP 3 days prior. Was hypotensive tachypneic, elevated WBC, elevated creatinine, positive urine leukocytes and nitrites.  A. fib on EKG.   Was on Levophed 2 mcg gtt., critical care was consulted, discontinued this.  Anion gap metabolic acidosis.  Critical care recommended stay on bicarb drip until AG closes, lactic acidosis/Metformin use likely cause.  Advised albumin and hydrocortisone, hold ARB, check TSH, mag, phos.  IV fluids tapered down given questionable congestion on chest x-ray findings. PCCM weaned off pressors and pt appears stable, was transitioned to [facility] 09/21/19   Today 09/21/19:   still in ER holding. Patient feeling better than she did on admission but still c/o weakness.   Pending:   Clinical improvement  blood and urine culture results  Renal ultrasound  RUQ ultrasound  Echocardiogram   Dispo: here today   Assessment & Plan:   Principal Problem:   Sepsis ([place]) Active Problems:   Acute lower UTI   Atrial fibrillation with RVR (HCC)   Septic shock (HCC)   Hypotension   Sepsis secondary to Acute lower UTI Acute Lower UTI  Blood culture x2  Urine culture  Cont Rocephin 1gm iv qday  Hypotension  Tele  Trop I q2h x2 759 -->679  Check cardiac echo, pending    STOP Losartan, STOP Nifedipine for now, resume as bp improves  Unclear if we can continue fluids in light of CXR findings. Backed off a bit down from 167mL to 50mL per  hour and ordered CXR in AM, watch BP  ARF  STOP Losartan  Check urine sodium, urine creatinine, urine eosinophils  Check renal ultrasound  Pt has been hydrated  Check cmp in am  AG acidosis, Lactic acidosis  Likely secondary to metformin in setting of ARF  STOP Metformin  On bicarb, will recheck CMP   Afib with RVR  Tele  Trop I q2h x2; 759 -->679  Check tsh: WNL  Check cardiac echo: pending  Consider cardizem if hr not improving with improvement in sepsis  Pt states that she has hx of Afib  In the past, unclear why not on anticoagulation.    Started Eliquis pharmacy to dose  Dm2  STOP Janumet XR as above  fsbs ac and qhs, ISS  Abnormal liver function  acute hepatitis panel NR  cpk low (total CK)  RUQ ultrasound: potential cirrhosis   Check cmp again later today   Severe protein calorie malnutrition  Prostat 30 mL po bid        DVT prophylaxis: Heparin -> eliquis.   SCD  Code Status: FULL CODE per patient Family Communication: Admission, patients condition and plan of care including tests being ordered have been discussed with the patient  who indicate understanding and agree with the plan and Code Status. I called daughter Chinita Pester [phone] was updated and all questions answered, grateful we are caring for her mom  Disposition Plan: here for now!    Consultants:   PCCM  Procedures:  none  Antimicrobials:  Anti-infectives (From admission, onward)   Start     Dose/Rate Route Frequency Ordered Stop   09/20/19 1700  cefTRIAXone (ROCEPHIN) 1 g in sodium chloride 0.9 % 100 mL IVPB     1 g 200 mL/hr over 30 Minutes Intravenous Every 24 hours 09/20/19 1622         Subjective: Patient reports improvement as above   Objective: Vitals:   09/21/19 1000 09/21/19 1100 09/21/19 1200 09/21/19 1230  BP: 126/85 112/68 110/64 115/63  Pulse: 97 (!) 112    Resp: 13 19  19   Temp:      TempSrc:      SpO2: 96% 95% 95%      Intake/Output Summary (Last 24 hours) at 09/21/2019 1401 Last data filed at 09/20/2019 1927 Gross per 24 hour  Intake 4100 ml  Output --  Net 4100 ml   There were no vitals filed for this visit.  Examination:  General exam: Appears calm and comfortable  Respiratory system: Clear to auscultation. Respiratory effort normal. Cardiovascular system: S1 & S2 heard, RRR. No JVD, murmurs, rubs, gallops or clicks. No pedal edema. Gastrointestinal system: Abdomen is nondistended, soft and nontender. No organomegaly or masses felt. Normal bowel sounds heard. Central nervous system: Alert and oriented. No focal neurological deficits. Extremities: Symmetric 5 x 5 power. R BKA.  Skin: No rashes, lesions or ulcers Psychiatry: Judgement and insight appear normal. Mood & affect appropriate.     Data Reviewed: I have personally reviewed following labs and imaging studies  CBC: Recent Labs  Lab 09/20/19 1621 09/21/19 0204 09/21/19 0528  WBC 31.0* 32.9* 30.7*  NEUTROABS 29.1*  --   --   HGB 11.2* 10.2* 10.2*  HCT 35.0* 30.4* 30.7*  MCV 90.2 88.6 88.0  PLT 314 262 [id number]   Basic Metabolic Panel: Recent Labs  Lab 09/20/19 1621 09/21/19 0204 09/21/19 0528  NA 137 139  --   K 4.0 4.5  --   CL 104 107  --   CO2 17* 20*  --   GLUCOSE 173* 159*  --   BUN 27* 30*  --   CREATININE 2.37* 1.72* 1.63*  CALCIUM 8.8* 8.1*  --   MG  --  0.9*  --   PHOS  --  4.5  --    GFR: CrCl cannot be calculated (Unknown ideal weight.). Liver Function Tests: Recent Labs  Lab 09/20/19 1621  AST 98*  ALT 69*  ALKPHOS 83  BILITOT 1.1  PROT 5.9*  ALBUMIN 2.6*   No results for input(s): LIPASE, AMYLASE in the last 168 hours. No results for input(s): AMMONIA in the last 168 hours. Coagulation Profile: Recent Labs  Lab 09/20/19 1621  INR 1.3*   Cardiac Enzymes: Recent Labs  Lab 09/21/19 0528  CKTOTAL 36*  CKMB 9.2*   BNP (last 3 results) No results for input(s): PROBNP in the last 8760  hours. HbA1C: No results for input(s): HGBA1C in the last 72 hours. CBG: Recent Labs  Lab 09/20/19 2242 09/21/19 0308 09/21/19 0737 09/21/19 1224  GLUCAP 126* 140* 146* 167*   Lipid Profile: No results for input(s): CHOL, HDL, LDLCALC, TRIG, CHOLHDL, LDLDIRECT in the last 72 hours. Thyroid Function Tests: Recent Labs    09/20/19 2317  TSH 1.157   Anemia Panel: No results for input(s): VITAMINB12, FOLATE, FERRITIN, TIBC, IRON, RETICCTPCT in the last 72 hours. Urine analysis:    Component Value Date/Time   COLORURINE AMBER (A) 09/20/2019 1840  APPEARANCEUR CLOUDY (A) 09/20/2019 1840   LABSPEC 1.018 09/20/2019 1840   PHURINE 5.0 09/20/2019 1840   GLUCOSEU NEGATIVE 09/20/2019 1840   HGBUR NEGATIVE 09/20/2019 [place] NEGATIVE 09/20/2019 1840   KETONESUR NEGATIVE 09/20/2019 1840   PROTEINUR >=300 (A) 09/20/2019 1840   NITRITE POSITIVE (A) 09/20/2019 1840   LEUKOCYTESUR TRACE (A) 09/20/2019 1840   Sepsis Labs: @LABRCNTIP (procalcitonin:4,lacticidven:4)  Recent Results (from the past 240 hour(s))  Blood Culture (routine x 2)     Status: None (Preliminary result)   Collection Time: 09/20/19  4:05 PM   Specimen: BLOOD  Result Value Ref Range Status   Specimen Description BLOOD LEFT ANTECUBITAL  Final   Special Requests   Final    BOTTLES DRAWN AEROBIC AND ANAEROBIC Blood Culture results may not be optimal due to an inadequate volume of blood received in culture bottles   Culture   Final    NO GROWTH < 24 HOURS Performed at [facility], [place] [address], [place], [place] [address]    Report Status PENDING  Incomplete  Blood Culture (routine x 2)     Status: None (Preliminary result)   Collection Time: 09/20/19  4:18 PM   Specimen: BLOOD LEFT FOREARM  Result Value Ref Range Status   Specimen Description BLOOD LEFT FOREARM  Final   Special Requests   Final    BOTTLES DRAWN AEROBIC AND ANAEROBIC Blood Culture results may not be optimal due to an inadequate  volume of blood received in culture bottles   Culture   Final    NO GROWTH < 24 HOURS Performed at [facility], [place] [address] [place], [place] [address]    Report Status PENDING  Incomplete  Respiratory Panel by RT PCR (Flu A&B, Covid) - Nasopharyngeal Swab     Status: None   Collection Time: 09/20/19  5:50 PM   Specimen: Nasopharyngeal Swab  Result Value Ref Range Status   SARS Coronavirus 2 by RT PCR NEGATIVE NEGATIVE Final    Comment: (NOTE) SARS-CoV-2 target nucleic acids are NOT DETECTED. The SARS-CoV-2 RNA is generally detectable in upper respiratoy specimens during the acute phase of infection. The lowest concentration of SARS-CoV-2 viral copies this assay can detect is 131 copies/mL. A negative result does not preclude SARS-Cov-2 infection and should not be used as the sole basis for treatment or other patient management decisions. A negative result may occur with  improper specimen collection/handling, submission of specimen other than nasopharyngeal swab, presence of viral mutation(s) within the areas targeted by this assay, and inadequate number of viral copies (<131 copies/mL). A negative result must be combined with clinical observations, patient history, and epidemiological information. The expected result is Negative. Fact Sheet for Patients:  PinkCheek.be Fact Sheet for Healthcare Providers:  GravelBags.it This test is not yet ap proved or cleared by the [place] FDA and  has been authorized for detection and/or diagnosis of SARS-CoV-2 by FDA under an Emergency Use Authorization (EUA). This EUA will remain  in effect (meaning this test can be used) for the duration of the COVID-19 declaration under Section 564(b)(1) of the Act, 21 U.S.C. section 360bbb-3(b)(1), unless the authorization is terminated or revoked sooner.    Influenza A by PCR NEGATIVE NEGATIVE Final   Influenza B by PCR NEGATIVE  NEGATIVE Final    Comment: (NOTE) The Xpert Xpress SARS-CoV-2/FLU/RSV assay is intended as an aid in  the diagnosis of influenza from Nasopharyngeal swab specimens and  should not be used  as a sole basis for treatment. Nasal washings and  aspirates are unacceptable for Xpert Xpress SARS-CoV-2/FLU/RSV  testing. Fact Sheet for Patients: PinkCheek.be Fact Sheet for Healthcare Providers: GravelBags.it This test is not yet approved or cleared by the [place] FDA and  has been authorized for detection and/or diagnosis of SARS-CoV-2 by  FDA under an Emergency Use Authorization (EUA). This EUA will remain  in effect (meaning this test can be used) for the duration of the  Covid-19 declaration under Section 564(b)(1) of the Act, 21  U.S.C. section 360bbb-3(b)(1), unless the authorization is  terminated or revoked. Performed at [facility], [place] [address], [place], [place] [address]          Radiology Studies last 96 hours: US Abdomen Complete  Result Date: 09/21/2019 CLINICAL DATA:  Elevated LFTs, BUN and creatinine, history of cholecystectomy EXAM: ABDOMEN ULTRASOUND COMPLETE COMPARISON:  PET-CT 10/18/2010 FINDINGS: Gallbladder: Surgically absent Common bile duct: Diameter: 7.9 mm proximally, 8.4 mm distally, likely within normal limits for patient age and post cholecystectomy state. Liver: Parenchymal echogenicity is within normal limits. Slightly nodular hepatic surface contour. No focal liver lesions are identified. Portal vein is patent on color Doppler imaging with normal direction of blood flow towards the liver. IVC: No abnormality visualized. Pancreas: Visualized portion unremarkable. Portions of the tail and body are obscured by bowel gas. Spleen: Size and appearance within normal limits. Right Kidney: 11.4 x 5.3 x 5.4 cm (volume 170 mL). Echogenicity within normal limits. No mass or hydronephrosis visualized. Left  Kidney: 11.0 x 6.1 x 5.4 cm (volume 190 mL). Echogenicity within normal limits. No mass or hydronephrosis visualized. Abdominal aorta: No proximal aneurysm identified. Mid to distal aorta is poorly visualized due to bowel gas. Other findings: None. IMPRESSION: Patient is post cholecystectomy. Prominence of the biliary tree may be a combination of senescent change and post cholecystectomy reservoir effect. Slightly nodular hepatic surface contour, can be seen in the setting of intrinsic liver disease including potential cirrhosis. Correlate with serologies. Electronically Signed   By: [name] M.D.   On: 09/21/2019 03:18   DG Chest Port 1 View  Result Date: 09/20/2019 CLINICAL DATA:  Lethargy, possible sepsis EXAM: PORTABLE CHEST 1 VIEW COMPARISON:  06/29/2019 FINDINGS: Cardiomegaly. Low volume AP portable examination with mild, diffuse bilateral interstitial opacity. The visualized skeletal structures are unremarkable. IMPRESSION: Cardiomegaly. There is mild, diffuse bilateral interstitial opacity, likely mild edema although exaggerated by low volume AP portable technique. No focal airspace opacity. Electronically Signed   By: Eddie Candle M.D.   On: 09/20/2019 16:54         Scheduled Meds: . apixaban  5 mg Oral BID  . feeding supplement (PRO-STAT SUGAR FREE 64)  30 mL Oral BID  . gabapentin  100 mg Oral TID  . hydrocortisone sod succinate (SOLU-CORTEF) inj  100 mg Intravenous Q8H  . insulin aspart  0-5 Units Subcutaneous QHS  . insulin aspart  0-9 Units Subcutaneous TID WC  . loratadine  10 mg Oral Daily  . pantoprazole (PROTONIX) IV  40 mg Intravenous QHS   Continuous Infusions: . sodium chloride Stopped (09/20/19 2317)  . cefTRIAXone (ROCEPHIN)  IV Stopped (09/20/19 1708)  . sodium bicarbonate 150 mEq in dextrose 5% 1000 mL 150 mEq (09/21/19 0309)     LOS: 1 day    Time spent: [facility]  Pager [phone]  or secure message in [place]

## 2019-09-21 NOTE — ED Notes (Signed)
Debbie [name] daughter [id number] looking for an update

## 2019-09-21 NOTE — ED Notes (Signed)
Lunch Tray Ordered @ 1132.  

## 2019-09-21 NOTE — ED Notes (Signed)
Patient given lunch tray.

## 2019-09-21 NOTE — Discharge Instructions (Signed)

## 2019-09-21 NOTE — H&P (Signed)
TRH H&P    Patient Demographics:    Debbie [name], is a 74 y.o. female  MRN: [id number]  DOB - June 29, 1946  Admit Date - 09/20/2019  Referring MD/NP/PA:  Seward Carol  Outpatient Primary MD for the patient is [name], MD  Patient coming from:  home  Chief complaint-   Confusion, Hypotension, Sepsis   HPI:    Debbie [name]  is a 74 y.o. female,  w hypertension, hyperlipidemia, Dm2, PAD, Copd/ Asthma, w recent UTI tx with oral abx. Apparently presents with confusion.    In Ed,  T 99.5,  P 128, R 25, Bp 79/61  Pox 98% on3L [place]  Na 137, K 4.0, Bun 27, Creatinine 2.37  Ast 98, Alt 69, Alk phos 83, T. Bili 1.1 Wbc 31.0, Hgb 11.2, Plt 314 INR 1.3 Lactic acid 5.0  Sars coronavirus  Negative  CXR ->  IMPRESSION: Cardiomegaly. There is mild, diffuse bilateral interstitial opacity, likely mild edema although exaggerated by low volume AP portable technique. No focal airspace opacity.   Pt admitted to PCCM Pt was given 4L of LR and started on levophed. Pt also started on solucortef 178m iv q8h, and continued on LR at 712mper hour as well as sodium bicarb at 7558mer hour.    Per PCCM patient weaned off pressors and appears stable and they request transfer to [facility] and have placed for a  cardiac tele ?      Review of systems:    In addition to the HPI above,  No Fever-chills, No Headache, No changes with Vision or hearing, No problems swallowing food or Liquids, No Chest pain, Cough or Shortness of Breath, No Abdominal pain, No Nausea or Vomiting, bowel movements are regular, No Blood in stool or Urine, No dysuria, No new skin rashes or bruises, No new joints pains-aches,  No new weakness, tingling, numbness in any extremity, No recent weight gain or loss, No polyuria, polydypsia or polyphagia, No significant Mental Stressors.  All other systems reviewed and are  negative.    Past History of the following :    Past Medical History:  Diagnosis Date  . Adenomatous polyp 12/04/2006  . Asthma   . COPD (chronic obstructive pulmonary disease) ([facility] . DM type 2 (diabetes mellitus, type 2) ([facility] . Hemorrhoid 12/04/2006  . Hyperlipidemia   . Hypertension   . [facility]     Past Surgical History:  Procedure Laterality Date  . CHOLECYSTECTOMY    . COLONOSCOPY  12/03/2006   Dr. RouDelight Ovensdenomatous polyp  . COLONOSCOPY  03/25/2012   Procedure: COLONOSCOPY;  Surgeon: RobDaneil DolinD;  [facility];  Service: Endoscopy;  Laterality: N/A;  10:30  . ESOPHAGOGASTRODUODENOSCOPY  11/03/2002   Dr. RouGala Romneyormal exam- was done to check for possible foreign body  . Fiberoptic bronchoscopy with endobronchial  ultrasound  10/22/2010   [place]  . Right BKA  1990  . RIGHT HEART CATHETERIZATION N/A 06/22/2014   Procedure: RIGHT HEART CATH;  Surgeon: Larey Dresser, MD;  Location: [facility] CATH LAB;  Service: Cardiovascular;  Laterality: N/A;      Social History:      Social History   Tobacco Use  . Smoking status: Former Smoker    Packs/day: 0.50    Years: 18.00    Pack years: 9.00    Types: Cigarettes    Quit date: 09/16/1990    Years since quitting: 29.0  . Smokeless tobacco: Never Used  Substance Use Topics  . Alcohol use: No    Alcohol/week: 0.0 standard drinks       Family History :     Family History  Problem Relation Age of Onset  . COPD Sister   . Diabetes Sister   . Breast cancer Sister        Mastectomy  . CVA Mother 44  . Heart disease Mother   . Diabetes Mother 59  . Lung cancer Father        lung carcinoma  . Colon cancer Neg Hx        Home Medications:   Prior to Admission medications   Medication Sig Start Date End Date Taking? Authorizing Provider  aspirin EC 81 MG tablet Take 81 mg by mouth daily.      [provider]  BD PEN NEEDLE NANO U/F 32G X 4 MM MISC 2  (two) times daily. as directed 02/26/15   [provider]  Calcium Carb-Cholecalciferol (CALCIUM 1000 + D PO) Take 1,000 mg by mouth daily.    [provider]  cetirizine (ZYRTEC) 10 MG tablet Take 10 mg by mouth daily.    [provider]  Coenzyme Q10 (CO Q 10 PO) Take 300 mg by mouth daily.    [provider]  dexlansoprazole (DEXILANT) 60 MG capsule Take 1 capsule (60 mg total) by mouth daily. 09/29/15   Tanda Rockers, MD  fluticasone (FLONASE) 50 MCG/ACT nasal spray Place 1 spray into the nose daily.    [provider]  gabapentin (NEURONTIN) 100 MG capsule Take 100 mg by mouth 3 (three) times daily.    [provider]  halobetasol (ULTRAVATE) 0.05 % cream  08/15/16   [provider]  ibuprofen (ADVIL,MOTRIN) 200 MG tablet Take 600 mg by mouth every 6 (six) hours as needed. For pain    [provider]  Insulin Glargine (BASAGLAR KWIKPEN) 100 UNIT/ML SOPN Inject 50 Units into the skin daily.    [provider]  Insulin Pen Needle (BD PEN NEEDLE NANO U/F) 32G X 4 MM MISC USE TWICE DAILY AS DIRECTED 05/31/15   [provider]  losartan (COZAAR) 100 MG tablet Take 100 mg by mouth daily. 05/31/19   [provider]  NIFEdipine (PROCARDIA XL/ADALAT-CC) 30 MG 24 hr tablet Take 30 mg by mouth 2 (two) times daily.  06/07/11   [provider]  OXYGEN 3 lpm with sleep and exertion  APS    [provider]  Probiotic Product (PROBIOTIC ADVANCED PO) Take by mouth daily.    [provider]  SitaGLIPtin-MetFORMIN HCl (JANUMET XR) 50-1000 MG TB24 Take 1 tablet by mouth 2 (two) times daily.  01/10/14   [provider]  VOLTAREN 1 % GEL APPLY 2 GM TO THE AFFECTED JOINT 4 TIMES DAILY AS NEEDED. 08/13/17   [provider]     Allergies:     Allergies  Allergen Reactions  . Meloxicam Other (See Comments)    Causes excess Fluid buildup  Physical Exam:    Vitals  Blood pressure 104/80, pulse (!) 111, temperature 99.5 F (37.5 C), temperature source Oral, resp. rate (!) 25, SpO2 (!) 89 %.  1.  General: axoxo3  2. Psychiatric: euthymic  3. Neurologic: cn2-12 intact, reflexes 2+ symmetrid, diffuse with no clonus, motor  5/5 in all 4 ext  4. HEENMT:  Anicteric, pupils 1.57m symmetric, direct, consensual, near intact Neck: no jvd  5. Respiratory : Slight crackles left lung base, no wheezing,   6. Cardiovascular  Irr, irr, s1, s2, no m/g/r  7. Gastrointestinal:  Abd: soft, nt, nd, +bs  8. Skin:  Ext: no c/c/e, no rash  9.Musculoskeletal:  Good ROM    Data Review:    CBC Recent Labs  Lab 09/20/19 1621  WBC 31.0*  HGB 11.2*  HCT 35.0*  PLT 314  MCV 90.2  MCH 28.9  MCHC 32.0  RDW 13.9  LYMPHSABS 0.4*  MONOABS 1.0  EOSABS 0.0  BASOSABS 0.1   ------------------------------------------------------------------------------------------------------------------  Results for orders placed or performed during the hospital encounter of 09/20/19 (from the past 48 hour(s))  Comprehensive metabolic panel     Status: Abnormal   Collection Time: 09/20/19  4:21 PM  Result Value Ref Range   Sodium 137 135 - 145 mmol/L   Potassium 4.0 3.5 - 5.1 mmol/L   Chloride 104 98 - 111 mmol/L   CO2 17 (L) 22 - 32 mmol/L   Glucose, Bld 173 (H) 70 - 99 mg/dL   BUN 27 (H) 8 - 23 mg/dL   Creatinine, Ser 2.37 (H) 0.44 - 1.00 mg/dL   Calcium 8.8 (L) 8.9 - 10.3 mg/dL   Total Protein 5.9 (L) 6.5 - 8.1 g/dL   Albumin 2.6 (L) 3.5 - 5.0 g/dL   AST 98 (H) 15 - 41 U/L   ALT 69 (H) 0 - 44 U/L   Alkaline Phosphatase 83 38 - 126 U/L   Total Bilirubin 1.1 0.3 - 1.2 mg/dL   GFR calc non Af Amer 20 (L) >60 mL/min   GFR calc Af Amer 23 (L) >60 mL/min   Anion gap 16 (H) 5 - 15    Comment: [facility] [address], [place] [address] CBC WITH DIFFERENTIAL     Status: Abnormal   Collection Time: 09/20/19  4:21 PM   Result Value Ref Range   WBC 31.0 (H) 4.0 - 10.5 K/uL   RBC 3.88 3.87 - 5.11 MIL/uL   Hemoglobin 11.2 (L) 12.0 - 15.0 g/dL   HCT 35.0 (L) 36.0 - 46.0 %   MCV 90.2 80.0 - 100.0 fL   MCH 28.9 26.0 - 34.0 pg   MCHC 32.0 30.0 - 36.0 g/dL   RDW 13.9 11.5 - 15.5 %   Platelets 314 150 - 400 K/uL   nRBC 0.0 0.0 - 0.2 %   Neutrophils Relative % 95 %   Neutro Abs 29.1 (H) 1.7 - 7.7 K/uL   Lymphocytes Relative 1 %   Lymphs Abs 0.4 (L) 0.7 - 4.0 K/uL   Monocytes Relative 3 %   Monocytes Absolute 1.0 0.1 - 1.0 K/uL   Eosinophils Relative 0 %   Eosinophils Absolute 0.0 0.0 - 0.5 K/uL   Basophils Relative 0 %   Basophils Absolute 0.1 0.0 - 0.1 K/uL   WBC Morphology INCREASED BANDS (>20% BANDS)    Immature Granulocytes 1 %   Abs Immature Granulocytes 0.44 (H) 0.00 - 0.07 K/uL    Comment:  Performed at [facility], [place] [address], [place], [place] [address]  APTT     Status: None   Collection Time: 09/20/19  4:21 PM  Result Value Ref Range   aPTT 36 24 - 36 seconds    Comment: Performed at [facility] [address], [place], [place] [address]  Protime-INR     Status: Abnormal   Collection Time: 09/20/19  4:21 PM  Result Value Ref Range   Prothrombin Time 16.1 (H) 11.4 - 15.2 seconds   INR 1.3 (H) 0.8 - 1.2    Comment: (NOTE) INR goal varies based on device and disease states. Performed at [facility], [place] [address], [place], [place] [address]   Lactic acid, plasma     Status: Abnormal   Collection Time: 09/20/19  4:30 PM  Result Value Ref Range   Lactic Acid, Venous 5.0 (HH) 0.5 - 1.9 mmol/L    Comment: CRITICAL RESULT CALLED TO, READ BACK BY AND VERIFIED WITH: Neysa Bonito 1710 09/20/2019 D BRADLEY Performed at [facility], [place] [address], [place], [place] [address]   POC SARS Coronavirus 2 Ag-ED - Nasal Swab (BD Veritor Kit)     Status: None   Collection Time: 09/20/19  5:02 PM  Result Value Ref Range   SARS Coronavirus 2 Ag NEGATIVE NEGATIVE     Comment: (NOTE) SARS-CoV-2 antigen NOT DETECTED.  Negative results are presumptive.  Negative results do not preclude SARS-CoV-2 infection and should not be used as the sole basis for treatment or other patient management decisions, including infection  control decisions, particularly in the presence of clinical signs and  symptoms consistent with COVID-19, or in those who have been in contact with the virus.  Negative results must be combined with clinical observations, patient history, and epidemiological information. The expected result is Negative. Fact Sheet for Patients: PodPark.tn Fact Sheet for Healthcare Providers: GiftContent.is This test is not yet approved or cleared by the [place] FDA and  has been authorized for detection and/or diagnosis of SARS-CoV-2 by FDA under an Emergency Use Authorization (EUA).  This EUA will remain in effect (meaning this test can be used) for the duration of  the COVID-19 de claration under Section 564(b)(1) of the Act, 21 U.S.C. section 360bbb-3(b)(1), unless the authorization is terminated or revoked sooner.   POC SARS Coronavirus 2 Ag-ED -     Status: None   Collection Time: 09/20/19  5:02 PM  Result Value Ref Range   SARS Coronavirus 2 Ag NEGATIVE NEGATIVE    Comment: (NOTE) SARS-CoV-2 antigen NOT DETECTED.  Negative results are presumptive.  Negative results do not preclude SARS-CoV-2 infection and should not be used as the sole basis for treatment or other patient management decisions, including infection  control decisions, particularly in the presence of clinical signs and  symptoms consistent with COVID-19, or in those who have been in contact with the virus.  Negative results must be combined with clinical observations, patient history, and epidemiological information. The expected result is Negative. Fact Sheet for Patients:  PodPark.tn Fact Sheet for Healthcare Providers: GiftContent.is This test is not yet approved or cleared by the [place] FDA and  has been authorized for detection and/or diagnosis of SARS-CoV-2 by FDA under an Emergency Use Authorization (EUA).  This EUA will remain in effect (meaning this test can be used) for the duration of  the COVID-19 de claration under Section 564(b)(1) of the Act, 21 U.S.C. section 360bbb-3(b)(1),  unless the authorization is terminated or revoked sooner.   Respiratory Panel by RT PCR (Flu A&B, Covid) - Nasopharyngeal Swab     Status: None   Collection Time: 09/20/19  5:50 PM   Specimen: Nasopharyngeal Swab  Result Value Ref Range   SARS Coronavirus 2 by RT PCR NEGATIVE NEGATIVE    Comment: (NOTE) SARS-CoV-2 target nucleic acids are NOT DETECTED. The SARS-CoV-2 RNA is generally detectable in upper respiratoy specimens during the acute phase of infection. The lowest concentration of SARS-CoV-2 viral copies this assay can detect is 131 copies/mL. A negative result does not preclude SARS-Cov-2 infection and should not be used as the sole basis for treatment or other patient management decisions. A negative result may occur with  improper specimen collection/handling, submission of specimen other than nasopharyngeal swab, presence of viral mutation(s) within the areas targeted by this assay, and inadequate number of viral copies (<131 copies/mL). A negative result must be combined with clinical observations, patient history, and epidemiological information. The expected result is Negative. Fact Sheet for Patients:  PinkCheek.be Fact Sheet for Healthcare Providers:  GravelBags.it This test is not yet ap proved or cleared by the [place] FDA and  has been authorized for detection and/or diagnosis of SARS-CoV-2 by FDA under an Emergency Use  Authorization (EUA). This EUA will remain  in effect (meaning this test can be used) for the duration of the COVID-19 declaration under Section 564(b)(1) of the Act, 21 U.S.C. section 360bbb-3(b)(1), unless the authorization is terminated or revoked sooner.    Influenza A by PCR NEGATIVE NEGATIVE   Influenza B by PCR NEGATIVE NEGATIVE    Comment: (NOTE) The Xpert Xpress SARS-CoV-2/FLU/RSV assay is intended as an aid in  the diagnosis of influenza from Nasopharyngeal swab specimens and  should not be used as a sole basis for treatment. Nasal washings and  aspirates are unacceptable for Xpert Xpress SARS-CoV-2/FLU/RSV  testing. Fact Sheet for Patients: PinkCheek.be Fact Sheet for Healthcare Providers: GravelBags.it This test is not yet approved or cleared by the [place] FDA and  has been authorized for detection and/or diagnosis of SARS-CoV-2 by  FDA under an Emergency Use Authorization (EUA). This EUA will remain  in effect (meaning this test can be used) for the duration of the  Covid-19 declaration under Section 564(b)(1) of the Act, 21  U.S.C. section 360bbb-3(b)(1), unless the authorization is  terminated or revoked. Performed at [facility], [place] [address] [place], [place] [address]   Lactic acid, plasma     Status: Abnormal   Collection Time: 09/20/19  6:36 PM  Result Value Ref Range   Lactic Acid, Venous 4.7 (HH) 0.5 - 1.9 mmol/L    Comment: CRITICAL RESULT CALLED TO, READ BACK BY AND VERIFIED WITH: Maggie Schwalbe 1949 09/20/2019 D BRADLEY Performed at [facility], [place] [address] [place], [place] [address]   Urinalysis, Routine w reflex microscopic     Status: Abnormal   Collection Time: 09/20/19  6:40 PM  Result Value Ref Range   Color, Urine AMBER (A) YELLOW    Comment: BIOCHEMICALS MAY BE AFFECTED BY COLOR   APPearance CLOUDY (A) CLEAR   Specific Gravity, Urine 1.018 1.005 - 1.030   pH 5.0 5.0  - 8.0   Glucose, UA NEGATIVE NEGATIVE mg/dL   Hgb urine dipstick NEGATIVE NEGATIVE   Bilirubin Urine NEGATIVE NEGATIVE   Ketones, ur NEGATIVE NEGATIVE mg/dL   Protein, ur >=300 (A) NEGATIVE mg/dL   Nitrite POSITIVE (A) NEGATIVE  Leukocytes,Ua TRACE (A) NEGATIVE   RBC / HPF 0-5 0 - 5 RBC/hpf   WBC, UA >50 (H) 0 - 5 WBC/hpf   Bacteria, UA MANY (A) NONE SEEN   Squamous Epithelial / LPF 0-5 0 - 5   WBC Clumps PRESENT    Mucus PRESENT    Amorphous Crystal PRESENT    Non Squamous Epithelial 0-5 (A) NONE SEEN    Comment: Performed at [facility], [place] [address] [place], [place] [address]  CBG monitoring, ED     Status: Abnormal   Collection Time: 09/20/19 10:42 PM  Result Value Ref Range   Glucose-Capillary 126 (H) 70 - 99 mg/dL  TSH     Status: None   Collection Time: 09/20/19 11:17 PM  Result Value Ref Range   TSH 1.157 0.350 - 4.500 uIU/mL    Comment: Performed by a 3rd Generation assay with a functional sensitivity of <=0.01 uIU/mL. Performed at [facility], [place] [address], [place], [place] [address]   Cortisol     Status: None   Collection Time: 09/20/19 11:17 PM  Result Value Ref Range   Cortisol, Plasma 32.6 ug/dL    Comment: (NOTE) AM    6.7 - 22.6 ug/dL PM   <10.0       ug/dL Performed at [facility] [address] [place], [place] [address]     Chemistries  Recent Labs  Lab 09/20/19 1621  NA 137  K 4.0  CL 104  CO2 17*  GLUCOSE 173*  BUN 27*  CREATININE 2.37*  CALCIUM 8.8*  AST 98*  ALT 69*  ALKPHOS 83  BILITOT 1.1   ------------------------------------------------------------------------------------------------------------------  ------------------------------------------------------------------------------------------------------------------ GFR: CrCl cannot be calculated (Unknown ideal weight.). Liver Function Tests: Recent Labs  Lab 09/20/19 1621  AST 98*  ALT 69*  ALKPHOS 83  BILITOT 1.1  PROT 5.9*  ALBUMIN 2.6*    No results for input(s): LIPASE, AMYLASE in the last 168 hours. No results for input(s): AMMONIA in the last 168 hours. Coagulation Profile: Recent Labs  Lab 09/20/19 1621  INR 1.3*   Cardiac Enzymes: No results for input(s): CKTOTAL, CKMB, CKMBINDEX, TROPONINI in the last 168 hours. BNP (last 3 results) No results for input(s): PROBNP in the last 8760 hours. HbA1C: No results for input(s): HGBA1C in the last 72 hours. CBG: Recent Labs  Lab 09/20/19 2242  GLUCAP 126*   Lipid Profile: No results for input(s): CHOL, HDL, LDLCALC, TRIG, CHOLHDL, LDLDIRECT in the last 72 hours. Thyroid Function Tests: Recent Labs    09/20/19 2317  TSH 1.157   Anemia Panel: No results for input(s): VITAMINB12, FOLATE, FERRITIN, TIBC, IRON, RETICCTPCT in the last 72 hours.  --------------------------------------------------------------------------------------------------------------- Urine analysis:    Component Value Date/Time   COLORURINE AMBER (A) 09/20/2019 1840   APPEARANCEUR CLOUDY (A) 09/20/2019 1840   LABSPEC 1.018 09/20/2019 1840   PHURINE 5.0 09/20/2019 1840   GLUCOSEU NEGATIVE 09/20/2019 1840   HGBUR NEGATIVE 09/20/2019 1840   BILIRUBINUR NEGATIVE 09/20/2019 1840   KETONESUR NEGATIVE 09/20/2019 1840   PROTEINUR >=300 (A) 09/20/2019 1840   NITRITE POSITIVE (A) 09/20/2019 1840   LEUKOCYTESUR TRACE (A) 09/20/2019 1840      Imaging Results:    DG Chest Port 1 View  Result Date: 09/20/2019 CLINICAL DATA:  Lethargy, possible sepsis EXAM: PORTABLE CHEST 1 VIEW COMPARISON:  06/29/2019 FINDINGS: Cardiomegaly. Low volume AP portable examination with mild, diffuse bilateral interstitial opacity. The visualized skeletal structures are unremarkable. IMPRESSION: Cardiomegaly. There  is mild, diffuse bilateral interstitial opacity, likely mild edema although exaggerated by low volume AP portable technique. No focal airspace opacity. Electronically Signed   By: Eddie Candle M.D.   On:  09/20/2019 16:54   ekg afib at 130, nl axis,    Assessment & Plan:    Principal Problem:   Sepsis ([place]) Active Problems:   Acute lower UTI   Atrial fibrillation with RVR (HCC)   Septic shock (HCC)   Hypotension  Sepsis secondary to Acute lower UTI Acute Lower UTI Blood culture x2 Urine culture Cont Rocephin 1gm iv qday  Hypotension Tele Trop I q2h x2 Check cardiac echo  STOP Losartan, STOP Nifedipine for now, resume as bp improves Unclear if we can continue fluids in light of CXR findings.  Will taper down from 146m to 577mper hour  ARF STOP Losartan Check urine sodium, urine creatinine, urine eosinophils Check renal ultrasound Pt has been hydrated Check cmp in am  AG acidosis, Lactic acidosis Likely secondary to metformin in setting of ARF STOP Metformin  Afib with RVR Tele Trop I q2h x2 Check tsh Check cardiac echo  Consider cardizem if hr not improving with improvement in sepsis Pt states that she has hx of Afib  In the past, unclear why not on anticoagulation.   Start Eliquis pharmacy to dose  Dm2 STOP Janumet XR as above fsbs ac and qhs, ISS  Abnormal liver function Check acute hepatitis panel Check cpk Check RUQ ultrasound Check cmp in am  Severe protein calorie malnutrition Prostat 30 mL po bid     DVT Prophylaxis-   Heparin -> eliquis.   SCDs   AM Labs Ordered, also please review Full Orders  Family Communication: Admission, patients condition and plan of care including tests being ordered have been discussed with the patient  who indicate understanding and agree with the plan and Code Status.  Code Status:  FULL CODE per patient  Admission status:  Inpatient: Based on patients clinical presentation and evaluation of above clinical data, I have made determination that patient meets Inpatient criteria at this time.  Pt will require iv abx for sepsis secondary to UTI, pt has high risk of clinical deterioration, pt will require > 2 nites  stay.   Time spent in minutes : 70 minutes   JaJani Gravel.D on 09/21/2019 at 1:45 AM

## 2019-09-21 NOTE — ED Notes (Signed)
Pt placed back on [place] and repositioned onto her left side.

## 2019-09-21 NOTE — ED Notes (Signed)
ED TO INPATIENT HANDOFF REPORT  ED Nurse Name and Phone #:  Marye Round [phone]  S Name/Age/Gender Artis Delay 74 y.o. female Room/Bed: 023C/023C  Code Status   Code Status: Full Code  Home/SNF/Other Home Patient oriented to: self, place, time and situation Is this baseline? Yes   Triage Complete: Triage complete  Chief Complaint Septic shock ([place]) [A41.9, R65.21] Sepsis ([place]) [A41.9]  Triage Note Pt in from home with family via [facility] for increased lethargy since this am. Per EMS, pt has had UTI and finished 10 day course of abx 3 days ago. Presents hypotensive, tachypneic and HR 110-150's Afib. Initially 68 palpated for EMS, given 500 ml's NS and recheck 89/47. A&ox4, just lethargic. Wears 3LNC at home, sats 97%, CBG 182 PTA    Allergies Allergies  Allergen Reactions  . Meloxicam Other (See Comments)    Causes excess Fluid buildup    Level of Care/Admitting Diagnosis ED Disposition    ED Disposition Condition [facility] Area: [place] [[id number]]  Level of Care: Progressive [102]  Admit to Progressive based on following criteria: MULTISYSTEM THREATS such as stable sepsis, metabolic/electrolyte imbalance with or without encephalopathy that is responding to early treatment.  Covid Evaluation: Confirmed COVID Negative  Diagnosis: Sepsis [facility]) [[id number]]  Admitting Physician: Jani Gravel [place]  Attending Physician: Jani Gravel [phone]  Estimated length of stay: past midnight tomorrow  Certification:: I certify this patient will need inpatient services for at least 2 midnights       B Medical/Surgery History Past Medical History:  Diagnosis Date  . Adenomatous polyp 12/04/2006  . Asthma   . COPD (chronic obstructive pulmonary disease) ([place])   . DM type 2 (diabetes mellitus, type 2) ([place])   . Hemorrhoid 12/04/2006  . Hyperlipidemia   . Hypertension   . Peripheral arterial disease [facility])    Past Surgical History:  Procedure  Laterality Date  . CHOLECYSTECTOMY    . COLONOSCOPY  12/03/2006   Dr. Delight Ovens, adenomatous polyp  . COLONOSCOPY  03/25/2012   Procedure: COLONOSCOPY;  Surgeon: Daneil Dolin, MD;  Location: [facility];  Service: Endoscopy;  Laterality: N/A;  10:30  . ESOPHAGOGASTRODUODENOSCOPY  11/03/2002   Dr. Gala Romney- normal exam- was done to check for possible foreign body  . Fiberoptic bronchoscopy with endobronchial  ultrasound  10/22/2010   [place]  . Right BKA  1990  . RIGHT HEART CATHETERIZATION N/A 06/22/2014   Procedure: RIGHT HEART CATH;  Surgeon: Larey Dresser, MD;  Location: [facility] CATH LAB;  Service: Cardiovascular;  Laterality: N/A;     A IV Location/Drains/Wounds Patient Lines/Drains/Airways Status   Active Line/Drains/Airways    Name:   Placement date:   Placement time:   Site:   Days:   Peripheral IV 09/20/19 Anterior;Left Hand   09/20/19    1529    Hand   1   Peripheral IV 09/20/19 Right Forearm   09/20/19    1617    Forearm   1          Intake/Output Last 24 hours  Intake/Output Summary (Last 24 hours) at 09/21/2019 2001 Last data filed at 09/21/2019 1811 Gross per 24 hour  Intake 100 ml  Output --  Net 100 ml    Labs/Imaging Results for orders placed or performed during the hospital encounter of 09/20/19 (from the past 48 hour(s))  Blood Culture (routine x 2)     Status: None (Preliminary result)   Collection Time: 09/20/19  4:05 PM   Specimen: BLOOD  Result Value Ref Range   Specimen Description BLOOD LEFT ANTECUBITAL    Special Requests      BOTTLES DRAWN AEROBIC AND ANAEROBIC Blood Culture results may not be optimal due to an inadequate volume of blood received in culture bottles   Culture      NO GROWTH < 24 HOURS Performed at [facility] [address], [place], [place] [address]    Report Status PENDING   Blood Culture (routine x 2)     Status: None (Preliminary result)   Collection Time: 09/20/19  4:18 PM   Specimen: BLOOD LEFT FOREARM  Result  Value Ref Range   Specimen Description BLOOD LEFT FOREARM    Special Requests      BOTTLES DRAWN AEROBIC AND ANAEROBIC Blood Culture results may not be optimal due to an inadequate volume of blood received in culture bottles   Culture      NO GROWTH < 24 HOURS Performed at [facility], [place] [address], [place], [place] [address]    Report Status PENDING   Comprehensive metabolic panel     Status: Abnormal   Collection Time: 09/20/19  4:21 PM  Result Value Ref Range   Sodium 137 135 - 145 mmol/L   Potassium 4.0 3.5 - 5.1 mmol/L   Chloride 104 98 - 111 mmol/L   CO2 17 (L) 22 - 32 mmol/L   Glucose, Bld 173 (H) 70 - 99 mg/dL   BUN 27 (H) 8 - 23 mg/dL   Creatinine, Ser 2.37 (H) 0.44 - 1.00 mg/dL   Calcium 8.8 (L) 8.9 - 10.3 mg/dL   Total Protein 5.9 (L) 6.5 - 8.1 g/dL   Albumin 2.6 (L) 3.5 - 5.0 g/dL   AST 98 (H) 15 - 41 U/L   ALT 69 (H) 0 - 44 U/L   Alkaline Phosphatase 83 38 - 126 U/L   Total Bilirubin 1.1 0.3 - 1.2 mg/dL   GFR calc non Af Amer 20 (L) >60 mL/min   GFR calc Af Amer 23 (L) >60 mL/min   Anion gap 16 (H) 5 - 15    Comment: Performed at [facility], [place] [address], [place], [place] [address]  CBC WITH DIFFERENTIAL     Status: Abnormal   Collection Time: 09/20/19  4:21 PM  Result Value Ref Range   WBC 31.0 (H) 4.0 - 10.5 K/uL   RBC 3.88 3.87 - 5.11 MIL/uL   Hemoglobin 11.2 (L) 12.0 - 15.0 g/dL   HCT 35.0 (L) 36.0 - 46.0 %   MCV 90.2 80.0 - 100.0 fL   MCH 28.9 26.0 - 34.0 pg   MCHC 32.0 30.0 - 36.0 g/dL   RDW 13.9 11.5 - 15.5 %   Platelets 314 150 - 400 K/uL   nRBC 0.0 0.0 - 0.2 %   Neutrophils Relative % 95 %   Neutro Abs 29.1 (H) 1.7 - 7.7 K/uL   Lymphocytes Relative 1 %   Lymphs Abs 0.4 (L) 0.7 - 4.0 K/uL   Monocytes Relative 3 %   Monocytes Absolute 1.0 0.1 - 1.0 K/uL   Eosinophils Relative 0 %   Eosinophils Absolute 0.0 0.0 - 0.5 K/uL   Basophils Relative 0 %   Basophils Absolute 0.1 0.0 - 0.1 K/uL   WBC Morphology INCREASED BANDS (>20%  BANDS)    Immature Granulocytes 1 %   Abs Immature Granulocytes 0.44 (H) 0.00 - 0.07 K/uL  Comment: Performed at [facility], [place] [address] [place], [place] [address]  APTT     Status: None   Collection Time: 09/20/19  4:21 PM  Result Value Ref Range   aPTT 36 24 - 36 seconds    Comment: Performed at [facility] [address], [place], [place] [address]  Protime-INR     Status: Abnormal   Collection Time: 09/20/19  4:21 PM  Result Value Ref Range   Prothrombin Time 16.1 (H) 11.4 - 15.2 seconds   INR 1.3 (H) 0.8 - 1.2    Comment: (NOTE) INR goal varies based on device and disease states. Performed at [facility], [place] [address], [place], [place] [address]   Lactic acid, plasma     Status: Abnormal   Collection Time: 09/20/19  4:30 PM  Result Value Ref Range   Lactic Acid, Venous 5.0 (HH) 0.5 - 1.9 mmol/L    Comment: CRITICAL RESULT CALLED TO, READ BACK BY AND VERIFIED WITH: Neysa Bonito 1710 09/20/2019 D BRADLEY Performed at [facility], [place] [address], [place], [place] [address]   POC SARS Coronavirus 2 Ag-ED - Nasal Swab (BD Veritor Kit)     Status: None   Collection Time: 09/20/19  5:02 PM  Result Value Ref Range   SARS Coronavirus 2 Ag NEGATIVE NEGATIVE    Comment: (NOTE) SARS-CoV-2 antigen NOT DETECTED.  Negative results are presumptive.  Negative results do not preclude SARS-CoV-2 infection and should not be used as the sole basis for treatment or other patient management decisions, including infection  control decisions, particularly in the presence of clinical signs and  symptoms consistent with COVID-19, or in those who have been in contact with the virus.  Negative results must be combined with clinical observations, patient history, and epidemiological information. The expected result is Negative. Fact Sheet for Patients: PodPark.tn Fact Sheet for Healthcare  Providers: GiftContent.is This test is not yet approved or cleared by the [place] FDA and  has been authorized for detection and/or diagnosis of SARS-CoV-2 by FDA under an Emergency Use Authorization (EUA).  This EUA will remain in effect (meaning this test can be used) for the duration of  the COVID-19 de claration under Section 564(b)(1) of the Act, 21 U.S.C. section 360bbb-3(b)(1), unless the authorization is terminated or revoked sooner.   POC SARS Coronavirus 2 Ag-ED -     Status: None   Collection Time: 09/20/19  5:02 PM  Result Value Ref Range   SARS Coronavirus 2 Ag NEGATIVE NEGATIVE    Comment: (NOTE) SARS-CoV-2 antigen NOT DETECTED.  Negative results are presumptive.  Negative results do not preclude SARS-CoV-2 infection and should not be used as the sole basis for treatment or other patient management decisions, including infection  control decisions, particularly in the presence of clinical signs and  symptoms consistent with COVID-19, or in those who have been in contact with the virus.  Negative results must be combined with clinical observations, patient history, and epidemiological information. The expected result is Negative. Fact Sheet for Patients: PodPark.tn Fact Sheet for Healthcare Providers: GiftContent.is This test is not yet approved or cleared by the [place] FDA and  has been authorized for detection and/or diagnosis of SARS-CoV-2 by FDA under an Emergency Use Authorization (EUA).  This EUA will remain in effect (meaning this test can be used) for the duration of  the COVID-19 de claration under Section 564(b)(1) of the Act, 21 U.S.C. section 360bbb-3(b)(1),  unless the authorization is terminated or revoked sooner.   Respiratory Panel by RT PCR (Flu A&B, Covid) - Nasopharyngeal Swab     Status: None   Collection Time: 09/20/19  5:50 PM   Specimen:  Nasopharyngeal Swab  Result Value Ref Range   SARS Coronavirus 2 by RT PCR NEGATIVE NEGATIVE    Comment: (NOTE) SARS-CoV-2 target nucleic acids are NOT DETECTED. The SARS-CoV-2 RNA is generally detectable in upper respiratoy specimens during the acute phase of infection. The lowest concentration of SARS-CoV-2 viral copies this assay can detect is 131 copies/mL. A negative result does not preclude SARS-Cov-2 infection and should not be used as the sole basis for treatment or other patient management decisions. A negative result may occur with  improper specimen collection/handling, submission of specimen other than nasopharyngeal swab, presence of viral mutation(s) within the areas targeted by this assay, and inadequate number of viral copies (<131 copies/mL). A negative result must be combined with clinical observations, patient history, and epidemiological information. The expected result is Negative. Fact Sheet for Patients:  https://www.fda.gov/media/142436/download Fact Sheet for Healthcare Providers:  https://www.fda.gov/media/142435/download This test is not yet ap proved or cleared by the [place] FDA and  has been authorized for detection and/or diagnosis of SARS-CoV-2 by FDA under an Emergency Use Authorization (EUA). This EUA will remain  in effect (meaning this test can be used) for the duration of the COVID-19 declaration under Section 564(b)(1) of the Act, 21 U.S.C. section 360bbb-3(b)(1), unless the authorization is terminated or revoked sooner.    Influenza A by PCR NEGATIVE NEGATIVE   Influenza B by PCR NEGATIVE NEGATIVE    Comment: (NOTE) The Xpert Xpress SARS-CoV-2/FLU/RSV assay is intended as an aid in  the diagnosis of influenza from Nasopharyngeal swab specimens and  should not be used as a sole basis for treatment. Nasal washings and  aspirates are unacceptable for Xpert Xpress SARS-CoV-2/FLU/RSV  testing. Fact Sheet for  Patients: https://www.fda.gov/media/142436/download Fact Sheet for Healthcare Providers: https://www.fda.gov/media/142435/download This test is not yet approved or cleared by the [place] FDA and  has been authorized for detection and/or diagnosis of SARS-CoV-2 by  FDA under an Emergency Use Authorization (EUA). This EUA will remain  in effect (meaning this test can be used) for the duration of the  Covid-19 declaration under Section 564(b)(1) of the Act, 21  U.S.C. section 360bbb-3(b)(1), unless the authorization is  terminated or revoked. Performed at [facility], [address] [place], [place] [address]   Lactic acid, plasma     Status: Abnormal   Collection Time: 09/20/19  6:36 PM  Result Value Ref Range   Lactic Acid, Venous 4.7 (HH) 0.5 - 1.9 mmol/L    Comment: CRITICAL RESULT CALLED TO, READ BACK BY AND VERIFIED WITH: J RAMOS,RN 1949 09/20/2019 D BRADLEY Performed at [facility], [address] , [place] [address]   Urinalysis, Routine w reflex microscopic     Status: Abnormal   Collection Time: 09/20/19  6:40 PM  Result Value Ref Range   Color, Urine AMBER (A) YELLOW    Comment: BIOCHEMICALS MAY BE AFFECTED BY COLOR   APPearance CLOUDY (A) CLEAR   Specific Gravity, Urine 1.018 1.005 - 1.030   pH 5.0 5.0 - 8.0   Glucose, UA NEGATIVE NEGATIVE mg/dL   Hgb urine dipstick NEGATIVE NEGATIVE   Bilirubin Urine NEGATIVE NEGATIVE   Ketones, ur NEGATIVE NEGATIVE mg/dL   Protein, ur >=300 (A) NEGATIVE mg/dL   Nitrite POSITIVE (A) NEGATIVE     Leukocytes,Ua TRACE (A) NEGATIVE   RBC / HPF 0-5 0 - 5 RBC/hpf   WBC, UA >50 (H) 0 - 5 WBC/hpf   Bacteria, UA MANY (A) NONE SEEN   Squamous Epithelial / LPF 0-5 0 - 5   WBC Clumps PRESENT    Mucus PRESENT    Amorphous Crystal PRESENT    Non Squamous Epithelial 0-5 (A) NONE SEEN    Comment: Performed at [facility], [address] [place], [place] [address]  CBG monitoring, ED     Status: Abnormal    Collection Time: 09/20/19 10:42 PM  Result Value Ref Range   Glucose-Capillary 126 (H) 70 - 99 mg/dL  TSH     Status: None   Collection Time: 09/20/19 11:17 PM  Result Value Ref Range   TSH 1.157 0.350 - 4.500 uIU/mL    Comment: Performed by a 3rd Generation assay with a functional sensitivity of <=0.01 uIU/mL. Performed at [facility], [address] [place], [place] [address]   Cortisol     Status: None   Collection Time: 09/20/19 11:17 PM  Result Value Ref Range   Cortisol, Plasma 32.6 ug/dL    Comment: (NOTE) AM    6.7 - 22.6 ug/dL PM   <10.0       ug/dL Performed at [facility], [address] [place], [place] [address]   CBC     Status: Abnormal   Collection Time: 09/21/19  2:04 AM  Result Value Ref Range   WBC 32.9 (H) 4.0 - 10.5 K/uL   RBC 3.43 (L) 3.87 - 5.11 MIL/uL   Hemoglobin 10.2 (L) 12.0 - 15.0 g/dL   HCT 30.4 (L) 36.0 - 46.0 %   MCV 88.6 80.0 - 100.0 fL   MCH 29.7 26.0 - 34.0 pg   MCHC 33.6 30.0 - 36.0 g/dL   RDW 14.2 11.5 - 15.5 %   Platelets 262 150 - 400 K/uL   nRBC 0.0 0.0 - 0.2 %    Comment: Performed at [facility], [address] [place], [place] [address]  Magnesium     Status: Abnormal   Collection Time: 09/21/19  2:04 AM  Result Value Ref Range   Magnesium 0.9 (LL) 1.7 - 2.4 mg/dL    Comment: CRITICAL RESULT CALLED TO, READ BACK BY AND VERIFIED WITH: A.POWELL,RN 0253 09/21/2019 M.CAMPBELL Performed at [facility], [address] [place], [place] [address]   Phosphorus     Status: None   Collection Time: 09/21/19  2:04 AM  Result Value Ref Range   Phosphorus 4.5 2.5 - 4.6 mg/dL    Comment: Performed at [facility], [address] [place],  [address]  Basic metabolic panel     Status: Abnormal   Collection Time: 09/21/19  2:04 AM  Result Value Ref Range   Sodium 139 135 - 145 mmol/L   Potassium 4.5 3.5 - 5.1 mmol/L   Chloride 107 98 - 111 mmol/L   CO2 20 (L) 22 - 32 mmol/L   Glucose, Bld 159  (H) 70 - 99 mg/dL   BUN 30 (H) 8 - 23 mg/dL   Creatinine, Ser 1.72 (H) 0.44 - 1.00 mg/dL   Calcium 8.1 (L) 8.9 - 10.3 mg/dL   GFR calc non Af Amer 29 (L) >60 mL/min   GFR calc Af Amer 34 (L) >60 mL/min   Anion gap 12 5 - 15    Comment: Performed at   [facility], [address] [place], [place] [address]  Troponin I (High Sensitivity)     Status: Abnormal   Collection Time: 09/21/19  2:04 AM  Result Value Ref Range   Troponin I (High Sensitivity) 759 (HH) <18 ng/L    Comment: CRITICAL RESULT CALLED TO, READ BACK BY AND VERIFIED WITH: A.POWELL,RN 0253 09/21/2019 M.CAMPBELL (NOTE) Elevated high sensitivity troponin I (hsTnI) values and significant  changes across serial measurements may suggest ACS but many other  chronic and acute conditions are known to elevate hsTnI results.  Refer to the Links section for chest pain algorithms and additional  guidance. Performed at [facility], [address] [place], [place] [address]   Brain natriuretic peptide     Status: Abnormal   Collection Time: 09/21/19  2:04 AM  Result Value Ref Range   B Natriuretic Peptide 457.2 (H) 0.0 - 100.0 pg/mL    Comment: Performed at [facility], [address] [place], [place] [address]  CBG monitoring, ED     Status: Abnormal   Collection Time: 09/21/19  3:08 AM  Result Value Ref Range   Glucose-Capillary 140 (H) 70 - 99 mg/dL  CBC     Status: Abnormal   Collection Time: 09/21/19  5:28 AM  Result Value Ref Range   WBC 30.7 (H) 4.0 - 10.5 K/uL   RBC 3.49 (L) 3.87 - 5.11 MIL/uL   Hemoglobin 10.2 (L) 12.0 - 15.0 g/dL   HCT 30.7 (L) 36.0 - 46.0 %   MCV 88.0 80.0 - 100.0 fL   MCH 29.2 26.0 - 34.0 pg   MCHC 33.2 30.0 - 36.0 g/dL   RDW 14.2 11.5 - 15.5 %   Platelets 272 150 - 400 K/uL   nRBC 0.0 0.0 - 0.2 %    Comment: Performed at [facility], [address] [place], [place] [address]  Creatinine, serum     Status: Abnormal   Collection Time: 09/21/19  5:28 AM  Result  Value Ref Range   Creatinine, Ser 1.63 (H) 0.44 - 1.00 mg/dL   GFR calc non Af Amer 31 (L) >60 mL/min   GFR calc Af Amer 36 (L) >60 mL/min    Comment: Performed at [facility], [address] [place],  [address]  Procalcitonin     Status: None   Collection Time: 09/21/19  5:28 AM  Result Value Ref Range   Procalcitonin 34.95 ng/mL    Comment:        Interpretation: PCT >= 10 ng/mL: Important systemic inflammatory response, almost exclusively due to severe bacterial sepsis or septic shock. (NOTE)       Sepsis PCT Algorithm           Lower Respiratory Tract                                      Infection PCT Algorithm    ----------------------------     ----------------------------         PCT < 0.25 ng/mL                PCT < 0.10 ng/mL         Strongly encourage             Strongly discourage   discontinuation of antibiotics    initiation of antibiotics    ----------------------------     -----------------------------         PCT 0.25 - 0.50 ng/mL            PCT 0.10 - 0.25 ng/mL               OR       >80% decrease in PCT            Discourage initiation of                                            antibiotics      Encourage discontinuation           of antibiotics    ----------------------------     -----------------------------         PCT >= 0.50 ng/mL              PCT 0.26 - 0.50 ng/mL                AND       <80% decrease in PCT             Encourage initiation of                                             antibiotics       Encourage continuation           of antibiotics    ----------------------------     -----------------------------        PCT >= 0.50 ng/mL                  PCT > 0.50 ng/mL               AND         increase in PCT                  Strongly encourage                                      initiation of antibiotics    Strongly encourage escalation           of antibiotics                                      -----------------------------                                           PCT <= 0.25 ng/mL                                                 OR                                        > 80% decrease in PCT                                       Discontinue / Do not initiate                                             antibiotics Performed at [facility], [address] [place], [place] [address]   Hepatitis panel, acute     Status: None   Collection Time: 09/21/19  5:28 AM  Result Value Ref Range   Hepatitis B Surface Ag NON REACTIVE NON REACTIVE   HCV Ab NON REACTIVE NON REACTIVE    Comment: (NOTE) Nonreactive HCV antibody screen is consistent with no HCV infections,  unless recent infection is suspected or other evidence exists to indicate HCV infection.    Hep A IgM NON REACTIVE NON REACTIVE   Hep B C IgM NON REACTIVE NON REACTIVE    Comment: Performed at [facility], [address] [place], [place] [address]  CK total and CKMB (cardiac)not at [facility]     Status: Abnormal   Collection Time: 09/21/19  5:28 AM  Result Value Ref Range   Total CK 36 (L) 38 - 234 U/L   CK, MB 9.2 (H) 0.5 - 5.0 ng/mL   Relative Index RELATIVE INDEX IS INVALID 0.0 - 2.5    Comment: WHEN CK < 100 U/L        Performed at [facility], [address] [place], [place] [address]   Sodium, urine, random     Status: None   Collection Time: 09/21/19  5:28 AM  Result Value Ref Range   Sodium, Ur 43 mmol/L    Comment: Performed at [facility], [address] [place], [place] [address]  Protein / creatinine ratio, urine     Status: Abnormal   Collection Time: 09/21/19  5:28 AM  Result Value Ref Range   Creatinine, Urine 55.97 mg/dL   Total Protein, Urine 28 mg/dL    Comment: NO NORMAL RANGE ESTABLISHED FOR THIS TEST   Protein Creatinine Ratio 0.50 (H) 0.00 - 0.15 mg/mg[Cre]    Comment: Performed at [facility], [address] [place], [place] [address]  Troponin I (High  Sensitivity)     Status: Abnormal   Collection Time: 09/21/19  5:28 AM  Result Value Ref Range   Troponin I (High Sensitivity) 679 (HH) <18 ng/L    Comment: CRITICAL VALUE NOTED.  VALUE IS CONSISTENT WITH PREVIOUSLY REPORTED AND CALLED VALUE. (NOTE) Elevated high sensitivity troponin I (hsTnI) values and significant  changes across serial measurements may suggest ACS but many other  chronic and acute conditions are known to elevate hsTnI results.  Refer to the Links section for chest pain algorithms and additional  guidance. Performed at [facility], [address] [place],  [address]   CBG monitoring, ED     Status: Abnormal   Collection Time: 09/21/19  7:37 AM  Result Value Ref Range   Glucose-Capillary 146 (H) 70 - 99 mg/dL  CBG monitoring, ED     Status: Abnormal   Collection Time: 09/21/19 12:24 PM  Result Value Ref Range   Glucose-Capillary 167 (H) 70 - 99 mg/dL  CBG monitoring, ED     Status: Abnormal   Collection Time: 09/21/19  3:27 PM  Result Value Ref Range   Glucose-Capillary 204 (H) 70 - 99 mg/dL   Comment 1 Document in Chart   Comprehensive metabolic   panel     Status: Abnormal   Collection Time: 09/21/19  3:42 PM  Result Value Ref Range   Sodium 140 135 - 145 mmol/L   Potassium 4.2 3.5 - 5.1 mmol/L   Chloride 102 98 - 111 mmol/L   CO2 25 22 - 32 mmol/L   Glucose, Bld 218 (H) 70 - 99 mg/dL   BUN 32 (H) 8 - 23 mg/dL   Creatinine, Ser 1.37 (H) 0.44 - 1.00 mg/dL   Calcium 8.2 (L) 8.9 - 10.3 mg/dL   Total Protein 5.9 (L) 6.5 - 8.1 g/dL   Albumin 2.9 (L) 3.5 - 5.0 g/dL   AST 37 15 - 41 U/L   ALT 44 0 - 44 U/L   Alkaline Phosphatase 86 38 - 126 U/L   Total Bilirubin 0.8 0.3 - 1.2 mg/dL   GFR calc non Af Amer 38 (L) >60 mL/min   GFR calc Af Amer 44 (L) >60 mL/min   Anion gap 13 5 - 15    Comment: Performed at [facility], [address], [place], [place] [address]  CBG monitoring, ED     Status: Abnormal   Collection Time: 09/21/19  6:11 PM   Result Value Ref Range   Glucose-Capillary 177 (H) 70 - 99 mg/dL   US Abdomen Complete  Result Date: 09/21/2019 CLINICAL DATA:  Elevated LFTs, BUN and creatinine, history of cholecystectomy EXAM: ABDOMEN ULTRASOUND COMPLETE COMPARISON:  PET-CT 10/18/2010 FINDINGS: Gallbladder: Surgically absent Common bile duct: Diameter: 7.9 mm proximally, 8.4 mm distally, likely within normal limits for patient age and post cholecystectomy state. Liver: Parenchymal echogenicity is within normal limits. Slightly nodular hepatic surface contour. No focal liver lesions are identified. Portal vein is patent on color Doppler imaging with normal direction of blood flow towards the liver. IVC: No abnormality visualized. Pancreas: Visualized portion unremarkable. Portions of the tail and body are obscured by bowel gas. Spleen: Size and appearance within normal limits. Right Kidney: 11.4 x 5.3 x 5.4 cm (volume 170 mL). Echogenicity within normal limits. No mass or hydronephrosis visualized. Left Kidney: 11.0 x 6.1 x 5.4 cm (volume 190 mL). Echogenicity within normal limits. No mass or hydronephrosis visualized. Abdominal aorta: No proximal aneurysm identified. Mid to distal aorta is poorly visualized due to bowel gas. Other findings: None. IMPRESSION: Patient is post cholecystectomy. Prominence of the biliary tree may be a combination of senescent change and post cholecystectomy reservoir effect. Slightly nodular hepatic surface contour, can be seen in the setting of intrinsic liver disease including potential cirrhosis. Correlate with serologies. Electronically Signed   By: [name] M.D.   On: 09/21/2019 03:18   DG Chest Port 1 View  Result Date: 09/20/2019 CLINICAL DATA:  Lethargy, possible sepsis EXAM: PORTABLE CHEST 1 VIEW COMPARISON:  06/29/2019 FINDINGS: Cardiomegaly. Low volume AP portable examination with mild, diffuse bilateral interstitial opacity. The visualized skeletal structures are unremarkable. IMPRESSION:  Cardiomegaly. There is mild, diffuse bilateral interstitial opacity, likely mild edema although exaggerated by low volume AP portable technique. No focal airspace opacity. Electronically Signed   By: Eddie Candle M.D.   On: 09/20/2019 16:54    Pending Labs Unresulted Labs (From admission, onward)    Start     Ordered   09/20/19 1621  Urine culture  ONCE - STAT,   STAT     09/20/19 1622          Vitals/Pain Today's Vitals   09/21/19 1730 09/21/19 1800 09/21/19 1900 09/21/19 1930  BP: 111/68  124/77 114/74 113/78  Pulse: (!) 34 (!) 58 (!) 37 61  Resp: (!) _0 (!) 21  Temp:      TempSrc:      SpO2: 99% 99% 98% 97%  PainSc:        Isolation Precautions No active isolations  Medications Medications  cefTRIAXone (ROCEPHIN) 1 g in sodium chloride 0.9 % 100 mL IVPB (0 g Intravenous Stopped 09/21/19 1811)  hydrocortisone sodium succinate (SOLU-CORTEF) 100 MG injection 100 mg (100 mg Intravenous Given 09/21/19 1651)  pantoprazole (PROTONIX) injection 40 mg (40 mg Intravenous Given 09/21/19 0316)  0.9 %  sodium chloride infusion (0 mLs Intravenous Hold 09/20/19 2317)  sodium bicarbonate 150 mEq in dextrose 5% 1000 mL infusion ( Intravenous Rate/Dose Verify 09/21/19 1659)  feeding supplement (PRO-STAT SUGAR FREE 64) liquid 30 mL (30 mLs Oral Given 09/21/19 1158)  insulin aspart (novoLOG) injection 0-9 Units (3 Units Subcutaneous Given 09/21/19 1644)  insulin aspart (novoLOG) injection 0-5 Units (has no administration in time range)  gabapentin (NEURONTIN) capsule 100 mg (100 mg Oral Given 09/21/19 1651)  loratadine (CLARITIN) tablet 10 mg (10 mg Oral Given 09/21/19 1200)  apixaban (ELIQUIS) tablet 5 mg (5 mg Oral Given 09/21/19 1200)  lactated ringers bolus 1,000 mL (0 mLs Intravenous Stopped 09/20/19 1703)    And  lactated ringers bolus 1,000 mL (0 mLs Intravenous Stopped 09/20/19 1704)    And  lactated ringers bolus 1,000 mL (0 mLs Intravenous Stopped 09/20/19 1829)  lactated ringers bolus 1,000 mL (0  mLs Intravenous Stopped 09/20/19 1927)  albumin human 5 % solution 25 g (0 g Intravenous Stopped 09/21/19 0530)  diltiazem (CARDIZEM) tablet 30 mg (30 mg Oral Given 09/21/19 0900)    Mobility walks Low fall risk   Focused Assessments Cardiac Assessment Handoff:  Cardiac Rhythm: Atrial fibrillation Lab Results  Component Value Date   CKTOTAL 36 (L) 09/21/2019   CKMB 9.2 (H) 09/21/2019   No results found for: DDIMER Does the Patient currently have chest pain? No     R Recommendations: See Admitting Provider Note  Report given to:   Additional Notes:

## 2019-09-21 NOTE — Procedures (Signed)
Echo attempted. Patient stated that her hip and rectum hurt so bad she could not lay on back or left side and tolerate test.

## 2019-09-22 ENCOUNTER — Encounter (HOSPITAL_COMMUNITY): Payer: Self-pay | Admitting: Internal Medicine

## 2019-09-22 ENCOUNTER — Inpatient Hospital Stay (HOSPITAL_COMMUNITY): Payer: Medicare Other

## 2019-09-22 DIAGNOSIS — I340 Nonrheumatic mitral (valve) insufficiency: Secondary | ICD-10-CM

## 2019-09-22 DIAGNOSIS — R627 Adult failure to thrive: Secondary | ICD-10-CM

## 2019-09-22 DIAGNOSIS — E119 Type 2 diabetes mellitus without complications: Secondary | ICD-10-CM

## 2019-09-22 DIAGNOSIS — G9341 Metabolic encephalopathy: Secondary | ICD-10-CM

## 2019-09-22 DIAGNOSIS — Z89511 Acquired absence of right leg below knee: Secondary | ICD-10-CM

## 2019-09-22 DIAGNOSIS — Z794 Long term (current) use of insulin: Secondary | ICD-10-CM

## 2019-09-22 DIAGNOSIS — A419 Sepsis, unspecified organism: Secondary | ICD-10-CM

## 2019-09-22 HISTORY — DX: Sepsis, unspecified organism: A41.9

## 2019-09-22 LAB — GLUCOSE, CAPILLARY
Glucose-Capillary: 198 mg/dL — ABNORMAL HIGH (ref 70–99)
Glucose-Capillary: 273 mg/dL — ABNORMAL HIGH (ref 70–99)
Glucose-Capillary: 281 mg/dL — ABNORMAL HIGH (ref 70–99)
Glucose-Capillary: 364 mg/dL — ABNORMAL HIGH (ref 70–99)

## 2019-09-22 LAB — URINE CULTURE

## 2019-09-22 LAB — ECHOCARDIOGRAM COMPLETE
Height: 69 in
Weight: 3350.99 oz

## 2019-09-22 MED ORDER — HYDROCORTISONE NA SUCCINATE PF 100 MG IJ SOLR
100.0000 mg | Freq: Two times a day (BID) | INTRAMUSCULAR | Status: DC
  Administered 2019-09-23: 03:00:00 100 mg via INTRAVENOUS
  Filled 2019-09-22: qty 2

## 2019-09-22 MED ORDER — DILTIAZEM HCL ER COATED BEADS 120 MG PO CP24
120.0000 mg | ORAL_CAPSULE | Freq: Every day | ORAL | Status: DC
  Administered 2019-09-22 – 2019-09-24 (×3): 120 mg via ORAL
  Filled 2019-09-22 (×3): qty 1

## 2019-09-22 MED ORDER — GLUCERNA SHAKE PO LIQD
237.0000 mL | Freq: Three times a day (TID) | ORAL | Status: DC
  Administered 2019-09-22 – 2019-09-23 (×5): 237 mL via ORAL

## 2019-09-22 MED ORDER — PANTOPRAZOLE SODIUM 40 MG PO TBEC
40.0000 mg | DELAYED_RELEASE_TABLET | Freq: Every day | ORAL | Status: DC
  Administered 2019-09-22 – 2019-09-23 (×2): 40 mg via ORAL
  Filled 2019-09-22 (×2): qty 1

## 2019-09-22 MED ORDER — ADULT MULTIVITAMIN W/MINERALS CH
1.0000 | ORAL_TABLET | Freq: Every day | ORAL | Status: DC
  Administered 2019-09-22 – 2019-09-24 (×3): 1 via ORAL
  Filled 2019-09-22 (×3): qty 1

## 2019-09-22 MED ORDER — MAGNESIUM SULFATE 4 GM/100ML IV SOLN
4.0000 g | INTRAVENOUS | Status: AC
  Administered 2019-09-22: 10:00:00 4 g via INTRAVENOUS
  Filled 2019-09-22: qty 100

## 2019-09-22 MED ORDER — PERFLUTREN LIPID MICROSPHERE
1.0000 mL | INTRAVENOUS | Status: AC | PRN
  Administered 2019-09-22: 15:00:00 2 mL via INTRAVENOUS
  Filled 2019-09-22: qty 10

## 2019-09-22 MED ORDER — ENSURE ENLIVE PO LIQD: 237.0000 mL | Freq: Two times a day (BID) | ORAL | Status: DC

## 2019-09-22 NOTE — Consult Note (Signed)
Cardiology Consultation:   Patient ID: [name] MRN: [id number]; DOB: September 21, 1945  Admit date: 09/20/2019 Date of Consult: 09/22/2019  Primary Care Provider: [name], MD Primary Cardiologist: No primary care provider on file.  Primary Electrophysiologist:  None    Patient Profile:   Debbie [name] is a 74 y.o. female who is being seen today for the evaluation of atrial fibrillation at the request of Dr Erlinda Hong.  History of Present Illness:   [name] is admitted with urosepsis.  She is admitted with confusion and dizziness and felt to have sepsis from a urinary source.  She required 4 L of IV fluid resuscitation and was started on low-dose Levophed.  The patient has a history of chronic O2 dependent lung disease, thromboangiitis obliterans status post right BKA in 1990, type 2 diabetes, and pulmonary hypertension.  She is feeling much better now with antibiotic treatment and fluid resuscitation.  The patient is noted to be in atrial fibrillation with RVR on admission.  This is of unknown duration.  The patient denies any history of atrial fibrillation, although she does remember a time when she was told she had an irregular heart rhythm at the vascular surgery office.  She has never been on chronic oral anticoagulation in the past.  She occasionally feels her heart race.  She has chronic dyspnea, orthopnea, and admits to being dyspneic with low-level activity.  There is no recent change in the symptoms.  She denies chest pain.  An echocardiogram has been completed and is reviewed as outlined below.  Cardiology is asked to evaluate the patient for atrial fibrillation.    Heart Pathway Score:     Past Medical History:  Diagnosis Date  . Adenomatous polyp 12/04/2006  . Asthma   . COPD (chronic obstructive pulmonary disease) ([place])   . DM type 2 (diabetes mellitus, type 2) ([place])   . Hemorrhoid 12/04/2006  . Hyperlipidemia   . Hypertension   . Peripheral arterial disease  ([place])   . Sepsis ([place]) 09/22/2019    Past Surgical History:  Procedure Laterality Date  . CHOLECYSTECTOMY    . COLONOSCOPY  12/03/2006   Dr. Delight Ovens, adenomatous polyp  . COLONOSCOPY  03/25/2012   Procedure: COLONOSCOPY;  Surgeon: Daneil Dolin, MD;  Location: [facility];  Service: Endoscopy;  Laterality: N/A;  10:30  . ESOPHAGOGASTRODUODENOSCOPY  11/03/2002   Dr. Gala Romney- normal exam- was done to check for possible foreign body  . Fiberoptic bronchoscopy with endobronchial  ultrasound  10/22/2010   [place]  . Right BKA  1990  . RIGHT HEART CATHETERIZATION N/A 06/22/2014   Procedure: RIGHT HEART CATH;  Surgeon: Larey Dresser, MD;  Location: [facility] CATH LAB;  Service: Cardiovascular;  Laterality: N/A;     Home Medications:  Prior to Admission medications   Medication Sig Start Date End Date Taking? Authorizing Provider  aspirin EC 81 MG tablet Take 81 mg by mouth daily.     Yes [provider]  Calcium Carb-Cholecalciferol (CALCIUM 1000 + D PO) Take 1,000 mg by mouth daily.   Yes [provider]  cetirizine (ZYRTEC) 10 MG tablet Take 10 mg by mouth daily as needed for allergies.    Yes [provider]  Coenzyme Q10 (CO Q 10 PO) Take 300 mg by mouth daily.   Yes [provider]  dexlansoprazole (DEXILANT) 60 MG capsule Take 1 capsule (60 mg total) by mouth daily. 09/29/15  Yes Tanda Rockers, MD  ezetimibe (ZETIA) 10  MG tablet Take 10 mg by mouth daily. 08/27/19  Yes [provider]  fluticasone (FLONASE) 50 MCG/ACT nasal spray Place 1 spray into the nose daily as needed for allergies.    Yes [provider]  gabapentin (NEURONTIN) 100 MG capsule Take 100 mg by mouth 3 (three) times daily.   Yes [provider]  halobetasol (ULTRAVATE) 0.05 % cream Apply 1 application topically 2 (two) times daily.  08/15/16  Yes [provider]  ibandronate (BONIVA) 150 MG tablet Take 150 mg by mouth every 30 (thirty) days.  08/19/19 02/15/20 Yes [provider]  ibuprofen (ADVIL,MOTRIN) 200 MG tablet Take 600 mg by mouth every 6 (six) hours as needed for headache or mild pain.    Yes [provider]  Insulin Glargine (BASAGLAR KWIKPEN) 100 UNIT/ML SOPN Inject 45 Units into the skin daily.    Yes [provider]  losartan (COZAAR) 100 MG tablet Take 100 mg by mouth daily. 05/31/19  Yes [provider]  NIFEdipine (PROCARDIA XL/ADALAT-CC) 30 MG 24 hr tablet Take 30 mg by mouth daily.  06/07/11  Yes [provider]  OXYGEN Place 3 L into the nose See admin instructions. 3 lpm with sleep and exertion  [facility]    Yes [provider]  Probiotic Product (PROBIOTIC ADVANCED PO) Take 1 tablet by mouth daily.    Yes [provider]  SitaGLIPtin-MetFORMIN HCl (JANUMET XR) 50-1000 MG TB24 Take 1 tablet by mouth 2 (two) times daily.  01/10/14  Yes [provider]  VOLTAREN 1 % GEL Apply 2 g topically 4 (four) times daily as needed (pain).  08/13/17  Yes [provider]  BD PEN NEEDLE NANO U/F 32G X 4 MM MISC 2 (two) times daily. as directed 02/26/15   [provider]  Insulin Pen Needle (BD PEN NEEDLE NANO U/F) 32G X 4 MM MISC USE TWICE DAILY AS DIRECTED 05/31/15   [provider]  nitrofurantoin, macrocrystal-monohydrate, (MACROBID) 100 MG capsule Take 100 mg by mouth 2 (two) times daily. 09/08/19   [provider]    Inpatient Medications: Scheduled Meds: . apixaban  5 mg Oral BID  . feeding supplement (GLUCERNA SHAKE)  237 mL Oral TID BM  . gabapentin  100 mg Oral TID  . [START ON 09/23/2019] hydrocortisone sod succinate (SOLU-CORTEF) inj  100 mg Intravenous Q12H  . insulin aspart  0-5 Units Subcutaneous QHS  . insulin aspart  0-9 Units Subcutaneous TID WC  . loratadine  10 mg Oral Daily  . multivitamin with minerals  1 tablet Oral Daily  . pantoprazole  40 mg Oral QHS   Continuous Infusions: . sodium chloride Stopped  (09/20/19 2317)  . cefTRIAXone (ROCEPHIN)  IV 1 g (09/22/19 1654)  . sodium bicarbonate 150 mEq in dextrose 5% 1000 mL 150 mEq (09/21/19 2012)   PRN Meds:   Allergies:    Allergies  Allergen Reactions  . Meloxicam Other (See Comments)    Causes excess Fluid buildup    Social History:   Social History   Socioeconomic History  . Marital status: Married    Spouse name: Keelie Lindseth  . Number of children: 3  . Years of education: Not on file  . Highest education level: Not on file  Occupational History  . Occupation: retired    Fish farm manager: UNEMPLOYED  Tobacco Use  . Smoking status: Former Smoker    Packs/day: 0.50    Years: 18.00    Pack years: 9.00  Types: Cigarettes    Quit date: 09/16/1990    Years since quitting: 29.0  . Smokeless tobacco: Never Used  Substance and Sexual Activity  . Alcohol use: No    Alcohol/week: 0.0 standard drinks  . Drug use: No  . Sexual activity: Not on file  Other Topics Concern  . Not on file  Social History Narrative  . Not on file   Social Determinants of Health   Financial Resource Strain:   . Difficulty of Paying Living Expenses: Not on file  Food Insecurity:   . Worried About Charity fundraiser in the Last Year: Not on file  . Ran Out of Food in the Last Year: Not on file  Transportation Needs:   . Lack of Transportation (Medical): Not on file  . Lack of Transportation (Non-Medical): Not on file  Physical Activity:   . Days of Exercise per Week: Not on file  . Minutes of Exercise per Session: Not on file  Stress:   . Feeling of Stress : Not on file  Social Connections:   . Frequency of Communication with Friends and Family: Not on file  . Frequency of Social Gatherings with Friends and Family: Not on file  . Attends Religious Services: Not on file  . Active Member of Clubs or Organizations: Not on file  . Attends Archivist Meetings: Not on file  . Marital Status: Not on file  Intimate Partner Violence:   .  Fear of Current or Ex-Partner: Not on file  . Emotionally Abused: Not on file  . Physically Abused: Not on file  . Sexually Abused: Not on file    Family History:    Family History  Problem Relation Age of Onset  . COPD Sister   . Diabetes Sister   . Breast cancer Sister        Mastectomy  . CVA Mother 42  . Heart disease Mother   . Diabetes Mother 51  . Lung cancer Father        lung carcinoma  . Colon cancer Neg Hx      ROS:  Please see the history of present illness.  All other ROS reviewed and negative.     Physical Exam/Data:   Vitals:   09/22/19 0021 09/22/19 0411 09/22/19 0749 09/22/19 1403  BP: 121/88 (!) 138/97 (!) 148/82 123/80  Pulse: (!) 105 (!) 46 (!) 102 92  Resp: (!) 21 (!) 22 20 17   Temp: (!) 97.5 F (36.4 C) 97.6 F (36.4 C) 97.7 F (36.5 C)   TempSrc: Oral Oral Oral   SpO2: 99% 98% 97% 96%  Weight:      Height:        Intake/Output Summary (Last 24 hours) at 09/22/2019 1808 Last data filed at 09/22/2019 [id number] Gross per 24 hour  Intake 1366.77 ml  Output 450 ml  Net 916.77 ml   Last 3 Weights 09/21/2019 06/29/2019 06/25/2018  Weight (lbs) 209 lb 7 oz 208 lb 204 lb  Weight (kg) 95 kg 94.348 kg 92.534 kg     Body mass index is 30.93 kg/m.  General: Pleasant elderly woman, in no acute distress HEENT: normal Lymph: no adenopathy Neck: no JVD Endocrine:  No thryomegaly Vascular: Bilateral carotid bruits; FA pulses 2+ bilaterally  Cardiac: Irregularly irregular no murmur Lungs: Diminished breath sounds bilaterally Abd: soft, nontender, no hepatomegaly  Ext: Trace bilateral pretibial edema Musculoskeletal:  No deformities, BUE and BLE strength normal and equal Skin: warm and dry  Neuro:  CNs 2-12 intact, no focal abnormalities noted Psych:  Normal affect   EKG:  The EKG was personally reviewed and demonstrates:  Atrial fibrillation with RVR, ST-T wave change consider anterolateral ischemia  Telemetry:  Telemetry was personally reviewed and  demonstrates: Atrial fibrillation heart rate 100 bpm  Relevant CV Studies: Echo: formal interpretation pending. My review of images shows normal LV function and no significant valvular abnormality, LVEF approximately 55-60%.  Laboratory Data:  High Sensitivity Troponin:   Recent Labs  Lab 09/21/19 0204 09/21/19 0528  TROPONINIHS 759* 679*     Chemistry Recent Labs  Lab 09/20/19 1621 09/21/19 0204 09/21/19 0528 09/21/19 1542  NA 137 139  --  140  K 4.0 4.5  --  4.2  CL 104 107  --  102  CO2 17* 20*  --  25  GLUCOSE 173* 159*  --  218*  BUN 27* 30*  --  32*  CREATININE 2.37* 1.72* 1.63* 1.37*  CALCIUM 8.8* 8.1*  --  8.2*  GFRNONAA 20* 29* 31* 38*  GFRAA 23* 34* 36* 44*  ANIONGAP 16* 12  --  13    Recent Labs  Lab 09/20/19 1621 09/21/19 1542  PROT 5.9* 5.9*  ALBUMIN 2.6* 2.9*  AST 98* 37  ALT 69* 44  ALKPHOS 83 86  BILITOT 1.1 0.8   Hematology Recent Labs  Lab 09/20/19 1621 09/21/19 0204 09/21/19 0528  WBC 31.0* 32.9* 30.7*  RBC 3.88 3.43* 3.49*  HGB 11.2* 10.2* 10.2*  HCT 35.0* 30.4* 30.7*  MCV 90.2 88.6 88.0  MCH 28.9 29.7 29.2  MCHC 32.0 33.6 33.2  RDW 13.9 14.2 14.2  PLT 314 262 272   BNP Recent Labs  Lab 09/21/19 0204  BNP 457.2*    DDimer No results for input(s): DDIMER in the last 168 hours.   Radiology/Studies:  US Abdomen Complete  Result Date: 09/21/2019 CLINICAL DATA:  Elevated LFTs, BUN and creatinine, history of cholecystectomy EXAM: ABDOMEN ULTRASOUND COMPLETE COMPARISON:  PET-CT 10/18/2010 FINDINGS: Gallbladder: Surgically absent Common bile duct: Diameter: 7.9 mm proximally, 8.4 mm distally, likely within normal limits for patient age and post cholecystectomy state. Liver: Parenchymal echogenicity is within normal limits. Slightly nodular hepatic surface contour. No focal liver lesions are identified. Portal vein is patent on color Doppler imaging with normal direction of blood flow towards the liver. IVC: No abnormality visualized.  Pancreas: Visualized portion unremarkable. Portions of the tail and body are obscured by bowel gas. Spleen: Size and appearance within normal limits. Right Kidney: 11.4 x 5.3 x 5.4 cm (volume 170 mL). Echogenicity within normal limits. No mass or hydronephrosis visualized. Left Kidney: 11.0 x 6.1 x 5.4 cm (volume 190 mL). Echogenicity within normal limits. No mass or hydronephrosis visualized. Abdominal aorta: No proximal aneurysm identified. Mid to distal aorta is poorly visualized due to bowel gas. Other findings: None. IMPRESSION: Patient is post cholecystectomy. Prominence of the biliary tree may be a combination of senescent change and post cholecystectomy reservoir effect. Slightly nodular hepatic surface contour, can be seen in the setting of intrinsic liver disease including potential cirrhosis. Correlate with serologies. Electronically Signed   By: [name] M.D.   On: 09/21/2019 03:18   DG Chest Port 1 View  Result Date: 09/20/2019 CLINICAL DATA:  Lethargy, possible sepsis EXAM: PORTABLE CHEST 1 VIEW COMPARISON:  06/29/2019 FINDINGS: Cardiomegaly. Low volume AP portable examination with mild, diffuse bilateral interstitial opacity. The visualized skeletal structures are unremarkable. IMPRESSION: Cardiomegaly. There is mild, diffuse bilateral interstitial  opacity, likely mild edema although exaggerated by low volume AP portable technique. No focal airspace opacity. Electronically Signed   By: Eddie Candle M.D.   On: 09/20/2019 16:54      Assessment and Plan:   1. Atrial fibrillation with RVR: Unknown duration of atrial fibrillation.  Difficult to know whether atrial fib was precipitated by urosepsis or whether she had this prior to getting sick.  She is now appropriately treated with apixaban.  I think her blood pressure is stable enough that we can start her on a low-dose of diltiazem.  I would be inclined to start with a strategy of heart rate control and anticoagulation.  We could consider  elective cardioversion after 3 weeks of therapeutic anticoagulation if she remains in atrial fibrillation. 2. Elevated troponin consistent with demand ischemia (AF with RVR and sepsis).  The patient has no ischemic symptoms and I would not recommend any further evaluation of her mildly elevated troponin at this time. 3. AKI, creatinine trending down now 1.37 mg/dL from 2.37  For questions or updates, please contact [place] Please consult [email or web address] for contact info under     Signed, Sherren Mocha, MD  09/22/2019 6:08 PM

## 2019-09-22 NOTE — Progress Notes (Signed)
PROGRESS NOTE  Debbie [name] [id number] DOB: Jun 06, 1946 DOA: 09/20/2019 PCP: [name], MD  Brief Narrative:   History of A. Fib (was not on anticoagulation at home, Eliquis was started here), IDDM2,COPD, pulmonary hypertension, On O2 at 3 L [place] at home   Admitted 09/20/2019 by critical care: Presented to ED with complaints of 1 day of increased lethargy, per EMS recent UTI with just finished 10-day course of nitrofurantoin from PCP 3 days prior. Was hypotensive tachypneic, elevated WBC, elevated creatinine, positive urine leukocytes and nitrites.  A. fib on EKG.   Was on Levophed 2 mcg gtt., critical care was consulted, discontinued this.  Anion gap metabolic acidosis.  Critical care recommended stay on bicarb drip until AG closes, lactic acidosis/Metformin use likely cause.  Advised albumin and hydrocortisone, hold ARB, check TSH, mag, phos.  IV fluids tapered down given questionable congestion on chest x-ray findings. PCCM weaned off pressors and pt appears stable, was transitioned to [facility] 09/21/19      HPI/Recap of past 24 hours:  Feeling better, confusion has resolved, blood pressure improved, remain in afib, rate better controlled  Assessment/Plan: Principal Problem:   Sepsis ([place]) Active Problems:   Acute lower UTI   Atrial fibrillation with RVR ([place])   Septic shock (HCC)   Hypotension  Sepsis /septic shock presents on admission secondary to acute lower UTI, aki, acute metabolic encephalopathy -He was started on antibiotic by PCP, however she did not improve, she became disoriented and hypotensive, she was brought to the ED, she was briefly on levophed for 2hrs in the ED, she was subsequently off pressor -Started on Rocephin, and stress dose steroid in the ED as well -She improved, blood culture no growth, urine culture with multiple species -We will continue Rocephin for now, Taper stress dose steroid  AKI No hydronephrosis on renal ultrasound Improving Renal  dosing meds  New diagnosis of A. Fib -TSH 1.1 -She is started on Eliquis since hospitalization -Echo pending -consult cardiology  Hypomagnesemia Mag 0.9 on presentation, reports poor oral intake at home Replace magnesium IV 4 g today Repeat lab in the morning  Transaminitis, likely from sepsis -Abdominal ultrasound "post cholecystectomy. Prominence of the biliary tree may be a combination of senescent change and post cholecystectomy reservoir effect.  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  DVT Prophylaxis: eliquis  Code Status: Full  Family Communication: patient   Disposition Plan: Not ready to discharge, will likely benefit from home health when medically ready   Consultants: Critical care, signed off  Cardiology  Procedures:  None  Antibiotics:  Rocephin   Objective: BP 123/80 (BP Location: Right Arm)   Pulse 92   Temp 97.7 F (36.5 C) (Oral)   Resp 17   Ht 5\' 9"  (1.753 m)   Wt 95 kg   SpO2 96%   BMI 30.93 kg/m   Intake/Output Summary (Last 24 hours) at 09/22/2019 1714 Last data filed at 09/22/2019 0812 Gross per 24 hour  Intake 1366.77 ml  Output 450  ml  Net 916.77 ml   Filed Weights   09/21/19 2100  Weight: 95 kg    Exam: Patient is examined daily including today on 09/22/2019, exams remain the same as of yesterday except that has changed    General:  NAD  Cardiovascular: IRRR  Respiratory: CTABL  Abdomen: Soft/ND/NT, positive  BS  Musculoskeletal: No Edema, status post right BKA  Neuro: alert, oriented   Data Reviewed: Basic Metabolic Panel: Recent Labs  Lab 09/20/19 1621 09/21/19 0204 09/21/19 0528 09/21/19 1542  NA 137 139  --  140  K 4.0 4.5  --  4.2  CL 104 107  --  102  CO2 17* 20*  --  25  GLUCOSE 173* 159*  --  218*  BUN 27* 30*  --  32*  CREATININE 2.37* 1.72* 1.63* 1.37*  CALCIUM 8.8* 8.1*  --  8.2*  MG  --  0.9*  --   --   PHOS  --  4.5  --   --    Liver Function Tests: Recent Labs  Lab 09/20/19 1621 09/21/19 1542  AST 98* 37  ALT 69* 44  ALKPHOS 83 86  BILITOT 1.1 0.8  PROT 5.9* 5.9*  ALBUMIN 2.6* 2.9*   No results for input(s): LIPASE, AMYLASE in the last 168 hours. No results for input(s): AMMONIA in the last 168 hours. CBC: Recent Labs  Lab 09/20/19 1621 09/21/19 0204 09/21/19 0528  WBC 31.0* 32.9* 30.7*  NEUTROABS 29.1*  --   --   HGB 11.2* 10.2* 10.2*  HCT 35.0* 30.4* 30.7*  MCV 90.2 88.6 88.0  PLT 314 262 272   Cardiac Enzymes:   Recent Labs  Lab 09/21/19 0528  CKTOTAL 36*  CKMB 9.2*   BNP (last 3 results) Recent Labs    09/21/19 0204  BNP 457.2*    ProBNP (last 3 results) No results for input(s): PROBNP in the last 8760 hours.  CBG: Recent Labs  Lab 09/21/19 1811 09/21/19 2120 09/22/19 0620 09/22/19 1100 09/22/19 1610  GLUCAP 177* 229* 198* 273* 281*    Recent Results (from the past 240 hour(s))  Blood Culture (routine x 2)     Status: None (Preliminary result)   Collection Time: 09/20/19  4:05 PM   Specimen: BLOOD  Result Value Ref Range Status   Specimen Description BLOOD LEFT ANTECUBITAL  Final   Special Requests   Final    BOTTLES DRAWN AEROBIC AND ANAEROBIC Blood Culture results may not be optimal due to an inadequate volume of blood received in culture bottles   Culture   Final    NO GROWTH 2 DAYS Performed at [facility], [place] [address] [place], [place] [address]    Report Status PENDING  Incomplete  Blood Culture  (routine x 2)     Status: None (Preliminary result)   Collection Time: 09/20/19  4:18 PM   Specimen: BLOOD LEFT FOREARM  Result Value Ref Range Status   Specimen Description BLOOD LEFT FOREARM  Final   Special Requests   Final    BOTTLES DRAWN AEROBIC AND ANAEROBIC Blood Culture results may not be optimal due to an inadequate volume of blood received in culture bottles   Culture   Final    NO GROWTH 2 DAYS Performed at [facility], [place] [address] [place], [place] [address]    Report Status PENDING  Incomplete  Respiratory Panel by RT PCR (Flu A&B, Covid) - Nasopharyngeal Swab     Status:  None   Collection Time: 09/20/19  5:50 PM   Specimen: Nasopharyngeal Swab  Result Value Ref Range Status   SARS Coronavirus 2 by RT PCR NEGATIVE NEGATIVE Final    Comment: (NOTE) SARS-CoV-2 target nucleic acids are NOT DETECTED. The SARS-CoV-2 RNA is generally detectable in upper respiratoy specimens during the acute phase of infection. The lowest concentration of SARS-CoV-2 viral copies this assay can detect is 131 copies/mL. A negative result does not preclude SARS-Cov-2 infection and should not be used as the sole basis for treatment or other patient management decisions. A negative result may occur with  improper specimen collection/handling, submission of specimen other than nasopharyngeal swab, presence of viral mutation(s) within the areas targeted by this assay, and inadequate number of viral copies (<131 copies/mL). A negative result must be combined with clinical observations, patient history, and epidemiological information. The expected result is Negative. Fact Sheet for Patients:  PinkCheek.be Fact Sheet for Healthcare Providers:  GravelBags.it This test is not yet ap proved or cleared by the [place] FDA and  has been authorized for detection and/or diagnosis of SARS-CoV-2 by FDA under an Emergency Use  Authorization (EUA). This EUA will remain  in effect (meaning this test can be used) for the duration of the COVID-19 declaration under Section 564(b)(1) of the Act, 21 U.S.C. section 360bbb-3(b)(1), unless the authorization is terminated or revoked sooner.    Influenza A by PCR NEGATIVE NEGATIVE Final   Influenza B by PCR NEGATIVE NEGATIVE Final    Comment: (NOTE) The Xpert Xpress SARS-CoV-2/FLU/RSV assay is intended as an aid in  the diagnosis of influenza from Nasopharyngeal swab specimens and  should not be used as a sole basis for treatment. Nasal washings and  aspirates are unacceptable for Xpert Xpress SARS-CoV-2/FLU/RSV  testing. Fact Sheet for Patients: PinkCheek.be Fact Sheet for Healthcare Providers: GravelBags.it This test is not yet approved or cleared by the [place] FDA and  has been authorized for detection and/or diagnosis of SARS-CoV-2 by  FDA under an Emergency Use Authorization (EUA). This EUA will remain  in effect (meaning this test can be used) for the duration of the  Covid-19 declaration under Section 564(b)(1) of the Act, 21  U.S.C. section 360bbb-3(b)(1), unless the authorization is  terminated or revoked. Performed at [facility], [place] [address] [place], [place] [address]   Urine culture     Status: Abnormal   Collection Time: 09/21/19  5:28 AM   Specimen: In/Out Cath Urine  Result Value Ref Range Status   Specimen Description IN/OUT CATH URINE  Final   Special Requests   Final    NONE Performed at [facility], [place] [address], [place], [place] [address]    Culture MULTIPLE SPECIES PRESENT, SUGGEST RECOLLECTION (A)  Final   Report Status 09/22/2019 FINAL  Final     Studies: No results found.  Scheduled Meds: . apixaban  5 mg Oral BID  . feeding supplement (GLUCERNA SHAKE)  237 mL Oral TID BM  . gabapentin  100 mg Oral TID  . [START ON 09/23/2019] hydrocortisone sod  succinate (SOLU-CORTEF) inj  100 mg Intravenous Q12H  . insulin aspart  0-5 Units Subcutaneous QHS  . insulin aspart  0-9 Units Subcutaneous TID WC  . loratadine  10 mg Oral Daily  . multivitamin with minerals  1 tablet Oral Daily  . pantoprazole  40 mg Oral QHS    Continuous Infusions: . sodium chloride Stopped (09/20/19 2317)  . cefTRIAXone (ROCEPHIN)  IV 1 g (09/22/19 1654)  . sodium bicarbonate 150 mEq in dextrose 5% 1000 mL 150 mEq (09/21/19 2012)     Time spent: 73mins I have personally reviewed and interpreted on  09/22/2019 daily labs, tele strips, imagings as discussed above under date review session and assessment and plans.  I reviewed all nursing notes, pharmacy notes, consultant notes,  vitals, pertinent old records  I have discussed plan of care as described above with RN , patient on 09/22/2019   Florencia Reasons MD, PhD, FACP  [facility] Pager [phone]. If 7PM-7AM, please contact night-coverage at [email or web address], password [facility] 09/22/2019, 5:14 PM  LOS: 2 days

## 2019-09-22 NOTE — Progress Notes (Signed)
  Echocardiogram 2D Echocardiogram has been performed.  Debbie [name] 09/22/2019, 3:12 PM

## 2019-09-22 NOTE — Progress Notes (Signed)
Initial Nutrition Assessment  DOCUMENTATION CODES:   Not applicable  INTERVENTION:    Glucerna Shake po TID, each supplement provides 220 kcal and 10 grams of protein  MVI daily   NUTRITION DIAGNOSIS:   Increased nutrient needs related to acute illness as evidenced by estimated needs.  GOAL:   Patient will meet greater than or equal to 90% of their needs  MONITOR:   PO intake, Supplement acceptance, Weight trends, Labs, I & O's  REASON FOR ASSESSMENT:   Malnutrition Screening Tool    ASSESSMENT:   Patient with PMH significant for HTN, HLD, DM, COPD, PAD s/p R BKA. Presents this admission with acute lower UTI and hypotension.   Pt reports having a loss of appetite 3-4 days PTA due to feeling poorly. States during this time she would take a couple bites from meals but didn't have much else. Prior to this she consumed 2 meals daily that consisted of meat, vegetable, and grain. Does not typically eat breakfast. Discussed the importance of protein intake for preservation of lean body mass. Pt willing to try Glucerna. Meal completion charted as 100% for breakfast this am.   Pt endorses a UBW of 200-203 lb and an unintentional wt loss of 3 lb within the last week. Records indicate pt weighed 94.3 kg on 10/13 and 95 kg this admission.   I/O: +5,017 ml since admit UOP: 450 ml x 24 hrs   Medications: solumedrol, SS novolog Labs: CBG [id number]  NUTRITION - FOCUSED PHYSICAL EXAM:    Most Recent Value  Orbital Region  No depletion  Upper Arm Region  No depletion  Thoracic and Lumbar Region  No depletion  Buccal Region  No depletion  Temple Region  No depletion  Clavicle Bone Region  No depletion  Clavicle and Acromion Bone Region  No depletion  Scapular Bone Region  No depletion  Dorsal Hand  No depletion  Patellar Region  No depletion  Anterior Thigh Region  No depletion  Posterior Calf Region  No depletion  Edema (RD Assessment)  None  Hair  Reviewed  Eyes  Reviewed   Mouth  Reviewed  Skin  Reviewed  Nails  Reviewed     Diet Order:   Diet Order            Diet heart healthy/carb modified Room service appropriate? Yes with Assist; Fluid consistency: Thin  Diet effective now              EDUCATION NEEDS:   Education needs have been addressed  Skin:  Skin Assessment: Reviewed RN Assessment  Last BM:  1/5  Height:   Ht Readings from Last 1 Encounters:  09/21/19 5\' 9"  (1.753 m)    Weight:   Wt Readings from Last 1 Encounters:  09/21/19 95 kg    Ideal Body Weight:  61.6 kg(adjusted R BKA)  BMI:  Body mass index is 30.93 kg/m.  Estimated Nutritional Needs:   Kcal:  1750-1950 kcal  Protein:  85-100 grams  Fluid:  >/= 1.7 L/day   Mariana Single RD, LDN Clinical Nutrition Pager # - [phone]

## 2019-09-23 DIAGNOSIS — I5031 Acute diastolic (congestive) heart failure: Secondary | ICD-10-CM

## 2019-09-23 LAB — CBC WITH DIFFERENTIAL/PLATELET
Abs Immature Granulocytes: 0.22 10*3/uL — ABNORMAL HIGH (ref 0.00–0.07)
Basophils Absolute: 0 10*3/uL (ref 0.0–0.1)
Basophils Relative: 0 %
Eosinophils Absolute: 0 10*3/uL (ref 0.0–0.5)
Eosinophils Relative: 0 %
HCT: 29.3 % — ABNORMAL LOW (ref 36.0–46.0)
Hemoglobin: 9.7 g/dL — ABNORMAL LOW (ref 12.0–15.0)
Immature Granulocytes: 1 %
Lymphocytes Relative: 10 %
Lymphs Abs: 2.3 10*3/uL (ref 0.7–4.0)
MCH: 29.2 pg (ref 26.0–34.0)
MCHC: 33.1 g/dL (ref 30.0–36.0)
MCV: 88.3 fL (ref 80.0–100.0)
Monocytes Absolute: 0.7 10*3/uL (ref 0.1–1.0)
Monocytes Relative: 3 %
Neutro Abs: 19.4 10*3/uL — ABNORMAL HIGH (ref 1.7–7.7)
Neutrophils Relative %: 86 %
Platelets: 247 10*3/uL (ref 150–400)
RBC: 3.32 MIL/uL — ABNORMAL LOW (ref 3.87–5.11)
RDW: 13.9 % (ref 11.5–15.5)
WBC: 22.6 10*3/uL — ABNORMAL HIGH (ref 4.0–10.5)
nRBC: 0 % (ref 0.0–0.2)

## 2019-09-23 LAB — BASIC METABOLIC PANEL
Anion gap: 11 (ref 5–15)
BUN: 28 mg/dL — ABNORMAL HIGH (ref 8–23)
CO2: 34 mmol/L — ABNORMAL HIGH (ref 22–32)
Calcium: 8.5 mg/dL — ABNORMAL LOW (ref 8.9–10.3)
Chloride: 98 mmol/L (ref 98–111)
Creatinine, Ser: 0.88 mg/dL (ref 0.44–1.00)
GFR calc Af Amer: >60 mL/min (ref >60)
GFR calc non Af Amer: >60 mL/min (ref >60)
Glucose, Bld: 214 mg/dL — ABNORMAL HIGH (ref 70–99)
Potassium: 3.8 mmol/L (ref 3.5–5.1)
Sodium: 143 mmol/L (ref 135–145)

## 2019-09-23 LAB — GLUCOSE, CAPILLARY
Glucose-Capillary: 196 mg/dL — ABNORMAL HIGH (ref 70–99)
Glucose-Capillary: 257 mg/dL — ABNORMAL HIGH (ref 70–99)
Glucose-Capillary: 261 mg/dL — ABNORMAL HIGH (ref 70–99)

## 2019-09-23 LAB — MAGNESIUM: Magnesium: 2 mg/dL (ref 1.7–2.4)

## 2019-09-23 LAB — CYTOLOGY - NON PAP

## 2019-09-23 MED ORDER — FUROSEMIDE 10 MG/ML IJ SOLN
40.0000 mg | Freq: Once | INTRAMUSCULAR | Status: AC
  Administered 2019-09-23: 17:00:00 40 mg via INTRAVENOUS
  Filled 2019-09-23: qty 4

## 2019-09-23 MED ORDER — POTASSIUM CHLORIDE CRYS ER 20 MEQ PO TBCR
40.0000 meq | EXTENDED_RELEASE_TABLET | Freq: Once | ORAL | Status: AC
  Administered 2019-09-23: 17:00:00 40 meq via ORAL
  Filled 2019-09-23: qty 2

## 2019-09-23 MED ORDER — FUROSEMIDE 10 MG/ML IJ SOLN
40.0000 mg | Freq: Once | INTRAMUSCULAR | Status: AC
  Administered 2019-09-23: 40 mg via INTRAVENOUS
  Filled 2019-09-23: qty 4

## 2019-09-23 MED ORDER — CEPHALEXIN 500 MG PO CAPS
500.0000 mg | ORAL_CAPSULE | Freq: Two times a day (BID) | ORAL | Status: DC
  Administered 2019-09-23 – 2019-09-24 (×3): 500 mg via ORAL
  Filled 2019-09-23 (×3): qty 1

## 2019-09-23 MED ORDER — ALUM & MAG HYDROXIDE-SIMETH 200-200-20 MG/5ML PO SUSP
30.0000 mL | ORAL | Status: DC | PRN
  Administered 2019-09-23: 30 mL via ORAL
  Filled 2019-09-23: qty 30

## 2019-09-23 MED ORDER — POTASSIUM CHLORIDE CRYS ER 20 MEQ PO TBCR
40.0000 meq | EXTENDED_RELEASE_TABLET | Freq: Once | ORAL | Status: AC
  Administered 2019-09-23: 40 meq via ORAL
  Filled 2019-09-23: qty 2

## 2019-09-23 NOTE — Evaluation (Signed)
Physical Therapy Evaluation Patient Details Name: Debbie [name] MRN: [id number] DOB: 10-May-1946 Today's Date: 09/23/2019   History of Present Illness  74 y.o.female with history of A. Fib (was not on anticoagulation at home, Eliquis was started here), IDDM2,COPD, pulmonary hypertension, R BKA, DM, On O2 at 3 L [place] at home. Pt admitted with sepsis 2/2 UTI.  Clinical Impression  Pt presents to PT with deficits in strength, power, functional mobility, and cardiopulmonary function. PT evaluation limited due to LE edema, preventing pt from having a good fit of her prosthetic. Pt is able to perform OOB transfers with minA and hand held assist at this time, but will not be able to ambulate until prosthetic fits correctly. PT anticipates LE swelling will continue to decrease with medical management and pt's prosthetic may fit by tomorrow. Pt will benefit from continued acute PT services to improve activity tolerance and aide in a return to her baseline.     Follow Up Recommendations Home health PT;Supervision/Assistance - 24 hour    Equipment Recommendations  None recommended by PT(pt owns necessary DME)    Recommendations for Other Services       Precautions / Restrictions Precautions Precautions: Fall Restrictions Weight Bearing Restrictions: No Other Position/Activity Restrictions: old R BKA      Mobility  Bed Mobility Overal bed mobility: Needs Assistance Bed Mobility: Supine to Sit     Supine to sit: Supervision;HOB elevated        Transfers Overall transfer level: Needs assistance Equipment used: 1 person hand held assist Transfers: Stand Pivot Transfers   Stand pivot transfers: Min assist          Ambulation/Gait Ambulation/Gait assistance: (pt declined gait due to prosthetic not fitting 2/2 LE edema)              Stairs            Wheelchair Mobility    Modified Rankin (Stroke Patients Only)       Balance Overall balance assessment: Needs  assistance Sitting-balance support: No upper extremity supported;Feet supported Sitting balance-Leahy Scale: Good Sitting balance - Comments: supervision   Standing balance support: Bilateral upper extremity supported Standing balance-Leahy Scale: Fair Standing balance comment: minG with hand hold and support of recliner                             Pertinent Vitals/Pain Pain Assessment: No/denies pain    Home Living Family/patient expects to be discharged to:: Private residence Living Arrangements: Spouse/significant other Available Help at Discharge: Family;Available 24 hours/day(minA due to recent sepsis) Type of Home: House Home Access: Ramped entrance     Home Layout: One level Home Equipment: Cane - single point;Youth worker - 2 wheels;Bedside commode      Prior Function Level of Independence: Independent with assistive device(s)         Comments: short household ambulator with use of cane. Pt utilizies Transport planner for longer distances. Pt reports being limited by breathing.     Hand Dominance        Extremity/Trunk Assessment   Upper Extremity Assessment Upper Extremity Assessment: Overall WFL for tasks assessed    Lower Extremity Assessment Lower Extremity Assessment: Generalized weakness    Cervical / Trunk Assessment Cervical / Trunk Assessment: Normal  Communication   Communication: No difficulties  Cognition Arousal/Alertness: Awake/alert Behavior During Therapy: WFL for tasks assessed/performed Overall Cognitive Status: Within Functional Limits for tasks assessed  General Comments General comments (skin integrity, edema, etc.): VSS on 4L [place]    Exercises     Assessment/Plan    PT Assessment Patient needs continued PT services  PT Problem List Decreased strength;Decreased balance;Decreased activity tolerance;Decreased mobility;Cardiopulmonary status limiting  activity       PT Treatment Interventions DME instruction;Gait training;Functional mobility training;Therapeutic activities;Therapeutic exercise;Balance training;Neuromuscular re-education;Patient/family education    PT Goals (Current goals can be found in the Care Plan section)  Acute Rehab PT Goals Patient Stated Goal: To return to baseline PT Goal Formulation: With patient Time For Goal Achievement: 10/07/19 Potential to Achieve Goals: Good    Frequency Min 3X/week   Barriers to discharge        Co-evaluation               AM-PAC PT "6 Clicks" Mobility  Outcome Measure Help needed turning from your back to your side while in a flat bed without using bedrails?: None Help needed moving from lying on your back to sitting on the side of a flat bed without using bedrails?: None Help needed moving to and from a bed to a chair (including a wheelchair)?: A Little Help needed standing up from a chair using your arms (e.g., wheelchair or bedside chair)?: A Little Help needed to walk in hospital room?: A Little Help needed climbing 3-5 steps with a railing? : Total 6 Click Score: 18    End of Session Equipment Utilized During Treatment: Gait belt;Oxygen Activity Tolerance: Patient tolerated treatment well Patient left: in chair;with call bell/phone within reach Nurse Communication: Mobility status PT Visit Diagnosis: Other abnormalities of gait and mobility (R26.89)    Time: [id number] PT Time Calculation (min) (ACUTE ONLY): 27 min   Charges:   PT Evaluation $PT Eval Moderate Complexity: 1 Mod          Zenaida Niece, PT, DPT Acute Rehabilitation Pager: [phone]   Zenaida Niece 09/23/2019, 3:00 PM

## 2019-09-23 NOTE — TOC Benefit Eligibility Note (Signed)
Transition of Care [facility]) Benefit Eligibility Note    Patient Details  Name: Debbie [name] MRN: [id number] Date of Birth: 19-Feb-1946   Medication/Dose: Eliquis 5mg   Covered?: Yes  Tier: 3 Drug  Prescription Coverage [facility]erred [facility]acy: [facility]  Spoke with Person/Company/Phone Number:: Thurmond Butts / [facility] [employer] / [phone]  Co-Pay: 4.00 for a 30 day supply retail and 90 day mail order  Prior Approval: No  Deductible: Unmet(330.00)       Orbie Pyo Phone Number: 09/23/2019, 12:38 PM

## 2019-09-23 NOTE — TOC Initial Note (Addendum)
Transition of Care (TOC) - Initial/Assessment Note  Marvetta Gibbons RN, BSN Transitions of Care Unit 4E- RN Case Manager [phone]   Patient Details  Name: Debbie [name] MRN: [id number] Date of Birth: June 28, 1946  Transition of Care [facility]) CM/SW Contact:    Dawayne Patricia, RN Phone Number: 09/23/2019, 4:45 PM  Clinical Narrative:                 Pt admitted with UTI sepsis, referral received for [facility] and DME needs- CM spoke with pt at bedside- per pt she lives at home with spouse- has home 02 baseline 3L through [place]- has needed DME at home included walker, cane and BSC. - no further DME needs noted. Pt provided list for [facility] choice Per CMS guidelines from medicare.gov website with star ratings (copy placed in shadow chart)- per  Pt she would like to use [facility] for [facility] services- call made to Butch Penny with [facility] for [facility] referral- referral has been accepted. Address, phone # and PCP all confirmed with pt in epic. Pt has been started on Eliquis- benefits check completed- copay $4- pt informed- will need 30 day coupon if initial script non sent to [facility] to fill free 30 day. CM to f/u in am.   Expected Discharge Plan: [place] Barriers to Discharge: Continued Medical Work up   Patient Goals and CMS Choice Patient states their goals for this hospitalization and ongoing recovery are:: return home CMS Medicare.gov Compare Post Acute Care list provided to:: Patient Choice offered to / list presented to : Patient  Expected Discharge Plan and Services Expected Discharge Plan: [place]   Discharge Planning Services: CM Consult Post Acute Care Choice: [place] arrangements for the past 2 months: Single Family Home                 DME Arranged: N/A DME Agency: NA       HH Arranged: RN, PT [place] Agency: [place] ([place]) Date HH Agency Contacted: 09/23/19 Time [place]: 1600 Representative spoke with at [place]:  Butch Penny  Prior Living Arrangements/Services Living arrangements for the past 2 months: [place] with:: Self, Spouse Patient language and need for interpreter reviewed:: Yes Do you feel safe going back to the place where you live?: Yes      Need for Family Participation in Patient Care: Yes (Comment) Care giver support system in place?: Yes (comment) Current home services: DME Criminal Activity/Legal Involvement Pertinent to Current Situation/Hospitalization: No - Comment as needed  Activities of Daily Living Home Assistive Devices/Equipment: Cane (specify quad or straight), Oxygen, Other (Comment)(3L [place] 24/7   PROSTETIC RIGHT LEG) ADL Screening (condition at time of admission) Patient's cognitive ability adequate to safely complete daily activities?: No Is the patient deaf or have difficulty hearing?: No Does the patient have difficulty seeing, even when wearing glasses/contacts?: No Does the patient have difficulty concentrating, remembering, or making decisions?: No Patient able to express need for assistance with ADLs?: Yes Does the patient have difficulty dressing or bathing?: No Independently performs ADLs?: Yes (appropriate for developmental age) Does the patient have difficulty walking or climbing stairs?: Yes Weakness of Legs: Left(RIGHT BKA) Weakness of Arms/Hands: None  Permission Sought/Granted Permission sought to share information with : Facility Arts administrator granted to share info w AGENCY: Advanced        Emotional Assessment Appearance:: Appears stated age Attitude/Demeanor/Rapport: Engaged  Affect (typically observed): Appropriate, Pleasant Orientation: : Oriented to Self, Oriented to Place, Oriented to  Time, Oriented to Situation   Psych Involvement: No (comment)  Admission diagnosis:  Lactic acidosis [E87.2] ARF (acute renal failure) (HCC) [N17.9] Abnormal liver function [R94.5] AKI (acute kidney injury) ([place])  [N17.9] Septic shock (HCC) [A41.9, R65.21] Sepsis ([place]) [A41.9] Urinary tract infection without hematuria, site unspecified [N39.0] Sepsis with acute renal failure and septic shock, due to unspecified organism, unspecified acute renal failure type ([place]) [A41.9, R65.21, N17.9] Patient Active Problem List   Diagnosis Date Noted  . Hypotension 09/21/2019  . AKI (acute kidney injury) ([place])   . Sepsis ([place]) 09/20/2019  . Acute lower UTI 09/20/2019  . Atrial fibrillation with RVR ([place]) 09/20/2019  . Septic shock ([place]) 09/20/2019  . 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  . Pulmonary hypertension ([place]) 03/28/2017  . Morbid obesity due to excess calories ([place]) 09/29/2015  . OSA (obstructive sleep apnea) 09/23/2014  . Atherosclerosis of native arteries of extremity with intermittent claudication ([place]) 08/05/2014  . Chronic respiratory failure with hypoxia ([place]) 04/25/2014  . DOE (dyspnea on exertion) 04/01/2014  . Encounter for colonoscopy due to history of adenomatous colonic polyps 02/28/2012  . GERD (gastroesophageal reflux disease) 02/28/2012  . DM type 2 (diabetes mellitus, type 2) ([place])   . Hyperlipidemia   . Essential hypertension    PCP:  [name], MD Pharmacy:   [id number] - [place], [place] - [address] [place] [address] Phone: [phone] Fax: [phone]     Social Determinants of Health (SDOH) Interventions    Readmission Risk Interventions Readmission Risk Prevention Plan 09/23/2019  Transportation Screening Complete  PCP or Specialist Appt within 5-7 Days Complete  Home Care Screening Complete  Medication Review (RN CM) Complete  Some recent data might be hidden

## 2019-09-23 NOTE — Progress Notes (Addendum)
Patient given discharge instructions, medication list and follow up appointments. patient verbalized understanding. Patient educated on drain care, and emptying drain. Patient verbalized understanding. All questions answered IV and tele were dcd. Patient to be transported to exit via wheel chair and nursing staff. Will discharge home as ordered. Tiegan Jambor, Bettina Gavia RN

## 2019-09-23 NOTE — Progress Notes (Signed)
Progress Note  Patient Name: Debbie [name] Date of Encounter: 09/23/2019  Primary Cardiologist: No primary care provider on file.   Subjective   Could not lie flat overnight because of shortness of breath.  Was awakened with shortness of breath at about 3 AM.  Otherwise feeling a little better this morning.  Still short of breath at rest.  No chest pain.  Inpatient Medications    Scheduled Meds: . apixaban  5 mg Oral BID  . diltiazem  120 mg Oral Daily  . feeding supplement (GLUCERNA SHAKE)  237 mL Oral TID BM  . furosemide  40 mg Intravenous Once  . gabapentin  100 mg Oral TID  . hydrocortisone sod succinate (SOLU-CORTEF) inj  100 mg Intravenous Q12H  . insulin aspart  0-5 Units Subcutaneous QHS  . insulin aspart  0-9 Units Subcutaneous TID WC  . loratadine  10 mg Oral Daily  . multivitamin with minerals  1 tablet Oral Daily  . pantoprazole  40 mg Oral QHS  . potassium chloride  40 mEq Oral Once   Continuous Infusions: . sodium chloride Stopped (09/20/19 2317)  . cefTRIAXone (ROCEPHIN)  IV 1 g (09/22/19 1654)   PRN Meds: alum & mag hydroxide-simeth   Vital Signs    Vitals:   09/23/19 0022 09/23/19 0100 09/23/19 0328 09/23/19 0758  BP: (!) 145/87 138/82 (!) 147/80 (!) 145/88  Pulse: 67 (!) 54 91 78  Resp: 20 16 (!) 24 14  Temp: 97.6 F (36.4 C) 97.7 F (36.5 C) 97.6 F (36.4 C) 97.7 F (36.5 C)  TempSrc: Oral Oral Oral Oral  SpO2: 99% 97% 98% 99%  Weight:   96.9 kg   Height:        Intake/Output Summary (Last 24 hours) at 09/23/2019 0930 Last data filed at 09/23/2019 0844 Gross per 24 hour  Intake 1413.33 ml  Output 702 ml  Net 711.33 ml   Last 3 Weights 09/23/2019 09/21/2019 06/29/2019  Weight (lbs) 213 lb 10 oz 209 lb 7 oz 208 lb  Weight (kg) 96.9 kg 95 kg 94.348 kg      Telemetry    Atrial fibrillation heart rate 80 to 110 bpm- Personally Reviewed   Physical Exam  Alert, oriented, elderly woman in no distress GEN: No acute distress.   Neck: No  JVD Cardiac:  Irregularly irregular, no murmurs, rubs, or gallops.  Respiratory: Clear to auscultation bilaterally. GI: Soft, nontender, non-distended  MS:  Mild edema left pretibial area, mild edema right amputation stump Neuro:  Nonfocal  Psych: Normal affect   Labs    High Sensitivity Troponin:   Recent Labs  Lab 09/21/19 0204 09/21/19 0528  TROPONINIHS 759* 679*      Chemistry Recent Labs  Lab 09/20/19 1621 09/21/19 0204 09/21/19 0528 09/21/19 1542 09/23/19 0310  NA 137 139  --  140 143  K 4.0 4.5  --  4.2 3.8  CL 104 107  --  102 98  CO2 17* 20*  --  25 34*  GLUCOSE 173* 159*  --  218* 214*  BUN 27* 30*  --  32* 28*  CREATININE 2.37* 1.72* 1.63* 1.37* 0.88  CALCIUM 8.8* 8.1*  --  8.2* 8.5*  PROT 5.9*  --   --  5.9*  --   ALBUMIN 2.6*  --   --  2.9*  --   AST 98*  --   --  37  --   ALT 69*  --   --  44  --   ALKPHOS 83  --   --  86  --   BILITOT 1.1  --   --  0.8  --   GFRNONAA 20* 29* 31* 38* >60  GFRAA 23* 34* 36* 44* >60  ANIONGAP 16* 12  --  13 11     Hematology Recent Labs  Lab 09/21/19 0204 09/21/19 0528 09/23/19 0310  WBC 32.9* 30.7* 22.6*  RBC 3.43* 3.49* 3.32*  HGB 10.2* 10.2* 9.7*  HCT 30.4* 30.7* 29.3*  MCV 88.6 88.0 88.3  MCH 29.7 29.2 29.2  MCHC 33.6 33.2 33.1  RDW 14.2 14.2 13.9  PLT 262 272 247    BNP Recent Labs  Lab 09/21/19 0204  BNP 457.2*     DDimer No results for input(s): DDIMER in the last 168 hours.   Radiology    ECHOCARDIOGRAM COMPLETE  Result Date: 09/22/2019   ECHOCARDIOGRAM REPORT   Patient Name:   SHANDREA AHLMAN [facility] Date of Exam: 09/22/2019 Medical Rec #:  [id number]         Height:       69.0 in Accession #:    [id number]        Weight:       209.4 lb Date of Birth:  October 16, 1945         BSA:          2.11 m Patient Age:    74 years          BP:           148/82 mmHg Patient Gender: F                 HR:           83 bpm. Exam Location:  Inpatient Procedure: 2D Echo, Cardiac Doppler, Color Doppler and Intracardiac             Opacification Agent Indications:    Atrial Fibrillation 427.31  History:        Patient has prior history of Echocardiogram examinations, most                 recent 04/04/2017. Pulmonary HTN, Arrythmias:Atrial Fibrillation;                 Risk Factors:Hypertension, Diabetes, Dyslipidemia and Former                 Smoker. Sepsis. DOE.  Sonographer:    Paulita Fujita RDCS Referring Phys: [id number] KRISTEN D [place]  1. Left ventricular ejection fraction, by visual estimation, is 50 to 55%. The left ventricle has normal function. There is no left ventricular hypertrophy.  2. Left ventricular diastolic parameters are indeterminate.  3. Global right ventricle has moderately reduced systolic function.The right ventricular size is mildly enlarged.  4. Left atrial size was normal.  5. Right atrial size was normal.  6. The mitral valve is normal in structure. Mild mitral valve regurgitation.  7. The tricuspid valve is normal in structure.  8. The aortic valve is tricuspid. Aortic valve regurgitation is not visualized. No evidence of aortic valve sclerosis or stenosis.  9. The pulmonic valve was not well visualized. Pulmonic valve regurgitation is not visualized. 10. The inferior vena cava is dilated in size with <50% respiratory variability, suggesting right atrial pressure of 15 mmHg. 11. The tricuspid regurgitant velocity is 3.34 m/s, and with an assumed right atrial pressure of 15 mmHg, the estimated right ventricular systolic pressure is moderately elevated at 59.6 mmHg.  FINDINGS  Left Ventricle: Left ventricular ejection fraction, by visual estimation, is 50 to 55%. The left ventricle has normal function. Definity contrast agent was given IV to delineate the left ventricular endocardial borders. The left ventricle has no regional wall motion abnormalities. The left ventricular internal cavity size was the left ventricle is normal in size. There is no left ventricular hypertrophy. Left  ventricular diastolic parameters are indeterminate. Right Ventricle: The right ventricular size is mildly enlarged. No increase in right ventricular wall thickness. Global RV systolic function is has moderately reduced systolic function. The tricuspid regurgitant velocity is 3.34 m/s, and with an assumed right atrial pressure of 15 mmHg, the estimated right ventricular systolic pressure is moderately elevated at 59.6 mmHg. Left Atrium: Left atrial size was normal in size. Right Atrium: Right atrial size was normal in size Pericardium: Trivial pericardial effusion is present. Mitral Valve: The mitral valve is normal in structure. Mild mitral valve regurgitation. Tricuspid Valve: The tricuspid valve is normal in structure. Tricuspid valve regurgitation is trivial. Aortic Valve: The aortic valve is tricuspid. Aortic valve regurgitation is not visualized. The aortic valve is structurally normal, with no evidence of sclerosis or stenosis. Pulmonic Valve: The pulmonic valve was not well visualized. Pulmonic valve regurgitation is not visualized. Pulmonic regurgitation is not visualized. Aorta: The aortic root is normal in size and structure. Venous: The inferior vena cava is dilated in size with less than 50% respiratory variability, suggesting right atrial pressure of 15 mmHg. IAS/Shunts: The interatrial septum was not well visualized.  LEFT VENTRICLE PLAX 2D LVIDd:         4.75 cm LVIDs:         3.19 cm LV PW:         0.88 cm LV IVS:        0.88 cm LVOT diam:     1.90 cm LV SV:         64 ml LV SV Index:   29.51 LVOT Area:     2.84 cm  LV Volumes (MOD) LV area d, A2C:    34.50 cm LV area d, A4C:    30.30 cm LV area s, A2C:    22.00 cm LV area s, A4C:    17.80 cm LV major d, A2C:   8.09 cm LV major d, A4C:   7.90 cm LV major s, A2C:   7.54 cm LV major s, A4C:   6.55 cm LV vol d, MOD A2C: 122.0 ml LV vol d, MOD A4C: 99.8 ml LV vol s, MOD A2C: 59.4 ml LV vol s, MOD A4C: 42.4 ml LV SV MOD A2C:     62.6 ml LV SV MOD  A4C:     99.8 ml LV SV MOD BP:      56.9 ml RIGHT VENTRICLE RV S prime:     7.05 cm/s TAPSE (M-mode): 1.5 cm LEFT ATRIUM             Index       RIGHT ATRIUM           Index LA diam:        3.70 cm 1.76 cm/m  RA Area:     15.80 cm LA Vol (A2C):   34.9 ml 16.56 ml/m RA Volume:   36.80 ml  17.47 ml/m LA Vol (A4C):   44.8 ml 21.26 ml/m LA Biplane Vol: 39.5 ml 18.75 ml/m  AORTIC VALVE LVOT Vmax:   75.10 cm/s LVOT Vmean:  53.200 cm/s LVOT VTI:  0.135 m  AORTA Ao Root diam: 3.10 cm TRICUSPID VALVE TR Peak grad:   44.6 mmHg TR Vmax:        334.00 cm/s  SHUNTS Systemic VTI:  0.14 m Systemic Diam: 1.90 cm  Oswaldo Milian MD Electronically signed by Oswaldo Milian MD Signature Date/Time: 09/22/2019/10:02:58 PM    Final     Cardiac Studies   2D echocardiogram 09/22/2019: IMPRESSIONS    1. Left ventricular ejection fraction, by visual estimation, is 50 to 55%. The left ventricle has normal function. There is no left ventricular hypertrophy.  2. Left ventricular diastolic parameters are indeterminate.  3. Global right ventricle has moderately reduced systolic function.The right ventricular size is mildly enlarged.  4. Left atrial size was normal.  5. Right atrial size was normal.  6. The mitral valve is normal in structure. Mild mitral valve regurgitation.  7. The tricuspid valve is normal in structure.  8. The aortic valve is tricuspid. Aortic valve regurgitation is not visualized. No evidence of aortic valve sclerosis or stenosis.  9. The pulmonic valve was not well visualized. Pulmonic valve regurgitation is not visualized. 10. The inferior vena cava is dilated in size with <50% respiratory variability, suggesting right atrial pressure of 15 mmHg. 11. The tricuspid regurgitant velocity is 3.34 m/s, and with an assumed right atrial pressure of 15 mmHg, the estimated right ventricular systolic pressure is moderately elevated at 59.6 mmHg.  Patient Profile     74 y.o. female with newly  diagnosed atrial fibrillation with RVR in the setting of urosepsis  Assessment & Plan    1.  Atrial fibrillation with RVR: Continue oral diltiazem for rate control, apixaban for anticoagulation.  We will tentatively plan on elective DC cardioversion after 3 weeks of therapeutic anticoagulation. 2.  Acute on chronic diastolic heart failure: Likely secondary to a combination of her acute illness, atrial fibrillation, and volume resuscitation that was required for treatment of sepsis.  The patient does have mild volume overload on exam, her weight is increased, and I think we should start her on IV furosemide today.  We will follow up with a metabolic panel tomorrow. 3.  Acute kidney injury: Creatinine continues to improve as she gets out further from her acute illness.      For questions or updates, please contact [place] Please consult [email or web address] for contact info under        Signed, Sherren Mocha, MD  09/23/2019, 9:30 AM

## 2019-09-23 NOTE — Progress Notes (Signed)
PROGRESS NOTE  Debbie [name] [id number] DOB: [birth year] DOA: 09/20/2019 PCP: [name], MD  Brief Narrative:   History of A. Fib (was not on anticoagulation at home, Eliquis was started here), IDDM2,COPD, pulmonary hypertension, On O2 at 3 L [place] at home   Admitted 09/20/2019 by critical care: Presented to ED with complaints of 1 day of increased lethargy, per EMS recent UTI with just finished 10-day course of nitrofurantoin from PCP 3 days prior. Was hypotensive tachypneic, elevated WBC, elevated creatinine, positive urine leukocytes and nitrites.  A. fib on EKG.   Was on Levophed 2 mcg gtt., critical care was consulted, discontinued this.  Anion gap metabolic acidosis.  Critical care recommended stay on bicarb drip until AG closes, lactic acidosis/Metformin use likely cause.  Advised albumin and hydrocortisone, hold ARB, check TSH, mag, phos.  IV fluids tapered down given questionable congestion on chest x-ray findings. PCCM weaned off pressors and pt appears stable, was transitioned to [facility] 09/21/19      HPI/Recap of past 24 hours:   remains in afib, intermittent RVR She reports has episodes of sob last night, no hypoxia She reports her legs are swollen, right stump does not fit into her prosthesis due to edema  Assessment/Plan: Principal Problem:   Sepsis ([place]) Active Problems:   Acute lower UTI   Atrial fibrillation with RVR (HCC)   Septic shock (HCC)   Hypotension  Sepsis /septic shock presents on admission secondary to acute lower UTI, aki, acute metabolic encephalopathy -He was started on antibiotic by PCP, however she did not improve, she became disoriented and hypotensive, she was brought to the ED, she was briefly on levophed for 2hrs in the ED, she was subsequently off pressor -Started on Rocephin, and stress dose steroid in the ED as well -blood culture no growth, urine culture with multiple species -much improved, d/c Rocephin , start keflex, stop stress  dose steroids  AKI on CKDII No hydronephrosis on renal ultrasound Bun/cr 27/2.37 on presentation, renal function improved, now close to baseline , bun 28/cr 0.88 this morning,  Renal dosing meds  New diagnosis of A. Fib -TSH 1.1 -She is started on Eliquis since hospitalization -Echo  lvef 50-55%, left atrial size normal, keep K.4, mag >2 -cardiology consulted, input appreciated, plan to cardioversion after three weeks of anticoagulation  Acute on chronic diastolic chf She reports sob last night, no hypoxia, sob possibly due to afib, could also be from fluids overload, will give one dose of iv lasix this morning, monitor  Hypomagnesemia Mag 0.9 on presentation, reports poor oral intake at home S/p magnesium IV 4 g  On 1/6, mag 2 this am   Transaminitis, likely from sepsis,-LFT normalized -Abdominal ultrasound "post cholecystectomy. Prominence of the biliary tree may be a combination of senescent change and post cholecystectomy reservoir effect. 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  DVT Prophylaxis: eliquis  Code Status: Full  Family Communication: patient   Disposition Plan: improving, start PT, needs cardiology clearance for discharge, possible  d/c in 24-48hrs will likely benefit from home health when medically ready   Consultants: Critical care, signed off  Cardiology  Procedures:  None  Antibiotics:  Rocephin then keflex   Objective: BP (!) 145/88  (BP Location: Left Arm)    Pulse 78    Temp 97.7 F (36.5 C) (Oral)    Resp 14    Ht 5\' 9"  (1.753 m)    Wt 96.9 kg    SpO2 99%    BMI 31.55 kg/m   Intake/Output Summary (Last 24 hours) at 09/23/2019 [id number] Last data filed at 09/23/2019 0844 Gross per 24 hour  Intake 1413.33 ml  Output 702 ml  Net 711.33 ml   Filed Weights   09/21/19 2100 09/23/19 0328  Weight: 95 kg 96.9 kg    Exam: Patient is examined daily including today on 09/23/2019, exams remain the same as of yesterday except that has changed    General:  Frail but NAD  Cardiovascular: IRRR  Respiratory: CTABL  Abdomen: Soft/ND/NT, positive BS  Musculoskeletal: trace pitting  Edema, status post right BKA  Neuro: alert, oriented   Data Reviewed: Basic Metabolic Panel: Recent Labs  Lab 09/20/19 1621 09/21/19 0204 09/21/19 0528 09/21/19 1542 09/23/19 0310  NA 137 139  --  140 143  K 4.0 4.5  --  4.2 3.8  CL 104 107  --  102 98  CO2 17* 20*  --  25 34*  GLUCOSE 173* 159*  --  218* 214*  BUN 27* 30*  --  32* 28*  CREATININE 2.37* 1.72* 1.63* 1.37* 0.88  CALCIUM 8.8* 8.1*  --  8.2* 8.5*  MG  --  0.9*  --   --  2.0  PHOS  --  4.5  --   --   --    Liver Function Tests: Recent Labs  Lab 09/20/19 1621 09/21/19 1542  AST 98* 37  ALT 69* 44  ALKPHOS 83 86  BILITOT 1.1 0.8  PROT 5.9* 5.9*  ALBUMIN 2.6* 2.9*   No results for input(s): LIPASE, AMYLASE in the last 168 hours. No results for input(s): AMMONIA in the last 168 hours. CBC: Recent Labs  Lab 09/20/19 1621 09/21/19 0204 09/21/19 0528 09/23/19 0310  WBC 31.0* 32.9* 30.7* 22.6*  NEUTROABS 29.1*  --   --  19.4*  HGB 11.2* 10.2* 10.2* 9.7*  HCT 35.0* 30.4* 30.7* 29.3*  MCV 90.2 88.6 88.0 88.3  PLT 314 262 272 247   Cardiac Enzymes:   Recent Labs  Lab 09/21/19 0528  CKTOTAL 36*  CKMB 9.2*   BNP (last 3 results) Recent Labs    09/21/19 0204  BNP 457.2*    ProBNP (last 3 results) No results for input(s): PROBNP in the last 8760  hours.  CBG: Recent Labs  Lab 09/22/19 0620 09/22/19 1100 09/22/19 1610 09/22/19 2129 09/23/19 0612  GLUCAP 198* 273* 281* 364* 196*    Recent Results (from the past 240 hour(s))  Blood Culture (routine x 2)     Status: None (Preliminary result)   Collection Time: 09/20/19  4:05 PM   Specimen: BLOOD  Result Value Ref Range Status   Specimen Description BLOOD LEFT ANTECUBITAL  Final   Special Requests   Final    BOTTLES DRAWN AEROBIC AND ANAEROBIC Blood Culture results may not be optimal due to an inadequate volume of blood received in culture bottles   Culture   Final    NO GROWTH 2 DAYS Performed at [facility],  [address], [place], [place] [address]    Report Status PENDING  Incomplete  Blood Culture (routine x 2)     Status: None (Preliminary result)   Collection Time: 09/20/19  4:18 PM   Specimen: BLOOD LEFT FOREARM  Result Value Ref Range Status   Specimen Description BLOOD LEFT FOREARM  Final   Special Requests   Final    BOTTLES DRAWN AEROBIC AND ANAEROBIC Blood Culture results may not be optimal due to an inadequate volume of blood received in culture bottles   Culture   Final    NO GROWTH 2 DAYS Performed at [facility], [place] [address], [place], [place] [address]    Report Status PENDING  Incomplete  Respiratory Panel by RT PCR (Flu A&B, Covid) - Nasopharyngeal Swab     Status: None   Collection Time: 09/20/19  5:50 PM   Specimen: Nasopharyngeal Swab  Result Value Ref Range Status   SARS Coronavirus 2 by RT PCR NEGATIVE NEGATIVE Final    Comment: (NOTE) SARS-CoV-2 target nucleic acids are NOT DETECTED. The SARS-CoV-2 RNA is generally detectable in upper respiratoy specimens during the acute phase of infection. The lowest concentration of SARS-CoV-2 viral copies this assay can detect is 131 copies/mL. A negative result does not preclude SARS-Cov-2 infection and should not be used as the sole basis for treatment or other patient management  decisions. A negative result may occur with  improper specimen collection/handling, submission of specimen other than nasopharyngeal swab, presence of viral mutation(s) within the areas targeted by this assay, and inadequate number of viral copies (<131 copies/mL). A negative result must be combined with clinical observations, patient history, and epidemiological information. The expected result is Negative. Fact Sheet for Patients:  PinkCheek.be Fact Sheet for Healthcare Providers:  GravelBags.it This test is not yet ap proved or cleared by the [place] FDA and  has been authorized for detection and/or diagnosis of SARS-CoV-2 by FDA under an Emergency Use Authorization (EUA). This EUA will remain  in effect (meaning this test can be used) for the duration of the COVID-19 declaration under Section 564(b)(1) of the Act, 21 U.S.C. section 360bbb-3(b)(1), unless the authorization is terminated or revoked sooner.    Influenza A by PCR NEGATIVE NEGATIVE Final   Influenza B by PCR NEGATIVE NEGATIVE Final    Comment: (NOTE) The Xpert Xpress SARS-CoV-2/FLU/RSV assay is intended as an aid in  the diagnosis of influenza from Nasopharyngeal swab specimens and  should not be used as a sole basis for treatment. Nasal washings and  aspirates are unacceptable for Xpert Xpress SARS-CoV-2/FLU/RSV  testing. Fact Sheet for Patients: PinkCheek.be Fact Sheet for Healthcare Providers: GravelBags.it This test is not yet approved or cleared by the [place] FDA and  has been authorized for detection and/or diagnosis of SARS-CoV-2 by  FDA under an Emergency Use Authorization (EUA). This EUA will remain  in effect (meaning this test can be used) for the duration of the  Covid-19 declaration under Section 564(b)(1) of the Act, 21  U.S.C. section 360bbb-3(b)(1), unless the authorization  is  terminated or revoked. Performed at [facility], [place] [address] [place], [place] [address]   Urine culture     Status: Abnormal   Collection Time: 09/21/19  5:28 AM   Specimen: In/Out Cath Urine  Result Value Ref Range Status   Specimen Description IN/OUT CATH URINE  Final   Special Requests   Final    NONE Performed at [facility], [place] [address] [place], [place] [address]    Culture MULTIPLE SPECIES PRESENT, SUGGEST RECOLLECTION (A)  Final   Report Status 09/22/2019 FINAL  Final     Studies: ECHOCARDIOGRAM COMPLETE  Result Date: 09/22/2019   ECHOCARDIOGRAM REPORT   Patient Name:   KALYSE PAVLOVIC [facility] Date of Exam: 09/22/2019 Medical Rec #:  [id number]         Height:       69.0 in Accession #:    [id number]        Weight:       209.4 lb Date of Birth:  11-17-1945         BSA:          2.11 m Patient Age:    2 years          BP:           148/82 mmHg Patient Gender: F                 HR:           83 bpm. Exam Location:  Inpatient Procedure: 2D Echo, Cardiac Doppler, Color Doppler and Intracardiac            Opacification Agent Indications:    Atrial Fibrillation 427.31  History:        Patient has prior history of Echocardiogram examinations, most                 recent 04/04/2017. Pulmonary HTN, Arrythmias:Atrial Fibrillation;                 Risk Factors:Hypertension, Diabetes, Dyslipidemia and Former                 Smoker. Sepsis. DOE.  Sonographer:    Paulita Fujita RDCS Referring Phys: [id number] KRISTEN D [place]  1. Left ventricular ejection fraction, by visual estimation, is 50 to 55%. The left ventricle has normal function. There is no left ventricular hypertrophy.  2. Left ventricular diastolic parameters are indeterminate.  3. Global right ventricle has moderately reduced systolic function.The right ventricular size is mildly enlarged.  4. Left atrial size was normal.  5. Right atrial size was normal.  6. The mitral valve is normal in structure. Mild  mitral valve regurgitation.  7. The tricuspid valve is normal in structure.  8. The aortic valve is tricuspid. Aortic valve regurgitation is not visualized. No evidence of aortic valve sclerosis or stenosis.  9. The pulmonic valve was not well visualized. Pulmonic valve regurgitation is not visualized. 10. The inferior vena cava is dilated in size with <50% respiratory variability, suggesting right atrial pressure of 15 mmHg. 11. The tricuspid regurgitant velocity is 3.34 m/s, and with an assumed right atrial pressure of 15 mmHg, the estimated right ventricular systolic pressure is moderately elevated at 59.6 mmHg. FINDINGS  Left Ventricle: Left ventricular ejection fraction, by visual estimation, is 50 to 55%. The left ventricle has normal function. Definity contrast agent was given IV to delineate the left ventricular endocardial borders. The left ventricle has no regional wall motion abnormalities. The left ventricular internal cavity size was the left ventricle is normal in size. There is no left ventricular hypertrophy. Left ventricular diastolic parameters are indeterminate. Right Ventricle: The right ventricular size is mildly enlarged. No increase in right ventricular wall thickness. Global RV systolic function is has moderately reduced systolic function. The tricuspid regurgitant velocity is 3.34 m/s, and with an assumed right atrial pressure of 15 mmHg, the  estimated right ventricular systolic pressure is moderately elevated at 59.6 mmHg. Left Atrium: Left atrial size was normal in size. Right Atrium: Right atrial size was normal in size Pericardium: Trivial pericardial effusion is present. Mitral Valve: The mitral valve is normal in structure. Mild mitral valve regurgitation. Tricuspid Valve: The tricuspid valve is normal in structure. Tricuspid valve regurgitation is trivial. Aortic Valve: The aortic valve is tricuspid. Aortic valve regurgitation is not visualized. The aortic valve is structurally  normal, with no evidence of sclerosis or stenosis. Pulmonic Valve: The pulmonic valve was not well visualized. Pulmonic valve regurgitation is not visualized. Pulmonic regurgitation is not visualized. Aorta: The aortic root is normal in size and structure. Venous: The inferior vena cava is dilated in size with less than 50% respiratory variability, suggesting right atrial pressure of 15 mmHg. IAS/Shunts: The interatrial septum was not well visualized.  LEFT VENTRICLE PLAX 2D LVIDd:         4.75 cm LVIDs:         3.19 cm LV PW:         0.88 cm LV IVS:        0.88 cm LVOT diam:     1.90 cm LV SV:         64 ml LV SV Index:   29.51 LVOT Area:     2.84 cm  LV Volumes (MOD) LV area d, A2C:    34.50 cm LV area d, A4C:    30.30 cm LV area s, A2C:    22.00 cm LV area s, A4C:    17.80 cm LV major d, A2C:   8.09 cm LV major d, A4C:   7.90 cm LV major s, A2C:   7.54 cm LV major s, A4C:   6.55 cm LV vol d, MOD A2C: 122.0 ml LV vol d, MOD A4C: 99.8 ml LV vol s, MOD A2C: 59.4 ml LV vol s, MOD A4C: 42.4 ml LV SV MOD A2C:     62.6 ml LV SV MOD A4C:     99.8 ml LV SV MOD BP:      56.9 ml RIGHT VENTRICLE RV S prime:     7.05 cm/s TAPSE (M-mode): 1.5 cm LEFT ATRIUM             Index       RIGHT ATRIUM           Index LA diam:        3.70 cm 1.76 cm/m  RA Area:     15.80 cm LA Vol (A2C):   34.9 ml 16.56 ml/m RA Volume:   36.80 ml  17.47 ml/m LA Vol (A4C):   44.8 ml 21.26 ml/m LA Biplane Vol: 39.5 ml 18.75 ml/m  AORTIC VALVE LVOT Vmax:   75.10 cm/s LVOT Vmean:  53.200 cm/s LVOT VTI:    0.135 m  AORTA Ao Root diam: 3.10 cm TRICUSPID VALVE TR Peak grad:   44.6 mmHg TR Vmax:        334.00 cm/s  SHUNTS Systemic VTI:  0.14 m Systemic Diam: 1.90 cm  Oswaldo Milian MD Electronically signed by Oswaldo Milian MD Signature Date/Time: 09/22/2019/10:02:58 PM    Final     Scheduled Meds:  apixaban  5 mg Oral BID   diltiazem  120 mg Oral Daily   feeding supplement (GLUCERNA SHAKE)  237 mL Oral TID BM   furosemide  40  mg Intravenous Once   gabapentin  100 mg Oral TID   hydrocortisone sod  succinate (SOLU-CORTEF) inj  100 mg Intravenous Q12H   insulin aspart  0-5 Units Subcutaneous QHS   insulin aspart  0-9 Units Subcutaneous TID WC   loratadine  10 mg Oral Daily   multivitamin with minerals  1 tablet Oral Daily   pantoprazole  40 mg Oral QHS   potassium chloride  40 mEq Oral Once    Continuous Infusions:  sodium chloride Stopped (09/20/19 2317)   cefTRIAXone (ROCEPHIN)  IV 1 g (09/22/19 1654)     Time spent: 2mins I have personally reviewed and interpreted on  09/23/2019 daily labs, tele strips, imagings as discussed above under date review session and assessment and plans.  I reviewed all nursing notes, pharmacy notes, consultant notes,  vitals, pertinent old records  I have discussed plan of care as described above with RN , patient on 09/23/2019   Florencia Reasons MD, PhD, FACP  [facility] Pager [phone]. If 7PM-7AM, please contact night-coverage at [email or web address], password [facility] 09/23/2019, 9:09 AM  LOS: 3 days

## 2019-09-23 NOTE — Progress Notes (Signed)
RT called to pt room to assess pt for SOB. RT arrived to find pt with stable respiratory status on [place] 3 Lpm. Pt tells RT that she feels like someone is sitting on her chest and that she has pain in her left shoulder blade. Pt sats 99% RR 16 at time of eval, BBS were clear diminished on auscultation. RT made RN aware of pts complaint and RT findings. RT will continue to monitor.

## 2019-09-23 NOTE — Progress Notes (Signed)
Results for KAMIRAH, HAEFELE (MRN [id number]) as of 09/23/2019 14:16  Ref. Range 09/22/2019 06:20 09/22/2019 11:00 09/22/2019 16:10 09/22/2019 21:29 09/23/2019 06:12  Glucose-Capillary Latest Ref Range: 70 - 99 mg/dL 198 (H) 273 (H) 281 (H) 364 (H) 196 (H)  Noted that postprandial blood sugars are greater than 200 mg/dl.   Recommend increasing Novolog correction scale to MODERATE TID  & HS. If blood sugars continue to be greater than 200 mg/dl., recommend adding Novolog 3 units TID with meals if eating at least 50% of meal.   Harvel Ricks RN BSN CDE Diabetes Coordinator Pager: [phone]  8am-5pm

## 2019-09-24 ENCOUNTER — Telehealth: Payer: Self-pay | Admitting: Physician Assistant

## 2019-09-24 DIAGNOSIS — Z89511 Acquired absence of right leg below knee: Secondary | ICD-10-CM

## 2019-09-24 DIAGNOSIS — E669 Obesity, unspecified: Secondary | ICD-10-CM

## 2019-09-24 DIAGNOSIS — R945 Abnormal results of liver function studies: Secondary | ICD-10-CM

## 2019-09-24 LAB — CBC WITH DIFFERENTIAL/PLATELET
Abs Immature Granulocytes: 0.13 10*3/uL — ABNORMAL HIGH (ref 0.00–0.07)
Basophils Absolute: 0 10*3/uL (ref 0.0–0.1)
Basophils Relative: 0 %
Eosinophils Absolute: 0.2 10*3/uL (ref 0.0–0.5)
Eosinophils Relative: 1 %
HCT: 32.5 % — ABNORMAL LOW (ref 36.0–46.0)
Hemoglobin: 10.3 g/dL — ABNORMAL LOW (ref 12.0–15.0)
Immature Granulocytes: 1 %
Lymphocytes Relative: 30 %
Lymphs Abs: 4.5 10*3/uL — ABNORMAL HIGH (ref 0.7–4.0)
MCH: 28.4 pg (ref 26.0–34.0)
MCHC: 31.7 g/dL (ref 30.0–36.0)
MCV: 89.5 fL (ref 80.0–100.0)
Monocytes Absolute: 0.7 10*3/uL (ref 0.1–1.0)
Monocytes Relative: 5 %
Neutro Abs: 9.3 10*3/uL — ABNORMAL HIGH (ref 1.7–7.7)
Neutrophils Relative %: 63 %
Platelets: 253 10*3/uL (ref 150–400)
RBC: 3.63 MIL/uL — ABNORMAL LOW (ref 3.87–5.11)
RDW: 13.7 % (ref 11.5–15.5)
WBC: 14.8 10*3/uL — ABNORMAL HIGH (ref 4.0–10.5)
nRBC: 0 % (ref 0.0–0.2)

## 2019-09-24 LAB — GLUCOSE, CAPILLARY
Glucose-Capillary: 243 mg/dL — ABNORMAL HIGH (ref 70–99)
Glucose-Capillary: 159 mg/dL — ABNORMAL HIGH (ref 70–99)

## 2019-09-24 LAB — BASIC METABOLIC PANEL
Anion gap: 10 (ref 5–15)
BUN: 27 mg/dL — ABNORMAL HIGH (ref 8–23)
CO2: 35 mmol/L — ABNORMAL HIGH (ref 22–32)
Calcium: 8.7 mg/dL — ABNORMAL LOW (ref 8.9–10.3)
Chloride: 96 mmol/L — ABNORMAL LOW (ref 98–111)
Creatinine, Ser: 0.94 mg/dL (ref 0.44–1.00)
GFR calc Af Amer: >60 mL/min (ref >60)
GFR calc non Af Amer: >60 mL/min (ref >60)
Glucose, Bld: 176 mg/dL — ABNORMAL HIGH (ref 70–99)
Potassium: 4.2 mmol/L (ref 3.5–5.1)
Sodium: 141 mmol/L (ref 135–145)

## 2019-09-24 LAB — MAGNESIUM: Magnesium: 1.6 mg/dL — ABNORMAL LOW (ref 1.7–2.4)

## 2019-09-24 MED ORDER — DILTIAZEM HCL ER COATED BEADS 120 MG PO CP24
120.0000 mg | ORAL_CAPSULE | Freq: Once | ORAL | Status: AC
  Administered 2019-09-24: 10:00:00 120 mg via ORAL
  Filled 2019-09-24: qty 1

## 2019-09-24 MED ORDER — DILTIAZEM HCL ER COATED BEADS 240 MG PO CP24: 240.0000 mg | ORAL_CAPSULE | Freq: Every day | ORAL | 0 refills | Status: AC

## 2019-09-24 MED ORDER — CEPHALEXIN 500 MG PO CAPS: 500.0000 mg | ORAL_CAPSULE | Freq: Two times a day (BID) | ORAL | 0 refills | Status: AC

## 2019-09-24 MED ORDER — FUROSEMIDE 40 MG PO TABS: 20.0000 mg | ORAL_TABLET | Freq: Every day | ORAL | 0 refills | Status: DC | PRN

## 2019-09-24 MED ORDER — LOSARTAN POTASSIUM 100 MG PO TABS: 50.0000 mg | ORAL_TABLET | Freq: Every day | ORAL | 0 refills | Status: DC

## 2019-09-24 MED ORDER — APIXABAN 5 MG PO TABS: 5.0000 mg | ORAL_TABLET | Freq: Two times a day (BID) | ORAL | 0 refills | Status: DC

## 2019-09-24 MED ORDER — DILTIAZEM HCL ER COATED BEADS 240 MG PO CP24: 240.0000 mg | ORAL_CAPSULE | Freq: Every day | ORAL | Status: DC

## 2019-09-24 MED ORDER — MAGNESIUM SULFATE 2 GM/50ML IV SOLN
2.0000 g | Freq: Once | INTRAVENOUS | Status: AC
  Administered 2019-09-24: 2 g via INTRAVENOUS
  Filled 2019-09-24: qty 50

## 2019-09-24 MED FILL — FUROSEMIDE 40 MG TABLET: 40 | 60 days supply | Qty: 30 | Fill #0

## 2019-09-24 MED FILL — ELIQUIS 5 MG TABLET: 5 | 30 days supply | Qty: 60 | Fill #0

## 2019-09-24 MED FILL — CEPHALEXIN 500 MG CAPS: 500 | 3 days supply | Qty: 6 | Fill #0

## 2019-09-24 MED FILL — CARTIA XT 240 MG CAPSULE: 240 | 30 days supply | Qty: 30 | Fill #0

## 2019-09-24 NOTE — Plan of Care (Signed)
  Problem: Clinical Measurements: Goal: Ability to maintain clinical measurements within normal limits will improve 09/24/2019 0008 by Drenda Freeze, RN Outcome: Progressing 09/24/2019 0006 by Drenda Freeze, RN Outcome: Progressing   Problem: Clinical Measurements: Goal: Will remain free from infection 09/24/2019 0008 by Drenda Freeze, RN Outcome: Progressing 09/24/2019 0006 by Drenda Freeze, RN Outcome: Progressing

## 2019-09-24 NOTE — Plan of Care (Signed)
  Problem: Clinical Measurements: Goal: Ability to maintain clinical measurements within normal limits will improve Outcome: Progressing   Problem: Clinical Measurements: Goal: Will remain free from infection Outcome: Progressing   

## 2019-09-24 NOTE — TOC Transition Note (Signed)
Transition of Care [facility]) - CM/SW Discharge Note Marvetta Gibbons RN, BSN Transitions of Care Unit 4E- RN Case Manager [phone]   Patient Details  Name: Debbie [name] MRN: [id number] Date of Birth: 04/14/1946  Transition of Care [facility]) CM/SW Contact:  Dawayne Patricia, RN Phone Number: 09/24/2019, 3:13 PM   Clinical Narrative:    Pt stable for transition home today, all [place] arrangements in place- pt has home concentrator that will be brought for transport home, TOC pharmacy to fill meds and deliver to bedside prior to discharge. No further TOC needs noted.    Final next level of care: [place] Barriers to Discharge: Barriers Resolved   Patient Goals and CMS Choice Patient states their goals for this hospitalization and ongoing recovery are:: return home CMS Medicare.gov Compare Post Acute Care list provided to:: Patient Choice offered to / list presented to : Patient  Discharge Placement               Home with [facility]        Discharge Plan and Services   Discharge Planning Services: CM Consult Post Acute Care Choice: Home Health          DME Arranged: N/A DME Agency: NA       HH Arranged: RN, PT [place] Agency: [place] ([place]) Date HH Agency Contacted: 09/23/19 Time [place]: 1600 Representative spoke with at [place]: [place] ([place]) Interventions     Readmission Risk Interventions Readmission Risk Prevention Plan 09/23/2019  Transportation Screening Complete  PCP or Specialist Appt within 5-7 Days Complete  Home Care Screening Complete  Medication Review (RN CM) Complete  Some recent data might be hidden

## 2019-09-24 NOTE — Discharge Summary (Signed)
Discharge Summary  Debbie [name] KNL:[id number] DOB: 1946/01/19  PCP: [name], MD  Admit date: 09/20/2019 Discharge date: 09/24/2019  Time spent: 44mns, more than 50% time spent on coordination of care.  Case discussed with cardiology over the phone prior to discharge.  Recommendations for Outpatient Follow-up:  1. F/u with PCP within a week  for hospital discharge follow up, repeat cbc/bmp at follow up. 2. F/u with cardiology in two weeks 3. Home health arranged  Discharge Diagnoses:  Active Hospital Problems   Diagnosis Date Noted  . Sepsis ([place] 09/20/2019  . Abnormal liver function   . Hypomagnesemia   . H[place] of right BKA ([place]   . Obesity (BMI 30.0-34.9)   . Hypotension 09/21/2019  . Acute lower UTI 09/20/2019  . Atrial fibrillation with RVR ([place] 09/20/2019  . Septic shock ([place] 09/20/2019    Resolved Hospital Problems  No resolved problems to display.    Discharge Condition: stable  Diet recommendation: heart healthy/carb modified  Filed Weights   09/21/19 2100 09/23/19 0328  Weight: 95 kg 96.9 kg    History of present illness: (Per admitting MD Dr. KMaudie Mercury GMeliana [name] is a 74y.o. female,  w hypertension, hyperlipidemia, Dm2, PAD, Copd/ Asthma, w recent UTI tx with oral abx. Apparently presents with confusion.    In Ed,  T 99.5,  P 128, R 25, Bp 79/61  Pox 98% on3L [place]  Na 137, K 4.0, Bun 27, Creatinine 2.37  Ast 98, Alt 69, Alk phos 83, T. Bili 1.1 Wbc 31.0, Hgb 11.2, Plt 314 INR 1.3 Lactic acid 5.0  Sars coronavirus  Negative  CXR ->  IMPRESSION: Cardiomegaly. There is mild, diffuse bilateral interstitial opacity, likely mild edema although exaggerated by low volume AP portable technique. No focal airspace opacity.   Pt admitted to PCCM Pt was given 4L of LR and started on levophed. Pt also started on solucortef 109miv q8h, and continued on LR at 7522mer hour as well as sodium bicarb at 16m22mr hour.    Per PCCM patient  weaned off pressors and appears stable and they request transfer to [facility] and have placed for a  cardiac tele ?  Hospital Course:  Principal Problem:   Sepsis (HCC)[place] Problems:   Acute lower UTI   Atrial fibrillation with RVR (HCC)   Septic shock (HCC)   Hypotension   Abnormal liver function   Hypomagnesemia   Hx of right BKA (HCC)   Obesity (BMI 30.0-34.9)   Sepsis /septic shock presents on admission secondary to acute lower UTI, aki, acute metabolic encephalopathy -He was started on antibiotic by PCP, however she did not improve, she became disoriented and hypotensive, she was brought to the ED, she was briefly on levophed for 2hrs in the ED, she was subsequently off pressor -Started on Rocephin, and stress dose steroid in the ED as well -blood culture no growth, urine culture with multiple species -much improved, d/c Rocephin , start keflex, stop stress dose steroids -Sepsis physiology has resolved, she is discharged on Keflex to finish antibiotic treatment.  AKI on CKDII No hydronephrosis on renal ultrasound Bun/cr 27/2.37 on presentation, renal function improved, now close to baseline , bun 28/cr 0.88 this morning,  Renal dosing meds  New diagnosis of A. Fib -TSH 1.1 -She is started on Eliquis 5 mg twice daily and Cardizem 240 mg daily this hospitalization.  Home medication aspirin discontinued at discharge(she reports h/o easy bruising) -Echo  lvef 50-55%, left atrial  size normal, keep K.4, mag >2 -cardiology consulted, input appreciated, plan to cardioversion after three weeks of anticoagulation -She is to follow-up with cardiology in 2 weeks per Dr. Burt Knack.  Acute on chronic diastolic chf She reports sob when laying flat at night, no hypoxia, she does has lower extremity edema. A. fib could also contribute to short of breath,  -Short of breath and edema resolved after 2 dose of IV Lasix on January 7 -She is discharged home on Lasix 20 mg daily as needed for  edema.  Hypomagnesemia Mag 0.9 on presentation, reports poor oral intake at home S/p magnesium IV 4 g  On 1/6 and 2 g IV mag prior to discharge. Magnesium level improved after supplement   Transaminitis, likely from sepsis,-LFT normalized -Abdominal ultrasound "post cholecystectomy. Prominence of the biliary tree may be a combination of senescent change and post cholecystectomy reservoir effect. 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  Nicardipine discontinued at discharge due to now she is started on Cardizem for A. fib  Obesity: Body mass index is 31.55 kg/m.  FTT: Home health arranged  DVT Prophylaxis: eliquis  Code Status: Full  Family Communication: patient   Disposition Plan: Home with home health   Consultants: Critical care, signed off  Cardiology  Procedures:  None  Antibiotics:  Rocephin then keflex   Discharge Exam: BP (!) 131/100 (BP Location: Right Arm)   Pulse 74   Temp 98.7 F (37.1 C) (Oral)   Resp 16   Ht 5' 9" (1.753 m)   Wt 96.9 kg   SpO2 100%   BMI 31.55 kg/m   General: NAD, AAOx3 Cardiovascular: I RRR Respiratory: CTA BL Extremity: Lower extremity edema has resolved, status post right BKA with prosthesis on  Discharge Instructions You were cared for by a hospitalist during your  hospital stay. If you have any questions about your discharge medications or the care you received while you were in the hospital after you are discharged, you can call the unit and asked to speak with the hospitalist on call if the hospitalist that took care of you is not available. Once you are discharged, your primary care physician will handle any further medical issues. Please note that NO REFILLS for any discharge medications will be authorized once you are discharged, as it is imperative that you return to your primary care physician (or establish a relationship with a primary care physician if you do not have one) for your aftercare needs so that they can reassess your need for medications and monitor your lab values.  Discharge Instructions    Diet - low sodium heart healthy   Complete by: As directed    Carb modified diet   Increase activity slowly   Complete by: As directed      Allergies as of 09/24/2019      Reactions   Meloxicam Other (See Comments)  Causes excess Fluid buildup      Medication List    STOP taking these medications   aspirin EC 81 MG tablet   NIFEdipine 30 MG 24 hr tablet Commonly known as: PROCARDIA-XL/NIFEDICAL-XL   nitrofurantoin (macrocrystal-monohydrate) 100 MG capsule Commonly known as: MACROBID     TAKE these medications   apixaban 5 MG Tabs tablet Commonly known as: ELIQUIS Take 1 tablet (5 mg total) by mouth 2 (two) times daily.   Basaglar KwikPen 100 UNIT/ML Sopn Inject 45 Units into the skin daily.   BD Pen Needle Nano U/F 32G X 4 MM Misc Generic drug: Insulin Pen Needle 2 (two) times daily. as directed   BD Pen Needle Nano U/F 32G X 4 MM Misc Generic drug: Insulin Pen Needle USE TWICE DAILY AS DIRECTED   CALCIUM 1000 + D PO Take 1,000 mg by mouth daily.   cephALEXin 500 MG capsule Commonly known as: KEFLEX Take 1 capsule (500 mg total) by mouth every 12 (twelve) hours for 3 days.   cetirizine 10 MG tablet Commonly known as:  ZYRTEC Take 10 mg by mouth daily as needed for allergies.   CO Q 10 PO Take 300 mg by mouth daily.   dexlansoprazole 60 MG capsule Commonly known as: Dexilant Take 1 capsule (60 mg total) by mouth daily.   diltiazem 240 MG 24 hr capsule Commonly known as: CARDIZEM CD Take 1 capsule (240 mg total) by mouth daily. Start taking on: September 25, 2019   ezetimibe 10 MG tablet Commonly known as: ZETIA Take 10 mg by mouth daily.   fluticasone 50 MCG/ACT nasal spray Commonly known as: FLONASE Place 1 spray into the nose daily as needed for allergies.   furosemide 40 MG tablet Commonly known as: Lasix Take 0.5 tablets (20 mg total) by mouth daily as needed for fluid or edema.   gabapentin 100 MG capsule Commonly known as: NEURONTIN Take 100 mg by mouth 3 (three) times daily.   halobetasol 0.05 % cream Commonly known as: ULTRAVATE Apply 1 application topically 2 (two) times daily.   ibandronate 150 MG tablet Commonly known as: BONIVA Take 150 mg by mouth every 30 (thirty) days.   ibuprofen 200 MG tablet Commonly known as: ADVIL Take 600 mg by mouth every 6 (six) hours as needed for headache or mild pain.   Janumet XR 50-1000 MG Tb24 Generic drug: SitaGLIPtin-MetFORMIN HCl Take 1 tablet by mouth 2 (two) times daily.   losartan 100 MG tablet Commonly known as: COZAAR Take 0.5 tablets (50 mg total) by mouth daily. What changed: how much to take   OXYGEN Place 3 L into the nose See admin instructions. 3 lpm with sleep and exertion  APS   PROBIOTIC ADVANCED PO Take 1 tablet by mouth daily.   Voltaren 1 % Gel Generic drug: diclofenac Sodium Apply 2 g topically 4 (four) times daily as needed (pain).      Allergies  Allergen Reactions  . Meloxicam Other (See Comments)    Causes excess Fluid buildup   Follow-up Information    [facility].   Specialty: [place] Why: HHRN/PT arranged- they will contact you to set up home  visits [phone])       Leanor Kail, PA. Go on 10/06/2019.   Specialty: Cardiology Why: @ 1pm for hospital follow up with Dr. Antionette Char PA Please arrive 10 minutes early  Contact information: [place] [address] [phone]  [name], MD Follow up in 1 week(s).   Specialty: Family Medicine Why: hospital discharge follow up, repeat cbc/bmp at follow up. Contact information: [place] [address] [phone]            The results of significant diagnostics from this hospitalization (including imaging, microbiology, ancillary and laboratory) are listed below for reference.    Significant Diagnostic Studies: US Abdomen Complete  Result Date: 09/21/2019 CLINICAL DATA:  Elevated LFTs, BUN and creatinine, history of cholecystectomy EXAM: ABDOMEN ULTRASOUND COMPLETE COMPARISON:  PET-CT 10/18/2010 FINDINGS: Gallbladder: Surgically absent Common bile duct: Diameter: 7.9 mm proximally, 8.4 mm distally, likely within normal limits for patient age and post cholecystectomy state. Liver: Parenchymal echogenicity is within normal limits. Slightly nodular hepatic surface contour. No focal liver lesions are identified. Portal vein is patent on color Doppler imaging with normal direction of blood flow towards the liver. IVC: No abnormality visualized. Pancreas: Visualized portion unremarkable. Portions of the tail and body are obscured by bowel gas. Spleen: Size and appearance within normal limits. Right Kidney: 11.4 x 5.3 x 5.4 cm (volume 170 mL). Echogenicity within normal limits. No mass or hydronephrosis visualized. Left Kidney: 11.0 x 6.1 x 5.4 cm (volume 190 mL). Echogenicity within normal limits. No mass or hydronephrosis visualized. Abdominal aorta: No proximal aneurysm identified. Mid to distal aorta is poorly visualized due to bowel gas. Other findings: None. IMPRESSION: Patient is post cholecystectomy. Prominence of  the biliary tree may be a combination of senescent change and post cholecystectomy reservoir effect. Slightly nodular hepatic surface contour, can be seen in the setting of intrinsic liver disease including potential cirrhosis. Correlate with serologies. Electronically Signed   By: [name] M.D.   On: 09/21/2019 03:18   DG Chest Port 1 View  Result Date: 09/20/2019 CLINICAL DATA:  Lethargy, possible sepsis EXAM: PORTABLE CHEST 1 VIEW COMPARISON:  06/29/2019 FINDINGS: Cardiomegaly. Low volume AP portable examination with mild, diffuse bilateral interstitial opacity. The visualized skeletal structures are unremarkable. IMPRESSION: Cardiomegaly. There is mild, diffuse bilateral interstitial opacity, likely mild edema although exaggerated by low volume AP portable technique. No focal airspace opacity. Electronically Signed   By: Eddie Candle M.D.   On: 09/20/2019 16:54   ECHOCARDIOGRAM COMPLETE  Result Date: 09/22/2019   ECHOCARDIOGRAM REPORT   Patient Name:   Debbie [name] [place] [facility] Date of Exam: 09/22/2019 Medical Rec #:  [id number]         Height:       69.0 in Accession #:    [id number]        Weight:       209.4 lb Date of Birth:  Nov 29, 1945         BSA:          2.11 m Patient Age:    64 years          BP:           148/82 mmHg Patient Gender: F                 HR:           83 bpm. Exam Location:  Inpatient Procedure: 2D Echo, Cardiac Doppler, Color Doppler and Intracardiac            Opacification Agent Indications:    Atrial Fibrillation 427.31  History:        Patient has prior history of Echocardiogram examinations, most  recent 04/04/2017. Pulmonary HTN, Arrythmias:Atrial Fibrillation;                 Risk Factors:Hypertension, Diabetes, Dyslipidemia and Former                 Smoker. Sepsis. DOE.  Sonographer:    Paulita Fujita RDCS Referring Phys: [id number] KRISTEN D [place]  1. Left ventricular ejection fraction, by visual estimation, is 50 to 55%. The left ventricle has normal  function. There is no left ventricular hypertrophy.  2. Left ventricular diastolic parameters are indeterminate.  3. Global right ventricle has moderately reduced systolic function.The right ventricular size is mildly enlarged.  4. Left atrial size was normal.  5. Right atrial size was normal.  6. The mitral valve is normal in structure. Mild mitral valve regurgitation.  7. The tricuspid valve is normal in structure.  8. The aortic valve is tricuspid. Aortic valve regurgitation is not visualized. No evidence of aortic valve sclerosis or stenosis.  9. The pulmonic valve was not well visualized. Pulmonic valve regurgitation is not visualized. 10. The inferior vena cava is dilated in size with <50% respiratory variability, suggesting right atrial pressure of 15 mmHg. 11. The tricuspid regurgitant velocity is 3.34 m/s, and with an assumed right atrial pressure of 15 mmHg, the estimated right ventricular systolic pressure is moderately elevated at 59.6 mmHg. FINDINGS  Left Ventricle: Left ventricular ejection fraction, by visual estimation, is 50 to 55%. The left ventricle has normal function. Definity contrast agent was given IV to delineate the left ventricular endocardial borders. The left ventricle has no regional wall motion abnormalities. The left ventricular internal cavity size was the left ventricle is normal in size. There is no left ventricular hypertrophy. Left ventricular diastolic parameters are indeterminate. Right Ventricle: The right ventricular size is mildly enlarged. No increase in right ventricular wall thickness. Global RV systolic function is has moderately reduced systolic function. The tricuspid regurgitant velocity is 3.34 m/s, and with an assumed right atrial pressure of 15 mmHg, the estimated right ventricular systolic pressure is moderately elevated at 59.6 mmHg. Left Atrium: Left atrial size was normal in size. Right Atrium: Right atrial size was normal in size Pericardium: Trivial  pericardial effusion is present. Mitral Valve: The mitral valve is normal in structure. Mild mitral valve regurgitation. Tricuspid Valve: The tricuspid valve is normal in structure. Tricuspid valve regurgitation is trivial. Aortic Valve: The aortic valve is tricuspid. Aortic valve regurgitation is not visualized. The aortic valve is structurally normal, with no evidence of sclerosis or stenosis. Pulmonic Valve: The pulmonic valve was not well visualized. Pulmonic valve regurgitation is not visualized. Pulmonic regurgitation is not visualized. Aorta: The aortic root is normal in size and structure. Venous: The inferior vena cava is dilated in size with less than 50% respiratory variability, suggesting right atrial pressure of 15 mmHg. IAS/Shunts: The interatrial septum was not well visualized.  LEFT VENTRICLE PLAX 2D LVIDd:         4.75 cm LVIDs:         3.19 cm LV PW:         0.88 cm LV IVS:        0.88 cm LVOT diam:     1.90 cm LV SV:         64 ml LV SV Index:   29.51 LVOT Area:     2.84 cm  LV Volumes (MOD) LV area d, A2C:    34.50 cm LV area d, A4C:  30.30 cm LV area s, A2C:    22.00 cm LV area s, A4C:    17.80 cm LV major d, A2C:   8.09 cm LV major d, A4C:   7.90 cm LV major s, A2C:   7.54 cm LV major s, A4C:   6.55 cm LV vol d, MOD A2C: 122.0 ml LV vol d, MOD A4C: 99.8 ml LV vol s, MOD A2C: 59.4 ml LV vol s, MOD A4C: 42.4 ml LV SV MOD A2C:     62.6 ml LV SV MOD A4C:     99.8 ml LV SV MOD BP:      56.9 ml RIGHT VENTRICLE RV S prime:     7.05 cm/s TAPSE (M-mode): 1.5 cm LEFT ATRIUM             Index       RIGHT ATRIUM           Index LA diam:        3.70 cm 1.76 cm/m  RA Area:     15.80 cm LA Vol (A2C):   34.9 ml 16.56 ml/m RA Volume:   36.80 ml  17.47 ml/m LA Vol (A4C):   44.8 ml 21.26 ml/m LA Biplane Vol: 39.5 ml 18.75 ml/m  AORTIC VALVE LVOT Vmax:   75.10 cm/s LVOT Vmean:  53.200 cm/s LVOT VTI:    0.135 m  AORTA Ao Root diam: 3.10 cm TRICUSPID VALVE TR Peak grad:   44.6 mmHg TR Vmax:         334.00 cm/s  SHUNTS Systemic VTI:  0.14 m Systemic Diam: 1.90 cm  Oswaldo Milian MD Electronically signed by Oswaldo Milian MD Signature Date/Time: 09/22/2019/10:02:58 PM    Final     Microbiology: Recent Results (from the past 240 hour(s))  Blood Culture (routine x 2)     Status: None (Preliminary result)   Collection Time: 09/20/19  4:05 PM   Specimen: BLOOD  Result Value Ref Range Status   Specimen Description BLOOD LEFT ANTECUBITAL  Final   Special Requests   Final    BOTTLES DRAWN AEROBIC AND ANAEROBIC Blood Culture results may not be optimal due to an inadequate volume of blood received in culture bottles   Culture   Final    NO GROWTH 4 DAYS Performed at [facility], [address], [place], [place] [address]    Report Status PENDING  Incomplete  Blood Culture (routine x 2)     Status: None (Preliminary result)   Collection Time: 09/20/19  4:18 PM   Specimen: BLOOD LEFT FOREARM  Result Value Ref Range Status   Specimen Description BLOOD LEFT FOREARM  Final   Special Requests   Final    BOTTLES DRAWN AEROBIC AND ANAEROBIC Blood Culture results may not be optimal due to an inadequate volume of blood received in culture bottles   Culture   Final    NO GROWTH 4 DAYS Performed at [facility], [place] [address], [place], [place] [address]    Report Status PENDING  Incomplete  Respiratory Panel by RT PCR (Flu A&B, Covid) - Nasopharyngeal Swab     Status: None   Collection Time: 09/20/19  5:50 PM   Specimen: Nasopharyngeal Swab  Result Value Ref Range Status   SARS Coronavirus 2 by RT PCR NEGATIVE NEGATIVE Final    Comment: (NOTE) SARS-CoV-2 target nucleic acids are NOT DETECTED. The SARS-CoV-2 RNA is generally detectable in upper respiratoy specimens during the acute phase of infection.  The lowest concentration of SARS-CoV-2 viral copies this assay can detect is 131 copies/mL. A negative result does not preclude SARS-Cov-2 infection and should not be used  as the sole basis for treatment or other patient management decisions. A negative result may occur with  improper specimen collection/handling, submission of specimen other than nasopharyngeal swab, presence of viral mutation(s) within the areas targeted by this assay, and inadequate number of viral copies (<131 copies/mL). A negative result must be combined with clinical observations, patient history, and epidemiological information. The expected result is Negative. Fact Sheet for Patients:  PinkCheek.be Fact Sheet for Healthcare Providers:  GravelBags.it This test is not yet ap proved or cleared by the [place] FDA and  has been authorized for detection and/or diagnosis of SARS-CoV-2 by FDA under an Emergency Use Authorization (EUA). This EUA will remain  in effect (meaning this test can be used) for the duration of the COVID-19 declaration under Section 564(b)(1) of the Act, 21 U.S.C. section 360bbb-3(b)(1), unless the authorization is terminated or revoked sooner.    Influenza A by PCR NEGATIVE NEGATIVE Final   Influenza B by PCR NEGATIVE NEGATIVE Final    Comment: (NOTE) The Xpert Xpress SARS-CoV-2/FLU/RSV assay is intended as an aid in  the diagnosis of influenza from Nasopharyngeal swab specimens and  should not be used as a sole basis for treatment. Nasal washings and  aspirates are unacceptable for Xpert Xpress SARS-CoV-2/FLU/RSV  testing. Fact Sheet for Patients: PinkCheek.be Fact Sheet for Healthcare Providers: GravelBags.it This test is not yet approved or cleared by the [place] FDA and  has been authorized for detection and/or diagnosis of SARS-CoV-2 by  FDA under an Emergency Use Authorization (EUA). This EUA will remain  in effect (meaning this test can be used) for the duration of the  Covid-19 declaration under Section 564(b)(1) of the Act,  21  U.S.C. section 360bbb-3(b)(1), unless the authorization is  terminated or revoked. Performed at [facility], [place] [address] [place], [place] [address]   Urine culture     Status: Abnormal   Collection Time: 09/21/19  5:28 AM   Specimen: In/Out Cath Urine  Result Value Ref Range Status   Specimen Description IN/OUT CATH URINE  Final   Special Requests   Final    NONE Performed at [facility], [place] [address], [place], [place] [address]    Culture MULTIPLE SPECIES PRESENT, SUGGEST RECOLLECTION (A)  Final   Report Status 09/22/2019 FINAL  Final     Labs: Basic Metabolic Panel: Recent Labs  Lab 09/20/19 1621 09/21/19 0204 09/21/19 0528 09/21/19 1542 09/23/19 0310 09/24/19 0244  NA 137 139  --  140 143 141  K 4.0 4.5  --  4.2 3.8 4.2  CL 104 107  --  102 98 96*  CO2 17* 20*  --  25 34* 35*  GLUCOSE 173* 159*  --  218* 214* 176*  BUN 27* 30*  --  32* 28* 27*  CREATININE 2.37* 1.72* 1.63* 1.37* 0.88 0.94  CALCIUM 8.8* 8.1*  --  8.2* 8.5* 8.7*  MG  --  0.9*  --   --  2.0 1.6*  PHOS  --  4.5  --   --   --   --    Liver Function Tests: Recent Labs  Lab 09/20/19 1621 09/21/19 1542  AST 98* 37  ALT 69* 44  ALKPHOS 83 86  BILITOT 1.1 0.8  PROT 5.9* 5.9*  ALBUMIN 2.6* 2.9*  No results for input(s): LIPASE, AMYLASE in the last 168 hours. No results for input(s): AMMONIA in the last 168 hours. CBC: Recent Labs  Lab 09/20/19 1621 09/21/19 0204 09/21/19 0528 09/23/19 0310 09/24/19 0244  WBC 31.0* 32.9* 30.7* 22.6* 14.8*  NEUTROABS 29.1*  --   --  19.4* 9.3*  HGB 11.2* 10.2* 10.2* 9.7* 10.3*  HCT 35.0* 30.4* 30.7* 29.3* 32.5*  MCV 90.2 88.6 88.0 88.3 89.5  PLT 314 262 272 247 253   Cardiac Enzymes: Recent Labs  Lab 09/21/19 0528  CKTOTAL 36*  CKMB 9.2*   BNP: BNP (last 3 results) Recent Labs    09/21/19 0204  BNP 457.2*    ProBNP (last 3 results) No results for input(s): PROBNP in the last 8760 hours.  CBG: Recent Labs  Lab  09/23/19 0612 09/23/19 1129 09/23/19 1630 09/23/19 2228 09/24/19 0612  GLUCAP 196* 257* 261* 243* 159*       Signed:  Florencia Reasons MD, PhD, FACP  [facility] 09/24/2019, 10:26 AM

## 2019-09-24 NOTE — Telephone Encounter (Signed)
The pt is being discharged from the hosp today. We will call her on Monday 09/27/19.

## 2019-09-24 NOTE — Progress Notes (Addendum)
Progress Note  Patient Name: Debbie [name] Date of Encounter: 09/24/2019  Primary Cardiologist: Dr. Burt Knack  Subjective   Breathing back to baseline. No complains. Wants to go home.   Inpatient Medications    Scheduled Meds:  apixaban  5 mg Oral BID   cephALEXin  500 mg Oral Q12H   diltiazem  120 mg Oral Daily   feeding supplement (GLUCERNA SHAKE)  237 mL Oral TID BM   gabapentin  100 mg Oral TID   insulin aspart  0-5 Units Subcutaneous QHS   insulin aspart  0-9 Units Subcutaneous TID WC   loratadine  10 mg Oral Daily   multivitamin with minerals  1 tablet Oral Daily   pantoprazole  40 mg Oral QHS   Continuous Infusions:  sodium chloride Stopped (09/20/19 2317)   magnesium sulfate bolus IVPB 2 g (09/24/19 0839)   PRN Meds: alum & mag hydroxide-simeth   Vital Signs    Vitals:   09/23/19 2039 09/24/19 0019 09/24/19 0300 09/24/19 0755  BP: (!) 141/87 (!) 134/99 (!) 145/100 (!) 131/100  Pulse: 79 (!) 34 (!) 43 74  Resp: (!) 21 19 19 16   Temp: 97.6 F (36.4 C) (!) 97.5 F (36.4 C) (!) 97.5 F (36.4 C) 98.7 F (37.1 C)  TempSrc: Oral Oral Oral Oral  SpO2: 97% 97% 98% 100%  Weight:      Height:        Intake/Output Summary (Last 24 hours) at 09/24/2019 0919 Last data filed at 09/24/2019 0830 Gross per 24 hour  Intake 240 ml  Output 4350 ml  Net -4110 ml   Last 3 Weights 09/23/2019 09/21/2019 06/29/2019  Weight (lbs) 213 lb 10 oz 209 lb 7 oz 208 lb  Weight (kg) 96.9 kg 95 kg 94.348 kg      Telemetry    Afib at rate of 120s - Personally Reviewed  ECG    N/A  Physical Exam   GEN: No acute distress.   Neck: No JVD Cardiac: IR IR, no murmurs, rubs, or gallops.  Respiratory: Clear to auscultation bilaterally. GI: Soft, nontender, non-distended  MS: No edema;R leg prosthesis Neuro:  Nonfocal  Psych: Normal affect   Labs    High Sensitivity Troponin:   Recent Labs  Lab 09/21/19 0204 09/21/19 0528  TROPONINIHS 759* 679*       Chemistry Recent Labs  Lab 09/20/19 1621 09/21/19 1542 09/23/19 0310 09/24/19 0244  NA 137 140 143 141  K 4.0 4.2 3.8 4.2  CL 104 102 98 96*  CO2 17* 25 34* 35*  GLUCOSE 173* 218* 214* 176*  BUN 27* 32* 28* 27*  CREATININE 2.37* 1.37* 0.88 0.94  CALCIUM 8.8* 8.2* 8.5* 8.7*  PROT 5.9* 5.9*  --   --   ALBUMIN 2.6* 2.9*  --   --   AST 98* 37  --   --   ALT 69* 44  --   --   ALKPHOS 83 86  --   --   BILITOT 1.1 0.8  --   --   GFRNONAA 20* 38* >60 >60  GFRAA 23* 44* >60 >60  ANIONGAP 16* 13 11 10      Hematology Recent Labs  Lab 09/21/19 0528 09/23/19 0310 09/24/19 0244  WBC 30.7* 22.6* 14.8*  RBC 3.49* 3.32* 3.63*  HGB 10.2* 9.7* 10.3*  HCT 30.7* 29.3* 32.5*  MCV 88.0 88.3 89.5  MCH 29.2 29.2 28.4  MCHC 33.2 33.1 31.7  RDW 14.2 13.9 13.7  PLT  272 247 253    BNP Recent Labs  Lab 09/21/19 0204  BNP 457.2*     DDimer No results for input(s): DDIMER in the last 168 hours.   Radiology    ECHOCARDIOGRAM COMPLETE  Result Date: 09/22/2019   ECHOCARDIOGRAM REPORT   Patient Name:   Debbie [name] [facility] Date of Exam: 09/22/2019 Medical Rec #:  [id number]         Height:       69.0 in Accession #:    [id number]        Weight:       209.4 lb Date of Birth:  1946/02/23         BSA:          2.11 m Patient Age:    74 years          BP:           148/82 mmHg Patient Gender: F                 HR:           83 bpm. Exam Location:  Inpatient Procedure: 2D Echo, Cardiac Doppler, Color Doppler and Intracardiac            Opacification Agent Indications:    Atrial Fibrillation 427.31  History:        Patient has prior history of Echocardiogram examinations, most                 recent 04/04/2017. Pulmonary HTN, Arrythmias:Atrial Fibrillation;                 Risk Factors:Hypertension, Diabetes, Dyslipidemia and Former                 Smoker. Sepsis. DOE.  Sonographer:    Paulita Fujita RDCS Referring Phys: [id number] KRISTEN D [place]  1. Left ventricular ejection fraction, by  visual estimation, is 50 to 55%. The left ventricle has normal function. There is no left ventricular hypertrophy.  2. Left ventricular diastolic parameters are indeterminate.  3. Global right ventricle has moderately reduced systolic function.The right ventricular size is mildly enlarged.  4. Left atrial size was normal.  5. Right atrial size was normal.  6. The mitral valve is normal in structure. Mild mitral valve regurgitation.  7. The tricuspid valve is normal in structure.  8. The aortic valve is tricuspid. Aortic valve regurgitation is not visualized. No evidence of aortic valve sclerosis or stenosis.  9. The pulmonic valve was not well visualized. Pulmonic valve regurgitation is not visualized. 10. The inferior vena cava is dilated in size with <50% respiratory variability, suggesting right atrial pressure of 15 mmHg. 11. The tricuspid regurgitant velocity is 3.34 m/s, and with an assumed right atrial pressure of 15 mmHg, the estimated right ventricular systolic pressure is moderately elevated at 59.6 mmHg. FINDINGS  Left Ventricle: Left ventricular ejection fraction, by visual estimation, is 50 to 55%. The left ventricle has normal function. Definity contrast agent was given IV to delineate the left ventricular endocardial borders. The left ventricle has no regional wall motion abnormalities. The left ventricular internal cavity size was the left ventricle is normal in size. There is no left ventricular hypertrophy. Left ventricular diastolic parameters are indeterminate. Right Ventricle: The right ventricular size is mildly enlarged. No increase in right ventricular wall thickness. Global RV systolic function is has moderately reduced systolic function. The tricuspid regurgitant velocity is 3.34 m/s, and with an assumed right atrial pressure of  15 mmHg, the estimated right ventricular systolic pressure is moderately elevated at 59.6 mmHg. Left Atrium: Left atrial size was normal in size. Right Atrium:  Right atrial size was normal in size Pericardium: Trivial pericardial effusion is present. Mitral Valve: The mitral valve is normal in structure. Mild mitral valve regurgitation. Tricuspid Valve: The tricuspid valve is normal in structure. Tricuspid valve regurgitation is trivial. Aortic Valve: The aortic valve is tricuspid. Aortic valve regurgitation is not visualized. The aortic valve is structurally normal, with no evidence of sclerosis or stenosis. Pulmonic Valve: The pulmonic valve was not well visualized. Pulmonic valve regurgitation is not visualized. Pulmonic regurgitation is not visualized. Aorta: The aortic root is normal in size and structure. Venous: The inferior vena cava is dilated in size with less than 50% respiratory variability, suggesting right atrial pressure of 15 mmHg. IAS/Shunts: The interatrial septum was not well visualized.  LEFT VENTRICLE PLAX 2D LVIDd:         4.75 cm LVIDs:         3.19 cm LV PW:         0.88 cm LV IVS:        0.88 cm LVOT diam:     1.90 cm LV SV:         64 ml LV SV Index:   29.51 LVOT Area:     2.84 cm  LV Volumes (MOD) LV area d, A2C:    34.50 cm LV area d, A4C:    30.30 cm LV area s, A2C:    22.00 cm LV area s, A4C:    17.80 cm LV major d, A2C:   8.09 cm LV major d, A4C:   7.90 cm LV major s, A2C:   7.54 cm LV major s, A4C:   6.55 cm LV vol d, MOD A2C: 122.0 ml LV vol d, MOD A4C: 99.8 ml LV vol s, MOD A2C: 59.4 ml LV vol s, MOD A4C: 42.4 ml LV SV MOD A2C:     62.6 ml LV SV MOD A4C:     99.8 ml LV SV MOD BP:      56.9 ml RIGHT VENTRICLE RV S prime:     7.05 cm/s TAPSE (M-mode): 1.5 cm LEFT ATRIUM             Index       RIGHT ATRIUM           Index LA diam:        3.70 cm 1.76 cm/m  RA Area:     15.80 cm LA Vol (A2C):   34.9 ml 16.56 ml/m RA Volume:   36.80 ml  17.47 ml/m LA Vol (A4C):   44.8 ml 21.26 ml/m LA Biplane Vol: 39.5 ml 18.75 ml/m  AORTIC VALVE LVOT Vmax:   75.10 cm/s LVOT Vmean:  53.200 cm/s LVOT VTI:    0.135 m  AORTA Ao Root diam: 3.10 cm  TRICUSPID VALVE TR Peak grad:   44.6 mmHg TR Vmax:        334.00 cm/s  SHUNTS Systemic VTI:  0.14 m Systemic Diam: 1.90 cm  Oswaldo Milian MD Electronically signed by Oswaldo Milian MD Signature Date/Time: 09/22/2019/10:02:58 PM    Final     Cardiac Studies   Echo 09/22/2019  1. Left ventricular ejection fraction, by visual estimation, is 50 to 55%. The left ventricle has normal function. There is no left ventricular hypertrophy.  2. Left ventricular diastolic parameters are indeterminate.  3. Global right ventricle has moderately  reduced systolic function.The right ventricular size is mildly enlarged.  4. Left atrial size was normal.  5. Right atrial size was normal.  6. The mitral valve is normal in structure. Mild mitral valve regurgitation.  7. The tricuspid valve is normal in structure.  8. The aortic valve is tricuspid. Aortic valve regurgitation is not visualized. No evidence of aortic valve sclerosis or stenosis.  9. The pulmonic valve was not well visualized. Pulmonic valve regurgitation is not visualized. 10. The inferior vena cava is dilated in size with <50% respiratory variability, suggesting right atrial pressure of 15 mmHg. 11. The tricuspid regurgitant velocity is 3.34 m/s, and with an assumed right atrial pressure of 15 mmHg, the estimated right ventricular systolic pressure is moderately elevated at 59.6 mmHg.  Patient Profile     74 y.o. female with hx of COPD on oxygen, HTN, DM, HLD, pulmonary hypertension and s/pthromboangiitis obliterans R BKA in 1990 who admitted with sepsis/septic shock 2nd to UTI, AKi and metabolic encephalopathy.   Assessment & Plan    1. PAF with RVR -: Unknown duration of atrial fibrillation.  Difficult to know whether atrial fib was precipitated by urosepsis or whether she had this prior to getting sick.  HR is elevated this morning. She already got AM dose of cardizem. Will give another dose of 120mg  and increase to 240mg  daily starting  tomorrow. On Eliquis for anticoagulation. Plan cardioversion after 3 weeks of anticoagulation.   2. Acute diastolic CHF - Diuresed almost 3L yesterday. Net I &O positive due to volume resuscitation initially.   3. AKi  - Resolved  [facility] will sign off.   Medication Recommendations:  Lasix 20mg  PRN for dyspnea or edema, Eliquis 5mg  BID and Cardizem CD 240mg  QD Other recommendations (labs, testing, etc):  Outpatient cardioversion  Follow up as an outpatient:  Will arrange  For questions or updates, please contact [place] Please consult [email or web address] for contact info under   SignedLeanor Kail, PA  09/24/2019, 9:19 AM    Patient seen, examined. Available data reviewed. Agree with findings, assessment, and plan as outlined by Robbie Lis, PA.  The patient is independently evaluated and examined.  On my exam today, she is alert, oriented, in no distress.  Lungs are clear, heart is irregularly irregular with no murmur gallop, abdomen is soft and nontender, extremities have trace edema.  She is doing much better after diuresis yesterday.  I agree with the medication recommendations as outlined above with an increase in Cardizem CD to 240 mg daily.  She should continue on apixaban 5 mg twice daily and use Lasix on an as-needed basis after discharge.  We will plan to arrange elective cardioversion after her outpatient follow-up so that she has at least 3 weeks of therapeutic anticoagulation prior to cardioversion.  All of her questions are answered today and she appears stable from a cardiac perspective for discharge.  Sherren Mocha, M.D. 09/24/2019 10:02 AM

## 2019-09-24 NOTE — Telephone Encounter (Signed)
New Message  Per Robbie Lis, TOC Visit on 10/06/19 at 1:00 pm with Vin Bhagat.

## 2019-09-24 NOTE — Telephone Encounter (Signed)
New Message  Per Robbie Lis, TOC Visit on 10/06/19 at 1:00 pm with Vin Bhagat.Marland Kitchen

## 2019-09-24 NOTE — Progress Notes (Signed)
Discharge AVS meds take and those due reviewed with pt. Follow up appointments and when to call MD reviewed. All questions and concerns addressed. No further questions at this time. D/c IV and TELE, CCMD notified. D/C home per orders.  

## 2019-09-24 NOTE — Telephone Encounter (Signed)
The pt is being discharged from the hospital today. We will call her on Monday 09/27/19.

## 2019-09-25 LAB — CULTURE, BLOOD (ROUTINE X 2)
Culture: NO GROWTH
Culture: NO GROWTH

## 2019-09-27 NOTE — Telephone Encounter (Signed)
  Patient contacted regarding discharge from [facility] on 09-24-2019.  Patient understands to follow up with provider BHAGAHT on 10/06/2019 at 1PM at [facility]. Patient understands discharge instructions? YES  Patient understands medications and regiment? YES Patient understands to bring all medications to this visit? YES  Pt states her home health nurse is currently at her house and assisting her as needed. Pt aware of upcoming appt.

## 2019-10-05 NOTE — Progress Notes (Signed)
Cardiology Office Note    Date:  10/06/2019   ID:  Debbie [name], DOB 05/01/1946, MRN [id number]  PCP:  [name], MD  Cardiologist:  Dr. Burt Knack   Chief Complaint: Hospital follow up   History of Present Illness:   Debbie [name] is a 74 y.o. female with hx of COPD on oxygen, HTN, DM, HLD, pulmonary hypertension and s/pthromboangiitis obliterans R BKA in 1990 who admitted 09/2019 with sepsis/septic shock 2nd to UTI, AKi and metabolic encephalopathy. Noted unknown duration of atrial fibrillation. Difficult to know whether atrial fib was precipitated by urosepsis or whether she had this prior to getting sick. Started Eliquis. Plan cardioversion after 3 weeks of anticoagulation.  Echo showed LVEF of 50-55% and grade 2 DD.   Here today for follow up.  Compliant with her medication.  She denies chest pain, shortness of breath, orthopnea, PND, syncope, lower extremity edema or melena.  Wants to sign off to get vaccine.  Use 2 L oxygen 24/7.  Past Medical History:  Diagnosis Date  . Adenomatous polyp 12/04/2006  . Asthma   . COPD (chronic obstructive pulmonary disease) ([place])   . DM type 2 (diabetes mellitus, type 2) ([place])   . Hemorrhoid 12/04/2006  . Hyperlipidemia   . Hypertension   . Peripheral arterial disease ([place])   . Sepsis ([place]) 09/22/2019    Past Surgical History:  Procedure Laterality Date  . CHOLECYSTECTOMY    . COLONOSCOPY  12/03/2006   Dr. Delight Ovens, adenomatous polyp  . COLONOSCOPY  03/25/2012   Procedure: COLONOSCOPY;  Surgeon: Daneil Dolin, MD;  [facility];  Service: Endoscopy;  Laterality: N/A;  10:30  . ESOPHAGOGASTRODUODENOSCOPY  11/03/2002   Dr. Gala Romney- normal exam- was done to check for possible foreign body  . Fiberoptic bronchoscopy with endobronchial  ultrasound  10/22/2010   [place]  . Right BKA  1990  . RIGHT HEART CATHETERIZATION N/A 06/22/2014   Procedure: RIGHT HEART CATH;  Surgeon: Larey Dresser, MD;  Location: [facility] CATH  LAB;  Service: Cardiovascular;  Laterality: N/A;    Current Medications: Prior to Admission medications   Medication Sig Start Date End Date Taking? Authorizing Provider  apixaban (ELIQUIS) 5 MG TABS tablet Take 1 tablet (5 mg total) by mouth 2 (two) times daily. 09/24/19   Florencia Reasons, MD  BD PEN NEEDLE NANO U/F 32G X 4 MM MISC 2 (two) times daily. as directed 02/26/15   [provider]  Calcium Carb-Cholecalciferol (CALCIUM 1000 + D PO) Take 1,000 mg by mouth daily.    [provider]  cetirizine (ZYRTEC) 10 MG tablet Take 10 mg by mouth daily as needed for allergies.     [provider]  Coenzyme Q10 (CO Q 10 PO) Take 300 mg by mouth daily.    [provider]  dexlansoprazole (DEXILANT) 60 MG capsule Take 1 capsule (60 mg total) by mouth daily. 09/29/15   Tanda Rockers, MD  diltiazem (CARDIZEM CD) 240 MG 24 hr capsule Take 1 capsule (240 mg total) by mouth daily. 09/25/19   Florencia Reasons, MD  ezetimibe (ZETIA) 10 MG tablet Take 10 mg by mouth daily. 08/27/19   [provider]  fluticasone (FLONASE) 50 MCG/ACT nasal spray Place 1 spray into the nose daily as needed for allergies.     [provider]  furosemide (LASIX) 40 MG tablet Take 0.5 tablets (20 mg total) by mouth daily as needed for fluid or edema. 09/24/19 10/24/19  Erlinda Hong,  Annamaria Boots, MD  gabapentin (NEURONTIN) 100 MG capsule Take 100 mg by mouth 3 (three) times daily.    [provider]  halobetasol (ULTRAVATE) 0.05 % cream Apply 1 application topically 2 (two) times daily.  08/15/16   [provider]  ibandronate (BONIVA) 150 MG tablet Take 150 mg by mouth every 30 (thirty) days. 08/19/19 02/15/20  [provider]  ibuprofen (ADVIL,MOTRIN) 200 MG tablet Take 600 mg by mouth every 6 (six) hours as needed for headache or mild pain.     [provider]  Insulin Glargine (BASAGLAR KWIKPEN) 100 UNIT/ML SOPN Inject 45 Units into the skin daily.     [provider]    Insulin Pen Needle (BD PEN NEEDLE NANO U/F) 32G X 4 MM MISC USE TWICE DAILY AS DIRECTED 05/31/15   [provider]  losartan (COZAAR) 100 MG tablet Take 0.5 tablets (50 mg total) by mouth daily. 09/24/19   Florencia Reasons, MD  OXYGEN Place 3 L into the nose See admin instructions. 3 lpm with sleep and exertion  APS     [provider]  Probiotic Product (PROBIOTIC ADVANCED PO) Take 1 tablet by mouth daily.     [provider]  SitaGLIPtin-MetFORMIN HCl (JANUMET XR) 50-1000 MG TB24 Take 1 tablet by mouth 2 (two) times daily.  01/10/14   [provider]  VOLTAREN 1 % GEL Apply 2 g topically 4 (four) times daily as needed (pain).  08/13/17   [provider]    Allergies:   Meloxicam   Social History   Socioeconomic History  . Marital status: Married    Spouse name: Debbie [name]  . Number of children: 3  . Years of education: Not on file  . Highest education level: Not on file  Occupational History  . Occupation: retired    Fish farm manager: UNEMPLOYED  Tobacco Use  . Smoking status: Former Smoker    Packs/day: 0.50    Years: 18.00    Pack years: 9.00    Types: Cigarettes    Quit date: 09/16/1990    Years since quitting: 29.0  . Smokeless tobacco: Never Used  Substance and Sexual Activity  . Alcohol use: No    Alcohol/week: 0.0 standard drinks  . Drug use: No  . Sexual activity: Not on file  Other Topics Concern  . Not on file  Social History Narrative  . Not on file   Social Determinants of Health   Financial Resource Strain:   . Difficulty of Paying Living Expenses: Not on file  Food Insecurity:   . Worried About Charity fundraiser in the Last Year: Not on file  . Ran Out of Food in the Last Year: Not on file  Transportation Needs:   . Lack of Transportation (Medical): Not on file  . Lack of Transportation (Non-Medical): Not on file  Physical Activity:   . Days of Exercise per Week: Not on file  . Minutes of Exercise per Session: Not on  file  Stress:   . Feeling of Stress : Not on file  Social Connections:   . Frequency of Communication with Friends and Family: Not on file  . Frequency of Social Gatherings with Friends and Family: Not on file  . Attends Religious Services: Not on file  . Active Member of Clubs or Organizations: Not on file  . Attends Archivist Meetings: Not on file  . Marital Status: Not on file     Family History:  The patient's  family history includes Breast cancer in her sister; COPD in her sister; CVA (age of onset: 3) in her mother; Diabetes in her sister; Diabetes (age of onset: 18) in her mother; Heart disease in her mother; Lung cancer in her father.  ROS:   Please see the history of present illness.    ROS All other systems reviewed and are negative.   PHYSICAL EXAM:   VS:  BP (!) 150/90   Pulse 79   Ht 5\' 9"  (1.753 m)   Wt 202 lb 12.8 oz (92 kg)   SpO2 95%   BMI 29.95 kg/m    GEN: Well nourished, well developed, in no acute distress  HEENT: normal  Neck: no JVD, carotid bruits, or masses Cardiac: Irregularly irregular; no murmurs, rubs, or gallops,no edema  Respiratory:  clear to auscultation bilaterally, normal work of breathing GI: soft, nontender, nondistended, + BS MS: has R side prosthesis  Skin: warm and dry, no rash Neuro:  Alert and Oriented x 3, Strength and sensation are intact Psych: euthymic mood, full affect  Wt Readings from Last 3 Encounters:  10/06/19 202 lb 12.8 oz (92 kg)  09/23/19 213 lb 10 oz (96.9 kg)  06/29/19 208 lb (94.3 kg)      Studies/Labs Reviewed:   EKG:  EKG is ordered today.  The ekg ordered today demonstrates atrial fibrillation at rate of 79 bpm  Recent Labs: 09/20/2019: TSH 1.157 09/21/2019: ALT 44; B Natriuretic Peptide 457.2 09/24/2019: BUN 27; Creatinine, Ser 0.94; Hemoglobin 10.3; Magnesium 1.6; Platelets 253; Potassium 4.2; Sodium 141   Lipid Panel    Component Value Date/Time   CHOL 147 08/17/2014 0840   TRIG 84.0  08/17/2014 0840   HDL 51.30 08/17/2014 0840   CHOLHDL 3 08/17/2014 0840   VLDL 16.8 08/17/2014 0840   LDLCALC 79 08/17/2014 0840    Additional studies/ records that were reviewed today include:   Echo 09/22/2019 1. Left ventricular ejection fraction, by visual estimation, is 50 to 55%. The left ventricle has normal function. There is no left ventricular hypertrophy. 2. Left ventricular diastolic parameters are indeterminate. 3. Global right ventricle has moderately reduced systolic function.The right ventricular size is mildly enlarged. 4. Left atrial size was normal. 5. Right atrial size was normal. 6. The mitral valve is normal in structure. Mild mitral valve regurgitation. 7. The tricuspid valve is normal in structure. 8. The aortic valve is tricuspid. Aortic valve regurgitation is not visualized. No evidence of aortic valve sclerosis or stenosis. 9. The pulmonic valve was not well visualized. Pulmonic valve regurgitation is not visualized. 10. The inferior vena cava is dilated in size with <50% respiratory variability, suggesting right atrial pressure of 15 mmHg. 11. The tricuspid regurgitant velocity is 3.34 m/s, and with an assumed right atrial pressure of 15 mmHg, the estimated right ventricular systolic pressure is moderately elevated at 59.6 mmHg.    ASSESSMENT & PLAN:    1. Persistent atrial fibrillation  -Rate controlled.  Asymptomatic.  Compliant with Eliquis and Cardizem.  No bleeding issue.  Will schedule cardioversion.  Risk and benefits reviewed.  Patient willing to proceed.  2.  Hypertension -Elevated.  Advised to keep log.  Continue home losartan and Cardizem CD.  Only required to take Lasix once since discharge.  3.  AKI -Check BMET  4. DM - Per PCP  5. Back pain -She is dealing with severe back pain issue since discharge.  Has appointment with PCP next week.  Discussed if she requires an injection  in her back she needs to wait at least 4 weeks post  cardioversion.  She is agree with plan.  Medication Adjustments/Labs and Tests Ordered: Current medicines are reviewed at length with the patient today.  Concerns regarding medicines are outlined above.  Medication changes, Labs and Tests ordered today are listed in the Patient Instructions below. Patient Instructions  Medication Instructions:  Your physician recommends that you continue on your current medications as directed. Please refer to the Current Medication list given to you today.  *If you need a refill on your cardiac medications before your next appointment, please call your pharmacy*  Lab Work: TODAY:  BMET, CBC, & PRO BNP  If you have labs (blood work) drawn today and your tests are completely normal, you will receive your results only by: Marland Kitchen MyChart Message (if you have MyChart) OR . A paper copy in the mail If you have any lab test that is abnormal or we need to change your treatment, we will call you to review the results.  Testing/Procedures: Your physician has recommended that you have a Cardioversion (DCCV). Electrical Cardioversion uses a jolt of electricity to your heart either through paddles or wired patches attached to your chest. This is a controlled, usually prescheduled, procedure. Defibrillation is done under light anesthesia in the hospital, and you usually go home the day of the procedure. This is done to get your heart back into a normal rhythm. You are not awake for the procedure. Please see the instruction sheet given to you today.   Follow-Up: At [facility], you and your health needs are our priority.  As part of our continuing mission to provide you with exceptional heart care, we have created designated Provider Care Teams.  These Care Teams include your primary Cardiologist (physician) and Advanced Practice Providers (APPs -  Physician Assistants and Nurse Practitioners) who all work together to provide you with the care you need, when you need  it.  Your next appointment:   11/01/2019 ARRIVE AT 1:30 TO SEE VIN Tristram Milian, PA-C  The format for your next appointment:   In Person  Provider:   Robbie Lis, PA-C  Other Instructions      Signed, Leanor Kail, PA  10/06/2019 1:44 PM    [facility] [address], [place], [place]  [address] Phone: [phone]; Fax: [phone]

## 2019-10-05 NOTE — H&P (View-Only) (Signed)
Cardiology Office Note    Date:  10/06/2019   ID:  Debbie [name], DOB [birth year], MRN [id number]  PCP:  [name], MD  Cardiologist:  Dr. Burt Knack   Chief Complaint: Hospital follow up   History of Present Illness:   Debbie [name] is a 74 y.o. female with hx of COPD on oxygen, HTN, DM, HLD, pulmonary hypertension and s/pthromboangiitis obliterans R BKA in 1990 who admitted 09/2019 with sepsis/septic shock 2nd to UTI, AKi and metabolic encephalopathy. Noted unknown duration of atrial fibrillation. Difficult to know whether atrial fib was precipitated by urosepsis or whether she had this prior to getting sick. Started Eliquis. Plan cardioversion after 3 weeks of anticoagulation.  Echo showed LVEF of 50-55% and grade 2 DD.   Here today for follow up.  Compliant with her medication.  She denies chest pain, shortness of breath, orthopnea, PND, syncope, lower extremity edema or melena.  Wants to sign off to get vaccine.  Use 2 L oxygen 24/7.  Past Medical History:  Diagnosis Date  . Adenomatous polyp 12/04/2006  . Asthma   . COPD (chronic obstructive pulmonary disease) ([place])   . DM type 2 (diabetes mellitus, type 2) ([place])   . Hemorrhoid 12/04/2006  . Hyperlipidemia   . Hypertension   . Peripheral arterial disease ([place])   . Sepsis ([place]) 09/22/2019    Past Surgical History:  Procedure Laterality Date  . CHOLECYSTECTOMY    . COLONOSCOPY  12/03/2006   Dr. Delight Ovens, adenomatous polyp  . COLONOSCOPY  03/25/2012   Procedure: COLONOSCOPY;  Surgeon: Daneil Dolin, MD;  [facility];  Service: Endoscopy;  Laterality: N/A;  10:30  . ESOPHAGOGASTRODUODENOSCOPY  11/03/2002   Dr. Gala Romney- normal exam- was done to check for possible foreign body  . Fiberoptic bronchoscopy with endobronchial  ultrasound  10/22/2010   [place]  . Right BKA  1990  . RIGHT HEART CATHETERIZATION N/A 06/22/2014   Procedure: RIGHT HEART CATH;  Surgeon: Larey Dresser, MD;  Location: [facility] CATH  LAB;  Service: Cardiovascular;  Laterality: N/A;    Current Medications: Prior to Admission medications   Medication Sig Start Date End Date Taking? Authorizing Provider  apixaban (ELIQUIS) 5 MG TABS tablet Take 1 tablet (5 mg total) by mouth 2 (two) times daily. 09/24/19   Florencia Reasons, MD  BD PEN NEEDLE NANO U/F 32G X 4 MM MISC 2 (two) times daily. as directed 02/26/15   [provider]  Calcium Carb-Cholecalciferol (CALCIUM 1000 + D PO) Take 1,000 mg by mouth daily.    [provider]  cetirizine (ZYRTEC) 10 MG tablet Take 10 mg by mouth daily as needed for allergies.     [provider]  Coenzyme Q10 (CO Q 10 PO) Take 300 mg by mouth daily.    [provider]  dexlansoprazole (DEXILANT) 60 MG capsule Take 1 capsule (60 mg total) by mouth daily. 09/29/15   Tanda Rockers, MD  diltiazem (CARDIZEM CD) 240 MG 24 hr capsule Take 1 capsule (240 mg total) by mouth daily. 09/25/19   Florencia Reasons, MD  ezetimibe (ZETIA) 10 MG tablet Take 10 mg by mouth daily. 08/27/19   [provider]  fluticasone (FLONASE) 50 MCG/ACT nasal spray Place 1 spray into the nose daily as needed for allergies.     [provider]  furosemide (LASIX) 40 MG tablet Take 0.5 tablets (20 mg total) by mouth daily as needed for fluid or edema. 09/24/19 10/24/19  Erlinda Hong,  Annamaria Boots, MD  gabapentin (NEURONTIN) 100 MG capsule Take 100 mg by mouth 3 (three) times daily.    [provider]  halobetasol (ULTRAVATE) 0.05 % cream Apply 1 application topically 2 (two) times daily.  08/15/16   [provider]  ibandronate (BONIVA) 150 MG tablet Take 150 mg by mouth every 30 (thirty) days. 08/19/19 02/15/20  [provider]  ibuprofen (ADVIL,MOTRIN) 200 MG tablet Take 600 mg by mouth every 6 (six) hours as needed for headache or mild pain.     [provider]  Insulin Glargine (BASAGLAR KWIKPEN) 100 UNIT/ML SOPN Inject 45 Units into the skin daily.     [provider]    Insulin Pen Needle (BD PEN NEEDLE NANO U/F) 32G X 4 MM MISC USE TWICE DAILY AS DIRECTED 05/31/15   [provider]  losartan (COZAAR) 100 MG tablet Take 0.5 tablets (50 mg total) by mouth daily. 09/24/19   Florencia Reasons, MD  OXYGEN Place 3 L into the nose See admin instructions. 3 lpm with sleep and exertion  APS     [provider]  Probiotic Product (PROBIOTIC ADVANCED PO) Take 1 tablet by mouth daily.     [provider]  SitaGLIPtin-MetFORMIN HCl (JANUMET XR) 50-1000 MG TB24 Take 1 tablet by mouth 2 (two) times daily.  01/10/14   [provider]  VOLTAREN 1 % GEL Apply 2 g topically 4 (four) times daily as needed (pain).  08/13/17   [provider]    Allergies:   Meloxicam   Social History   Socioeconomic History  . Marital status: Married    Spouse name: Benny Slota  . Number of children: 3  . Years of education: Not on file  . Highest education level: Not on file  Occupational History  . Occupation: retired    Fish farm manager: UNEMPLOYED  Tobacco Use  . Smoking status: Former Smoker    Packs/day: 0.50    Years: 18.00    Pack years: 9.00    Types: Cigarettes    Quit date: 09/16/1990    Years since quitting: 29.0  . Smokeless tobacco: Never Used  Substance and Sexual Activity  . Alcohol use: No    Alcohol/week: 0.0 standard drinks  . Drug use: No  . Sexual activity: Not on file  Other Topics Concern  . Not on file  Social History Narrative  . Not on file   Social Determinants of Health   Financial Resource Strain:   . Difficulty of Paying Living Expenses: Not on file  Food Insecurity:   . Worried About Charity fundraiser in the Last Year: Not on file  . Ran Out of Food in the Last Year: Not on file  Transportation Needs:   . Lack of Transportation (Medical): Not on file  . Lack of Transportation (Non-Medical): Not on file  Physical Activity:   . Days of Exercise per Week: Not on file  . Minutes of Exercise per Session: Not on  file  Stress:   . Feeling of Stress : Not on file  Social Connections:   . Frequency of Communication with Friends and Family: Not on file  . Frequency of Social Gatherings with Friends and Family: Not on file  . Attends Religious Services: Not on file  . Active Member of Clubs or Organizations: Not on file  . Attends Archivist Meetings: Not on file  . Marital Status: Not on file     Family History:  The patient's  family history includes Breast cancer in her sister; COPD in her sister; CVA (age of onset: 30) in her mother; Diabetes in her sister; Diabetes (age of onset: 71) in her mother; Heart disease in her mother; Lung cancer in her father.  ROS:   Please see the history of present illness.    ROS All other systems reviewed and are negative.   PHYSICAL EXAM:   VS:  BP (!) 150/90   Pulse 79   Ht 5\' 9"  (1.753 m)   Wt 202 lb 12.8 oz (92 kg)   SpO2 95%   BMI 29.95 kg/m    GEN: Well nourished, well developed, in no acute distress  HEENT: normal  Neck: no JVD, carotid bruits, or masses Cardiac: Irregularly irregular; no murmurs, rubs, or gallops,no edema  Respiratory:  clear to auscultation bilaterally, normal work of breathing GI: soft, nontender, nondistended, + BS MS: has R side prosthesis  Skin: warm and dry, no rash Neuro:  Alert and Oriented x 3, Strength and sensation are intact Psych: euthymic mood, full affect  Wt Readings from Last 3 Encounters:  10/06/19 202 lb 12.8 oz (92 kg)  09/23/19 213 lb 10 oz (96.9 kg)  06/29/19 208 lb (94.3 kg)      Studies/Labs Reviewed:   EKG:  EKG is ordered today.  The ekg ordered today demonstrates atrial fibrillation at rate of 79 bpm  Recent Labs: 09/20/2019: TSH 1.157 09/21/2019: ALT 44; B Natriuretic Peptide 457.2 09/24/2019: BUN 27; Creatinine, Ser 0.94; Hemoglobin 10.3; Magnesium 1.6; Platelets 253; Potassium 4.2; Sodium 141   Lipid Panel    Component Value Date/Time   CHOL 147 08/17/2014 0840   TRIG 84.0  08/17/2014 0840   HDL 51.30 08/17/2014 0840   CHOLHDL 3 08/17/2014 0840   VLDL 16.8 08/17/2014 0840   LDLCALC 79 08/17/2014 0840    Additional studies/ records that were reviewed today include:   Echo 09/22/2019 1. Left ventricular ejection fraction, by visual estimation, is 50 to 55%. The left ventricle has normal function. There is no left ventricular hypertrophy. 2. Left ventricular diastolic parameters are indeterminate. 3. Global right ventricle has moderately reduced systolic function.The right ventricular size is mildly enlarged. 4. Left atrial size was normal. 5. Right atrial size was normal. 6. The mitral valve is normal in structure. Mild mitral valve regurgitation. 7. The tricuspid valve is normal in structure. 8. The aortic valve is tricuspid. Aortic valve regurgitation is not visualized. No evidence of aortic valve sclerosis or stenosis. 9. The pulmonic valve was not well visualized. Pulmonic valve regurgitation is not visualized. 10. The inferior vena cava is dilated in size with <50% respiratory variability, suggesting right atrial pressure of 15 mmHg. 11. The tricuspid regurgitant velocity is 3.34 m/s, and with an assumed right atrial pressure of 15 mmHg, the estimated right ventricular systolic pressure is moderately elevated at 59.6 mmHg.    ASSESSMENT & PLAN:    1. Persistent atrial fibrillation  -Rate controlled.  Asymptomatic.  Compliant with Eliquis and Cardizem.  No bleeding issue.  Will schedule cardioversion.  Risk and benefits reviewed.  Patient willing to proceed.  2.  Hypertension -Elevated.  Advised to keep log.  Continue home losartan and Cardizem CD.  Only required to take Lasix once since discharge.  3.  AKI -Check BMET  4. DM - Per PCP  5. Back pain -She is dealing with severe back pain issue since discharge.  Has appointment with PCP next week.  Discussed if she requires an injection  in her back she needs to wait at least 4 weeks post  cardioversion.  She is agree with plan.  Medication Adjustments/Labs and Tests Ordered: Current medicines are reviewed at length with the patient today.  Concerns regarding medicines are outlined above.  Medication changes, Labs and Tests ordered today are listed in the Patient Instructions below. Patient Instructions  Medication Instructions:  Your physician recommends that you continue on your current medications as directed. Please refer to the Current Medication list given to you today.  *If you need a refill on your cardiac medications before your next appointment, please call your pharmacy*  Lab Work: TODAY:  BMET, CBC, & PRO BNP  If you have labs (blood work) drawn today and your tests are completely normal, you will receive your results only by: Marland Kitchen MyChart Message (if you have MyChart) OR . A paper copy in the mail If you have any lab test that is abnormal or we need to change your treatment, we will call you to review the results.  Testing/Procedures: Your physician has recommended that you have a Cardioversion (DCCV). Electrical Cardioversion uses a jolt of electricity to your heart either through paddles or wired patches attached to your chest. This is a controlled, usually prescheduled, procedure. Defibrillation is done under light anesthesia in the hospital, and you usually go home the day of the procedure. This is done to get your heart back into a normal rhythm. You are not awake for the procedure. Please see the instruction sheet given to you today.   Follow-Up: At [facility], you and your health needs are our priority.  As part of our continuing mission to provide you with exceptional heart care, we have created designated Provider Care Teams.  These Care Teams include your primary Cardiologist (physician) and Advanced Practice Providers (APPs -  Physician Assistants and Nurse Practitioners) who all work together to provide you with the care you need, when you need  it.  Your next appointment:   11/01/2019 ARRIVE AT 1:30 TO SEE VIN Saron Vanorman, PA-C  The format for your next appointment:   In Person  Provider:   Robbie Lis, PA-C  Other Instructions      Signed, Leanor Kail, PA  10/06/2019 1:44 PM    [facility] [address], [place], [place]  [address] Phone: [phone]; Fax: [phone]

## 2019-10-06 ENCOUNTER — Encounter: Payer: Self-pay | Admitting: Physician Assistant

## 2019-10-06 ENCOUNTER — Encounter: Payer: Self-pay | Admitting: *Deleted

## 2019-10-06 ENCOUNTER — Ambulatory Visit (INDEPENDENT_AMBULATORY_CARE_PROVIDER_SITE_OTHER): Payer: Medicare Other | Admitting: Physician Assistant

## 2019-10-06 ENCOUNTER — Other Ambulatory Visit: Payer: Self-pay

## 2019-10-06 VITALS — BP 150/90 | HR 79 | Ht 69.0 in | Wt 202.8 lb

## 2019-10-06 DIAGNOSIS — E119 Type 2 diabetes mellitus without complications: Secondary | ICD-10-CM

## 2019-10-06 DIAGNOSIS — I10 Essential (primary) hypertension: Secondary | ICD-10-CM

## 2019-10-06 DIAGNOSIS — I4891 Unspecified atrial fibrillation: Secondary | ICD-10-CM

## 2019-10-06 DIAGNOSIS — I272 Pulmonary hypertension, unspecified: Secondary | ICD-10-CM

## 2019-10-06 DIAGNOSIS — N179 Acute kidney failure, unspecified: Secondary | ICD-10-CM | POA: Diagnosis not present

## 2019-10-06 DIAGNOSIS — M545 Low back pain, unspecified: Secondary | ICD-10-CM

## 2019-10-06 NOTE — Patient Instructions (Addendum)
Medication Instructions:  Your physician recommends that you continue on your current medications as directed. Please refer to the Current Medication list given to you today.  *If you need a refill on your cardiac medications before your next appointment, please call your pharmacy*  Lab Work: TODAY:  BMET, CBC, & PRO BNP  If you have labs (blood work) drawn today and your tests are completely normal, you will receive your results only by: Marland Kitchen MyChart Message (if you have MyChart) OR . A paper copy in the mail If you have any lab test that is abnormal or we need to change your treatment, we will call you to review the results.  Testing/Procedures: Your physician has recommended that you have a Cardioversion (DCCV). Electrical Cardioversion uses a jolt of electricity to your heart either through paddles or wired patches attached to your chest. This is a controlled, usually prescheduled, procedure. Defibrillation is done under light anesthesia in the hospital, and you usually go home the day of the procedure. This is done to get your heart back into a normal rhythm. You are not awake for the procedure. Please see the instruction sheet given to you today.   Follow-Up: At [facility], you and your health needs are our priority.  As part of our continuing mission to provide you with exceptional heart care, we have created designated Provider Care Teams.  These Care Teams include your primary Cardiologist (physician) and Advanced Practice Providers (APPs -  Physician Assistants and Nurse Practitioners) who all work together to provide you with the care you need, when you need it.  Your next appointment:   11/01/2019 ARRIVE AT 1:30 TO SEE VIN BHAGAT, PA-C  The format for your next appointment:   In Person  Provider:   Robbie Lis, PA-C  Other Instructions

## 2019-10-07 LAB — BASIC METABOLIC PANEL
BUN/Creatinine Ratio: 16 (ref 12–28)
BUN: 12 mg/dL (ref 8–27)
CO2: 25 mmol/L (ref 20–29)
Calcium: 10.2 mg/dL (ref 8.7–10.3)
Chloride: 101 mmol/L (ref 96–106)
Creatinine, Ser: 0.73 mg/dL (ref 0.57–1.00)
GFR calc Af Amer: 94 mL/min/{1.73_m2} (ref >59)
GFR calc non Af Amer: 82 mL/min/{1.73_m2} (ref >59)
Glucose: 123 mg/dL — ABNORMAL HIGH (ref 65–99)
Potassium: 4.7 mmol/L (ref 3.5–5.2)
Sodium: 139 mmol/L (ref 134–144)

## 2019-10-07 LAB — CBC
Hematocrit: 37.1 % (ref 34.0–46.6)
Hemoglobin: 11.7 g/dL (ref 11.1–15.9)
MCH: 28.3 pg (ref 26.6–33.0)
MCHC: 31.5 g/dL (ref 31.5–35.7)
MCV: 90 fL (ref 79–97)
Platelets: 380 10*3/uL (ref 150–450)
RBC: 4.13 x10E6/uL (ref 3.77–5.28)
RDW: 13.2 % (ref 11.7–15.4)
WBC: 12.9 10*3/uL — ABNORMAL HIGH (ref 3.4–10.8)

## 2019-10-14 ENCOUNTER — Other Ambulatory Visit (HOSPITAL_COMMUNITY)
Admission: RE | Admit: 2019-10-14 | Discharge: 2019-10-14 | Disposition: A | Discharge status: Home / Self Care | Payer: Medicare Other | Source: Ambulatory Visit | Attending: Cardiovascular Disease | Admitting: Cardiovascular Disease

## 2019-10-14 DIAGNOSIS — Z20822 Contact with and (suspected) exposure to covid-19: Secondary | ICD-10-CM | POA: Diagnosis not present

## 2019-10-14 DIAGNOSIS — Z01812 Encounter for preprocedural laboratory examination: Secondary | ICD-10-CM | POA: Diagnosis present

## 2019-10-14 LAB — SARS CORONAVIRUS 2 (TAT 6-24 HRS): SARS Coronavirus 2: NEGATIVE

## 2019-10-18 ENCOUNTER — Encounter (HOSPITAL_COMMUNITY)
Admission: RE | Disposition: A | Discharge status: Home / Self Care | Payer: Medicare Other | Source: Home / Self Care | Attending: Cardiovascular Disease

## 2019-10-18 ENCOUNTER — Ambulatory Visit (HOSPITAL_COMMUNITY): Payer: Medicare Other

## 2019-10-18 ENCOUNTER — Other Ambulatory Visit: Payer: Self-pay

## 2019-10-18 ENCOUNTER — Ambulatory Visit (HOSPITAL_COMMUNITY)
Admission: RE | Admit: 2019-10-18 | Discharge: 2019-10-18 | Disposition: A | Discharge status: Home / Self Care | Payer: Medicare Other | Attending: Cardiovascular Disease | Admitting: Cardiovascular Disease

## 2019-10-18 DIAGNOSIS — Z7901 Long term (current) use of anticoagulants: Secondary | ICD-10-CM | POA: Insufficient documentation

## 2019-10-18 DIAGNOSIS — N179 Acute kidney failure, unspecified: Secondary | ICD-10-CM | POA: Insufficient documentation

## 2019-10-18 DIAGNOSIS — Z87891 Personal history of nicotine dependence: Secondary | ICD-10-CM | POA: Insufficient documentation

## 2019-10-18 DIAGNOSIS — M549 Dorsalgia, unspecified: Secondary | ICD-10-CM | POA: Insufficient documentation

## 2019-10-18 DIAGNOSIS — Z8249 Family history of ischemic heart disease and other diseases of the circulatory system: Secondary | ICD-10-CM | POA: Diagnosis not present

## 2019-10-18 DIAGNOSIS — Z9981 Dependence on supplemental oxygen: Secondary | ICD-10-CM | POA: Diagnosis not present

## 2019-10-18 DIAGNOSIS — Z888 Allergy status to other drugs, medicaments and biological substances status: Secondary | ICD-10-CM | POA: Diagnosis not present

## 2019-10-18 DIAGNOSIS — Z794 Long term (current) use of insulin: Secondary | ICD-10-CM | POA: Insufficient documentation

## 2019-10-18 DIAGNOSIS — E119 Type 2 diabetes mellitus without complications: Secondary | ICD-10-CM | POA: Insufficient documentation

## 2019-10-18 DIAGNOSIS — E785 Hyperlipidemia, unspecified: Secondary | ICD-10-CM | POA: Insufficient documentation

## 2019-10-18 DIAGNOSIS — I4891 Unspecified atrial fibrillation: Secondary | ICD-10-CM

## 2019-10-18 DIAGNOSIS — I10 Essential (primary) hypertension: Secondary | ICD-10-CM | POA: Diagnosis not present

## 2019-10-18 DIAGNOSIS — Z89511 Acquired absence of right leg below knee: Secondary | ICD-10-CM | POA: Insufficient documentation

## 2019-10-18 DIAGNOSIS — J449 Chronic obstructive pulmonary disease, unspecified: Secondary | ICD-10-CM | POA: Diagnosis not present

## 2019-10-18 DIAGNOSIS — I272 Pulmonary hypertension, unspecified: Secondary | ICD-10-CM | POA: Diagnosis not present

## 2019-10-18 DIAGNOSIS — Z79899 Other long term (current) drug therapy: Secondary | ICD-10-CM | POA: Diagnosis not present

## 2019-10-18 DIAGNOSIS — I4819 Other persistent atrial fibrillation: Secondary | ICD-10-CM | POA: Diagnosis present

## 2019-10-18 DIAGNOSIS — I739 Peripheral vascular disease, unspecified: Secondary | ICD-10-CM | POA: Insufficient documentation

## 2019-10-18 DIAGNOSIS — Z833 Family history of diabetes mellitus: Secondary | ICD-10-CM | POA: Insufficient documentation

## 2019-10-18 HISTORY — PX: CARDIOVERSION: SHX1299

## 2019-10-18 LAB — GLUCOSE, CAPILLARY: Glucose-Capillary: 115 mg/dL — ABNORMAL HIGH (ref 70–99)

## 2019-10-18 SURGERY — CARDIOVERSION
Anesthesia: General

## 2019-10-18 MED ORDER — LIDOCAINE 2% (20 MG/ML) 5 ML SYRINGE
INTRAMUSCULAR | Status: DC | PRN
  Administered 2019-10-18: 60 mg via INTRAVENOUS

## 2019-10-18 MED ORDER — SODIUM CHLORIDE 0.9 % IV SOLN
INTRAVENOUS | Status: AC | PRN
  Administered 2019-10-18: 500 mL via INTRAMUSCULAR

## 2019-10-18 MED ORDER — PROPOFOL 10 MG/ML IV BOLUS
INTRAVENOUS | Status: DC | PRN
  Administered 2019-10-18: 40 mg via INTRAVENOUS

## 2019-10-18 NOTE — Anesthesia Postprocedure Evaluation (Signed)
Anesthesia Post Note  Patient: Debbie [name]  Procedure(s) Performed: CARDIOVERSION (N/A )     Patient location during evaluation: Endoscopy Anesthesia Type: General Level of consciousness: awake and alert Pain management: pain level controlled Vital Signs Assessment: post-procedure vital signs reviewed and stable Respiratory status: spontaneous breathing, nonlabored ventilation and respiratory function stable Cardiovascular status: blood pressure returned to baseline and stable Postop Assessment: no apparent nausea or vomiting Anesthetic complications: no    Last Vitals:  Vitals:   10/18/19 1005 10/18/19 1020  BP: 131/64 (!) 160/79  Pulse: 71 79  Resp: 17 (!) 21  Temp:    SpO2: 98% 94%    Last Pain:  Vitals:   10/18/19 1020  TempSrc:   PainSc: 0-No pain   Pain Goal:                   Lidia Collum

## 2019-10-18 NOTE — Anesthesia Preprocedure Evaluation (Addendum)
Anesthesia Evaluation  Patient identified by MRN, date of birth, ID band Patient awake    Reviewed: Allergy & Precautions, NPO status , Patient's Chart, lab work & pertinent test results  History of Anesthesia Complications Negative for: history of anesthetic complications  Airway Mallampati: II  TM Distance: >3 FB Neck ROM: Full    Dental  (+) Teeth Intact   Pulmonary asthma , sleep apnea , COPD, former smoker,  Pulmonary hypertension   Pulmonary exam normal        Cardiovascular hypertension, + Peripheral Vascular Disease  + dysrhythmias Atrial Fibrillation  Rhythm:Irregular Rate:Normal     Neuro/Psych negative neurological ROS  negative psych ROS   GI/Hepatic Neg liver ROS, GERD  ,  Endo/Other  diabetes, Type 2  Renal/GU negative Renal ROS  negative genitourinary   Musculoskeletal negative musculoskeletal ROS (+)   Abdominal   Peds  Hematology negative hematology ROS (+)   Anesthesia Other Findings Echo 09/22/19: EF 50-55%, moderate RV systolic dysfunction, mild MR, RVSP 59  Reproductive/Obstetrics                            Anesthesia Physical Anesthesia Plan  ASA: III  Anesthesia Plan: General   Post-op Pain Management:    Induction: Intravenous  PONV Risk Score and Plan: 3 and TIVA and Treatment may vary due to age or medical condition  Airway Management Planned: Mask  Additional Equipment: None  Intra-op Plan:   Post-operative Plan:   Informed Consent: I have reviewed the patients History and Physical, chart, labs and discussed the procedure including the risks, benefits and alternatives for the proposed anesthesia with the patient or authorized representative who has indicated his/her understanding and acceptance.       Plan Discussed with:   Anesthesia Plan Comments:        Anesthesia Quick Evaluation

## 2019-10-18 NOTE — Interval H&P Note (Signed)
History and Physical Interval Note:  10/18/2019 8:48 AM  Debbie [name]  has presented today for surgery, with the diagnosis of A-FIB.  The various methods of treatment have been discussed with the patient and family. After consideration of risks, benefits and other options for treatment, the patient has consented to  Procedure(s): CARDIOVERSION (N/A) as a surgical intervention.  The patient's history has been reviewed, patient examined, no change in status, stable for surgery.  I have reviewed the patient's chart and labs.  Questions were answered to the patient's satisfaction.     Jenkins Rouge

## 2019-10-18 NOTE — Anesthesia Procedure Notes (Signed)
Procedure Name: MAC Date/Time: 10/18/2019 9:53 AM Performed by: Alain Marion, CRNA Pre-anesthesia Checklist: Patient identified, Emergency Drugs available, Suction available and Patient being monitored Patient Re-evaluated:Patient Re-evaluated prior to induction Oxygen Delivery Method: Simple face mask and Ambu bag Placement Confirmation: positive ETCO2

## 2019-10-18 NOTE — CV Procedure (Signed)
[place]: Anesthesia: Propofol On Rx Eliquis  [place] x 1 120 J converted to NSR rate 72 bpm No immediate neurologic sequelae  Jenkins Rouge MD [facility]

## 2019-10-18 NOTE — Transfer of Care (Signed)
Immediate Anesthesia Transfer of Care Note  Patient: Debbie [name]  Procedure(s) Performed: CARDIOVERSION (N/A )  Patient Location: Endoscopy Unit  Anesthesia Type:General  Level of Consciousness: awake, alert  and oriented  Airway & Oxygen Therapy: Patient Spontanous Breathing, nasal cannula  Post-op Assessment: Report given to RN and Post -op Vital signs reviewed and stable  Post vital signs: Reviewed and stable  Last Vitals:  Vitals Value Taken Time  BP 120/75 10/18/19 0958  Temp    Pulse 68 10/18/19 1000  Resp 16 10/18/19 1000  SpO2 97 % 10/18/19 1000    Last Pain:  Vitals:   10/18/19 0901  TempSrc: Oral  PainSc: 0-No pain         Complications: No apparent anesthesia complications

## 2019-10-18 NOTE — Discharge Instructions (Signed)
Electrical Cardioversion Electrical cardioversion is the delivery of a jolt of electricity to restore a normal rhythm to the heart. A rhythm that is too fast or is not regular keeps the heart from pumping well. In this procedure, sticky patches or metal paddles are placed on the chest to deliver electricity to the heart from a device. This procedure may be done in an emergency if:  There is low or no blood pressure as a result of the heart rhythm.  Normal rhythm must be restored as fast as possible to protect the brain and heart from further damage.  It may save a life. This may also be a scheduled procedure for irregular or fast heart rhythms that are not immediately life-threatening. Tell a health care provider about:  Any allergies you have.  All medicines you are taking, including vitamins, herbs, eye drops, creams, and over-the-counter medicines.  Any problems you or family members have had with anesthetic medicines.  Any blood disorders you have.  Any surgeries you have had.  Any medical conditions you have.  Whether you are pregnant or may be pregnant. What are the risks? Generally, this is a safe procedure. However, problems may occur, including:  Allergic reactions to medicines.  A blood clot that breaks free and travels to other parts of your body.  The possible return of an abnormal heart rhythm within hours or days after the procedure.  Your heart stopping (cardiac arrest). This is rare. What happens before the procedure? Medicines  Your health care provider may have you start taking: ? Blood-thinning medicines (anticoagulants) so your blood does not clot as easily. ? Medicines to help stabilize your heart rate and rhythm.  Ask your health care provider about: ? Changing or stopping your regular medicines. This is especially important if you are taking diabetes medicines or blood thinners. ? Taking medicines such as aspirin and ibuprofen. These medicines can  thin your blood. Do not take these medicines unless your health care provider tells you to take them. ? Taking over-the-counter medicines, vitamins, herbs, and supplements. General instructions  Follow instructions from your health care provider about eating or drinking restrictions.  Plan to have someone take you home from the hospital or clinic.  If you will be going home right after the procedure, plan to have someone with you for 24 hours.  Ask your health care provider what steps will be taken to help prevent infection. These may include washing your skin with a germ-killing soap. What happens during the procedure?   An IV will be inserted into one of your veins.  Sticky patches (electrodes) or metal paddles may be placed on your chest.  You will be given a medicine to help you relax (sedative).  An electrical shock will be delivered. The procedure may vary among health care providers and hospitals. What can I expect after the procedure?  Your blood pressure, heart rate, breathing rate, and blood oxygen level will be monitored until you leave the hospital or clinic.  Your heart rhythm will be watched to make sure it does not change.  You may have some redness on the skin where the shocks were given. Follow these instructions at home:  Do not drive for 24 hours if you were given a sedative during your procedure.  Take over-the-counter and prescription medicines only as told by your health care provider.  Ask your health care provider how to check your pulse. Check it often.  Rest for 48 hours after the procedure or   as told by your health care provider.  Avoid or limit your caffeine use as told by your health care provider.  Keep all follow-up visits as told by your health care provider. This is important. Contact a health care provider if:  You feel like your heart is beating too quickly or your pulse is not regular.  You have a serious muscle cramp that does not go  away. Get help right away if:  You have discomfort in your chest.  You are dizzy or you feel faint.  You have trouble breathing or you are short of breath.  Your speech is slurred.  You have trouble moving an arm or leg on one side of your body.  Your fingers or toes turn cold or blue. Summary  Electrical cardioversion is the delivery of a jolt of electricity to restore a normal rhythm to the heart.  This procedure may be done right away in an emergency or may be a scheduled procedure if the condition is not an emergency.  Generally, this is a safe procedure.  After the procedure, check your pulse often as told by your health care provider. This information is not intended to replace advice given to you by your health care provider. Make sure you discuss any questions you have with your health care provider. Document Revised: 04/05/2019 Document Reviewed: 04/05/2019 Elsevier Patient Education  2020 Elsevier Inc.  

## 2019-10-19 NOTE — Progress Notes (Signed)
Post-op phone call completed after cardioversion on 10/18/19.  Pt stated she had a visit from a [place] RN today, 10/19/19, and the nurse stated the pt was having an "irregular beat" or beats were "skipping."   The Home Health RN and I advised the pt to contact her cardiologist for additional instructions.  Pt verbalized understanding.

## 2019-10-20 ENCOUNTER — Encounter: Payer: Self-pay | Admitting: *Deleted

## 2019-10-26 ENCOUNTER — Ambulatory Visit: Payer: Medicare Other | Admitting: Physician Assistant

## 2019-10-31 NOTE — Progress Notes (Addendum)
Cardiology Office Note    Date:  11/01/2019   ID:  RHAVEN HEITZ, DOB 1946/04/20, MRN [id number]  PCP:  [name], MD  Cardiologist:  Dr. Burt Knack  Chief Complaint: cardioversion follow up  History of Present Illness:   Debbie [name] is a 74 y.o. female with hx of PAF, COPD on 3L oxygen, HTN, DM, HLD, pulmonary hypertension and s/pthromboangiitis obliteransR BKA in 1990 presents for follow up.  Admitted 09/2019 with sepsis/septic shock 2nd to UTI, AKi and metabolic encephalopathy. Noted unknown duration of atrial fibrillation. Difficult to know whether atrial fib was precipitated by urosepsis or whether she had this prior to getting sick. Started Eliquis. Plan cardioversion after 3 weeks of anticoagulation.  Echo showed LVEF of 50-55% and grade 2 DD.   Underwent successful cardioversion 10/18/19.  Here today for follow up.  She felt good for 2 days post cardioversion.  Home health noted irregular heart rate.  Denies chest pain, shortness of breath, orthopnea, PND, syncope or lower extremity edema.  Past Medical History:  Diagnosis Date  . Adenomatous polyp 12/04/2006  . Asthma   . COPD (chronic obstructive pulmonary disease) ([place])   . DM type 2 (diabetes mellitus, type 2) ([place])   . Hemorrhoid 12/04/2006  . Hyperlipidemia   . Hypertension   . Peripheral arterial disease ([place])   . Sepsis ([place]) 09/22/2019    Past Surgical History:  Procedure Laterality Date  . CARDIOVERSION N/A 10/18/2019   Procedure: CARDIOVERSION;  Surgeon: [name], MD;  Location: [facility] ENDOSCOPY;  Service: Cardiovascular;  Laterality: N/A;  . CHOLECYSTECTOMY    . COLONOSCOPY  12/03/2006   Dr. Delight Ovens, adenomatous polyp  . COLONOSCOPY  03/25/2012   Procedure: COLONOSCOPY;  Surgeon: Daneil Dolin, MD;  Location: [facility] ENDO SUITE;  Service: Endoscopy;  Laterality: N/A;  10:30  . ESOPHAGOGASTRODUODENOSCOPY  11/03/2002   Dr. Gala Romney- normal exam- was done to check for possible foreign body    . Fiberoptic bronchoscopy with endobronchial  ultrasound  10/22/2010   [place]  . Right BKA  1990  . RIGHT HEART CATHETERIZATION N/A 06/22/2014   Procedure: RIGHT HEART CATH;  Surgeon: Larey Dresser, MD;  Location: [facility] CATH LAB;  Service: Cardiovascular;  Laterality: N/A;    Current Medications: Prior to Admission medications   Medication Sig Start Date End Date Taking? Authorizing Provider  apixaban (ELIQUIS) 5 MG TABS tablet Take 1 tablet (5 mg total) by mouth 2 (two) times daily. 09/24/19   Florencia Reasons, MD  BD PEN NEEDLE NANO U/F 32G X 4 MM MISC 2 (two) times daily. as directed 02/26/15   [provider]  Calcium Carb-Cholecalciferol (CALCIUM 1000 + D PO) Take 1,000 mg by mouth daily.    [provider]  Coenzyme Q10 300 MG CAPS Take 300 mg by mouth daily.    [provider]  dexlansoprazole (DEXILANT) 60 MG capsule Take 1 capsule (60 mg total) by mouth daily. 09/29/15   Tanda Rockers, MD  diltiazem (CARDIZEM CD) 240 MG 24 hr capsule Take 1 capsule (240 mg total) by mouth daily. 09/25/19   Florencia Reasons, MD  ezetimibe (ZETIA) 10 MG tablet Take 10 mg by mouth every evening.  08/27/19   [provider]  fluticasone (FLONASE) 50 MCG/ACT nasal spray Place 1 spray into the nose daily as needed for allergies.     [provider]  furosemide (LASIX) 40 MG tablet Take 0.5 tablets (20 mg total) by mouth daily as needed for  fluid or edema. 09/24/19 10/24/19  Florencia Reasons, MD  gabapentin (NEURONTIN) 100 MG capsule Take 100-200 mg by mouth See admin instructions. Take 200 mg in the morning and 100 mg at night    [provider]  halobetasol (ULTRAVATE) 0.05 % cream Apply 1 application topically 2 (two) times daily as needed (psoriasis).  08/15/16   [provider]  ibandronate (BONIVA) 150 MG tablet Take 150 mg by mouth every 30 (thirty) days. 08/19/19 02/15/20  [provider]  ibuprofen (ADVIL,MOTRIN) 200 MG tablet Take 400 mg by mouth every 6 (six)  hours as needed for headache, mild pain or moderate pain.     [provider]  Insulin Glargine (BASAGLAR KWIKPEN) 100 UNIT/ML SOPN Inject 45 Units into the skin daily.     [provider]  Insulin Pen Needle (BD PEN NEEDLE NANO U/F) 32G X 4 MM MISC USE TWICE DAILY AS DIRECTED 05/31/15   [provider]  ketotifen (ZADITOR) 0.025 % ophthalmic solution Place 1 drop into both eyes daily as needed (dry eyes).    [provider]  losartan (COZAAR) 100 MG tablet Take 0.5 tablets (50 mg total) by mouth daily. 09/24/19   Florencia Reasons, MD  OXYGEN Place 3 L into the nose See admin instructions. 3 lpm with sleep and exertion  APS     [provider]  SitaGLIPtin-MetFORMIN HCl (JANUMET XR) 50-1000 MG TB24 Take 1 tablet by mouth 2 (two) times daily.  01/10/14   [provider]  VOLTAREN 1 % GEL Apply 2 g topically 4 (four) times daily as needed (pain).  08/13/17   [provider]    Allergies:   Meloxicam   Social History   Socioeconomic History  . Marital status: Married    Spouse name: Caylor Heckaman  . Number of children: 3  . Years of education: Not on file  . Highest education level: Not on file  Occupational History  . Occupation: retired    Fish farm manager: UNEMPLOYED  Tobacco Use  . Smoking status: Former Smoker    Packs/day: 0.50    Years: 18.00    Pack years: 9.00    Types: Cigarettes    Quit date: 09/16/1990    Years since quitting: 29.1  . Smokeless tobacco: Never Used  Substance and Sexual Activity  . Alcohol use: No    Alcohol/week: 0.0 standard drinks  . Drug use: No  . Sexual activity: Not on file  Other Topics Concern  . Not on file  Social History Narrative  . Not on file   Social Determinants of Health   Financial Resource Strain:   . Difficulty of Paying Living Expenses: Not on file  Food Insecurity:   . Worried About Charity fundraiser in the Last Year: Not on file  . Ran Out of Food in the Last Year: Not on file   Transportation Needs:   . Lack of Transportation (Medical): Not on file  . Lack of Transportation (Non-Medical): Not on file  Physical Activity:   . Days of Exercise per Week: Not on file  . Minutes of Exercise per Session: Not on file  Stress:   . Feeling of Stress : Not on file  Social Connections:   . Frequency of Communication with Friends and Family: Not on file  . Frequency of Social Gatherings with Friends and Family: Not on file  . Attends Religious Services: Not on file  . Active Member of Clubs or Organizations: Not on file  .  Attends Archivist Meetings: Not on file  . Marital Status: Not on file     Family History:  The patient's family history includes Breast cancer in her sister; COPD in her sister; CVA (age of onset: 21) in her mother; Diabetes in her sister; Diabetes (age of onset: 76) in her mother; Heart disease in her mother; Lung cancer in her father.   ROS:   Please see the history of present illness.    ROS All other systems reviewed and are negative.   PHYSICAL EXAM:   VS:  BP (!) 154/90   Pulse 99   Ht 5\' 8"  (1.727 m)   Wt 200 lb (90.7 kg)   BMI 30.41 kg/m    GEN: Well nourished, well developed, in no acute distress  HEENT: normal  Neck: no JVD, carotid bruits, or masses Cardiac: Irregularly irregular; no murmurs, rubs, or gallops,no edema  Respiratory:  clear to auscultation bilaterally, normal work of breathing GI: soft, nontender, nondistended, + BS MS: s/p R BKA  Skin: warm and dry, no rash Neuro:  Alert and Oriented x 3, Strength and sensation are intact Psych: euthymic mood, full affect  Wt Readings from Last 3 Encounters:  11/01/19 200 lb (90.7 kg)  10/18/19 196 lb 3.2 oz (89 kg)  10/06/19 202 lb 12.8 oz (92 kg)      Studies/Labs Reviewed:   EKG:  EKG is ordered today.  The ekg ordered today demonstrates atrial fibrillation  Recent Labs: 09/20/2019: TSH 1.157 09/21/2019: ALT 44; B Natriuretic Peptide 457.2 09/24/2019:  Magnesium 1.6 10/06/2019: BUN 12; Creatinine, Ser 0.73; Hemoglobin 11.7; Platelets 380; Potassium 4.7; Sodium 139   Lipid Panel    Component Value Date/Time   CHOL 147 08/17/2014 0840   TRIG 84.0 08/17/2014 0840   HDL 51.30 08/17/2014 0840   CHOLHDL 3 08/17/2014 0840   VLDL 16.8 08/17/2014 0840   LDLCALC 79 08/17/2014 0840    Additional studies/ records that were reviewed today include:   Echo 09/22/2019 1. Left ventricular ejection fraction, by visual estimation, is 50 to 55%. The left ventricle has normal function. There is no left ventricular hypertrophy. 2. Left ventricular diastolic parameters are indeterminate. 3. Global right ventricle has moderately reduced systolic function.The right ventricular size is mildly enlarged. 4. Left atrial size was normal. 5. Right atrial size was normal. 6. The mitral valve is normal in structure. Mild mitral valve regurgitation. 7. The tricuspid valve is normal in structure. 8. The aortic valve is tricuspid. Aortic valve regurgitation is not visualized. No evidence of aortic valve sclerosis or stenosis. 9. The pulmonic valve was not well visualized. Pulmonic valve regurgitation is not visualized. 10. The inferior vena cava is dilated in size with <50% respiratory variability, suggesting right atrial pressure of 15 mmHg. 11. The tricuspid regurgitant velocity is 3.34 m/s, and with an assumed right atrial pressure of 15 mmHg, the estimated right ventricular systolic pressure is moderately elevated at 59.6 mmHg.    ASSESSMENT & PLAN:   1. Paroxysmal  atrial fibrillation  -Rate controlled. She felt good for 2 days post cardioversion. Needs antiarrhytmic therapy. Amiodarone is not a good choice given oxygen dependent COPD and pHTN.  Patient will be seen by DOD Dr. [name] for consultation for atrial fibrillation.   2.  Hypertension -Elevated. On Losartan and Cardizem.   3. COPD on 3 L oxygen   4. Pulmonary hypertension   See Dr.  [name] later today.    Medication Adjustments/Labs and Tests Ordered: Current medicines  are reviewed at length with the patient today.  Concerns regarding medicines are outlined above.  Medication changes, Labs and Tests ordered today are listed in the Patient Instructions below. There are no Patient Instructions on file for this visit.   Jarrett Soho, [place]  11/01/2019 2:13 PM    [facility] [place], [place], [place]  [address] Phone: [phone]; Fax: [phone]   EP Attending  Patient seen and examined. See my note for additional details.   Mikle Bosworth.D.

## 2019-11-01 ENCOUNTER — Telehealth: Payer: Self-pay | Admitting: Pharmacist

## 2019-11-01 ENCOUNTER — Other Ambulatory Visit: Payer: Self-pay

## 2019-11-01 ENCOUNTER — Ambulatory Visit (INDEPENDENT_AMBULATORY_CARE_PROVIDER_SITE_OTHER): Payer: Medicare Other | Admitting: Internal Medicine

## 2019-11-01 ENCOUNTER — Encounter: Payer: Self-pay | Admitting: Physician Assistant

## 2019-11-01 ENCOUNTER — Encounter: Payer: Self-pay | Admitting: Internal Medicine

## 2019-11-01 ENCOUNTER — Ambulatory Visit (INDEPENDENT_AMBULATORY_CARE_PROVIDER_SITE_OTHER): Payer: Medicare Other | Admitting: Physician Assistant

## 2019-11-01 VITALS — BP 154/90 | HR 99 | Ht 68.0 in | Wt 200.0 lb

## 2019-11-01 DIAGNOSIS — I272 Pulmonary hypertension, unspecified: Secondary | ICD-10-CM | POA: Diagnosis not present

## 2019-11-01 DIAGNOSIS — I4819 Other persistent atrial fibrillation: Secondary | ICD-10-CM

## 2019-11-01 DIAGNOSIS — I4891 Unspecified atrial fibrillation: Secondary | ICD-10-CM | POA: Diagnosis not present

## 2019-11-01 DIAGNOSIS — I10 Essential (primary) hypertension: Secondary | ICD-10-CM

## 2019-11-01 NOTE — Progress Notes (Signed)
HPI Debbie [name] is referred today by Pricilla Loveless, PA-C for evaluation of persistent atrial fib. She is a pleasant 74 yo woman with obesity, perisistent atrial fib, and COPD on 3 liters of oxygen. She developed sepsis and atrial fib and was cardioverted but then had ERAF. She has preserved LV function. She has been on eliquis. She feels tired, fatigued and sob in atrial fib. She has not had syncope. Minimal edema.  Allergies  Allergen Reactions  . Meloxicam Other (See Comments)    Causes excess Fluid buildup     Current Outpatient Medications  Medication Sig Dispense Refill  . apixaban (ELIQUIS) 5 MG TABS tablet Take 1 tablet (5 mg total) by mouth 2 (two) times daily. 60 tablet 0  . BD PEN NEEDLE NANO U/F 32G X 4 MM MISC 2 (two) times daily. as directed  6  . Calcium Carb-Cholecalciferol (CALCIUM 1000 + D PO) Take 1,000 mg by mouth daily.    . Coenzyme Q10 300 MG CAPS Take 300 mg by mouth daily.    Marland Kitchen dexlansoprazole (DEXILANT) 60 MG capsule Take 1 capsule (60 mg total) by mouth daily.    Marland Kitchen diltiazem (CARDIZEM CD) 240 MG 24 hr capsule Take 1 capsule (240 mg total) by mouth daily. 30 capsule 0  . ezetimibe (ZETIA) 10 MG tablet Take 10 mg by mouth every evening.     . fluticasone (FLONASE) 50 MCG/ACT nasal spray Place 1 spray into the nose daily as needed for allergies.     Marland Kitchen gabapentin (NEURONTIN) 100 MG capsule Take 100-200 mg by mouth See admin instructions. Take 200 mg in the morning and 100 mg at night    . halobetasol (ULTRAVATE) 0.05 % cream Apply 1 application topically 2 (two) times daily as needed (psoriasis).     . ibandronate (BONIVA) 150 MG tablet Take 150 mg by mouth every 30 (thirty) days.    Marland Kitchen ibuprofen (ADVIL,MOTRIN) 200 MG tablet Take 400 mg by mouth every 6 (six) hours as needed for headache, mild pain or moderate pain.     . Insulin Glargine (BASAGLAR KWIKPEN) 100 UNIT/ML SOPN Inject 45 Units into the skin daily.     . Insulin Pen Needle (BD PEN NEEDLE NANO  U/F) 32G X 4 MM MISC USE TWICE DAILY AS DIRECTED    . ketotifen (ZADITOR) 0.025 % ophthalmic solution Place 1 drop into both eyes daily as needed (dry eyes).    Marland Kitchen losartan (COZAAR) 100 MG tablet Take 0.5 tablets (50 mg total) by mouth daily. 30 tablet 0  . OXYGEN Place 3 L into the nose See admin instructions. 3 lpm with sleep and exertion  APS     . SitaGLIPtin-MetFORMIN HCl (JANUMET XR) 50-1000 MG TB24 Take 1 tablet by mouth 2 (two) times daily.     . VOLTAREN 1 % GEL Apply 2 g topically 4 (four) times daily as needed (pain).   2  . furosemide (LASIX) 40 MG tablet Take 0.5 tablets (20 mg total) by mouth daily as needed for fluid or edema. (Patient not taking: Reported on 11/01/2019) 30 tablet 0   No current facility-administered medications for this visit.     Past Medical History:  Diagnosis Date  . Adenomatous polyp 12/04/2006  . Asthma   . COPD (chronic obstructive pulmonary disease) ([place])   . DM type 2 (diabetes mellitus, type 2) ([place])   . Hemorrhoid 12/04/2006  . Hyperlipidemia   . Hypertension   . Peripheral arterial disease ([place])   .  Sepsis ([place]) 09/22/2019    ROS:   All systems reviewed and negative except as noted in the HPI.   Past Surgical History:  Procedure Laterality Date  . CARDIOVERSION N/A 10/18/2019   Procedure: CARDIOVERSION;  Surgeon: [name], MD;  Location: [facility] ENDOSCOPY;  Service: Cardiovascular;  Laterality: N/A;  . CHOLECYSTECTOMY    . COLONOSCOPY  12/03/2006   Dr. Delight Ovens, adenomatous polyp  . COLONOSCOPY  03/25/2012   Procedure: COLONOSCOPY;  Surgeon: Daneil Dolin, MD;  [facility] ENDO [facility];  Service: Endoscopy;  Laterality: N/A;  10:30  . ESOPHAGOGASTRODUODENOSCOPY  11/03/2002   Dr. Gala Romney- normal exam- was done to check for possible foreign body  . Fiberoptic bronchoscopy with endobronchial  ultrasound  10/22/2010   [place]  . Right BKA  1990  . RIGHT HEART CATHETERIZATION N/A 06/22/2014   Procedure: RIGHT HEART CATH;  Surgeon:  Larey Dresser, MD;  Location: [facility] CATH LAB;  Service: Cardiovascular;  Laterality: N/A;     Family History  Problem Relation Age of Onset  . COPD Sister   . Diabetes Sister   . Breast cancer Sister        Mastectomy  . CVA Mother 39  . Heart disease Mother   . Diabetes Mother 76  . Lung cancer Father        lung carcinoma  . Colon cancer Neg Hx      Social History   Socioeconomic History  . Marital status: Married    Spouse name: Keysa Troop  . Number of children: 3  . Years of education: Not on file  . Highest education level: Not on file  Occupational History  . Occupation: retired    Fish farm manager: UNEMPLOYED  Tobacco Use  . Smoking status: Former Smoker    Packs/day: 0.50    Years: 18.00    Pack years: 9.00    Types: Cigarettes    Quit date: 09/16/1990    Years since quitting: 29.1  . Smokeless tobacco: Never Used  Substance and Sexual Activity  . Alcohol use: No    Alcohol/week: 0.0 standard drinks  . Drug use: No  . Sexual activity: Not on file  Other Topics Concern  . Not on file  Social History Narrative  . Not on file   Social Determinants of Health   Financial Resource Strain:   . Difficulty of Paying Living Expenses: Not on file  Food Insecurity:   . Worried About Charity fundraiser in the Last Year: Not on file  . Ran Out of Food in the Last Year: Not on file  Transportation Needs:   . Lack of Transportation (Medical): Not on file  . Lack of Transportation (Non-Medical): Not on file  Physical Activity:   . Days of Exercise per Week: Not on file  . Minutes of Exercise per Session: Not on file  Stress:   . Feeling of Stress : Not on file  Social Connections:   . Frequency of Communication with Friends and Family: Not on file  . Frequency of Social Gatherings with Friends and Family: Not on file  . Attends Religious Services: Not on file  . Active Member of Clubs or Organizations: Not on file  . Attends Archivist Meetings: Not on  file  . Marital Status: Not on file  Intimate Partner Violence:   . Fear of Current or Ex-Partner: Not on file  . Emotionally Abused: Not on file  . Physically Abused: Not on file  .  Sexually Abused: Not on file     BP (!) 154/90   Pulse 99   Ht 5\' 8"  (1.727 m)   Wt 200 lb (90.7 kg)   SpO2 99%   BMI 30.41 kg/m   Physical Exam:  Well appearing NAD HEENT: Unremarkable Neck:  No JVD, no thyromegally Lymphatics:  No adenopathy Back:  No CVA tenderness Lungs:  Clear with no wheezes HEART:  Regular rate rhythm, no murmurs, no rubs, no clicks Abd:  soft, positive bowel sounds, no organomegally, no rebound, no guarding Ext:  2 plus pulses, no edema, no cyanosis, no clubbing Skin:  No rashes no nodules Neuro:  CN II through XII intact, motor grossly intact  EKG - atrial fib with a RVR   Assess/Plan: 1. Persistent symptomatic atrial fib - I have discussed the treatment options with the patient and recommended initiation of dofetilide. I have discussed the indications/risks/benefits/goals/expectations of the procedure with the patient. She wishes to proceed. 2. HTN - she will continue on losartan and cardizem.  Mikle Bosworth.D.

## 2019-11-01 NOTE — Addendum Note (Signed)
Addended by: Dominga Ferry on: 11/01/2019 03:07 PM   Modules accepted: Level of Service

## 2019-11-01 NOTE — Patient Instructions (Addendum)
Medication Instructions:  Your physician recommends that you continue on your current medications as directed. Please refer to the Current Medication list given to you today.  Labwork: None ordered.  Testing/Procedures: None ordered.  Follow-Up: Your physician wants you to follow-up after dofetilide admission.  Any Other Special Instructions Will Be Listed Below (If Applicable).  If you need a refill on your cardiac medications before your next appointment, please call your pharmacy.   Dofetilide capsules What is this medicine? DOFETILIDE (doe FET il ide) is an antiarrhythmic drug. It helps make your heart beat regularly. This medicine also helps to slow rapid heartbeats. This medicine may be used for other purposes; ask your health care provider or pharmacist if you have questions. COMMON BRAND NAME(S): Tikosyn What should I tell my health care provider before I take this medicine? They need to know if you have any of these conditions:  heart disease  history of irregular heartbeat  history of low levels of potassium or magnesium in the blood  kidney disease  liver disease  an unusual or allergic reaction to dofetilide, other medicines, foods, dyes, or preservatives  pregnant or trying to get pregnant  breast-feeding How should I use this medicine? Take this medicine by mouth with a glass of water. Follow the directions on the prescription label. Do not take with grapefruit juice. You can take it with or without food. If it upsets your stomach, take it with food. Take your medicine at regular intervals. Do not take it more often than directed. Do not stop taking except on your doctor's advice. A special MedGuide will be given to you by the pharmacist with each prescription and refill. Be sure to read this information carefully each time. Talk to your pediatrician regarding the use of this medicine in children. Special care may be needed. Overdosage: If you think you have  taken too much of this medicine contact a poison control center or emergency room at once. NOTE: This medicine is only for you. Do not share this medicine with others. What if I miss a dose? If you miss a dose, skip it. Take your next dose at the normal time. Do not take extra or 2 doses at the same time to make up for the missed dose. What may interact with this medicine? Do not take this medicine with any of the following medications:  cimetidine  cisapride  dolutegravir  dronedarone  hydrochlorothiazide  ketoconazole  megestrol  pimozide  prochlorperazine  thioridazine  trimethoprim  verapamil This medicine may also interact with the following medications:  amiloride  cannabinoids  certain antibiotics like erythromycin or clarithromycin  certain antiviral medicines for HIV or hepatitis  certain medicines for depression, anxiety, or psychotic disorders  digoxin  diltiazem  grapefruit juice  metformin  nefazodone  other medicines that prolong the QT interval (an abnormal heart rhythm)  quinine  triamterene  zafirlukast  ziprasidone This list may not describe all possible interactions. Give your health care provider a list of all the medicines, herbs, non-prescription drugs, or dietary supplements you use. Also tell them if you smoke, drink alcohol, or use illegal drugs. Some items may interact with your medicine. What should I watch for while using this medicine? Your condition will be monitored carefully while you are receiving this medicine. What side effects may I notice from receiving this medicine? Side effects that you should report to your doctor or health care professional as soon as possible:  allergic reactions like skin rash, itching or  hives, swelling of the face, lips, or tongue  breathing problems  chest pain or chest tightness  dizziness  signs and symptoms of a dangerous change in heartbeat or heart rhythm like chest pain;  dizziness; fast or irregular heartbeat; palpitations; feeling faint or lightheaded, falls; breathing problems  signs and symptoms of electrolyte imbalance like severe diarrhea, unusual sweating, vomiting, loss of appetite, increased thirst  swelling of the ankles, legs, or feet  tingling, numbness in the hands or feet Side effects that usually do not require medical attention (report to your doctor or health care professional if they continue or are bothersome):  diarrhea  general ill feeling or flu-like symptoms  headache  nausea  trouble sleeping  stomach pain This list may not describe all possible side effects. Call your doctor for medical advice about side effects. You may report side effects to FDA at 1-800-FDA-1088. Where should I keep my medicine? Keep out of the reach of children. Store at room temperature between 15 and 30 degrees C (59 and 86 degrees F). Throw away any unused medicine after the expiration date. NOTE: This sheet is a summary. It may not cover all possible information. If you have questions about this medicine, talk to your doctor, pharmacist, or health care provider.  2020 Elsevier/Gold Standard (2018-08-24 10:18:48)

## 2019-11-01 NOTE — Telephone Encounter (Signed)
Medication list reviewed in anticipation of upcoming Tikosyn initiation. Patient is not taking any contraindicated medications. She does take metformin which has the possibility of increasing concentrations of Tikosyn - this is not contraindicated, will require usual close monitoring inpatient during Tikosyn initiation.  Patient is anticoagulated on Eliquis 5mg  BID on the appropriate dose. Please ensure that patient has not missed any anticoagulation doses in the 3 weeks prior to Tikosyn initiation. She was cardioverted on 10/18/19.   K > 4, Mg a bit low at 1.6 when last checked 09/24/19, this will need to be rechecked and likely repleted prior to Tikosyn initiation.  Patient will need to be counseled to avoid use of Benadryl while on Tikosyn and in the 2-3 days prior to Tikosyn initiation.

## 2019-11-02 ENCOUNTER — Encounter (HOSPITAL_COMMUNITY): Payer: Self-pay

## 2019-11-05 ENCOUNTER — Other Ambulatory Visit (HOSPITAL_COMMUNITY): Payer: Self-pay | Admitting: *Deleted

## 2019-11-11 ENCOUNTER — Other Ambulatory Visit (HOSPITAL_COMMUNITY)
Admission: RE | Admit: 2019-11-11 | Discharge: 2019-11-11 | Disposition: A | Discharge status: Home / Self Care | Payer: Medicare Other | Source: Ambulatory Visit | Attending: Internal Medicine | Admitting: Internal Medicine

## 2019-11-11 DIAGNOSIS — Z20822 Contact with and (suspected) exposure to covid-19: Secondary | ICD-10-CM | POA: Diagnosis not present

## 2019-11-11 DIAGNOSIS — Z01812 Encounter for preprocedural laboratory examination: Secondary | ICD-10-CM | POA: Diagnosis present

## 2019-11-11 LAB — SARS CORONAVIRUS 2 (TAT 6-24 HRS): SARS Coronavirus 2: NEGATIVE

## 2019-11-12 ENCOUNTER — Institutional Professional Consult (permissible substitution): Payer: Medicare Other | Admitting: Internal Medicine

## 2019-11-15 ENCOUNTER — Encounter (HOSPITAL_COMMUNITY): Payer: Self-pay | Admitting: Internal Medicine

## 2019-11-15 ENCOUNTER — Ambulatory Visit (HOSPITAL_COMMUNITY)
Admission: RE | Admit: 2019-11-15 | Discharge: 2019-11-15 | Disposition: A | Discharge status: Home / Self Care | Payer: Medicare Other | Source: Ambulatory Visit | Attending: Nurse Practitioner | Admitting: Nurse Practitioner

## 2019-11-15 ENCOUNTER — Encounter (HOSPITAL_COMMUNITY): Payer: Self-pay | Admitting: Nurse Practitioner

## 2019-11-15 ENCOUNTER — Other Ambulatory Visit: Payer: Self-pay

## 2019-11-15 ENCOUNTER — Inpatient Hospital Stay (HOSPITAL_COMMUNITY)
Admission: EM | Admit: 2019-11-15 | Discharge: 2019-11-18 | DRG: 310 | Disposition: A | Discharge status: Home / Self Care | Payer: Medicare Other | Source: Ambulatory Visit | Attending: Internal Medicine | Admitting: Internal Medicine

## 2019-11-15 VITALS — BP 160/90 | HR 98 | Ht 68.0 in | Wt 198.6 lb

## 2019-11-15 DIAGNOSIS — E785 Hyperlipidemia, unspecified: Secondary | ICD-10-CM | POA: Diagnosis present

## 2019-11-15 DIAGNOSIS — Z87891 Personal history of nicotine dependence: Secondary | ICD-10-CM

## 2019-11-15 DIAGNOSIS — Z9981 Dependence on supplemental oxygen: Secondary | ICD-10-CM

## 2019-11-15 DIAGNOSIS — I4819 Other persistent atrial fibrillation: Secondary | ICD-10-CM

## 2019-11-15 DIAGNOSIS — Z8249 Family history of ischemic heart disease and other diseases of the circulatory system: Secondary | ICD-10-CM | POA: Diagnosis not present

## 2019-11-15 DIAGNOSIS — Z801 Family history of malignant neoplasm of trachea, bronchus and lung: Secondary | ICD-10-CM | POA: Diagnosis not present

## 2019-11-15 DIAGNOSIS — Z794 Long term (current) use of insulin: Secondary | ICD-10-CM

## 2019-11-15 DIAGNOSIS — Z833 Family history of diabetes mellitus: Secondary | ICD-10-CM

## 2019-11-15 DIAGNOSIS — Z7983 Long term (current) use of bisphosphonates: Secondary | ICD-10-CM

## 2019-11-15 DIAGNOSIS — E1151 Type 2 diabetes mellitus with diabetic peripheral angiopathy without gangrene: Secondary | ICD-10-CM | POA: Diagnosis present

## 2019-11-15 DIAGNOSIS — Z7901 Long term (current) use of anticoagulants: Secondary | ICD-10-CM

## 2019-11-15 DIAGNOSIS — Z803 Family history of malignant neoplasm of breast: Secondary | ICD-10-CM

## 2019-11-15 DIAGNOSIS — Z89511 Acquired absence of right leg below knee: Secondary | ICD-10-CM

## 2019-11-15 DIAGNOSIS — I10 Essential (primary) hypertension: Secondary | ICD-10-CM | POA: Diagnosis present

## 2019-11-15 DIAGNOSIS — J449 Chronic obstructive pulmonary disease, unspecified: Secondary | ICD-10-CM | POA: Diagnosis present

## 2019-11-15 DIAGNOSIS — Z79899 Other long term (current) drug therapy: Secondary | ICD-10-CM

## 2019-11-15 DIAGNOSIS — Z823 Family history of stroke: Secondary | ICD-10-CM | POA: Diagnosis not present

## 2019-11-15 DIAGNOSIS — I4891 Unspecified atrial fibrillation: Secondary | ICD-10-CM

## 2019-11-15 DIAGNOSIS — D6869 Other thrombophilia: Secondary | ICD-10-CM

## 2019-11-15 DIAGNOSIS — E11649 Type 2 diabetes mellitus with hypoglycemia without coma: Secondary | ICD-10-CM | POA: Diagnosis present

## 2019-11-15 DIAGNOSIS — Z825 Family history of asthma and other chronic lower respiratory diseases: Secondary | ICD-10-CM

## 2019-11-15 HISTORY — DX: Other persistent atrial fibrillation: I48.19

## 2019-11-15 HISTORY — DX: Unspecified atrial fibrillation: I48.91

## 2019-11-15 LAB — BASIC METABOLIC PANEL
Anion gap: 11 (ref 5–15)
BUN: 10 mg/dL (ref 8–23)
CO2: 26 mmol/L (ref 22–32)
Calcium: 10 mg/dL (ref 8.9–10.3)
Chloride: 102 mmol/L (ref 98–111)
Creatinine, Ser: 0.73 mg/dL (ref 0.44–1.00)
GFR calc Af Amer: >60 mL/min (ref >60)
GFR calc non Af Amer: >60 mL/min (ref >60)
Glucose, Bld: 128 mg/dL — ABNORMAL HIGH (ref 70–99)
Potassium: 4.5 mmol/L (ref 3.5–5.1)
Sodium: 139 mmol/L (ref 135–145)

## 2019-11-15 LAB — MAGNESIUM
Magnesium: 1.7 mg/dL (ref 1.7–2.4)
Magnesium: 2.7 mg/dL — ABNORMAL HIGH (ref 1.7–2.4)

## 2019-11-15 LAB — GLUCOSE, CAPILLARY
Glucose-Capillary: 122 mg/dL — ABNORMAL HIGH (ref 70–99)
Glucose-Capillary: 157 mg/dL — ABNORMAL HIGH (ref 70–99)

## 2019-11-15 MED ORDER — CALCIUM CARBONATE-VITAMIN D 500-200 MG-UNIT PO TABS
1.0000 | ORAL_TABLET | Freq: Every day | ORAL | Status: DC
  Administered 2019-11-16 – 2019-11-18 (×3): 1 via ORAL
  Filled 2019-11-15 (×4): qty 1

## 2019-11-15 MED ORDER — SODIUM CHLORIDE 0.9% FLUSH
3.0000 mL | Freq: Two times a day (BID) | INTRAVENOUS | Status: DC
  Administered 2019-11-15 – 2019-11-18 (×5): 3 mL via INTRAVENOUS

## 2019-11-15 MED ORDER — COENZYME Q10 300 MG PO CAPS: 300.0000 mg | ORAL_CAPSULE | Freq: Every day | ORAL | Status: DC

## 2019-11-15 MED ORDER — APIXABAN 5 MG PO TABS
5.0000 mg | ORAL_TABLET | Freq: Two times a day (BID) | ORAL | Status: DC
  Administered 2019-11-15 – 2019-11-18 (×6): 5 mg via ORAL
  Filled 2019-11-15 (×6): qty 1

## 2019-11-15 MED ORDER — DOFETILIDE 500 MCG PO CAPS
500.0000 ug | ORAL_CAPSULE | Freq: Two times a day (BID) | ORAL | Status: DC
  Administered 2019-11-15 – 2019-11-16 (×3): 500 ug via ORAL
  Filled 2019-11-15 (×4): qty 1

## 2019-11-15 MED ORDER — KETOTIFEN FUMARATE 0.025 % OP SOLN: 1.0000 [drp] | Freq: Every day | OPHTHALMIC | Status: DC | PRN

## 2019-11-15 MED ORDER — MAGNESIUM OXIDE 400 (241.3 MG) MG PO TABS
400.0000 mg | ORAL_TABLET | Freq: Every morning | ORAL | Status: DC
  Administered 2019-11-16 – 2019-11-17 (×2): 400 mg via ORAL
  Filled 2019-11-15 (×2): qty 1

## 2019-11-15 MED ORDER — SODIUM CHLORIDE 0.9% FLUSH: 3.0000 mL | INTRAVENOUS | Status: DC | PRN

## 2019-11-15 MED ORDER — LINAGLIPTIN 5 MG PO TABS
5.0000 mg | ORAL_TABLET | Freq: Every day | ORAL | Status: DC
  Administered 2019-11-16 – 2019-11-18 (×3): 5 mg via ORAL
  Filled 2019-11-15 (×3): qty 1

## 2019-11-15 MED ORDER — INSULIN PEN NEEDLE 32G X 4 MM MISC: Freq: Two times a day (BID) | Status: DC

## 2019-11-15 MED ORDER — GABAPENTIN 100 MG PO CAPS
100.0000 mg | ORAL_CAPSULE | Freq: Every day | ORAL | Status: DC
  Administered 2019-11-15 – 2019-11-17 (×3): 100 mg via ORAL
  Filled 2019-11-15 (×3): qty 1

## 2019-11-15 MED ORDER — SODIUM CHLORIDE 0.9 % IV SOLN: 250.0000 mL | INTRAVENOUS | Status: DC | PRN

## 2019-11-15 MED ORDER — SITAGLIP PHOS-METFORMIN HCL ER 50-1000 MG PO TB24: 1.0000 | ORAL_TABLET | Freq: Two times a day (BID) | ORAL | Status: DC

## 2019-11-15 MED ORDER — PANTOPRAZOLE SODIUM 40 MG PO TBEC: 40.0000 mg | DELAYED_RELEASE_TABLET | Freq: Every day | ORAL | Status: DC

## 2019-11-15 MED ORDER — LOSARTAN POTASSIUM 50 MG PO TABS
50.0000 mg | ORAL_TABLET | Freq: Every day | ORAL | Status: DC
  Administered 2019-11-16 – 2019-11-18 (×3): 50 mg via ORAL
  Filled 2019-11-15 (×3): qty 1

## 2019-11-15 MED ORDER — BASAGLAR KWIKPEN 100 UNIT/ML ~~LOC~~ SOPN: 30.0000 [IU] | PEN_INJECTOR | Freq: Every day | SUBCUTANEOUS | Status: DC

## 2019-11-15 MED ORDER — EZETIMIBE 10 MG PO TABS
10.0000 mg | ORAL_TABLET | Freq: Every evening | ORAL | Status: DC
  Administered 2019-11-15 – 2019-11-17 (×3): 10 mg via ORAL
  Filled 2019-11-15 (×3): qty 1

## 2019-11-15 MED ORDER — DILTIAZEM HCL ER COATED BEADS 240 MG PO CP24
240.0000 mg | ORAL_CAPSULE | Freq: Every day | ORAL | Status: DC
  Administered 2019-11-16 – 2019-11-18 (×3): 240 mg via ORAL
  Filled 2019-11-15 (×3): qty 1

## 2019-11-15 MED ORDER — MAGNESIUM SULFATE 4 GM/100ML IV SOLN
4.0000 g | INTRAVENOUS | Status: AC
  Administered 2019-11-15: 4 g via INTRAVENOUS
  Filled 2019-11-15: qty 100

## 2019-11-15 MED ORDER — INSULIN GLARGINE 100 UNIT/ML ~~LOC~~ SOLN
30.0000 [IU] | Freq: Every day | SUBCUTANEOUS | Status: DC
  Administered 2019-11-16 – 2019-11-18 (×3): 30 [IU] via SUBCUTANEOUS
  Filled 2019-11-15 (×3): qty 0.3

## 2019-11-15 MED ORDER — DICLOFENAC SODIUM 1 % EX GEL
2.0000 g | Freq: Four times a day (QID) | CUTANEOUS | Status: DC | PRN
  Filled 2019-11-15: qty 100

## 2019-11-15 MED ORDER — TIZANIDINE HCL 4 MG PO TABS
4.0000 mg | ORAL_TABLET | Freq: Every day | ORAL | Status: DC
  Administered 2019-11-15 – 2019-11-17 (×3): 4 mg via ORAL
  Filled 2019-11-15 (×5): qty 1

## 2019-11-15 MED ORDER — METFORMIN HCL ER 500 MG PO TB24
500.0000 mg | ORAL_TABLET | Freq: Two times a day (BID) | ORAL | Status: DC
  Administered 2019-11-15 – 2019-11-18 (×6): 500 mg via ORAL
  Filled 2019-11-15 (×7): qty 1

## 2019-11-15 NOTE — Progress Notes (Signed)
Primary Care Physician: [name], MD Referring Physician: [name] is a 74 y.o. female with a h/o persistent afib that is in the afib clinic today for tikosyn admit per Dr. [name]. She states no missed doses of eliquis for at least 3 weeks. No benadryl use. She has been taking magnesium for low magnesium in the last couple of weeks. Drugs have been screened by PharmD and no qt prolonging drugs on board.Wears O2 at 3l/min.  Today, she denies symptoms of palpitations, chest pain, shortness of breath, orthopnea, PND, lower extremity edema, dizziness, presyncope, syncope, or neurologic sequela. The patient is tolerating medications without difficulties and is otherwise without complaint today.   Past Medical History:  Diagnosis Date  . Adenomatous polyp 12/04/2006  . Asthma   . COPD (chronic obstructive pulmonary disease) ([place])   . DM type 2 (diabetes mellitus, type 2) ([place])   . Hemorrhoid 12/04/2006  . Hyperlipidemia   . Hypertension   . Peripheral arterial disease ([place])   . Sepsis ([place]) 09/22/2019   Past Surgical History:  Procedure Laterality Date  . CARDIOVERSION N/A 10/18/2019   Procedure: CARDIOVERSION;  Surgeon: [name], MD;  Location: [facility] ENDOSCOPY;  Service: Cardiovascular;  Laterality: N/A;  . CHOLECYSTECTOMY    . COLONOSCOPY  12/03/2006   Dr. Delight Ovens, adenomatous polyp  . COLONOSCOPY  03/25/2012   Procedure: COLONOSCOPY;  Surgeon: Daneil Dolin, MD;  Location: [facility] ENDO SU[facility];  Service: Endoscopy;  Laterality: N/A;  10:30  . ESOPHAGOGASTRODUODENOSCOPY  11/03/2002   Dr. Gala Romney- normal exam- was done to check for possible foreign body  . Fiberoptic bronchoscopy with endobronchial  ultrasound  10/22/2010   [place]  . Right BKA  1990  . RIGHT HEART CATHETERIZATION N/A 06/22/2014   Procedure: RIGHT HEART CATH;  Surgeon: Larey Dresser, MD;  Location: [facility] CATH LAB;  Service: Cardiovascular;  Laterality: N/A;    Current Outpatient  Medications  Medication Sig Dispense Refill  . apixaban (ELIQUIS) 5 MG TABS tablet Take 1 tablet (5 mg total) by mouth 2 (two) times daily. 60 tablet 0  . BD PEN NEEDLE NANO U/F 32G X 4 MM MISC 2 (two) times daily. as directed  6  . Calcium Carb-Cholecalciferol (CALCIUM 1000 + D PO) Take 1,000 mg by mouth daily.    . Coenzyme Q10 300 MG CAPS Take 300 mg by mouth daily.    Marland Kitchen dexlansoprazole (DEXILANT) 60 MG capsule Take 1 capsule (60 mg total) by mouth daily.    Marland Kitchen diltiazem (CARDIZEM CD) 240 MG 24 hr capsule Take 1 capsule (240 mg total) by mouth daily. 30 capsule 0  . ezetimibe (ZETIA) 10 MG tablet Take 10 mg by mouth every evening.     . fluticasone (FLONASE) 50 MCG/ACT nasal spray Place 1 spray into the nose daily as needed for allergies.     . furosemide (LASIX) 40 MG tablet Take 0.5 tablets (20 mg total) by mouth daily as needed for fluid or edema. 30 tablet 0  . gabapentin (NEURONTIN) 100 MG capsule Take 100-200 mg by mouth See admin instructions. Take 200 mg in the morning and 100 mg at night    . halobetasol (ULTRAVATE) 0.05 % cream Apply 1 application topically 2 (two) times daily as needed (psoriasis).     . ibandronate (BONIVA) 150 MG tablet Take 150 mg by mouth every 30 (thirty) days.    Marland Kitchen ibuprofen (ADVIL,MOTRIN) 200 MG tablet Take 400 mg by mouth every  6 (six) hours as needed for headache, mild pain or moderate pain.     . Insulin Glargine (BASAGLAR KWIKPEN) 100 UNIT/ML SOPN Inject 45 Units into the skin daily.     . Insulin Pen Needle (BD PEN NEEDLE NANO U/F) 32G X 4 MM MISC USE TWICE DAILY AS DIRECTED    . ketotifen (ZADITOR) 0.025 % ophthalmic solution Place 1 drop into both eyes daily as needed (dry eyes).    Marland Kitchen losartan (COZAAR) 100 MG tablet Take 0.5 tablets (50 mg total) by mouth daily. 30 tablet 0  . Magnesium 250 MG TABS Take by mouth.    . OXYGEN Place 3 L into the nose See admin instructions. 3 lpm with sleep and exertion  APS     . SitaGLIPtin-MetFORMIN HCl (JANUMET XR)  50-1000 MG TB24 Take 1 tablet by mouth 2 (two) times daily.     Marland Kitchen tiZANidine (ZANAFLEX) 4 MG tablet Take 4 mg by mouth at bedtime.    . VOLTAREN 1 % GEL Apply 2 g topically 4 (four) times daily as needed (pain).   2   No current facility-administered medications for this encounter.    Allergies  Allergen Reactions  . Meloxicam Other (See Comments)    Causes excess Fluid buildup    Social History   Socioeconomic History  . Marital status: Married    Spouse name: Shelbea Bolis  . Number of children: 3  . Years of education: Not on file  . Highest education level: Not on file  Occupational History  . Occupation: retired    Fish farm manager: UNEMPLOYED  Tobacco Use  . Smoking status: Former Smoker    Packs/day: 0.50    Years: 18.00    Pack years: 9.00    Types: Cigarettes    Quit date: 09/16/1990    Years since quitting: 29.1  . Smokeless tobacco: Never Used  Substance and Sexual Activity  . Alcohol use: No    Alcohol/week: 0.0 standard drinks  . Drug use: No  . Sexual activity: Not on file  Other Topics Concern  . Not on file  Social History Narrative  . Not on file   Social Determinants of Health   Financial Resource Strain:   . Difficulty of Paying Living Expenses: Not on file  Food Insecurity:   . Worried About Charity fundraiser in the Last Year: Not on file  . Ran Out of Food in the Last Year: Not on file  Transportation Needs:   . Lack of Transportation (Medical): Not on file  . Lack of Transportation (Non-Medical): Not on file  Physical Activity:   . Days of Exercise per Week: Not on file  . Minutes of Exercise per Session: Not on file  Stress:   . Feeling of Stress : Not on file  Social Connections:   . Frequency of Communication with Friends and Family: Not on file  . Frequency of Social Gatherings with Friends and Family: Not on file  . Attends Religious Services: Not on file  . Active Member of Clubs or Organizations: Not on file  . Attends Theatre manager Meetings: Not on file  . Marital Status: Not on file  Intimate Partner Violence:   . Fear of Current or Ex-Partner: Not on file  . Emotionally Abused: Not on file  . Physically Abused: Not on file  . Sexually Abused: Not on file    Family History  Problem Relation Age of Onset  . COPD Sister   .  Diabetes Sister   . Breast cancer Sister        Mastectomy  . CVA Mother 57  . Heart disease Mother   . Diabetes Mother 43  . Lung cancer Father        lung carcinoma  . Colon cancer Neg Hx     ROS- All systems are reviewed and negative except as per the HPI above  Physical Exam: Vitals:   11/15/19 1130  BP: (!) 160/90  Pulse: 98  Weight: 90.1 kg  Height: 5\' 8"  (1.727 m)   Wt Readings from Last 3 Encounters:  11/15/19 90.1 kg  11/01/19 90.7 kg  11/01/19 90.7 kg    Labs: Lab Results  Component Value Date   NA 139 10/06/2019   K 4.7 10/06/2019   CL 101 10/06/2019   CO2 25 10/06/2019   GLUCOSE 123 (H) 10/06/2019   BUN 12 10/06/2019   CREATININE 0.73 10/06/2019   CALCIUM 10.2 10/06/2019   PHOS 4.5 09/21/2019   MG 1.6 (L) 09/24/2019   Lab Results  Component Value Date   INR 1.3 (H) 09/20/2019   Lab Results  Component Value Date   CHOL 147 08/17/2014   HDL 51.30 08/17/2014   LDLCALC 79 08/17/2014   TRIG 84.0 08/17/2014     GEN- The patient is well appearing, alert and oriented x 3 today.   Head- normocephalic, atraumatic Eyes-  Sclera clear, conjunctiva pink Ears- hearing intact Oropharynx- clear Neck- supple, no JVP Lymph- no cervical lymphadenopathy Lungs- Clear to ausculation bilaterally, normal work of breathing Heart-irregular rate and rhythm, no murmurs, rubs or gallops, PMI not laterally displaced GI- soft, NT, ND, + BS Extremities- no clubbing, cyanosis, or edema MS- no significant deformity or atrophy Skin- no rash or lesion Psych- euthymic mood, full affect Neuro- strength and sensation are intact  EKG-afib at 98 bpm, qrs int  86 ms, qtc 451 ms.   Assessment and Plan: 1. Persisitent afib Here for dofetilide admit per Dr. [name] Bmet/mag today  She states that drug will cost her less than  $2.00 for the month No qtc prolonging drugs on board  No benadryl use  bmet/mag today   2. [id number] score of at least 4 No missed doses of eliquis 5 mg bid for at least 3 weeks   To 6E 25    Butch Penny C. Alixis Harmon, [facility] [address] [place], [place] [address] [phone]

## 2019-11-15 NOTE — Progress Notes (Signed)
Pharmacy: Dofetilide (Tikosyn) - Initial Consult Assessment and Electrolyte Replacement  Pharmacy consulted to assist in monitoring and replacing electrolytes in this 74 y.o. female admitted on 11/15/2019 undergoing dofetilide initiation. First dofetilide dose planned for tonight 11/15/19.   Assessment:  Patient Exclusion Criteria: If any screening criteria checked as "Yes", then  patient  should NOT receive dofetilide until criteria item is corrected.  If "Yes" please indicate correction plan.  YES  NO Patient  Exclusion Criteria Correction Plan   []   [x]   Baseline QTc interval is greater than or equal to 440 msec. IF above YES box checked dofetilide contraindicated unless patient has ICD; then may proceed if QTc 500-550 msec or with known ventricular conduction abnormalities may proceed with QTc 550-600 msec. QTc = 0.39    []   [x]   Patient is known or suspected to have a digoxin level greater than 2 ng/ml: No results found for: DIGOXIN     []   [x]   Creatinine clearance less than 20 ml/min (calculated using Cockcroft-Gault, actual body weight and serum creatinine): Estimated Creatinine Clearance: 73 mL/min (by C-G formula based on SCr of 0.73 mg/dL).     []   [x]  Patient has received drugs known to prolong the QT intervals within the last 48 hours (phenothiazines, tricyclics or tetracyclic antidepressants, erythromycin, H-1 antihistamines, cisapride, fluoroquinolones, azithromycin). Updated information on QT prolonging agents is available to be searched on the following database:QT prolonging agents     []   [x]   Patient received a dose of hydrochlorothiazide (Oretic) alone or in any combination including triamterene (Dyazide, Maxzide) in the last 48 hours.    []   [x]  Patient received a medication known to increase dofetilide plasma concentrations prior to initial dofetilide dose:  . Trimethoprim (Primsol, Proloprim) in the last 36 hours . Verapamil (Calan, Verelan) in the last 36  hours or a sustained release dose in the last 72 hours . Megestrol (Megace) in the last 5 days  . Cimetidine (Tagamet) in the last 6 hours . Ketoconazole (Nizoral) in the last 24 hours . Itraconazole (Sporanox) in the last 48 hours  . Prochlorperazine (Compazine) in the last 36 hours     []   [x]   Patient is known to have a history of torsades de pointes; congenital or acquired long QT syndromes.    []   [x]   Patient has received a Class 1 antiarrhythmic with less than 2 half-lives since last dose. (Disopyramide, Quinidine, Procainamide, Lidocaine, Mexiletine, Flecainide, Propafenone)    []   [x]   Patient has received amiodarone therapy in the past 3 months or amiodarone level is greater than 0.3 ng/ml.    Patient has been appropriately anticoagulated with apixaban.  Labs:    Component Value Date/Time   K 4.5 11/15/2019 1231   MG 1.7 11/15/2019 1231     Plan: Potassium: K >/= 4: Appropriate to initiate Tikosyn, no replacement needed    Magnesium: Mg 1.3-1.7: Hold Tikosyn initiation and give Mg 4 gm x1 IV, recheck Mg 4hr after end of infusion - repeat appropriate dose if not > 1.8     Thank you for allowing pharmacy to participate in this patient's care   Erin Hearing PharmD., BCPS Clinical Pharmacist 11/15/2019 1:20 PM

## 2019-11-15 NOTE — H&P (Signed)
EP Attending  EP Admit Note   Primary Care Physician: Debbie Noon, MD  Referring Physician: Dr. [name] is a 74 y.o. female with a h/o persistent afib that is in the afib clinic today for tikosyn admit per Dr. Lovena [name]. She states no missed doses of eliquis for at least 3 weeks. No benadryl use. She has been taking magnesium for low magnesium in the last couple of weeks. Drugs have been screened by PharmD and no qt prolonging drugs on board.Wears O2 at 3l/min.  Today, she denies symptoms of palpitations, chest pain, shortness of breath, orthopnea, PND, lower extremity edema, dizziness, presyncope, syncope, or neurologic sequela. The patient is tolerating medications without difficulties and is otherwise without complaint today.      Past Medical History:  Diagnosis Date  . Adenomatous polyp 12/04/2006  . Asthma   . COPD (chronic obstructive pulmonary disease) ([place])   . DM type 2 (diabetes mellitus, type 2) ([place])   . Hemorrhoid 12/04/2006  . Hyperlipidemia   . Hypertension   . Peripheral arterial disease ([place])   . Sepsis ([place]) 09/22/2019        Past Surgical History:  Procedure Laterality Date  . CARDIOVERSION N/A 10/18/2019   Procedure: CARDIOVERSION; Surgeon: Debbie Hector, MD; Location: [facility] ENDOSCOPY; Service: Cardiovascular; Laterality: N/A;  . CHOLECYSTECTOMY    . COLONOSCOPY  12/03/2006   Dr. Delight [name], adenomatous polyp  . COLONOSCOPY  03/25/2012   Procedure: COLONOSCOPY; Surgeon: Debbie Dolin, MD; Location: [facility] ENDO SUITE; Service: Endoscopy; Laterality: N/A; 10:30  . ESOPHAGOGASTRODUODENOSCOPY  11/03/2002   Dr. Gala [name]- normal exam- was done to check for possible foreign body  . Fiberoptic bronchoscopy with endobronchial ultrasound  10/22/2010   Debbie [name]  . Right BKA  1990  . RIGHT HEART CATHETERIZATION N/A 06/22/2014   Procedure: RIGHT HEART CATH; Surgeon: Debbie Dresser, MD; Location: [facility] CATH LAB; Service: Cardiovascular; Laterality: N/A;          Current Outpatient Medications  Medication Sig Dispense Refill  . apixaban (ELIQUIS) 5 MG TABS tablet Take 1 tablet (5 mg total) by mouth 2 (two) times daily. 60 tablet 0  . BD PEN NEEDLE NANO U/F 32G X 4 MM MISC 2 (two) times daily. as directed  6  . Calcium Carb-Cholecalciferol (CALCIUM 1000 + D PO) Take 1,000 mg by mouth daily.    . Coenzyme Q10 300 MG CAPS Take 300 mg by mouth daily.    Marland Kitchen dexlansoprazole (DEXILANT) 60 MG capsule Take 1 capsule (60 mg total) by mouth daily.    Marland Kitchen diltiazem (CARDIZEM CD) 240 MG 24 hr capsule Take 1 capsule (240 mg total) by mouth daily. 30 capsule 0  . ezetimibe (ZETIA) 10 MG tablet Take 10 mg by mouth every evening.     . fluticasone (FLONASE) 50 MCG/ACT nasal spray Place 1 spray into the nose daily as needed for allergies.     . furosemide (LASIX) 40 MG tablet Take 0.5 tablets (20 mg total) by mouth daily as needed for fluid or edema. 30 tablet 0  . gabapentin (NEURONTIN) 100 MG capsule Take 100-200 mg by mouth See admin instructions. Take 200 mg in the morning and 100 mg at night    . halobetasol (ULTRAVATE) 0.05 % cream Apply 1 application topically 2 (two) times daily as needed (psoriasis).     . ibandronate (BONIVA) 150 MG tablet Take 150 mg by mouth every 30 (thirty) days.    Marland Kitchen ibuprofen (ADVIL,MOTRIN) 200 MG tablet Take  400 mg by mouth every 6 (six) hours as needed for headache, mild pain or moderate pain.     . Insulin Glargine (BASAGLAR KWIKPEN) 100 UNIT/ML SOPN Inject 45 Units into the skin daily.     . Insulin Pen Needle (BD PEN NEEDLE NANO U/F) 32G X 4 MM MISC USE TWICE DAILY AS DIRECTED    . ketotifen (ZADITOR) 0.025 % ophthalmic solution Place 1 drop into both eyes daily as needed (dry eyes).    Marland Kitchen losartan (COZAAR) 100 MG tablet Take 0.5 tablets (50 mg total) by mouth daily. 30 tablet 0  . Magnesium 250 MG TABS Take by mouth.    . OXYGEN Place 3 L into the nose See admin instructions. 3 lpm with sleep and exertion  APS     .  SitaGLIPtin-MetFORMIN HCl (JANUMET XR) 50-1000 MG TB24 Take 1 tablet by mouth 2 (two) times daily.     Marland Kitchen tiZANidine (ZANAFLEX) 4 MG tablet Take 4 mg by mouth at bedtime.    . VOLTAREN 1 % GEL Apply 2 g topically 4 (four) times daily as needed (pain).   2   No current facility-administered medications for this encounter.        Allergies  Allergen Reactions  . Meloxicam Other (See Comments)    Causes excess Fluid buildup   Social History        Socioeconomic History  . Marital status: Married    Spouse name: Debbie [name]  . Number of children: 3  . Years of education: Not on file  . Highest education level: Not on file  Occupational History  . Occupation: retired    Fish farm manager: UNEMPLOYED  Tobacco Use  . Smoking status: Former Smoker    Packs/day: 0.50    Years: 18.00    Pack years: 9.00    Types: Cigarettes    Quit date: 09/16/1990    Years since quitting: 29.1  . Smokeless tobacco: Never Used  Substance and Sexual Activity  . Alcohol use: No    Alcohol/week: 0.0 standard drinks  . Drug use: No  . Sexual activity: Not on file  Other Topics Concern  . Not on file  Social History Narrative  . Not on file   Social Determinants of Health      Financial Resource Strain:   . Difficulty of Paying Living Expenses: Not on file  Food Insecurity:   . Worried About Charity fundraiser in the Last Year: Not on file  . Ran Out of Food in the Last Year: Not on file  Transportation Needs:   . Lack of Transportation (Medical): Not on file  . Lack of Transportation (Non-Medical): Not on file  Physical Activity:   . Days of Exercise per Week: Not on file  . Minutes of Exercise per Session: Not on file  Stress:   . Feeling of Stress : Not on file  Social Connections:   . Frequency of Communication with Friends and Family: Not on file  . Frequency of Social Gatherings with Friends and Family: Not on file  . Attends Religious Services: Not on file  . Active Member of Clubs or  Organizations: Not on file  . Attends Archivist Meetings: Not on file  . Marital Status: Not on file  Intimate Partner Violence:   . Fear of Current or Ex-Partner: Not on file  . Emotionally Abused: Not on file  . Physically Abused: Not on file  . Sexually Abused: Not on file  Family History  Problem Relation Age of Onset  . COPD Sister   . Diabetes Sister   . Breast cancer Sister    Mastectomy  . CVA Mother 60  . Heart disease Mother   . Diabetes Mother 18  . Lung cancer Father    lung carcinoma  . Colon cancer Neg Hx    ROS- All systems are reviewed and negative except as per the HPI above  Physical Exam:     Vitals:   11/15/19 1130  BP: (!) 160/90  Pulse: 98  Weight: 90.1 kg  Height: 5\' 8"  (1.727 m)      Wt Readings from Last 3 Encounters:  11/15/19 90.1 kg  11/01/19 90.7 kg  11/01/19 90.7 kg   Labs:  Recent Labs                                                                             Recent Labs                       Recent Labs                                        GEN- The patient is well appearing, alert and oriented x 3 today.  Head- normocephalic, atraumatic  Eyes- Sclera clear, conjunctiva pink  Ears- hearing intact  Oropharynx- clear  Neck- supple, no JVP  Lymph- no cervical lymphadenopathy  Lungs- Clear to ausculation bilaterally, normal work of breathing  Heart-irregular rate and rhythm, no murmurs, rubs or gallops, PMI not laterally displaced  GI- soft, NT, ND, + BS  Extremities- no clubbing, cyanosis, or edema  MS- no significant deformity or atrophy  Skin- no rash or lesion  Psych- euthymic mood, full affect  Neuro- strength and sensation are intact  EKG-afib at 98 bpm, qrs int 86 ms, qtc 451 ms.  Assessment and Plan:  1. Persisitent afib  Here for dofetilide admit per Dr. Lovena [name]  Bmet/mag today  She states that drug will cost her less than $2.00 for the month  No qtc prolonging drugs on  board  No benadryl use  bmet/mag today  2. CHA2DS2VASc score of at least 4  No missed doses of eliquis 5 mg bid for at least 3 weeks  To 6E 25  Butch Penny C. Mila Homer  [facility]  [address]  [place],  [address]  [phone]  EP Attending  Patient seen and examined. Agree with the findings as noted above. The patient presents today for initiation of dofetilide for the treatment of atrial fib. Her magnesium is a little low. She will undergo supplementation and then initiation of dofetilide. She understands she will be in the hospital for 3.5 days.   Mikle Bosworth.D.

## 2019-11-16 ENCOUNTER — Encounter (HOSPITAL_COMMUNITY): Payer: Self-pay | Admitting: Internal Medicine

## 2019-11-16 ENCOUNTER — Encounter (HOSPITAL_COMMUNITY): Payer: Self-pay | Admitting: Certified Registered Nurse Anesthetist

## 2019-11-16 LAB — BASIC METABOLIC PANEL
Anion gap: 11 (ref 5–15)
BUN: 12 mg/dL (ref 8–23)
CO2: 24 mmol/L (ref 22–32)
Calcium: 9 mg/dL (ref 8.9–10.3)
Chloride: 102 mmol/L (ref 98–111)
Creatinine, Ser: 0.92 mg/dL (ref 0.44–1.00)
GFR calc Af Amer: >60 mL/min (ref >60)
GFR calc non Af Amer: >60 mL/min (ref >60)
Glucose, Bld: 155 mg/dL — ABNORMAL HIGH (ref 70–99)
Potassium: 4.4 mmol/L (ref 3.5–5.1)
Sodium: 137 mmol/L (ref 135–145)

## 2019-11-16 LAB — GLUCOSE, CAPILLARY
Glucose-Capillary: 125 mg/dL — ABNORMAL HIGH (ref 70–99)
Glucose-Capillary: 137 mg/dL — ABNORMAL HIGH (ref 70–99)
Glucose-Capillary: 114 mg/dL — ABNORMAL HIGH (ref 70–99)
Glucose-Capillary: 161 mg/dL — ABNORMAL HIGH (ref 70–99)

## 2019-11-16 LAB — MAGNESIUM: Magnesium: 1.8 mg/dL (ref 1.7–2.4)

## 2019-11-16 LAB — PROTIME-INR
INR: 1.2 (ref 0.8–1.2)
Prothrombin Time: 14.6 seconds (ref 11.4–15.2)

## 2019-11-16 MED ORDER — PANTOPRAZOLE SODIUM 40 MG PO TBEC
40.0000 mg | DELAYED_RELEASE_TABLET | Freq: Every day | ORAL | Status: DC
  Administered 2019-11-16 – 2019-11-18 (×3): 40 mg via ORAL
  Filled 2019-11-16 (×3): qty 1

## 2019-11-16 MED ORDER — ACETAMINOPHEN 500 MG PO TABS
500.0000 mg | ORAL_TABLET | Freq: Four times a day (QID) | ORAL | Status: DC | PRN
  Administered 2019-11-16: 500 mg via ORAL
  Filled 2019-11-16: qty 1

## 2019-11-16 MED ORDER — ORAL CARE MOUTH RINSE
15.0000 mL | Freq: Two times a day (BID) | OROMUCOSAL | Status: DC
  Administered 2019-11-16 – 2019-11-18 (×3): 15 mL via OROMUCOSAL

## 2019-11-16 MED ORDER — HYDROCORTISONE 1 % EX CREA
1.0000 "application " | TOPICAL_CREAM | Freq: Three times a day (TID) | CUTANEOUS | Status: DC | PRN
  Filled 2019-11-16: qty 28

## 2019-11-16 MED ORDER — MAGNESIUM SULFATE 2 GM/50ML IV SOLN
2.0000 g | Freq: Once | INTRAVENOUS | Status: AC
  Administered 2019-11-16: 2 g via INTRAVENOUS
  Filled 2019-11-16: qty 50

## 2019-11-16 NOTE — Progress Notes (Signed)
Post dose EKG is reviewed.   AFib 78bpm QT is difficult to assess in Afib Clearest QT that I see is 1st beat in lead III, measured 462ms (unknown R-R prior to that beat) EKG HR is 78bpm  Telemetry reviewed AFib generally 70's, infrequent PVCs, rare couplet  Will continue 566mcg dose tonight  Debbie [name] Standard, PA-C

## 2019-11-16 NOTE — Progress Notes (Signed)
Pharmacy: Dofetilide (Tikosyn) - Follow Up Assessment and Electrolyte Replacement  Pharmacy consulted to assist in monitoring and replacing electrolytes in this 74 y.o. female admitted on 11/15/2019 undergoing dofetilide initiation. First dofetilide dose: 11/15/19  Labs:    Component Value Date/Time   K 4.4 11/16/2019 0323   MG 1.8 11/16/2019 0323     Plan: Potassium: K >/= 4: No additional supplementation needed  Magnesium: Mg 1.8-2: Give Mg 2 gm IV x1    Patient has not required potassium replacement. Patient is currently receiving magnesium supplements.    Thank you for allowing pharmacy to participate in this patient's care   Erin Hearing PharmD., BCPS Clinical Pharmacist 11/16/2019 11:02 AM

## 2019-11-16 NOTE — Progress Notes (Addendum)
Progress Note  Patient Name: Debbie [name] Date of Encounter: 11/16/2019  Primary Cardiologist: Dr. Burt Knack Electrophysiologist: Dr. [name]  Subjective   No complaints  Inpatient Medications    Scheduled Meds:  apixaban  5 mg Oral BID   calcium-vitamin D  1 tablet Oral Daily   diltiazem  240 mg Oral Daily   dofetilide  500 mcg Oral BID   ezetimibe  10 mg Oral QPM   gabapentin  100 mg Oral QHS   insulin glargine  30 Units Subcutaneous Daily   linagliptin  5 mg Oral Daily   losartan  50 mg Oral Daily   magnesium oxide  400 mg Oral q morning - 10a   metFORMIN  500 mg Oral BID WC   pantoprazole  40 mg Oral Daily   sodium chloride flush  3 mL Intravenous Q12H   tiZANidine  4 mg Oral QHS   Continuous Infusions:  sodium chloride     PRN Meds: sodium chloride, diclofenac Sodium, ketotifen, sodium chloride flush   Vital Signs    Vitals:   11/15/19 1315 11/15/19 2040 11/16/19 0647  BP: (!) 157/96 (!) 149/94 138/71  Pulse: 90 74 73  Temp: 98.3 F (36.8 C) (!) 97.4 F (36.3 C) 97.6 F (36.4 C)  TempSrc: Oral Oral Oral  SpO2: 98% 98% 97%  Weight: 88.6 kg  87.7 kg  Height: 5\' 8"  (1.727 m)      Intake/Output Summary (Last 24 hours) at 11/16/2019 [id number] Last data filed at 11/15/2019 2130 Gross per 24 hour  Intake 360 ml  Output --  Net 360 ml   Last 3 Weights 11/16/2019 11/15/2019 11/15/2019  Weight (lbs) 193 lb 5.5 oz 195 lb 6.4 oz 198 lb 9.6 oz  Weight (kg) 87.7 kg 88.633 kg 90.084 kg      Telemetry    AFib  60's - Personally Reviewed  ECG    AFib 82bpm, manually measured QT 470ms, QTc 458ms - Personally Reviewed  Physical Exam   GEN: No acute distress.   Neck: No JVD Cardiac: irreg-irreg, no murmurs, rubs, or gallops.  Respiratory: CTA b/l. GI: Soft, nontender, non-distended  MS: No edema LLE; R BKA Neuro:  Nonfocal  Psych: Normal affect   Labs    High Sensitivity Troponin:  No results for input(s): TROPONINIHS in the last 720 hours.     Chemistry Recent Labs  Lab 11/15/19 1231 11/16/19 0323  NA 139 137  K 4.5 4.4  CL 102 102  CO2 26 24  GLUCOSE 128* 155*  BUN 10 12  CREATININE 0.73 0.92  CALCIUM 10.0 9.0  GFRNONAA >60 >60  GFRAA >60 >60  ANIONGAP 11 11     HematologyNo results for input(s): WBC, RBC, HGB, HCT, MCV, MCH, MCHC, RDW, PLT in the last 168 hours.  BNPNo results for input(s): BNP, PROBNP in the last 168 hours.   DDimer No results for input(s): DDIMER in the last 168 hours.   Radiology    No results found.  Cardiac Studies   09/22/2019: TTE IMPRESSIONS  1. Left ventricular ejection fraction, by visual estimation, is 50 to  55%. The left ventricle has normal function. There is no left ventricular  hypertrophy.   2. Left ventricular diastolic parameters are indeterminate.   3. Global right ventricle has moderately reduced systolic function.The  right ventricular size is mildly enlarged.   4. Left atrial size was normal.   5. Right atrial size was normal.   6. The mitral valve is  normal in structure. Mild mitral valve  regurgitation.   7. The tricuspid valve is normal in structure.   8. The aortic valve is tricuspid. Aortic valve regurgitation is not  visualized. No evidence of aortic valve sclerosis or stenosis.   9. The pulmonic valve was not well visualized. Pulmonic valve  regurgitation is not visualized.  10. The inferior vena cava is dilated in size with <50% respiratory  variability, suggesting right atrial pressure of 15 mmHg.  11. The tricuspid regurgitant velocity is 3.34 m/s, and with an assumed  right atrial pressure of 15 mmHg, the estimated right ventricular systolic  pressure is moderately elevated at 59.6 mmHg.   Patient Profile     74 y.o. female COPD (on home O2), HTN, HLD, DM, p.HTN, s/p thromboangiitis obliterans R BKA in 1990, admitted 09/2019 with sepsis/septic shock 2nd to UTI, AKi and metabolic encephalopathy and AFib  Admitted for Tikosyn  initiation  Assessment & Plan    1. Persistent AFib     CHA2DS2Vasc is 4, on Eliquis, appropriately dosed     K+ 4.4     Mag 1.8, replace further per pharmacist     Creat 0.92 (stable)     QTc looks stable  DCCV tomorrow if not in SR, pt is aware and agreeable  2. O2 dep COPD     Continue home O2 here  3. DM     contiue home regime  4. HTN     Looks OK, continue home meds    For questions or updates, please contact [place] Please consult [email or web address] for contact info under     Signed, Baldwin [place], PA-C  11/16/2019, 7:22 AM    EP Attending  Patient seen and examined. Agree with the findings as noted above. The patient remains in rate controlled atrial fib after her first dose of dofetilide. She will continue. Her QT is acceptable. DCCV tomorrow if she does not revert back to NSR.  Mikle Bosworth.D.

## 2019-11-16 NOTE — Care Management (Signed)
11-16-19 Patient presented for Atrial Fib- Tikosyn Load. Benefits check submitted regarding cost. Case Manager will discuss cost with patient. The hospital no longer has samples available. Patient will be able to utilize [place] for [place]. Case Manager will continue to follow for additional transition of care needs. Bethena Roys, RN,BSN Case Manager

## 2019-11-17 ENCOUNTER — Encounter (HOSPITAL_COMMUNITY)
Admission: EM | Disposition: A | Discharge status: Home / Self Care | Payer: Self-pay | Source: Ambulatory Visit | Attending: Internal Medicine

## 2019-11-17 LAB — BASIC METABOLIC PANEL
Anion gap: 11 (ref 5–15)
BUN: 14 mg/dL (ref 8–23)
CO2: 25 mmol/L (ref 22–32)
Calcium: 9.4 mg/dL (ref 8.9–10.3)
Chloride: 101 mmol/L (ref 98–111)
Creatinine, Ser: 0.63 mg/dL (ref 0.44–1.00)
GFR calc Af Amer: >60 mL/min (ref >60)
GFR calc non Af Amer: >60 mL/min (ref >60)
Glucose, Bld: 112 mg/dL — ABNORMAL HIGH (ref 70–99)
Potassium: 4.4 mmol/L (ref 3.5–5.1)
Sodium: 137 mmol/L (ref 135–145)

## 2019-11-17 LAB — MAGNESIUM: Magnesium: 1.8 mg/dL (ref 1.7–2.4)

## 2019-11-17 LAB — GLUCOSE, CAPILLARY
Glucose-Capillary: 105 mg/dL — ABNORMAL HIGH (ref 70–99)
Glucose-Capillary: 103 mg/dL — ABNORMAL HIGH (ref 70–99)
Glucose-Capillary: 98 mg/dL (ref 70–99)
Glucose-Capillary: 128 mg/dL — ABNORMAL HIGH (ref 70–99)

## 2019-11-17 SURGERY — CARDIOVERSION
Anesthesia: General

## 2019-11-17 MED ORDER — DOFETILIDE 250 MCG PO CAPS
250.0000 ug | ORAL_CAPSULE | Freq: Two times a day (BID) | ORAL | Status: DC
  Administered 2019-11-17 – 2019-11-18 (×3): 250 ug via ORAL
  Filled 2019-11-17 (×3): qty 1

## 2019-11-17 MED ORDER — MAGNESIUM SULFATE 2 GM/50ML IV SOLN
2.0000 g | Freq: Once | INTRAVENOUS | Status: AC
  Administered 2019-11-17: 2 g via INTRAVENOUS
  Filled 2019-11-17: qty 50

## 2019-11-17 MED ORDER — MAGNESIUM OXIDE 400 (241.3 MG) MG PO TABS
400.0000 mg | ORAL_TABLET | Freq: Two times a day (BID) | ORAL | Status: DC
  Administered 2019-11-17 – 2019-11-18 (×2): 400 mg via ORAL
  Filled 2019-11-17 (×2): qty 1

## 2019-11-17 NOTE — Progress Notes (Signed)
Pharmacy: Dofetilide (Tikosyn) - Follow Up Assessment and Electrolyte Replacement  Pharmacy consulted to assist in monitoring and replacing electrolytes in this 74 y.o. female admitted on 11/15/2019 undergoing dofetilide initiation. First dofetilide dose: 11/15/19  Labs:    Component Value Date/Time   K 4.4 11/17/2019 0554   MG 1.8 11/17/2019 0554     Plan: Potassium: K >/= 4: No additional supplementation needed  Magnesium: Mg 1.8-2: Give Mg 2 gm IV x1 - will increase MagOx to 400 mg BID   Patient has not required potassium replacement. Patient is currently receiving magnesium supplements.    Thank you for allowing pharmacy to participate in this patient's care   Antonietta Jewel, PharmD, [place] Pharmacist  Phone: [phone]  Please check AMION for all [place] phone numbers After 10:00 PM, call [place] [phone] 11/17/2019 9:26 AM

## 2019-11-17 NOTE — Progress Notes (Signed)
Paged Cards on call with information about pt HR dropping in the 30's non sustaining. Pt BP 137/88 and continues to be asymptomatic. Will continue to monitor.  Mallie Snooks

## 2019-11-17 NOTE — Progress Notes (Addendum)
Post dose EKG is reviewed with Dr. [name] SR 71bpm, manually measured QT 486ms, QTc 4107ms  tikosyn teaching completed Will get her 1st fill here and then her [facility] going forward  Continue Tikosyn 256mcg   Tommye Standard, PA-C  EP Attending  Agree.   Mikle Bosworth.D.

## 2019-11-17 NOTE — Progress Notes (Addendum)
Progress Note  Patient Name: Debbie [name] Date of Encounter: 11/17/2019  Primary Cardiologist: Dr. Burt Knack Electrophysiologist: Dr. [name]  Subjective   No complaints  Inpatient Medications    Scheduled Meds:  apixaban  5 mg Oral BID   calcium-vitamin D  1 tablet Oral Daily   diltiazem  240 mg Oral Daily   dofetilide  250 mcg Oral BID   ezetimibe  10 mg Oral QPM   gabapentin  100 mg Oral QHS   insulin glargine  30 Units Subcutaneous Daily   linagliptin  5 mg Oral Daily   losartan  50 mg Oral Daily   magnesium oxide  400 mg Oral q morning - 10a   mouth rinse  15 mL Mouth Rinse BID   metFORMIN  500 mg Oral BID WC   pantoprazole  40 mg Oral Daily   sodium chloride flush  3 mL Intravenous Q12H   tiZANidine  4 mg Oral QHS   Continuous Infusions:  sodium chloride     PRN Meds: sodium chloride, acetaminophen, diclofenac Sodium, hydrocortisone cream, ketotifen, sodium chloride flush   Vital Signs    Vitals:   11/16/19 1440 11/16/19 2002 11/17/19 0602 11/17/19 0802  BP: (!) 156/85 137/88 (!) 144/67 (!) 172/89  Pulse: 72 100 (!) 45 90  Resp: 19     Temp: 97.9 F (36.6 C) 97.6 F (36.4 C) (!) 97.3 F (36.3 C)   TempSrc: Oral Oral Oral   SpO2: 99% 93% 96%   Weight:   88.3 kg   Height:        Intake/Output Summary (Last 24 hours) at 11/17/2019 0904 Last data filed at 11/17/2019 [id number] Gross per 24 hour  Intake 846 ml  Output --  Net 846 ml   Last 3 Weights 11/17/2019 11/16/2019 11/15/2019  Weight (lbs) 194 lb 10.7 oz 193 lb 5.5 oz 195 lb 6.4 oz  Weight (kg) 88.3 kg 87.7 kg 88.633 kg      Telemetry    AFib> flutter with nocturnal HR 40's >> SR this AM - Personally Reviewed  ECG    EKG this AM in sinus rhythm is reviewed with Dr. [name], 74bpm, QT 455ms, (looks longer in some leads) - Personally Reviewed  Physical Exam   GEN: No acute distress.   Neck: No JVD Cardiac: RRR, no murmurs, rubs, or gallops.  Respiratory: CTA b/l. GI: Soft, nontender,  non-distended  MS: No edema LLE; R BKA Neuro:  Nonfocal  Psych: Normal affect   Labs    High Sensitivity Troponin:  No results for input(s): TROPONINIHS in the last 720 hours.    Chemistry Recent Labs  Lab 11/15/19 1231 11/16/19 0323  NA 139 137  K 4.5 4.4  CL 102 102  CO2 26 24  GLUCOSE 128* 155*  BUN 10 12  CREATININE 0.73 0.92  CALCIUM 10.0 9.0  GFRNONAA >60 >60  GFRAA >60 >60  ANIONGAP 11 11     HematologyNo results for input(s): WBC, RBC, HGB, HCT, MCV, MCH, MCHC, RDW, PLT in the last 168 hours.  BNPNo results for input(s): BNP, PROBNP in the last 168 hours.   DDimer No results for input(s): DDIMER in the last 168 hours.   Radiology    No results found.  Cardiac Studies   09/22/2019: TTE IMPRESSIONS  1. Left ventricular ejection fraction, by visual estimation, is 50 to  55%. The left ventricle has normal function. There is no left ventricular  hypertrophy.   2. Left ventricular diastolic  parameters are indeterminate.   3. Global right ventricle has moderately reduced systolic function.The  right ventricular size is mildly enlarged.   4. Left atrial size was normal.   5. Right atrial size was normal.   6. The mitral valve is normal in structure. Mild mitral valve  regurgitation.   7. The tricuspid valve is normal in structure.   8. The aortic valve is tricuspid. Aortic valve regurgitation is not  visualized. No evidence of aortic valve sclerosis or stenosis.   9. The pulmonic valve was not well visualized. Pulmonic valve  regurgitation is not visualized.  10. The inferior vena cava is dilated in size with <50% respiratory  variability, suggesting right atrial pressure of 15 mmHg.  11. The tricuspid regurgitant velocity is 3.34 m/s, and with an assumed  right atrial pressure of 15 mmHg, the estimated right ventricular systolic  pressure is moderately elevated at 59.6 mmHg.   Patient Profile     74 y.o. female COPD (on home O2), HTN, HLD, DM, p.HTN, s/p  thromboangiitis obliterans R BKA in 1990, admitted 09/2019 with sepsis/septic shock 2nd to UTI, AKi and metabolic encephalopathy and AFib  Admitted for Tikosyn initiation  Assessment & Plan    1. Persistent AFib     CHA2DS2Vasc is 4, on Eliquis, appropriately dosed     Labs are pending this AM     QTc, EKG in SR This AM is reviewed with Dr. [name], QT felt to be getting long and will reduce her dose this AM to 259mcg   await labs  Anticipate discharge tomorrow  2. O2 dep COPD     Continue home O2 here  3. DM     contiue home regime  4. HTN     Looks OK, continue home meds    For questions or updates, please contact [place] Please consult [email or web address] for contact info under     Signed, Baldwin [place], PA-C  11/17/2019, 9:04 AM    EP Attending  Patient seen and examined. Agree with the findings as noted above. The patient is back to NSR and her QT is out a little though a little hard to determine from the 12 lead ( small T wave amplitude). We will reduce her dose of dofetilide. DC home tomorrow if QT is ok.   Mikle Bosworth.D.

## 2019-11-17 NOTE — TOC Benefit Eligibility Note (Signed)
Transition of Care [facility]) Benefit Eligibility Note    Patient Details  Name: Debbie [name] MRN: [id number] Date of Birth: December 07, 1945   Medication/Dose: Dofetilide 542mg  Covered?: Yes  Tier: (4)  Prescription [facility]age [facility] [place] [place]en[place]and WDiannia Ruderwith Person/Company/Phone Number:: Cyrus/ [facility]/ [phone] Co-Pay: 1.30 for a 30 day supply or a 90 day supply  Prior Approval: No  Deductible: Met  Additional Notes: TIKOSYN 500 MCG IS NOT [place] Number: 11/17/2019, 8:54 AM

## 2019-11-17 NOTE — Progress Notes (Addendum)
Notified by ccmd that HR dropped to 39 non-sustained- pt asymptomatic(still in afib) will continue to monitor closely Claudine Mouton, RN

## 2019-11-18 LAB — BASIC METABOLIC PANEL
Anion gap: 10 (ref 5–15)
BUN: 13 mg/dL (ref 8–23)
CO2: 26 mmol/L (ref 22–32)
Calcium: 9.6 mg/dL (ref 8.9–10.3)
Chloride: 100 mmol/L (ref 98–111)
Creatinine, Ser: 0.8 mg/dL (ref 0.44–1.00)
GFR calc Af Amer: >60 mL/min (ref >60)
GFR calc non Af Amer: >60 mL/min (ref >60)
Glucose, Bld: 137 mg/dL — ABNORMAL HIGH (ref 70–99)
Potassium: 4.2 mmol/L (ref 3.5–5.1)
Sodium: 136 mmol/L (ref 135–145)

## 2019-11-18 LAB — GLUCOSE, CAPILLARY
Glucose-Capillary: 212 mg/dL — ABNORMAL HIGH (ref 70–99)
Glucose-Capillary: 119 mg/dL — ABNORMAL HIGH (ref 70–99)
Glucose-Capillary: 59 mg/dL — ABNORMAL LOW (ref 70–99)

## 2019-11-18 LAB — MAGNESIUM: Magnesium: 1.7 mg/dL (ref 1.7–2.4)

## 2019-11-18 MED ORDER — DOFETILIDE 250 MCG PO CAPS: 250.0000 ug | ORAL_CAPSULE | Freq: Two times a day (BID) | ORAL | 6 refills | Status: DC

## 2019-11-18 MED ORDER — MAGNESIUM OXIDE 400 (241.3 MG) MG PO TABS: 400.0000 mg | ORAL_TABLET | Freq: Two times a day (BID) | ORAL | 6 refills | Status: DC

## 2019-11-18 MED ORDER — MAGNESIUM SULFATE 4 GM/100ML IV SOLN
4.0000 g | Freq: Once | INTRAVENOUS | Status: AC
  Administered 2019-11-18: 4 g via INTRAVENOUS
  Filled 2019-11-18: qty 100

## 2019-11-18 MED FILL — MAGNESIUM OXIDE 400 MG TABS: 400 | 30 days supply | Qty: 60 | Fill #0

## 2019-11-18 MED FILL — DOFETILIDE 250 MCG CAPS: 250 | 30 days supply | Qty: 60 | Fill #0

## 2019-11-18 NOTE — Discharge Summary (Addendum)
ELECTROPHYSIOLOGY PROCEDURE DISCHARGE SUMMARY    Patient ID: Debbie [name],  MRN: [id number], DOB/AGE: Jan 20, 1946 74 y.o.  Admit date: 11/15/2019 Discharge date: 11/18/2019  Primary Care Physician: [name], MD Primary Cardiologist: Dr. Burt Knack Electrophysiologist: [name]  Primary Discharge Diagnosis:  1.  Persistent atrial fibrillation status post Tikosyn loading this admission       CHA2DS2Vasc is 4, on Eliquis, appropriately dosed  Secondary Discharge Diagnosis:  1. COPD      On home O2 2. HTN 3. P.HTN 4. Remote R BKS 5. DM  Allergies  Allergen Reactions  . Meloxicam Other (See Comments)    Causes excess Fluid buildup     Procedures This Admission:  1.  Tikosyn loading    Brief HPI: Debbie [name] is a 74 y.o. female with a past medical history as noted above.  She is followed by EP in the outpatient setting for treatment options of atrial fibrillation.  Risks, benefits, and alternatives to Tikosyn were reviewed with the patient who wished to proceed.    Hospital Course:  The patient was admitted and Tikosyn was initiated.  Renal function and electrolytes were followed during the hospitalization.  She has required magnesium supplementation throughout her stay and will increase her home replacement.  Her QTc on 577mcg became slightly prolonged requiring down-titration and remained stable at 253mcg doseremained stable.  She converted with drug and did not require DCCV.  The patient was monitored until discharge on telemetry which demonstrated SR, PACs, intermittently she has occasional PVCs, rare couplet.  She had an episode of hypoglycemia with CBG 59, she states she ate very early this AM and very little.  That he current regime of DM management has been working quite well for her and with steady BS readings, a repeat after some juice is 119, she is eating lunch and feeling well.  On the day of discharge, she feels well, can tell she is in normal rhythm  feeling better already.  She was examined by Dr [name] who has also reviewed her final EKG and considered her stable for discharge to home.  Follow-up has been arranged with the AFib clinic in 1 week and with Dr [name] in 4 weeks.   Physical Exam: Vitals:   11/17/19 1440 11/17/19 2041 11/18/19 0440 11/18/19 0815  BP: (!) 166/75 138/70 (!) 159/87 (!) 149/93  Pulse: 66 70 75   Resp:  17 16   Temp: 97.7 F (36.5 C) (!) 97.4 F (36.3 C) 97.6 F (36.4 C)   TempSrc: Oral Oral Oral   SpO2: 98% 94% 98% 98%  Weight:   88.8 kg   Height:        GEN- The patient is well appearing, alert and oriented x 3 today.   HEENT: normocephalic, atraumatic; sclera clear, conjunctiva pink; hearing intact; oropharynx clear; neck supple, no JVP Lymph- no cervical lymphadenopathy Lungs- CTA b/l, normal work of breathing.  No wheezes, rales, rhonchi Heart- RRR, no murmurs, rubs or gallops, PMI not laterally displaced GI- soft, non-tender, non-distended Extremities- no clubbing, cyanosis, or edema MS- R BKA, wearing a prosthetic Skin- warm and dry, no rash or lesion Psych- euthymic mood, full affect Neuro- strength and sensation are intact   Labs:   Lab Results  Component Value Date   WBC 12.9 (H) 10/06/2019   HGB 11.7 10/06/2019   HCT 37.1 10/06/2019   MCV 90 10/06/2019   PLT 380 10/06/2019    Recent Labs  Lab 11/18/19 0402  NA 136  K 4.2  CL 100  CO2 26  BUN 13  CREATININE 0.80  CALCIUM 9.6  GLUCOSE 137*     Discharge Medications:  Allergies as of 11/18/2019      Reactions   Meloxicam Other (See Comments)   Causes excess Fluid buildup      Medication List    STOP taking these medications   Magnesium 250 MG Tabs Replaced by: magnesium oxide 400 (241.3 Mg) MG tablet     TAKE these medications   acetaminophen 500 MG tablet Commonly known as: TYLENOL Take 500 mg by mouth every 6 (six) hours as needed for mild pain.   apixaban 5 MG Tabs tablet Commonly known as: ELIQUIS Take  1 tablet (5 mg total) by mouth 2 (two) times daily.   Basaglar KwikPen 100 UNIT/ML Inject 35 Units into the skin daily.   BD Pen Needle Nano U/F 32G X 4 MM Misc Generic drug: Insulin Pen Needle 2 (two) times daily. as directed   BD Pen Needle Nano U/F 32G X 4 MM Misc Generic drug: Insulin Pen Needle USE TWICE DAILY AS DIRECTED Notes to patient: This appears to be a duplicate, please confirm with prescribing physician   CALCIUM 1000 + D PO Take 1,000 mg by mouth daily.   Coenzyme Q10 300 MG Caps Take 300 mg by mouth daily.   dexlansoprazole 60 MG capsule Commonly known as: Dexilant Take 1 capsule (60 mg total) by mouth daily.   diltiazem 240 MG 24 hr capsule Commonly known as: CARDIZEM CD Take 1 capsule (240 mg total) by mouth daily.   dofetilide 250 MCG capsule Commonly known as: TIKOSYN Take 1 capsule (250 mcg total) by mouth 2 (two) times daily.   ezetimibe 10 MG tablet Commonly known as: ZETIA Take 10 mg by mouth every evening.   fluticasone 50 MCG/ACT nasal spray Commonly known as: FLONASE Place 1 spray into the nose daily as needed for allergies.   furosemide 40 MG tablet Commonly known as: Lasix Take 0.5 tablets (20 mg total) by mouth daily as needed for fluid or edema.   gabapentin 100 MG capsule Commonly known as: NEURONTIN Take 100-200 mg by mouth See admin instructions. Take 200 mg in the morning and 100 mg at night   halobetasol 0.05 % cream Commonly known as: ULTRAVATE Apply 1 application topically 2 (two) times daily as needed (psoriasis).   ibandronate 150 MG tablet Commonly known as: BONIVA Take 150 mg by mouth every 30 (thirty) days.   ibuprofen 200 MG tablet Commonly known as: ADVIL Take 400 mg by mouth every 6 (six) hours as needed for headache, mild pain or moderate pain.   Janumet XR 50-1000 MG Tb24 Generic drug: SitaGLIPtin-MetFORMIN HCl Take 1 tablet by mouth 2 (two) times daily.   lidocaine 5 % Commonly known as: LIDODERM Place  1 patch onto the skin daily. Remove & Discard patch within 12 hours or as directed by MD   losartan 100 MG tablet Commonly known as: COZAAR Take 0.5 tablets (50 mg total) by mouth daily.   magnesium oxide 400 (241.3 Mg) MG tablet Commonly known as: MAG-OX Take 1 tablet (400 mg total) by mouth 2 (two) times daily. Replaces: Magnesium 250 MG Tabs   OXYGEN Place 3 L into the nose See admin instructions. 3 lpm with sleep and exertion  APS   tiZANidine 4 MG tablet Commonly known as: ZANAFLEX Take 4 mg by mouth at bedtime.   Voltaren 1 % Gel Generic  drug: diclofenac Sodium Apply 2 g topically 4 (four) times daily as needed (pain).   Zaditor 0.025 % ophthalmic solution Generic drug: ketotifen Place 1 drop into both eyes daily as needed (dry eyes).       Disposition:  Home Discharge Instructions    Diet - low sodium heart healthy   Complete by: As directed    Increase activity slowly   Complete by: As directed      Follow-up Information    [facility] Follow up.   Specialty: Cardiology Why: 11/25/2019 @ 2:00PM with Maximino Greenland, NP Contact information: [address] [id number] [place]       Evans Lance, MD Follow up.   Specialty: Cardiology Why: 12/22/2019 @ 12:15PM Contact information: [id number] [place] [address] [phone]           Duration of Discharge Encounter: Greater than 30 minutes including physician time.  Venetia Night, PA-C 11/18/2019 11:34 AM  EP Attending  Patient seen and examined. Agree with the findings as noted above. The patient has been loaded with dofetilide. She has reverted to NSR and her dose was adjusted downward. Her QT interval is satisfactory on 250 bid. She will be discharged home with usual followup. Her Mg level has been a little low and she will receive Mg supplements.  Mikle Bosworth.D.

## 2019-11-18 NOTE — Progress Notes (Signed)
Pharmacy: Dofetilide (Tikosyn) - Follow Up Assessment and Electrolyte Replacement  Pharmacy consulted to assist in monitoring and replacing electrolytes in this 74 y.o. female admitted on 11/15/2019 undergoing dofetilide initiation. First dofetilide dose: 11/15/19  Labs:    Component Value Date/Time   K 4.2 11/18/2019 0402   MG 1.7 11/18/2019 0402     Plan: Potassium: K >/= 4: No additional supplementation needed  Magnesium: Mg 1.3-1.7: Give Mg 4 gm IV x1- will continue MagOx to 400 mg BID   Patient has not required potassium replacement. Patient is currently receiving magnesium supplements.    Thank you for allowing pharmacy to participate in this patient's care   Antonietta Jewel, PharmD, [place] Pharmacist  Phone: [phone]  Please check AMION for all [place] phone numbers After 10:00 PM, call [place] [phone] 11/18/2019 7:39 AM

## 2019-11-18 NOTE — Discharge Instructions (Signed)

## 2019-11-18 NOTE — Progress Notes (Addendum)
Hypoglycemic Event  CBG: 59  Treatment: 4oz Orange juice Symptoms: tremors  Follow-up CBG: Time: 1120 CBG Result: 119  Possible Reasons for Event: Ate breakfast early.  Comments: Pt sitting on the side of the bed eating graham crackers. States she feels better after 4oz of juice was given. Will continue to monitor.     Debbie [name]

## 2019-11-25 ENCOUNTER — Other Ambulatory Visit: Payer: Self-pay

## 2019-11-25 ENCOUNTER — Ambulatory Visit (HOSPITAL_COMMUNITY)
Admit: 2019-11-25 | Discharge: 2019-11-25 | Disposition: A | Discharge status: Home / Self Care | Payer: Medicare Other | Source: Ambulatory Visit | Attending: Nurse Practitioner | Admitting: Nurse Practitioner

## 2019-11-25 ENCOUNTER — Encounter (HOSPITAL_COMMUNITY): Payer: Self-pay | Admitting: Nurse Practitioner

## 2019-11-25 VITALS — BP 170/70 | HR 72 | Ht 68.0 in | Wt 198.4 lb

## 2019-11-25 DIAGNOSIS — Z79899 Other long term (current) drug therapy: Secondary | ICD-10-CM | POA: Insufficient documentation

## 2019-11-25 DIAGNOSIS — I4891 Unspecified atrial fibrillation: Secondary | ICD-10-CM | POA: Insufficient documentation

## 2019-11-25 DIAGNOSIS — E785 Hyperlipidemia, unspecified: Secondary | ICD-10-CM | POA: Diagnosis not present

## 2019-11-25 DIAGNOSIS — Z7901 Long term (current) use of anticoagulants: Secondary | ICD-10-CM | POA: Insufficient documentation

## 2019-11-25 DIAGNOSIS — I4819 Other persistent atrial fibrillation: Secondary | ICD-10-CM

## 2019-11-25 DIAGNOSIS — I739 Peripheral vascular disease, unspecified: Secondary | ICD-10-CM | POA: Diagnosis not present

## 2019-11-25 DIAGNOSIS — I10 Essential (primary) hypertension: Secondary | ICD-10-CM | POA: Insufficient documentation

## 2019-11-25 DIAGNOSIS — E118 Type 2 diabetes mellitus with unspecified complications: Secondary | ICD-10-CM | POA: Diagnosis not present

## 2019-11-25 DIAGNOSIS — Z794 Long term (current) use of insulin: Secondary | ICD-10-CM | POA: Diagnosis not present

## 2019-11-25 DIAGNOSIS — J449 Chronic obstructive pulmonary disease, unspecified: Secondary | ICD-10-CM | POA: Diagnosis not present

## 2019-11-25 DIAGNOSIS — Z87891 Personal history of nicotine dependence: Secondary | ICD-10-CM | POA: Insufficient documentation

## 2019-11-25 LAB — BASIC METABOLIC PANEL
Anion gap: 13 (ref 5–15)
BUN: 15 mg/dL (ref 8–23)
CO2: 25 mmol/L (ref 22–32)
Calcium: 9.6 mg/dL (ref 8.9–10.3)
Chloride: 103 mmol/L (ref 98–111)
Creatinine, Ser: 0.8 mg/dL (ref 0.44–1.00)
GFR calc Af Amer: >60 mL/min (ref >60)
GFR calc non Af Amer: >60 mL/min (ref >60)
Glucose, Bld: 102 mg/dL — ABNORMAL HIGH (ref 70–99)
Potassium: 4.6 mmol/L (ref 3.5–5.1)
Sodium: 141 mmol/L (ref 135–145)

## 2019-11-25 LAB — MAGNESIUM: Magnesium: 1.7 mg/dL (ref 1.7–2.4)

## 2019-11-25 MED ORDER — LOSARTAN POTASSIUM 100 MG PO TABS: 100.0000 mg | ORAL_TABLET | Freq: Every day | ORAL | 3 refills | Status: DC

## 2019-11-25 NOTE — Patient Instructions (Signed)
Increase losartan to 100 mg a day.

## 2019-11-25 NOTE — Progress Notes (Signed)
Primary Care Physician: [name], MD Referring Physician: [name] is a 74 y.o. female with a h/o persistent afib that is in the afib clinic today for tikosyn admit per Dr. [name]. She states no missed doses of eliquis for at least 3 weeks. No benadryl use. She has been taking magnesium for low magnesium in the last couple of weeks. Drugs have been screened by PharmD and no qt prolonging drugs on board.Wears O2 at 3l/min.  F/u in afib clinic,11/25/19, one week, after tikosyn admit. She is in SR and she feels much improved. qtc stable at 485 ms.She is taking tikosyn on a regular basis.   Today, she denies symptoms of palpitations, chest pain, shortness of breath, orthopnea, PND, lower extremity edema, dizziness, presyncope, syncope, or neurologic sequela. The patient is tolerating medications without difficulties and is otherwise without complaint today.   Past Medical History:  Diagnosis Date  . A-fib ([place]) 11/15/2019  . Adenomatous polyp 12/04/2006  . Asthma   . COPD (chronic obstructive pulmonary disease) ([place])   . DM type 2 (diabetes mellitus, type 2) ([place])   . Hemorrhoid 12/04/2006  . Hyperlipidemia   . Hypertension   . Peripheral arterial disease ([place])   . Sepsis ([place]) 09/22/2019   Past Surgical History:  Procedure Laterality Date  . CARDIOVERSION N/A 10/18/2019   Procedure: CARDIOVERSION;  Surgeon: [name], MD;  Location: [facility] ENDOSCOPY;  Service: Cardiovascular;  Laterality: N/A;  . CHOLECYSTECTOMY    . COLONOSCOPY  12/03/2006   Dr. Delight Ovens, adenomatous polyp  . COLONOSCOPY  03/25/2012   Procedure: COLONOSCOPY;  Surgeon: Daneil Dolin, MD;  Location: [facility] ENDO SUITE;  Service: Endoscopy;  Laterality: N/A;  10:30  . ESOPHAGOGASTRODUODENOSCOPY  11/03/2002   Dr. Gala Romney- normal exam- was done to check for possible foreign body  . Fiberoptic bronchoscopy with endobronchial  ultrasound  10/22/2010   [place]  . Right BKA  1990  . RIGHT HEART  CATHETERIZATION N/A 06/22/2014   Procedure: RIGHT HEART CATH;  Surgeon: Larey Dresser, MD;  Location[facility] CATH LAB;  Service: Cardiovascular;  Laterality: N/A;    Current Outpatient Medications  Medication Sig Dispense Refill  . acetaminophen (TYLENOL) 500 MG tablet Take 500 mg by mouth every 6 (six) hours as needed for mild pain.    Marland Kitchen apixaban (ELIQUIS) 5 MG TABS tablet Take 1 tablet (5 mg total) by mouth 2 (two) times daily. 60 tablet 0  . BD PEN NEEDLE NANO U/F 32G X 4 MM MISC 2 (two) times daily. as directed  6  . Calcium Carb-Cholecalciferol (CALCIUM 1000 + D PO) Take 1,000 mg by mouth daily.    . Coenzyme Q10 300 MG CAPS Take 300 mg by mouth daily.    Marland Kitchen dexlansoprazole (DEXILANT) 60 MG capsule Take 1 capsule (60 mg total) by mouth daily.    Marland Kitchen diltiazem (CARDIZEM CD) 240 MG 24 hr capsule Take 1 capsule (240 mg total) by mouth daily. 30 capsule 0  . dofetilide (TIKOSYN) 250 MCG capsule Take 1 capsule (250 mcg total) by mouth 2 (two) times daily. 60 capsule 6  . ezetimibe (ZETIA) 10 MG tablet Take 10 mg by mouth every evening.     . fluticasone (FLONASE) 50 MCG/ACT nasal spray Place 1 spray into the nose daily as needed for allergies.     . furosemide (LASIX) 40 MG tablet Take 0.5 tablets (20 mg total) by mouth daily as needed for fluid or edema. (Patient  taking differently: Take 20 mg by mouth as needed for fluid or edema. ) 30 tablet 0  . gabapentin (NEURONTIN) 100 MG capsule Take 100-200 mg by mouth See admin instructions. Take 200 mg in the morning and 100 mg at night    . halobetasol (ULTRAVATE) 0.05 % cream Apply 1 application topically 2 (two) times daily as needed (psoriasis).     . ibandronate (BONIVA) 150 MG tablet Take 150 mg by mouth every 30 (thirty) days.    Marland Kitchen ibuprofen (ADVIL,MOTRIN) 200 MG tablet Take 400 mg by mouth as needed for headache, mild pain or moderate pain.     . Insulin Glargine (BASAGLAR KWIKPEN) 100 UNIT/ML SOPN Inject 35 Units into the skin daily.     . Insulin  Pen Needle (BD PEN NEEDLE NANO U/F) 32G X 4 MM MISC USE TWICE DAILY AS DIRECTED    . ketotifen (ZADITOR) 0.025 % ophthalmic solution Place 1 drop into both eyes daily as needed (dry eyes).    Marland Kitchen lidocaine (LIDODERM) 5 % Place 1 patch onto the skin daily. Remove & Discard patch within 12 hours or as directed by MD    . losartan (COZAAR) 100 MG tablet Take 1 tablet (100 mg total) by mouth daily. 30 tablet 3  . magnesium oxide (MAG-OX) 400 (241.3 Mg) MG tablet Take 1 tablet (400 mg total) by mouth 2 (two) times daily. 60 tablet 6  . OXYGEN Place 3 L into the nose See admin instructions. 3 lpm with sleep and exertion  APS     . SitaGLIPtin-MetFORMIN HCl (JANUMET XR) 50-1000 MG TB24 Take 1 tablet by mouth 2 (two) times daily.     Marland Kitchen tiZANidine (ZANAFLEX) 4 MG tablet Take 4 mg by mouth at bedtime.    . VOLTAREN 1 % GEL Apply 2 g topically 4 (four) times daily as needed (pain).   2   No current facility-administered medications for this encounter.    Allergies  Allergen Reactions  . Meloxicam Other (See Comments)    Causes excess Fluid buildup    Social History   Socioeconomic History  . Marital status: Married    Spouse name: Dominica Mancia  . Number of children: 3  . Years of education: Not on file  . Highest education level: Not on file  Occupational History  . Occupation: retired    Fish farm manager: UNEMPLOYED  Tobacco Use  . Smoking status: Former Smoker    Packs/day: 0.50    Years: 18.00    Pack years: 9.00    Types: Cigarettes    Quit date: 09/16/1990    Years since quitting: 29.2  . Smokeless tobacco: Never Used  Substance and Sexual Activity  . Alcohol use: No    Alcohol/week: 0.0 standard drinks  . Drug use: No  . Sexual activity: Not on file  Other Topics Concern  . Not on file  Social History Narrative  . Not on file   Social Determinants of Health   Financial Resource Strain:   . Difficulty of Paying Living Expenses:   Food Insecurity:   . Worried About Ship broker in the Last Year:   . Arboriculturist in the Last Year:   Transportation Needs:   . Film/video editor (Medical):   Marland Kitchen Lack of Transportation (Non-Medical):   Physical Activity:   . Days of Exercise per Week:   . Minutes of Exercise per Session:   Stress:   . Feeling of Stress :  Social Connections:   . Frequency of Communication with Friends and Family:   . Frequency of Social Gatherings with Friends and Family:   . Attends Religious Services:   . Active Member of Clubs or Organizations:   . Attends Archivist Meetings:   Marland Kitchen Marital Status:   Intimate Partner Violence:   . Fear of Current or Ex-Partner:   . Emotionally Abused:   Marland Kitchen Physically Abused:   . Sexually Abused:     Family History  Problem Relation Age of Onset  . COPD Sister   . Diabetes Sister   . Breast cancer Sister        Mastectomy  . CVA Mother 40  . Heart disease Mother   . Diabetes Mother 78  . Lung cancer Father        lung carcinoma  . Colon cancer Neg Hx     ROS- All systems are reviewed and negative except as per the HPI above  Physical Exam: Vitals:   11/25/19 1415  BP: (!) 170/70  Pulse: 72  Weight: 90 kg  Height: 5\' 8"  (1.727 m)   Wt Readings from Last 3 Encounters:  11/25/19 90 kg  11/18/19 88.8 kg  11/15/19 90.1 kg    Labs: Lab Results  Component Value Date   NA 141 11/25/2019   K 4.6 11/25/2019   CL 103 11/25/2019   CO2 25 11/25/2019   GLUCOSE 102 (H) 11/25/2019   BUN 15 11/25/2019   CREATININE 0.80 11/25/2019   CALCIUM 9.6 11/25/2019   PHOS 4.5 09/21/2019   MG 1.7 11/25/2019   Lab Results  Component Value Date   INR 1.2 11/16/2019   Lab Results  Component Value Date   CHOL 147 08/17/2014   HDL 51.30 08/17/2014   LDLCALC 79 08/17/2014   TRIG 84.0 08/17/2014     GEN- The patient is well appearing, alert and oriented x 3 today.   Head- normocephalic, atraumatic Eyes-  Sclera clear, conjunctiva pink Ears- hearing intact Oropharynx-  clear Neck- supple, no JVP Lymph- no cervical lymphadenopathy Lungs- Clear to ausculation bilaterally, normal work of breathing Heart regular rate and rhythm, no murmurs, rubs or gallops, PMI not laterally displaced GI- soft, NT, ND, + BS Extremities- no clubbing, cyanosis, or edema MS- no significant deformity or atrophy Skin- no rash or lesion Psych- euthymic mood, full affect Neuro- strength and sensation are intact  EKG- NSR at 61 bpm, pr itn 178 ms, qrs int 102 ms, qtc 485 ms   Assessment and Plan: 1. Persisitent afib Now back in SR s/p Tikosyn admit  Continue 250 mcg bid Precautions  reviewed Bmet/mag today   2. CHA2DS2VASc score of at least 4 No missed doses of eliquis 5 mg bid   3. HTN Elevated She states that her losartan was reduced a few months back after being on 100 mg losartan for years Will increase to 100 mg for BP of 170/70  She will watch her BP at home  F/u with Dr. [name] 12/22/19  Geroge Baseman. Milina Pagett, [facility] [address] [place], [place] [address] [phone]

## 2019-12-22 ENCOUNTER — Ambulatory Visit: Payer: Medicare Other | Admitting: Internal Medicine

## 2020-01-04 ENCOUNTER — Encounter: Payer: Self-pay | Admitting: Internal Medicine

## 2020-01-04 ENCOUNTER — Other Ambulatory Visit: Payer: Self-pay

## 2020-01-04 ENCOUNTER — Ambulatory Visit (INDEPENDENT_AMBULATORY_CARE_PROVIDER_SITE_OTHER): Payer: Medicare Other | Admitting: Internal Medicine

## 2020-01-04 VITALS — BP 128/80 | HR 97 | Ht 68.0 in | Wt 196.6 lb

## 2020-01-04 DIAGNOSIS — I10 Essential (primary) hypertension: Secondary | ICD-10-CM

## 2020-01-04 DIAGNOSIS — I4819 Other persistent atrial fibrillation: Secondary | ICD-10-CM | POA: Diagnosis not present

## 2020-01-04 NOTE — Patient Instructions (Addendum)
Medication Instructions:  Your physician recommends that you continue on your current medications as directed. Please refer to the Current Medication list given to you today.  Labwork: You will get lab work today:  BMP and magnesium  Testing/Procedures: None ordered.  Follow-Up: Your physician wants you to follow-up in: 6 months with Dr. [name].   You will receive a reminder letter in the mail two months in advance. If you don't receive a letter, please call our office to schedule the follow-up appointment.   Any Other Special Instructions Will Be Listed Below (If Applicable).  If you need a refill on your cardiac medications before your next appointment, please call your pharmacy.

## 2020-01-04 NOTE — Progress Notes (Signed)
HPI Debbie [name] returns today for followup of atrial fib. She is a pleasant 74 yo woman with a h/o atrial fib and underwent initiation of dofetilide over a month ago. In the interim, she has done reasonably well. She has no chest pain. Her dyspnea is improved. We had to reduce her dose of dofetilide from 500 to 250 due to QT prolongation.  Allergies  Allergen Reactions  . Meloxicam Other (See Comments)    Causes excess Fluid buildup     Current Outpatient Medications  Medication Sig Dispense Refill  . acetaminophen (TYLENOL) 500 MG tablet Take 500 mg by mouth every 6 (six) hours as needed for mild pain.    Marland Kitchen apixaban (ELIQUIS) 5 MG TABS tablet Take 1 tablet (5 mg total) by mouth 2 (two) times daily. 60 tablet 0  . BD PEN NEEDLE NANO U/F 32G X 4 MM MISC 2 (two) times daily. as directed  6  . Calcium Carb-Cholecalciferol (CALCIUM 1000 + D PO) Take 1,000 mg by mouth daily.    . Coenzyme Q10 300 MG CAPS Take 300 mg by mouth daily.    Marland Kitchen dexlansoprazole (DEXILANT) 60 MG capsule Take 1 capsule (60 mg total) by mouth daily.    Marland Kitchen diltiazem (CARDIZEM CD) 240 MG 24 hr capsule Take 1 capsule (240 mg total) by mouth daily. 30 capsule 0  . dofetilide (TIKOSYN) 250 MCG capsule Take 1 capsule (250 mcg total) by mouth 2 (two) times daily. 60 capsule 6  . ezetimibe (ZETIA) 10 MG tablet Take 10 mg by mouth every evening.     . fluticasone (FLONASE) 50 MCG/ACT nasal spray Place 1 spray into the nose daily as needed for allergies.     Marland Kitchen gabapentin (NEURONTIN) 100 MG capsule Take 100-200 mg by mouth See admin instructions. Take 200 mg in the morning and 100 mg at night    . halobetasol (ULTRAVATE) 0.05 % cream Apply 1 application topically 2 (two) times daily as needed (psoriasis).     . ibandronate (BONIVA) 150 MG tablet Take 150 mg by mouth every 30 (thirty) days.    Marland Kitchen ibuprofen (ADVIL,MOTRIN) 200 MG tablet Take 400 mg by mouth as needed for headache, mild pain or moderate pain.     . Insulin  Glargine (BASAGLAR KWIKPEN) 100 UNIT/ML SOPN Inject 35 Units into the skin daily.     . Insulin Pen Needle (BD PEN NEEDLE NANO U/F) 32G X 4 MM MISC USE TWICE DAILY AS DIRECTED    . ketotifen (ZADITOR) 0.025 % ophthalmic solution Place 1 drop into both eyes daily as needed (dry eyes).    Marland Kitchen lidocaine (LIDODERM) 5 % Place 1 patch onto the skin daily. Remove & Discard patch within 12 hours or as directed by MD    . losartan (COZAAR) 100 MG tablet Take 1 tablet (100 mg total) by mouth daily. 30 tablet 3  . magnesium oxide (MAG-OX) 400 (241.3 Mg) MG tablet Take 1 tablet (400 mg total) by mouth 2 (two) times daily. 60 tablet 6  . OXYGEN Place 3 L into the nose See admin instructions. 3 lpm with sleep and exertion  APS     . SitaGLIPtin-MetFORMIN HCl (JANUMET XR) 50-1000 MG TB24 Take 1 tablet by mouth 2 (two) times daily.     Marland Kitchen tiZANidine (ZANAFLEX) 4 MG tablet Take 4 mg by mouth at bedtime.    . VOLTAREN 1 % GEL Apply 2 g topically 4 (four) times daily as needed (pain).  2  . furosemide (LASIX) 40 MG tablet Take 0.5 tablets (20 mg total) by mouth daily as needed for fluid or edema. (Patient taking differently: Take 20 mg by mouth as needed for fluid or edema. ) 30 tablet 0   No current facility-administered medications for this visit.     Past Medical History:  Diagnosis Date  . A-fib ([place]) 11/15/2019  . Adenomatous polyp 12/04/2006  . Asthma   . COPD (chronic obstructive pulmonary disease) ([place])   . DM type 2 (diabetes mellitus, type 2) ([place])   . Hemorrhoid 12/04/2006  . Hyperlipidemia   . Hypertension   . Peripheral arterial disease ([place])   . Sepsis ([place]) 09/22/2019    ROS:   All systems reviewed and negative except as noted in the HPI.   Past Surgical History:  Procedure Laterality Date  . CARDIOVERSION N/A 10/18/2019   Procedure: CARDIOVERSION;  Surgeon: [name], MD;  Location: [facility] ENDOSCOPY;  Service: Cardiovascular;  Laterality: N/A;  . CHOLECYSTECTOMY    . COLONOSCOPY   12/03/2006   Dr. Delight Ovens, adenomatous polyp  . COLONOSCOPY  03/25/2012   Procedure: COLONOSCOPY;  Surgeon: Daneil Dolin, MD;  Location: [facility] ENDO SUITE;  Service: Endoscopy;  Laterality: N/A;  10:30  . ESOPHAGOGASTRODUODENOSCOPY  11/03/2002   Dr. Gala Romney- normal exam- was done to check for possible foreign body  . Fiberoptic bronchoscopy with endobronchial  ultrasound  10/22/2010   [place]  . Right BKA  1990  . RIGHT HEART CATHETERIZATION N/A 06/22/2014   Procedure: RIGHT HEART CATH;  Surgeon: Larey Dresser, MD;  Location[facility] CATH LAB;  Service: Cardiovascular;  Laterality: N/A;     Family History  Problem Relation Age of Onset  . COPD Sister   . Diabetes Sister   . Breast cancer Sister        Mastectomy  . CVA Mother 87  . Heart disease Mother   . Diabetes Mother 28  . Lung cancer Father        lung carcinoma  . Colon cancer Neg Hx      Social History   Socioeconomic History  . Marital status: Married    Spouse name: Kynsley Tarvin  . Number of children: 3  . Years of education: Not on file  . Highest education level: Not on file  Occupational History  . Occupation: retired    Fish farm manager: UNEMPLOYED  Tobacco Use  . Smoking status: Former Smoker    Packs/day: 0.50    Years: 18.00    Pack years: 9.00    Types: Cigarettes    Quit date: 09/16/1990    Years since quitting: 29.3  . Smokeless tobacco: Never Used  Substance and Sexual Activity  . Alcohol use: No    Alcohol/week: 0.0 standard drinks  . Drug use: No  . Sexual activity: Not on file  Other Topics Concern  . Not on file  Social History Narrative  . Not on file   Social Determinants of Health   Financial Resource Strain:   . Difficulty of Paying Living Expenses:   Food Insecurity:   . Worried About Charity fundraiser in the Last Year:   . Arboriculturist in the Last Year:   Transportation Needs:   . Film/video editor (Medical):   Marland Kitchen Lack of Transportation (Non-Medical):   Physical Activity:    . Days of Exercise per Week:   . Minutes of Exercise per Session:   Stress:   . Feeling of  Stress :   Social Connections:   . Frequency of Communication with Friends and Family:   . Frequency of Social Gatherings with Friends and Family:   . Attends Religious Services:   . Active Member of Clubs or Organizations:   . Attends Archivist Meetings:   Marland Kitchen Marital Status:   Intimate Partner Violence:   . Fear of Current or Ex-Partner:   . Emotionally Abused:   Marland Kitchen Physically Abused:   . Sexually Abused:      BP 128/80   Pulse 97   Ht 5\' 8"  (1.727 m)   Wt 196 lb 9.6 oz (89.2 kg)   SpO2 90%   BMI 29.89 kg/m   Physical Exam:  Overweight 74 yo woman, wearing oxygen,  NAD HEENT: Unremarkable Neck:  6 cm JVD, no thyromegally Lymphatics:  No adenopathy Back:  No CVA tenderness Lungs:  Clear with no wheezes HEART:  Regular rate rhythm, no murmurs, no rubs, no clicks Abd:  soft, positive bowel sounds, no organomegally, no rebound, no guarding Ext:  2 plus pulses, no edema, no cyanosis, no clubbing Skin:  No rashes no nodules Neuro:  CN II through XII intact, motor grossly intact  EKG - nsr with frequent and consecutive PAC's and PVC's   Assess/Plan: 1. Persistent atrial fib - she appears to be maintaining NSR. She does have a significant amount of ectopy but feels better.  2. HTN - her bp is controlled.  3. Pulmonary HTN - this is affecting her breathing. She will continue oxygen.   Mikle Bosworth.D.

## 2020-01-05 LAB — BASIC METABOLIC PANEL
BUN/Creatinine Ratio: 14 (ref 12–28)
BUN: 11 mg/dL (ref 8–27)
CO2: 26 mmol/L (ref 20–29)
Calcium: 10 mg/dL (ref 8.7–10.3)
Chloride: 103 mmol/L (ref 96–106)
Creatinine, Ser: 0.76 mg/dL (ref 0.57–1.00)
GFR calc Af Amer: 89 mL/min/{1.73_m2} (ref >59)
GFR calc non Af Amer: 78 mL/min/{1.73_m2} (ref >59)
Glucose: 195 mg/dL — ABNORMAL HIGH (ref 65–99)
Potassium: 4.4 mmol/L (ref 3.5–5.2)
Sodium: 142 mmol/L (ref 134–144)

## 2020-01-05 LAB — MAGNESIUM: Magnesium: 1.6 mg/dL (ref 1.6–2.3)

## 2020-02-17 ENCOUNTER — Other Ambulatory Visit: Payer: Self-pay | Admitting: Otolaryngology

## 2020-02-17 DIAGNOSIS — R221 Localized swelling, mass and lump, neck: Secondary | ICD-10-CM

## 2020-03-03 ENCOUNTER — Other Ambulatory Visit: Payer: Self-pay

## 2020-03-03 ENCOUNTER — Ambulatory Visit
Admission: RE | Admit: 2020-03-03 | Discharge: 2020-03-03 | Disposition: A | Discharge status: Home / Self Care | Payer: Medicare Other | Source: Ambulatory Visit | Attending: Otolaryngology | Admitting: Otolaryngology

## 2020-03-03 DIAGNOSIS — R221 Localized swelling, mass and lump, neck: Secondary | ICD-10-CM

## 2020-03-03 MED ORDER — IOPAMIDOL (ISOVUE-300) INJECTION 61%
75.0000 mL | Freq: Once | INTRAVENOUS | Status: AC | PRN
  Administered 2020-03-03: 75 mL via INTRAVENOUS

## 2020-04-13 ENCOUNTER — Other Ambulatory Visit: Payer: Self-pay

## 2020-04-13 ENCOUNTER — Ambulatory Visit (HOSPITAL_COMMUNITY)
Admission: RE | Admit: 2020-04-13 | Discharge: 2020-04-13 | Disposition: A | Discharge status: Home / Self Care | Payer: Medicare Other | Source: Ambulatory Visit | Attending: Nurse Practitioner | Admitting: Nurse Practitioner

## 2020-04-13 ENCOUNTER — Encounter (HOSPITAL_COMMUNITY): Payer: Self-pay | Admitting: Nurse Practitioner

## 2020-04-13 VITALS — BP 180/64 | HR 72 | Ht 68.0 in | Wt 198.4 lb

## 2020-04-13 DIAGNOSIS — Z836 Family history of other diseases of the respiratory system: Secondary | ICD-10-CM | POA: Diagnosis not present

## 2020-04-13 DIAGNOSIS — Z7901 Long term (current) use of anticoagulants: Secondary | ICD-10-CM | POA: Insufficient documentation

## 2020-04-13 DIAGNOSIS — Z560 Unemployment, unspecified: Secondary | ICD-10-CM | POA: Diagnosis not present

## 2020-04-13 DIAGNOSIS — E785 Hyperlipidemia, unspecified: Secondary | ICD-10-CM | POA: Insufficient documentation

## 2020-04-13 DIAGNOSIS — I4819 Other persistent atrial fibrillation: Secondary | ICD-10-CM | POA: Insufficient documentation

## 2020-04-13 DIAGNOSIS — Z833 Family history of diabetes mellitus: Secondary | ICD-10-CM | POA: Insufficient documentation

## 2020-04-13 DIAGNOSIS — E1151 Type 2 diabetes mellitus with diabetic peripheral angiopathy without gangrene: Secondary | ICD-10-CM | POA: Insufficient documentation

## 2020-04-13 DIAGNOSIS — Z8249 Family history of ischemic heart disease and other diseases of the circulatory system: Secondary | ICD-10-CM | POA: Insufficient documentation

## 2020-04-13 DIAGNOSIS — Z794 Long term (current) use of insulin: Secondary | ICD-10-CM | POA: Diagnosis not present

## 2020-04-13 DIAGNOSIS — J449 Chronic obstructive pulmonary disease, unspecified: Secondary | ICD-10-CM | POA: Insufficient documentation

## 2020-04-13 DIAGNOSIS — Z87891 Personal history of nicotine dependence: Secondary | ICD-10-CM | POA: Insufficient documentation

## 2020-04-13 DIAGNOSIS — Z79899 Other long term (current) drug therapy: Secondary | ICD-10-CM | POA: Insufficient documentation

## 2020-04-13 DIAGNOSIS — I10 Essential (primary) hypertension: Secondary | ICD-10-CM | POA: Insufficient documentation

## 2020-04-13 DIAGNOSIS — Z8719 Personal history of other diseases of the digestive system: Secondary | ICD-10-CM | POA: Diagnosis not present

## 2020-04-13 DIAGNOSIS — D6869 Other thrombophilia: Secondary | ICD-10-CM | POA: Diagnosis not present

## 2020-04-13 LAB — BASIC METABOLIC PANEL
Anion gap: 11 (ref 5–15)
BUN: 15 mg/dL (ref 8–23)
CO2: 23 mmol/L (ref 22–32)
Calcium: 9.7 mg/dL (ref 8.9–10.3)
Chloride: 106 mmol/L (ref 98–111)
Creatinine, Ser: 0.7 mg/dL (ref 0.44–1.00)
GFR calc Af Amer: >60 mL/min (ref >60)
GFR calc non Af Amer: >60 mL/min (ref >60)
Glucose, Bld: 159 mg/dL — ABNORMAL HIGH (ref 70–99)
Potassium: 5.2 mmol/L — ABNORMAL HIGH (ref 3.5–5.1)
Sodium: 140 mmol/L (ref 135–145)

## 2020-04-13 LAB — MAGNESIUM: Magnesium: 1.7 mg/dL (ref 1.7–2.4)

## 2020-04-13 NOTE — Progress Notes (Signed)
Primary Care Physician: Debbie Noon, MD Referring Physician: Dr. [name] is a 74 y.o. female with a h/o persistent afib that is in the afib clinic today for tikosyn admit per Dr. Lovena [name]. She states no missed doses of eliquis for at least 3 weeks. No benadryl use. She has been taking magnesium for low magnesium in the last couple of weeks. Drugs have been screened by PharmD and no qt prolonging drugs on board.Wears O2 at 3l/min.  F/u in afib clinic,11/25/19, one week, after tikosyn admit. She is in SR and she feels much improved. qtc stable at 485 ms.She is taking tikosyn on a regular basis.   F/u in the afib clinic, 7/29, for surveillance of Tikosyn. She reports that she is staying in [place]. Qtc is stable. No complaints.   Today, she denies symptoms of palpitations, chest pain, shortness of breath, orthopnea, PND, lower extremity edema, dizziness, presyncope, syncope, or neurologic sequela. The patient is tolerating medications without difficulties and is otherwise without complaint today.   Past Medical History:  Diagnosis Date  . A-fib ([place]) 11/15/2019  . Adenomatous polyp 12/04/2006  . Asthma   . COPD (chronic obstructive pulmonary disease) ([place])   . DM type 2 (diabetes mellitus, type 2) ([place])   . Hemorrhoid 12/04/2006  . Hyperlipidemia   . Hypertension   . Peripheral arterial disease ([place])   . Sepsis ([place]) 09/22/2019   Past Surgical History:  Procedure Laterality Date  . CARDIOVERSION N/A 10/18/2019   Procedure: CARDIOVERSION;  Surgeon: [name], MD;  Location: [facility] ENDOSCOPY;  Service: Cardiovascular;  Laterality: N/A;  . CHOLECYSTECTOMY    . COLONOSCOPY  12/03/2006   Dr. Delight Ovens, adenomatous polyp  . COLONOSCOPY  03/25/2012   Procedure: COLONOSCOPY;  Surgeon: Daneil Dolin, MD;  Location: [facility] ENDO SUITE;  Service: Endoscopy;  Laterality: N/A;  10:30  . ESOPHAGOGASTRODUODENOSCOPY  11/03/2002   Dr. Gala Romney- normal exam- was done to check for  possible foreign body  . Fiberoptic bronchoscopy with endobronchial  ultrasound  10/22/2010   [place]  . Right BKA  1990  . RIGHT HEART CATHETERIZATION N/A 06/22/2014   Procedure: RIGHT HEART CATH;  Surgeon: Larey Dresser, MD;  Location[facility] CATH LAB;  Service: Cardiovascular;  Laterality: N/A;    Current Outpatient Medications  Medication Sig Dispense Refill  . acetaminophen (TYLENOL) 500 MG tablet Take 500 mg by mouth as needed for mild pain.     Marland Kitchen apixaban (ELIQUIS) 5 MG TABS tablet Take 1 tablet (5 mg total) by mouth 2 (two) times daily. 60 tablet 0  . BD PEN NEEDLE NANO U/F 32G X 4 MM MISC 2 (two) times daily. as directed  6  . Calcium Carb-Cholecalciferol (CALCIUM 1000 + D PO) Take 1,000 mg by mouth daily.    . Coenzyme Q10 400 MG CAPS Take 400 mg by mouth daily.     Marland Kitchen dexlansoprazole (DEXILANT) 60 MG capsule Take 1 capsule (60 mg total) by mouth daily.    Marland Kitchen diltiazem (CARDIZEM CD) 240 MG 24 hr capsule Take 1 capsule (240 mg total) by mouth daily. 30 capsule 0  . dofetilide (TIKOSYN) 250 MCG capsule Take 1 capsule (250 mcg total) by mouth 2 (two) times daily. 60 capsule 6  . ezetimibe (ZETIA) 10 MG tablet Take 10 mg by mouth every evening.     . fluticasone (FLONASE) 50 MCG/ACT nasal spray Place 1 spray into the nose daily as needed for allergies.     Marland Kitchen  furosemide (LASIX) 40 MG tablet Take 0.5 tablets (20 mg total) by mouth daily as needed for fluid or edema. (Patient taking differently: Take 20 mg by mouth as needed for fluid or edema. ) 30 tablet 0  . gabapentin (NEURONTIN) 100 MG capsule Take 100-200 mg by mouth See admin instructions. Take 200 mg in the morning and 100 mg at night    . halobetasol (ULTRAVATE) 0.05 % cream Apply 1 application topically 2 (two) times daily as needed (psoriasis).     . ibandronate (BONIVA) 150 MG tablet Take 150 mg by mouth every 30 (thirty) days.    Marland Kitchen ibuprofen (ADVIL,MOTRIN) 200 MG tablet Take 400 mg by mouth as needed for headache, mild pain or  moderate pain.     . Insulin Glargine (BASAGLAR KWIKPEN) 100 UNIT/ML SOPN Inject 45 Units into the skin daily.     . Insulin Pen Needle (BD PEN NEEDLE NANO U/F) 32G X 4 MM MISC USE TWICE DAILY AS DIRECTED    . ketotifen (ZADITOR) 0.025 % ophthalmic solution Place 1 drop into both eyes daily as needed (dry eyes).    Marland Kitchen lidocaine (LIDODERM) 5 % Place 1 patch onto the skin as needed. Remove & Discard patch within 12 hours or as directed by MD     . losartan (COZAAR) 100 MG tablet Take 1 tablet (100 mg total) by mouth daily. 30 tablet 3  . magnesium oxide (MAG-OX) 400 (241.3 Mg) MG tablet Take 1 tablet (400 mg total) by mouth 2 (two) times daily. 60 tablet 6  . OXYGEN Place 3 L into the nose See admin instructions. 3 lpm with sleep and exertion  APS     . SitaGLIPtin-MetFORMIN HCl (JANUMET XR) 50-1000 MG TB24 Take 1 tablet by mouth 2 (two) times daily.     Marland Kitchen tiZANidine (ZANAFLEX) 4 MG tablet Take 4 mg by mouth at bedtime.    . VOLTAREN 1 % GEL Apply 2 g topically 4 (four) times daily as needed (pain).   2   No current facility-administered medications for this encounter.    Allergies  Allergen Reactions  . Meloxicam Other (See Comments)    Causes excess Fluid buildup    Social History   Socioeconomic History  . Marital status: Married    Spouse name: Debbie [name]  . Number of children: 3  . Years of education: Not on file  . Highest education level: Not on file  Occupational History  . Occupation: retired    Fish farm manager: UNEMPLOYED  Tobacco Use  . Smoking status: Former Smoker    Packs/day: 0.50    Years: 18.00    Pack years: 9.00    Types: Cigarettes    Quit date: 09/16/1990    Years since quitting: 29.5  . Smokeless tobacco: Never Used  Substance and Sexual Activity  . Alcohol use: No    Alcohol/week: 0.0 standard drinks  . Drug use: No  . Sexual activity: Not on file  Other Topics Concern  . Not on file  Social History Narrative  . Not on file   Social Determinants of  Health   Financial Resource Strain:   . Difficulty of Paying Living Expenses:   Food Insecurity:   . Worried About Charity fundraiser in the Last Year:   . Arboriculturist in the Last Year:   Transportation Needs:   . Film/video editor (Medical):   Marland Kitchen Lack of Transportation (Non-Medical):   Physical Activity:   . Days of  Exercise per Week:   . Minutes of Exercise per Session:   Stress:   . Feeling of Stress :   Social Connections:   . Frequency of Communication with Friends and Family:   . Frequency of Social Gatherings with Friends and Family:   . Attends Religious Services:   . Active Member of Clubs or Organizations:   . Attends Archivist Meetings:   Marland Kitchen Marital Status:   Intimate Partner Violence:   . Fear of Current or Ex-Partner:   . Emotionally Abused:   Marland Kitchen Physically Abused:   . Sexually Abused:     Family History  Problem Relation Age of Onset  . COPD Sister   . Diabetes Sister   . Breast cancer Sister        Mastectomy  . CVA Mother 22  . Heart disease Mother   . Diabetes Mother 59  . Lung cancer Father        lung carcinoma  . Colon cancer Neg Hx     ROS- All systems are reviewed and negative except as per the HPI above  Physical Exam: Vitals:   04/13/20 0902  Weight: 90 kg  Height: 5\' 8"  (1.727 m)   Wt Readings from Last 3 Encounters:  04/13/20 90 kg  01/04/20 89.2 kg  11/25/19 90 kg    Labs: Lab Results  Component Value Date   NA 142 01/04/2020   K 4.4 01/04/2020   CL 103 01/04/2020   CO2 26 01/04/2020   GLUCOSE 195 (H) 01/04/2020   BUN 11 01/04/2020   CREATININE 0.76 01/04/2020   CALCIUM 10.0 01/04/2020   PHOS 4.5 09/21/2019   MG 1.6 01/04/2020   Lab Results  Component Value Date   INR 1.2 11/16/2019   Lab Results  Component Value Date   CHOL 147 08/17/2014   HDL 51.30 08/17/2014   LDLCALC 79 08/17/2014   TRIG 84.0 08/17/2014     GEN- The patient is well appearing, alert and oriented x 3 today.   Head-  normocephalic, atraumatic Eyes-  Sclera clear, conjunctiva pink Ears- hearing intact Oropharynx- clear Neck- supple, no JVP Lymph- no cervical lymphadenopathy Lungs- Clear to ausculation bilaterally, normal work of breathing Heart regular rate and rhythm, no murmurs, rubs or gallops, PMI not laterally displaced GI- soft, NT, ND, + BS Extremities- no clubbing, cyanosis, or edema MS- no significant deformity or atrophy Skin- no rash or lesion Psych- euthymic mood, full affect Neuro- strength and sensation are intact  EKG- NSR at  72 bpm, pr int 190 ms, qrs int 90 ms, qtc 481 ms   Assessment and Plan: 1. Persisitent afib Now back in SR s/p Tikosyn admit  Continue 250 mcg bid Precautions  reviewed Bmet/mag today   2. [id number] score of at least 4 No missed doses of eliquis 5 mg bid   3. HTN Elevated in office today  Better at home  She will continue to monitor at home  F/u with Dr. Lovena [name]  In October   Samuella Rasool C. Demitris Pokorny, [facility] [address] [place], [place] [address] [phone]

## 2020-05-24 ENCOUNTER — Other Ambulatory Visit (HOSPITAL_COMMUNITY): Payer: Self-pay | Admitting: Nurse Practitioner

## 2020-06-10 ENCOUNTER — Other Ambulatory Visit: Payer: Self-pay | Admitting: Physician Assistant

## 2020-06-29 ENCOUNTER — Other Ambulatory Visit: Payer: Self-pay

## 2020-06-29 ENCOUNTER — Ambulatory Visit (INDEPENDENT_AMBULATORY_CARE_PROVIDER_SITE_OTHER): Payer: Medicare Other | Admitting: Internal Medicine

## 2020-06-29 ENCOUNTER — Ambulatory Visit (INDEPENDENT_AMBULATORY_CARE_PROVIDER_SITE_OTHER): Payer: Medicare Other

## 2020-06-29 ENCOUNTER — Encounter: Payer: Self-pay | Admitting: Internal Medicine

## 2020-06-29 VITALS — BP 162/80 | HR 84 | Temp 97.2°F | Ht 68.0 in | Wt 195.0 lb

## 2020-06-29 DIAGNOSIS — J9611 Chronic respiratory failure with hypoxia: Secondary | ICD-10-CM

## 2020-06-29 DIAGNOSIS — Z23 Encounter for immunization: Secondary | ICD-10-CM

## 2020-06-29 NOTE — Patient Instructions (Signed)
Make sure you check your oxygen saturations at highest level of activity to be sure it stays over 90% and adjust  02 flow upward to maintain this level if needed but remember to turn it back to previous settings when you stop (to conserve your supply).   Please remember to go to the  x-ray department  for your tests - we will call you with the results when they are available    Please schedule a follow up visit in 3 months but call sooner if needed

## 2020-06-29 NOTE — Progress Notes (Signed)
Subjective:     Patient ID: Debbie [name], female   DOB: [birth year]     MRN: [id number]    Brief patient profile:  35   yowf quit smoking 1990 with ? pna in 2011/2012  [place] all acute changes resolved p rx for pna but ever since has required 02 at hs/ and with activity and followed previously by Dr [name] for chronic resp failure but no airflow obst on spirometry    History of Present Illness  09/29/2015 1st  office visit/ Debbie [name]  Transition of care  Chief Complaint  Patient presents with  . Follow-up    Former Dr [name] pt. Breathing is unchanged. She does notice it gets worse with colder weather.   sob bending over / struggles to walk eg HT due to R leg prosthesis and doe  So has used scooter x years rec No change in recommendations for 02  Try to keep your weight trending down if at all possible to help your lower lobes get better airflow  Please schedule a follow up visit in 12 months but call sooner if needed    04/04/17 Compared to a prior study in 2015,   there is now moderate LVH and the LVEF is higher at 65-70%. No   obvious PFO noted by saline microbubble contrast. There is   moderate TR with an RVSP of 56 mmHg and a normal, collapsing IVC.   Consistent with moderate pulmonary hypertension.    03/24/2018  f/u ov/Debbie [name] re:  Chronic resp failure ? Cor pulmonale related to obesity s hypoventilation  Chief Complaint  Patient presents with  . Follow-up     on 3L with exertion and at night, increased bp, stopped losartan, never recieved her portable tank, increased niffedical makes her legs burn when it was increases, face feels hot, stayed on 30 mg   Dyspnea:  3lpm [employer] only = MMRC3 = can't walk 100 yards even at a slow pace at a flat grade s stopping due to sob   Cough: sensation of drainage/ f/u by ENT Gso rec gi eval [place] Sleeping: on either side/ feels nauseated all her life if lies on back   02: 3lpm 24/7  rec Wear 3lpm 24/7 > we will refer for  POC  We sill schedule overnight 02 sats on 3 lpm to make sure that's adequate       06/25/2018  f/u ov/Debbie [name] re: chronic  resp failure s/p ALI from ? Pna 2012 > chronic 02 dep/ obesity/ Chief Complaint  Patient presents with  . Follow-up    PFT done today. Her breathing is unchangd since the last visit.   Dyspnea:  50 ft on 3lpm  Cough daytime only :throat tickle >  Referred to GI by ent but hasn't gone yet / on dexilant already  Sleeping: bed flat/ one pillow/ hurts in back and gets nauseated - fine on sides  SABA use: none  02: 3lpm at hs and with activity  rec Monitor your blood pressure and your 02 levels and let us know if trending down on 3lpm  Keep up the exercising as much as you can  Please schedule a follow up visit in 12  months but call sooner if needed    06/29/2019  f/u ov/Debbie [name] re:   resp failure s/p ali/  L ant/lat cp x years post always sitting /waxes and wanes over sev min to an hour then resolves s pleuritic or ex component/ never with ex  or supine Chief Complaint  Patient presents with  . Follow-up    Breathing has been worse since Dec 2019 when she had a respiratory infection.   Dyspnea:  50 ft 02 ok only 3lpm  Cough: no Sleeping: fine on side / bed is flat  SABA use: no albuterol 02: 3lpm hs and with activity and stays around 94%  rec Classic pain pattern typical of gas pain:    Treatment consists of avoiding foods that cause gas  citrucel 1 heaping tsp twice daily with a large glass of water > problem     06/29/2020  f/u ov/Debbie [name] re: chronic resp failure/ cp attributed to ibs last ov has 100% resolved Chief Complaint  Patient presents with  . Follow-up    Breathing has been more labored x 3 months. She is wearing her o2 more often.    Dyspnea: baseline walks into store and back out  - uses [employer] parking though  rides scooter in all stores  Cough: none  Sleeping: no problem bed in flat one pillow SABA use: none 02: 3lpm 24/7   For the last 4-5 months  has notice can't get to br and back s sob since sepsis and afib but not checking sats at peak ex   No obvious day to day or daytime variability or assoc excess/ purulent sputum or mucus plugs or hemoptysis or cp or chest tightness, subjective wheeze or overt sinus or hb symptoms.   Sleeping  without nocturnal  or early am exacerbation  of respiratory  c/o's or need for noct saba. Also denies any obvious fluctuation of symptoms with weather or environmental changes or other aggravating or alleviating factors except as outlined above   No unusual exposure hx or h/o childhood pna/ asthma or knowledge of premature birth.  Current Allergies, Complete Past Medical History, Past Surgical History, Family History, and Social History were reviewed in [employer] record.  ROS  The following are not active complaints unless bolded Hoarseness, sore throat, dysphagia, dental problems, itching, sneezing,  nasal congestion or discharge of excess mucus or purulent secretions, ear ache,   fever, chills, sweats, unintended wt loss or wt gain, classically pleuritic or exertional cp,  orthopnea pnd or arm/hand swelling  or leg swelling, presyncope, palpitations, abdominal pain, anorexia, nausea, vomiting, diarrhea  or change in bowel habits or change in bladder habits, change in stools or change in urine, dysuria, hematuria,  rash, arthralgias, visual complaints, headache, numbness, weakness or ataxia or problems with walking s/p remote R bka with stump pain or coordination,  change in mood or  memory.        Current Meds  Medication Sig  . acetaminophen (TYLENOL) 500 MG tablet Take 500 mg by mouth as needed for mild pain.   Marland Kitchen apixaban (ELIQUIS) 5 MG TABS tablet Take 1 tablet (5 mg total) by mouth 2 (two) times daily.  . BD PEN NEEDLE NANO U/F 32G X 4 MM MISC 2 (two) times daily. as directed  . Calcium Carb-Cholecalciferol (CALCIUM 1000 + D PO) Take 1,000 mg by mouth daily.  . Coenzyme Q10 400  MG CAPS Take 400 mg by mouth daily.   Marland Kitchen dexlansoprazole (DEXILANT) 60 MG capsule Take 1 capsule (60 mg total) by mouth daily.  Marland Kitchen diltiazem (CARDIZEM CD) 240 MG 24 hr capsule Take 1 capsule (240 mg total) by mouth daily.  Marland Kitchen dofetilide (TIKOSYN) 250 MCG capsule TAKE 1 CAPSULE BY MOUTH 2 TIMES DAILY  . ezetimibe (ZETIA) 10  MG tablet Take 10 mg by mouth every evening.   . fluticasone (FLONASE) 50 MCG/ACT nasal spray Place 1 spray into the nose daily as needed for allergies.   Marland Kitchen gabapentin (NEURONTIN) 100 MG capsule Take 100-200 mg by mouth See admin instructions. Take 200 mg in the morning and 100 mg at night  . halobetasol (ULTRAVATE) 0.05 % cream Apply 1 application topically 2 (two) times daily as needed (psoriasis).   . ibandronate (BONIVA) 150 MG tablet Take 150 mg by mouth every 30 (thirty) days.  Marland Kitchen ibuprofen (ADVIL,MOTRIN) 200 MG tablet Take 400 mg by mouth as needed for headache, mild pain or moderate pain.   . Insulin Glargine (BASAGLAR KWIKPEN) 100 UNIT/ML SOPN Inject 45 Units into the skin daily.   . Insulin Pen Needle (BD PEN NEEDLE NANO U/F) 32G X 4 MM MISC USE TWICE DAILY AS DIRECTED  . ketotifen (ZADITOR) 0.025 % ophthalmic solution Place 1 drop into both eyes daily as needed (dry eyes).  Marland Kitchen lidocaine (LIDODERM) 5 % Place 1 patch onto the skin as needed. Remove & Discard patch within 12 hours or as directed by MD   . losartan (COZAAR) 100 MG tablet TAKE 1 TABLET BY MOUTH EVERY DAY  . magnesium oxide (MAG-OX) 400 (241.3 Mg) MG tablet Take 1 tablet (400 mg total) by mouth 2 (two) times daily.  . OXYGEN Place 3 L into the nose See admin instructions. 3 lpm with sleep and exertion  APS   . SitaGLIPtin-MetFORMIN HCl (JANUMET XR) 50-1000 MG TB24 Take 1 tablet by mouth 2 (two) times daily.   Marland Kitchen tiZANidine (ZANAFLEX) 4 MG tablet Take 4 mg by mouth at bedtime.  . VOLTAREN 1 % GEL Apply 2 g topically 4 (four) times daily as needed (pain).            Objective:   Physical Exam    amb obese  wf nad    06/29/2020    195 06/29/2019    208  06/25/2018    204  03/24/2018        201 03/24/2017         211  09/30/2016       216   09/29/15 221 lb 6.4 oz (100.426 kg)  08/16/15 214 lb (97.07 kg)  05/17/15 216 lb 8 oz (98.204 kg)      Vital signs reviewed  06/29/2020  - Note at rest 02 sats  96% on 3lpm POC     HEENT : pt wearing mask not removed for exam due to covid -19 concerns.    NECK :  without JVD/Nodes/TM/ nl carotid upstrokes bilaterally   LUNGS: no acc muscle use,  Nl contour chest which is clear to A and P bilaterally without cough on insp or exp maneuvers   CV:  slt irreg   no s3 or murmur or increase in P2, and no edema   ABD:  soft and nontender with nl inspiratory excursion in the supine position. No bruits or organomegaly appreciated, bowel sounds nl  MS:    ext warm without deformities, calf tenderness, cyanosis or clubbing  prosthesis R BKA     SKIN: warm and dry without lesions    NEURO:  alert, approp, nl sensorium with  no motor or cerebellar deficits apparent.      CXR PA and Lateral:   06/29/2020 :    I personally reviewed images and agree with radiology impression as follows:    No active cardiopulmonary disease.  Assessment:

## 2020-07-01 ENCOUNTER — Encounter: Payer: Self-pay | Admitting: Internal Medicine

## 2020-07-01 NOTE — Assessment & Plan Note (Signed)
S/p pna ? ali / 2012 > on 02 ever since hs  ONO RA 03/2014:  Desat to 72% Ambulatory ox 2015:  desat with walking CT chest 04/2014 with no PE, no ISLD, mild basilar atx from obesity. PFTs 04/25/14   VC 2.82 (87%) no airflow obst/ ERV 42% and dlco 50% > corrects to 60% for alv vol V/Q 07/2014:  No chronic TE disease Echo with DD, unable to estimate PA pressures, but nothing overt suggestive of clinically significant pulmonary htn. [place] 06/2014:  PA 43/13, mean PA 25, PCWP 10, PVR 2 WU, negative shunt run.  - 09/29/2015   Walked 3lpm   2 laps @ 185 ft each stopped due to sob / slow pace no desats  - 03/24/2017 Patient Saturations on Room Air at Rest = 88%--increased to 96%3lpm pulsed o2  - Echo 04/04/17 - Technically difficult study. Compared to a prior study in 2015, there is now moderate LVH and the LVEF is higher at 65-70%.  No obvious PFO noted by saline microbubble contrast. There is moderate TR with an RVSP of 56 mmHg and a normal, collapsing IVC. Consistent with moderate pulmonary hypertension. - ono RA  05/12/17  desat x 2: 55 min - 05/27/2017  rec 3lpm hs and repeat > ? Done ?> repeat  03/24/2018 >>>  - HC03   02/06/18  = 22  - 03/24/2018   Walked 3lpm Pulsed x one lap @ 185 stopped due to  Sob/ nl pace, sats 91% at end > referred for POC  - ONO 03/25/18 ? Done on RA desat x 380 min < 89%  So   03/31/2018 >>>  rec 3lpm and repeat > done 04/17/18 with desat x 30min on 3lpm > no change rx  - PFT's  06/25/2018  FEV1 2.13 (95 % ) ratio 74  p 7 % improvement from saba p nothing prior to study with DLCO  46 % corrects to 55  % for alv volume  And ERV 4%  - 06/29/2019   Walked 3lpm  x one lap =  approx 250 ft - stopped due to  Back pain min  sob with sats 92% at slow pace   Decreased ex tol since sepsis  / afib but cxr clear as is exam with PE unlikely on DOAC so nothing new to offer here but encourage to be as active as possible, continue efforts to lose wt  Advised: Make sure you check your oxygen  saturations at highest level of activity to be sure it stays over 90% and adjust  02 flow upward to maintain this level if needed but remember to turn it back to previous settings when you stop (to conserve your supply).

## 2020-07-01 NOTE — Assessment & Plan Note (Signed)
Complicated by HBP/ Hyperlipidemia/ DM with ERV =4% on pfts 06/25/2018   Body mass index is 29.65 kg/m.  -  trending down, encourage to keep up the good work Lab Results  Component Value Date   TSH 1.157 09/20/2019     Contributing to gerd risk/ doe/reviewed the need and the process to achieve and maintain neg calorie balance > defer f/u primary care including intermittently monitoring thyroid status           Each maintenance medication was reviewed in detail including emphasizing most importantly the difference between maintenance and prns and under what circumstances the prns are to be triggered using an action plan format where appropriate.  Total time for H and P, chart review, counseling, teaching device and generating customized AVS unique to this office visit / charting = 20 min

## 2020-07-04 ENCOUNTER — Other Ambulatory Visit (HOSPITAL_COMMUNITY): Payer: Self-pay | Admitting: *Deleted

## 2020-07-04 ENCOUNTER — Ambulatory Visit (HOSPITAL_COMMUNITY)
Admission: RE | Admit: 2020-07-04 | Discharge: 2020-07-04 | Disposition: A | Discharge status: Home / Self Care | Payer: Medicare Other | Source: Ambulatory Visit | Attending: Nurse Practitioner | Admitting: Nurse Practitioner

## 2020-07-04 ENCOUNTER — Other Ambulatory Visit: Payer: Self-pay

## 2020-07-04 ENCOUNTER — Encounter (HOSPITAL_COMMUNITY): Payer: Self-pay | Admitting: Nurse Practitioner

## 2020-07-04 VITALS — BP 144/88 | HR 64 | Ht 68.0 in | Wt 198.6 lb

## 2020-07-04 DIAGNOSIS — E1151 Type 2 diabetes mellitus with diabetic peripheral angiopathy without gangrene: Secondary | ICD-10-CM | POA: Diagnosis not present

## 2020-07-04 DIAGNOSIS — Z794 Long term (current) use of insulin: Secondary | ICD-10-CM | POA: Diagnosis not present

## 2020-07-04 DIAGNOSIS — D6869 Other thrombophilia: Secondary | ICD-10-CM | POA: Diagnosis not present

## 2020-07-04 DIAGNOSIS — J449 Chronic obstructive pulmonary disease, unspecified: Secondary | ICD-10-CM | POA: Insufficient documentation

## 2020-07-04 DIAGNOSIS — Z87891 Personal history of nicotine dependence: Secondary | ICD-10-CM | POA: Diagnosis not present

## 2020-07-04 DIAGNOSIS — Z8249 Family history of ischemic heart disease and other diseases of the circulatory system: Secondary | ICD-10-CM | POA: Diagnosis not present

## 2020-07-04 DIAGNOSIS — Z79899 Other long term (current) drug therapy: Secondary | ICD-10-CM | POA: Diagnosis not present

## 2020-07-04 DIAGNOSIS — Z7901 Long term (current) use of anticoagulants: Secondary | ICD-10-CM | POA: Diagnosis not present

## 2020-07-04 DIAGNOSIS — Z888 Allergy status to other drugs, medicaments and biological substances status: Secondary | ICD-10-CM | POA: Insufficient documentation

## 2020-07-04 DIAGNOSIS — I10 Essential (primary) hypertension: Secondary | ICD-10-CM | POA: Diagnosis not present

## 2020-07-04 DIAGNOSIS — Z833 Family history of diabetes mellitus: Secondary | ICD-10-CM | POA: Insufficient documentation

## 2020-07-04 DIAGNOSIS — I4819 Other persistent atrial fibrillation: Secondary | ICD-10-CM

## 2020-07-04 DIAGNOSIS — E785 Hyperlipidemia, unspecified: Secondary | ICD-10-CM | POA: Diagnosis not present

## 2020-07-04 LAB — BASIC METABOLIC PANEL
Anion gap: 11 (ref 5–15)
BUN: 11 mg/dL (ref 8–23)
CO2: 24 mmol/L (ref 22–32)
Calcium: 9.4 mg/dL (ref 8.9–10.3)
Chloride: 107 mmol/L (ref 98–111)
Creatinine, Ser: 0.74 mg/dL (ref 0.44–1.00)
GFR, Estimated: >60 mL/min (ref >60)
Glucose, Bld: 111 mg/dL — ABNORMAL HIGH (ref 70–99)
Potassium: 3.9 mmol/L (ref 3.5–5.1)
Sodium: 142 mmol/L (ref 135–145)

## 2020-07-04 LAB — MAGNESIUM: Magnesium: 1.6 mg/dL — ABNORMAL LOW (ref 1.7–2.4)

## 2020-07-04 NOTE — Progress Notes (Signed)
Primary Care Physician: Debbie Noon, MD Referring Physician: Dr. [name] is a 74 y.o. female with a h/o persistent afib that is in the afib clinic today for tikosyn admit per Dr. Lovena [name]. She states no missed doses of eliquis for at least 3 weeks. No benadryl use. She has been taking magnesium for low magnesium in the last couple of weeks. Drugs have been screened by PharmD and no qt prolonging drugs on board.Wears O2 at 3l/min.  F/u in afib clinic,11/25/19, one week, after tikosyn admit. She is in SR and she feels much improved. qtc stable at 485 ms.She is taking tikosyn on a regular basis.   F/u in the afib clinic, 7/29, for surveillance of Tikosyn. She reports that she is staying in [place]. Qtc is stable. No complaints.   F/u in afib clinic, 07/05/20. Pt asked to be seen as she missed her Tikosyn one day last week, was seen by Dr. Melvyn [name] a day or two later and he remarked that he heard a skip. She  was nervous she was in afib. Her  EKG shows SR with one PAC here today. Otherwise no change in her condition.   Today, she denies symptoms of palpitations, chest pain, shortness of breath, orthopnea, PND, lower extremity edema, dizziness, presyncope, syncope, or neurologic sequela. The patient is tolerating medications without difficulties and is otherwise without complaint today.   Past Medical History:  Diagnosis Date  . A-fib ([place]) 11/15/2019  . Adenomatous polyp 12/04/2006  . Asthma   . COPD (chronic obstructive pulmonary disease) ([place])   . DM type 2 (diabetes mellitus, type 2) ([place])   . Hemorrhoid 12/04/2006  . Hyperlipidemia   . Hypertension   . Peripheral arterial disease ([place])   . Sepsis ([place]) 09/22/2019   Past Surgical History:  Procedure Laterality Date  . CARDIOVERSION N/A 10/18/2019   Procedure: CARDIOVERSION;  Surgeon: Debbie Hector, MD;  Location: [facility] ENDOSCOPY;  Service: Cardiovascular;  Laterality: N/A;  . CHOLECYSTECTOMY    . COLONOSCOPY  12/03/2006    Dr. Delight [name], adenomatous polyp  . COLONOSCOPY  03/25/2012   Procedure: COLONOSCOPY;  Surgeon: Debbie Dolin, MD;  Location: [facility] ENDO SUITE;  Service: Endoscopy;  Laterality: N/A;  10:30  . ESOPHAGOGASTRODUODENOSCOPY  11/03/2002   Dr. Gala [name]- normal exam- was done to check for possible foreign body  . Fiberoptic bronchoscopy with endobronchial  ultrasound  10/22/2010   Debbie [name]  . Right BKA  1990  . RIGHT HEART CATHETERIZATION N/A 06/22/2014   Procedure: RIGHT HEART CATH;  Surgeon: Debbie Dresser, MD;  Location: [facility] CATH LAB;  Service: Cardiovascular;  Laterality: N/A;    Current Outpatient Medications  Medication Sig Dispense Refill  . acetaminophen (TYLENOL) 500 MG tablet Take 500 mg by mouth as needed for mild pain.     Marland Kitchen apixaban (ELIQUIS) 5 MG TABS tablet Take 1 tablet (5 mg total) by mouth 2 (two) times daily. 60 tablet 0  . BD PEN NEEDLE NANO U/F 32G X 4 MM MISC 2 (two) times daily. as directed  6  . Calcium Carb-Cholecalciferol (CALCIUM 1000 + D PO) Take 1,000 mg by mouth daily.    . Coenzyme Q10 400 MG CAPS Take 400 mg by mouth daily.     Marland Kitchen dexlansoprazole (DEXILANT) 60 MG capsule Take 1 capsule (60 mg total) by mouth daily.    Marland Kitchen diltiazem (CARDIZEM CD) 240 MG 24 hr capsule Take 1 capsule (240 mg total) by mouth daily. [place]  capsule 0  . dofetilide (TIKOSYN) 250 MCG capsule TAKE 1 CAPSULE BY MOUTH 2 TIMES DAILY 180 capsule 1  . ezetimibe (ZETIA) 10 MG tablet Take 10 mg by mouth every evening.     . fluticasone (FLONASE) 50 MCG/ACT nasal spray Place 1 spray into the nose daily as needed for allergies.     . furosemide (LASIX) 40 MG tablet Take 0.5 tablets (20 mg total) by mouth daily as needed for fluid or edema. (Patient taking differently: Take 20 mg by mouth as needed for fluid or edema. ) 30 tablet 0  . gabapentin (NEURONTIN) 100 MG capsule Take 100-200 mg by mouth See admin instructions. Take 200 mg in the morning and 100 mg at night    . halobetasol (ULTRAVATE) 0.05 % cream  Apply 1 application topically 2 (two) times daily as needed (psoriasis).     . ibandronate (BONIVA) 150 MG tablet Take 150 mg by mouth every 30 (thirty) days.    Marland Kitchen ibuprofen (ADVIL,MOTRIN) 200 MG tablet Take 400 mg by mouth as needed for headache, mild pain or moderate pain.     . Insulin Glargine (BASAGLAR KWIKPEN) 100 UNIT/ML SOPN Inject 45 Units into the skin daily.     . Insulin Pen Needle (BD PEN NEEDLE NANO U/F) 32G X 4 MM MISC USE TWICE DAILY AS DIRECTED    . ketotifen (ZADITOR) 0.025 % ophthalmic solution Place 1 drop into both eyes daily as needed (dry eyes).    Marland Kitchen lidocaine (LIDODERM) 5 % Place 1 patch onto the skin as needed. Remove & Discard patch within 12 hours or as directed by MD     . losartan (COZAAR) 100 MG tablet TAKE 1 TABLET BY MOUTH EVERY DAY 90 tablet 1  . magnesium oxide (MAG-OX) 400 (241.3 Mg) MG tablet Take 1 tablet (400 mg total) by mouth 2 (two) times daily. 60 tablet 6  . OXYGEN Place 3 L into the nose See admin instructions. 3 lpm with sleep and exertion  APS     . SitaGLIPtin-MetFORMIN HCl (JANUMET XR) 50-1000 MG TB24 Take 1 tablet by mouth 2 (two) times daily.     Marland Kitchen tiZANidine (ZANAFLEX) 4 MG tablet Take 4 mg by mouth at bedtime.    . VOLTAREN 1 % GEL Apply 2 g topically 4 (four) times daily as needed (pain).   2   No current facility-administered medications for this encounter.    Allergies  Allergen Reactions  . Meloxicam Other (See Comments)    Causes excess Fluid buildup    Social History   Socioeconomic History  . Marital status: Married    Spouse name: Debbie [name]  . Number of children: 3  . Years of education: Not on file  . Highest education level: Not on file  Occupational History  . Occupation: retired    Fish farm manager: UNEMPLOYED  Tobacco Use  . Smoking status: Former Smoker    Packs/day: 0.50    Years: 18.00    Pack years: 9.00    Types: Cigarettes    Quit date: 09/16/1990    Years since quitting: 29.8  . Smokeless tobacco: Never Used   Substance and Sexual Activity  . Alcohol use: No    Alcohol/week: 0.0 standard drinks  . Drug use: No  . Sexual activity: Not on file  Other Topics Concern  . Not on file  Social History Narrative  . Not on file   Social Determinants of Health   Financial Resource Strain:   . Difficulty  of Paying Living Expenses: Not on file  Food Insecurity:   . Worried About Charity fundraiser in the Last Year: Not on file  . Ran Out of Food in the Last Year: Not on file  Transportation Needs:   . Lack of Transportation (Medical): Not on file  . Lack of Transportation (Non-Medical): Not on file  Physical Activity:   . Days of Exercise per Week: Not on file  . Minutes of Exercise per Session: Not on file  Stress:   . Feeling of Stress : Not on file  Social Connections:   . Frequency of Communication with Friends and Family: Not on file  . Frequency of Social Gatherings with Friends and Family: Not on file  . Attends Religious Services: Not on file  . Active Member of Clubs or Organizations: Not on file  . Attends Archivist Meetings: Not on file  . Marital Status: Not on file  Intimate Partner Violence:   . Fear of Current or Ex-Partner: Not on file  . Emotionally Abused: Not on file  . Physically Abused: Not on file  . Sexually Abused: Not on file    Family History  Problem Relation Age of Onset  . COPD Sister   . Diabetes Sister   . Breast cancer Sister        Mastectomy  . CVA Mother 20  . Heart disease Mother   . Diabetes Mother 67  . Lung cancer Father        lung carcinoma  . Colon cancer Neg Hx     ROS- All systems are reviewed and negative except as per the HPI above  Physical Exam: Vitals:   07/04/20 0953  BP: (!) 144/88  Pulse: 64  Weight: 90.1 kg  Height: 5\' 8"  (1.727 m)   Wt Readings from Last 3 Encounters:  07/04/20 90.1 kg  06/29/20 88.5 kg  04/13/20 90 kg    Labs: Lab Results  Component Value Date   NA 140 04/13/2020   K 5.2 (H)  04/13/2020   CL 106 04/13/2020   CO2 23 04/13/2020   GLUCOSE 159 (H) 04/13/2020   BUN 15 04/13/2020   CREATININE 0.70 04/13/2020   CALCIUM 9.7 04/13/2020   PHOS 4.5 09/21/2019   MG 1.7 04/13/2020   Lab Results  Component Value Date   INR 1.2 11/16/2019   Lab Results  Component Value Date   CHOL 147 08/17/2014   HDL 51.30 08/17/2014   LDLCALC 79 08/17/2014   TRIG 84.0 08/17/2014     GEN- The patient is well appearing, alert and oriented x 3 today.   Head- normocephalic, atraumatic Eyes-  Sclera clear, conjunctiva pink Ears- hearing intact Oropharynx- clear Neck- supple, no JVP Lymph- no cervical lymphadenopathy Lungs- Clear to ausculation bilaterally, normal work of breathing Heart regular rate and rhythm, no murmurs, rubs or gallops, PMI not laterally displaced GI- soft, NT, ND, + BS Extremities- no clubbing, cyanosis, or edema MS- no significant deformity or atrophy Skin- no rash or lesion Psych- euthymic mood, full affect Neuro- strength and sensation are intact  EKG- NSR  with one PAC, 64 bpm, pr int 168 ms, qrs int 90 ms, qtc 484 ms   Assessment and Plan: 1. Persisitent afib Maintaining SR s/p Tikosyn load, 11/18/19 Qt stable  Continue 250 mcg Tikosyn  bid Bmet/mag today   2. CHA2DS2VASc score of at least 4 Continue eliquis 5 mg bid   3. HTN Stable   States did not  receive recall letter for Dr. Lovena [name] in October, will request f/u in late November   Debbie [name] C. Avner Stroder, [facility] [address] [place], [place] [address] [phone]

## 2020-09-11 ENCOUNTER — Other Ambulatory Visit: Payer: Self-pay | Admitting: Internal Medicine

## 2020-09-11 ENCOUNTER — Other Ambulatory Visit (HOSPITAL_COMMUNITY): Payer: Self-pay | Admitting: Nurse Practitioner

## 2020-09-29 ENCOUNTER — Ambulatory Visit: Payer: Medicare Other | Admitting: Internal Medicine

## 2020-10-17 ENCOUNTER — Ambulatory Visit (INDEPENDENT_AMBULATORY_CARE_PROVIDER_SITE_OTHER): Payer: Medicare Other | Admitting: Internal Medicine

## 2020-10-17 ENCOUNTER — Encounter: Payer: Self-pay | Admitting: Internal Medicine

## 2020-10-17 ENCOUNTER — Other Ambulatory Visit: Payer: Self-pay

## 2020-10-17 DIAGNOSIS — J9611 Chronic respiratory failure with hypoxia: Secondary | ICD-10-CM

## 2020-10-17 NOTE — Assessment & Plan Note (Signed)
S/p pna ? ali / 2012 > on 02 ever since hs  ONO RA 03/2014:  Desat to 72% Ambulatory ox 2015:  desat with walking CT chest 04/2014 with no PE, no ISLD, mild basilar atx from obesity. PFTs 04/25/14   VC 2.82 (87%) no airflow obst/ ERV 42% and dlco 50% > corrects to 60% for alv vol V/Q 07/2014:  No chronic TE disease Echo with DD, unable to estimate PA pressures, but nothing overt suggestive of clinically significant pulmonary htn. [place] 06/2014:  PA 43/13, mean PA 25, PCWP 10, PVR 2 WU, negative shunt run.  - 09/29/2015   Walked 3lpm   2 laps @ 185 ft each stopped due to sob / slow pace no desats  - 03/24/2017 Patient Saturations on Room Air at Rest = 88%--increased to 96%3lpm pulsed o2  - Echo 04/04/17 - Technically difficult study. Compared to a prior study in 2015, there is now moderate LVH and the LVEF is higher at 65-70%.  No obvious PFO noted by saline microbubble contrast. There is moderate TR with an RVSP of 56 mmHg and a normal, collapsing IVC. Consistent with moderate pulmonary hypertension. - ono RA  05/12/17  desat x 2: 55 min - 05/27/2017  rec 3lpm hs and repeat > ? Done ?> repeat  03/24/2018 >>>  - HC03   02/06/18  = 22  - 03/24/2018   Walked 3lpm Pulsed x one lap @ 185 stopped due to  Sob/ nl pace, sats 91% at end > referred for POC  - ONO 03/25/18 ? Done on RA desat x 380 min < 89%  So   03/31/2018 >>>  rec 3lpm and repeat > done 04/17/18 with desat x 51min on 3lpm > no change rx  - PFT's  06/25/2018  FEV1 2.13 (95 % ) ratio 74  p 7 % improvement from saba p nothing prior to study with DLCO  46 % corrects to 55  % for alv volume  And ERV 4%  - 06/29/2019   Walked 3lpm  x one lap =  approx 250 ft - stopped due to  Back pain min  sob with sats 92% at slow pace    Complicated by WHO III PH with main rx = adequate 02 and f/u by cards planned   F/u here can resume back to default = each Oct to assess 02 needs

## 2020-10-17 NOTE — Progress Notes (Signed)
Subjective:     Patient ID: Debbie [name], female   DOB: [birth year]     MRN: [id number]    Brief patient profile:  35   yowf quit smoking 1990 with ? pna in 2011/2012  [place] all acute changes resolved p rx for pna but ever since has required 02 at hs/ and with activity and followed previously by Dr [name] for chronic resp failure but no airflow obst on spirometry    History of Present Illness  09/29/2015 1st  office visit/ Debbie [name]  Transition of care  Chief Complaint  Patient presents with  . Follow-up    Former Dr [name] pt. Breathing is unchanged. She does notice it gets worse with colder weather.   sob bending over / struggles to walk eg HT due to R leg prosthesis and doe  So has used scooter x years rec No change in recommendations for 02  Try to keep your weight trending down if at all possible to help your lower lobes get better airflow  Please schedule a follow up visit in 12 months but call sooner if needed    04/04/17 Compared to a prior study in 2015,   there is now moderate LVH and the LVEF is higher at 65-70%. No   obvious PFO noted by saline microbubble contrast. There is   moderate TR with an RVSP of 56 mmHg and a normal, collapsing IVC.   Consistent with moderate pulmonary hypertension.    03/24/2018  f/u ov/Debbie [name] re:  Chronic resp failure ? Cor pulmonale related to obesity s hypoventilation  Chief Complaint  Patient presents with  . Follow-up     on 3L with exertion and at night, increased bp, stopped losartan, never recieved her portable tank, increased niffedical makes her legs burn when it was increases, face feels hot, stayed on 30 mg   Dyspnea:  3lpm [employer] only = MMRC3 = can't walk 100 yards even at a slow pace at a flat grade s stopping due to sob   Cough: sensation of drainage/ f/u by ENT [facility] rec gi eval [place] Sleeping: on either side/ feels nauseated all her life if lies on back   02: 3lpm 24/7  rec Wear 3lpm 24/7 > we will refer for  POC  We sill schedule overnight 02 sats on 3 lpm to make sure that's adequate       06/25/2018  f/u ov/Debbie [name] re: chronic  resp failure s/p ALI from ? Pna 2012 > chronic 02 dep/ obesity/ Chief Complaint  Patient presents with  . Follow-up    PFT done today. Her breathing is unchangd since the last visit.   Dyspnea:  50 ft on 3lpm  Cough daytime only :throat tickle >  Referred to GI by ent but hasn't gone yet / on dexilant already  Sleeping: bed flat/ one pillow/ hurts in back and gets nauseated - fine on sides  SABA use: none  02: 3lpm at hs and with activity  rec Monitor your blood pressure and your 02 levels and let us know if trending down on 3lpm  Keep up the exercising as much as you can  Please schedule a follow up visit in 12  months but call sooner if needed    06/29/2019  f/u ov/Debbie [name] re:   resp failure s/p ali/  L ant/lat cp x years post always sitting /waxes and wanes over sev min to an hour then resolves s pleuritic or ex component/ never with ex  or supine Chief Complaint  Patient presents with  . Follow-up    Breathing has been worse since Dec 2019 when she had a respiratory infection.   Dyspnea:  50 ft 02 ok only 3lpm  Cough: no Sleeping: fine on side / bed is flat  SABA use: no albuterol 02: 3lpm hs and with activity and stays around 94%  rec Classic pain pattern typical of gas pain:    Treatment consists of avoiding foods that cause gas  citrucel 1 heaping tsp twice daily with a large glass of water > problem     06/29/2020  f/u ov/Debbie [name] re: chronic resp failure/ cp attributed to ibs last ov has 100% resolved Chief Complaint  Patient presents with  . Follow-up    Breathing has been more labored x 3 months. She is wearing her o2 more often.    Dyspnea: baseline walks into store and back out  - uses [employer] parking though  rides scooter in all stores  Cough: none  Sleeping: no problem bed in flat one pillow SABA use: none 02: 3lpm 24/7   For the last 4-5 months  has noticed can't get to br and back s sob since sepsis and afib but not checking sats at peak ex rec Make sure you check your oxygen saturations at highest level of activity  Check chest x-ray  No active cardiopulmonary disease.   10/17/2020  f/u ov/Debbie [name] re:  Chronic 02 dep resp failure s/p ali/ pna Chief Complaint  Patient presents with  . Follow-up    Breathing is doing well. She is using her o2 3lpm 24/7- checks sats with exertion at times and it reads above 90%3lpm pulsed.   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   Sleeping as above without nocturnal  or early am exacerbation  of respiratory  c/o's or need for noct saba. Also denies any obvious fluctuation of symptoms with weather or environmental changes or other aggravating or alleviating factors except as outlined above   No unusual exposure hx or h/o childhood pna/ asthma or knowledge of premature birth.  Current Allergies, Complete Past Medical History, Past Surgical History, Family History, and Social History were reviewed in [employer] record.  ROS  The following are not active complaints unless bolded Hoarseness, sore throat, dysphagia, dental problems, itching, sneezing,  nasal congestion or discharge of excess mucus or purulent secretions, ear ache,   fever, chills, sweats, unintended wt loss or wt gain, classically pleuritic or exertional cp,  orthopnea pnd or arm/hand swelling  or leg swelling, presyncope, palpitations, abdominal pain, anorexia, nausea, vomiting, diarrhea  or change in bowel habits or change in bladder habits, change in stools or change in urine,  dysuria, hematuria,  rash, arthralgias, visual complaints, headache, numbness, weakness or ataxia or problems with walking or coordination,  change in mood or  memory.        Current Meds  Medication Sig  . acetaminophen (TYLENOL) 500 MG tablet Take 500 mg by mouth as needed for mild pain.   Marland Kitchen apixaban (ELIQUIS) 5 MG TABS tablet Take  1 tablet (5 mg total) by mouth 2 (two) times daily.  . BD PEN NEEDLE NANO U/F 32G X 4 MM MISC 2 (two) times daily. as directed  . Calcium Carb-Cholecalciferol (CALCIUM 1000 + D PO) Take 1,000 mg by mouth daily.  . Coenzyme Q10 400 MG CAPS Take 400 mg by mouth daily.   Marland Kitchen dexlansoprazole (DEXILANT) 60 MG capsule Take 1 capsule (60 mg total) by mouth daily.  Marland Kitchen diltiazem (CARDIZEM CD) 240 MG 24 hr capsule Take 1 capsule (240 mg total) by mouth daily.  Marland Kitchen dofetilide (TIKOSYN) 250 MCG capsule TAKE 1 CAPSULE BY MOUTH TWICE A DAY  . ezetimibe (ZETIA) 10 MG tablet Take 10 mg by mouth every evening.   . fluticasone (FLONASE) 50 MCG/ACT nasal spray Place 1 spray into the nose daily as needed for allergies.   . furosemide (LASIX) 40 MG tablet Take 0.5 tablets (20 mg total) by mouth daily as needed for fluid or edema. (Patient taking differently: Take 20 mg by mouth as needed for fluid or edema.)  . gabapentin (NEURONTIN) 100 MG capsule Take 100-200 mg by mouth See admin instructions. Take 200 mg in the morning and 100 mg at night  . halobetasol (ULTRAVATE) 0.05 % cream Apply 1 application topically 2 (two) times daily as needed (psoriasis).   . ibandronate (BONIVA) 150 MG tablet Take 150 mg by mouth every 30 (thirty) days.  Marland Kitchen ibuprofen (ADVIL,MOTRIN) 200 MG tablet Take 400 mg by mouth as needed for headache, mild pain or moderate pain.   . Insulin Glargine (BASAGLAR KWIKPEN) 100 UNIT/ML SOPN Inject 45 Units into the skin daily.   . Insulin Pen Needle 32G X 4 MM MISC USE TWICE DAILY AS DIRECTED  . ketotifen (ZADITOR) 0.025 % ophthalmic solution Place 1 drop into both eyes daily  as needed (dry eyes).  Marland Kitchen lidocaine (LIDODERM) 5 % Place 1 patch onto the skin as needed. Remove & Discard patch within 12 hours or as directed by MD  . losartan (COZAAR) 100 MG tablet TAKE 1 TABLET BY MOUTH EVERY DAY  . magnesium oxide (MAG-OX) 400 (241.3 Mg) MG tablet Take 1 tablet (400 mg total) by mouth 2 (two) times daily.  . OXYGEN Place 3 L into the nose See admin instructions. 3 lpm with sleep and exertion  APS  . SitaGLIPtin-MetFORMIN HCl 50-1000 MG TB24 Take 1 tablet by mouth 2 (two) times daily.  Marland Kitchen tiZANidine (ZANAFLEX) 4 MG tablet Take 4 mg by mouth at bedtime.  . VOLTAREN 1 % GEL Apply 2 g topically 4 (four) times daily as needed (pain).                           Objective:   Physical Exam       10/17/2020       200 06/29/2020    195 06/29/2019    208  06/25/2018    204  03/24/2018        201 03/24/2017         211  09/30/2016       216   09/29/15 221 lb 6.4 oz (100.426 kg)  08/16/15 214 lb (97.07 kg)  05/17/15 216 lb 8 oz (98.204 kg)    Vital signs reviewed  10/17/2020  - Note at rest 02 sats  94% on 3lpm POC  General appearance:    Pleasant wf nad    HEENT : pt wearing mask not removed for exam due to covid -  19 concerns.    NECK :  without JVD/Nodes/TM/ nl carotid upstrokes bilaterally   LUNGS: no acc muscle use,  Nl contour chest slightly decreased bs bilaterally without cough on insp or exp maneuvers   CV:  RRR  no s3 or murmur  With slt  increase in P2, and no edema   ABD:  Obese soft and nontender with nl inspiratory excursion in the supine position. No bruits or organomegaly appreciated, bowel sounds nl  MS:  Walks with cane/ ext warm with  R bka/prosthesis  calf tenderness, cyanosis or clubbing No obvious joint restrictions   SKIN: warm and dry without lesions    NEURO:  alert, approp, nl sensorium with  no motor or cerebellar deficits apparent.               Assessment:

## 2020-10-17 NOTE — Assessment & Plan Note (Signed)
Complicated by HBP/ Hyperlipidemia/ DM with ERV =4% on pfts 06/25/2018   Body mass index is 30.53 kg/m.  -  trending  Up  Lab Results  Component Value Date   TSH 1.157 09/20/2019     Contributing to gerd risk/ doe/reviewed the need and the process to achieve and maintain neg calorie balance (this will be difficult given 02 and stump limitations in terms of maximizing cal output)  > defer f/u primary care including intermittently monitoring thyroid status           Each maintenance medication was reviewed in detail including emphasizing most importantly the difference between maintenance and prns and under what circumstances the prns are to be triggered using an action plan format where appropriate.  Total time for H and P, chart review, counseling, reviewing  and generating customized AVS unique to this office visit / same day charting = 28 min

## 2020-10-17 NOTE — Patient Instructions (Signed)
Make sure you check your oxygen saturation  at your highest level of activity  to be sure it stays over 90% and adjust  02 flow upward to maintain this level if needed but remember to turn it back to previous settings when you stop (to conserve your supply).   Follow up in Oct 2022

## 2020-10-18 ENCOUNTER — Other Ambulatory Visit: Payer: Self-pay | Admitting: Physician Assistant

## 2020-10-18 MED ORDER — MAGNESIUM OXIDE 400 (241.3 MG) MG PO TABS: 400.0000 mg | ORAL_TABLET | Freq: Two times a day (BID) | ORAL | 2 refills | Status: AC

## 2020-10-18 NOTE — Addendum Note (Signed)
Addended by: Carter Kitten D on: 10/18/2020 04:38 PM   Modules accepted: Orders

## 2020-12-19 ENCOUNTER — Other Ambulatory Visit: Payer: Self-pay

## 2020-12-19 ENCOUNTER — Ambulatory Visit (INDEPENDENT_AMBULATORY_CARE_PROVIDER_SITE_OTHER): Payer: Medicare Other | Admitting: Internal Medicine

## 2020-12-19 ENCOUNTER — Encounter: Payer: Self-pay | Admitting: Internal Medicine

## 2020-12-19 VITALS — BP 189/76 | HR 90 | Temp 96.9°F | Ht 68.0 in | Wt 203.8 lb

## 2020-12-19 DIAGNOSIS — R197 Diarrhea, unspecified: Secondary | ICD-10-CM | POA: Diagnosis not present

## 2020-12-19 NOTE — Patient Instructions (Signed)
Use Imodium 2 mg up to 6 times a day after loose stools  C. difficile toxin assay on stool sample  If C. difficile negative, will likely pursue a drug holiday off of Metformin.  Need to consider magnesium as a potential contributing factor to diarrhea as well.  Continue Dexilant 60 mg daily for GERD  Further recommendations to follow.

## 2020-12-19 NOTE — Progress Notes (Signed)
Primary Care Physician:  [name], MD Primary Gastroenterologist:  Dr. Gala Romney  Pre-Procedure History & Physical: HPI:  Debbie [name] is a 75 y.o. female here for evaluation of diarrhea.  Patient states she has had quite a difficult time over the last 1 to 2 years with recurrent UTIs, urosepsis, hospitalization.  The last 2 years have been a "blur".  In addition, new onset atrial fibrillation-status post ablation.  Now on Eliquis.  History of ill-defined chronic pulmonary disease for which he uses an oxygen concentrator.  Has limited pulmonary capacity.  Ambulates with a cane.  History of cor pulmonale. Notes diarrhea over the past several months to a year.  5-6 watery nonbloody bowel movements daily.  Postprandial component.  Does not ever have a formed stool. When she has articularly severe episodes of diarrhea she feels he has a protruding hemorrhoid.  Difficult to perform hygiene and it comes out.  She can reduce it. She has had major antibiotic exposure.  No C. difficile testing.  Of note, she is also been on Metformin and more recently has been on oral magnesium preparation.  History of multiple colonic polyps-adenomas removed over time. Overdue for surveillance colonoscopy by 5 years. Dexilant taken daily continues to control her reflux symptoms very well.  No dysphagia.  Past Medical History:  Diagnosis Date  . A-fib ([place]) 11/15/2019  . Adenomatous polyp 12/04/2006  . Asthma   . COPD (chronic obstructive pulmonary disease) ([place])   . DM type 2 (diabetes mellitus, type 2) ([place])   . Hemorrhoid 12/04/2006  . Hyperlipidemia   . Hypertension   . Peripheral arterial disease ([place])   . Sepsis ([place]) 09/22/2019    Past Surgical History:  Procedure Laterality Date  . CARDIOVERSION N/A 10/18/2019   Procedure: CARDIOVERSION;  Surgeon: [name], MD;  Location[facility] ENDOSCOPY;  Service: Cardiovascular;  Laterality: N/A;  . CHOLECYSTECTOMY    . COLONOSCOPY  12/03/2006   Dr.  Delight Ovens, adenomatous polyp  . COLONOSCOPY  03/25/2012   Procedure: COLONOSCOPY;  Surgeon: Daneil Dolin, MD;  Location: [facility] ENDO SUITE;  Service: Endoscopy;  Laterality: N/A;  10:30  . ESOPHAGOGASTRODUODENOSCOPY  11/03/2002   Dr. Gala Romney- normal exam- was done to check for possible foreign body  . Fiberoptic bronchoscopy with endobronchial  ultrasound  10/22/2010   [place]  . Right BKA  1990  . RIGHT HEART CATHETERIZATION N/A 06/22/2014   Procedure: RIGHT HEART CATH;  Surgeon: Larey Dresser, MD;  Location: [facility] CATH LAB;  Service: Cardiovascular;  Laterality: N/A;    Prior to Admission medications   Medication Sig Start Date End Date Taking? Authorizing Provider  acetaminophen (TYLENOL) 500 MG tablet Take 500 mg by mouth as needed for mild pain.    Yes [provider]  apixaban (ELIQUIS) 5 MG TABS tablet Take 1 tablet (5 mg total) by mouth 2 (two) times daily. 09/24/19  Yes Florencia Reasons, MD  BD PEN NEEDLE NANO U/F 32G X 4 MM MISC 2 (two) times daily. as directed 02/26/15  Yes [provider]  Calcium Carb-Cholecalciferol (CALCIUM 1000 + D PO) Take 1,000 mg by mouth daily.   Yes [provider]  Coenzyme Q10 400 MG CAPS Take 400 mg by mouth daily.    Yes [provider]  dexlansoprazole (DEXILANT) 60 MG capsule Take 1 capsule (60 mg total) by mouth daily. 09/29/15  Yes Tanda Rockers, MD  diltiazem (CARDIZEM CD) 240 MG 24 hr capsule Take 1 capsule (240  mg total) by mouth daily. 09/25/19  Yes Florencia Reasons, MD  dofetilide [facility]) 250 MCG capsule TAKE 1 CAPSULE BY MOUTH TWICE A DAY 09/12/20  Yes Evans Lance, MD  ezetimibe (ZETIA) 10 MG tablet Take 10 mg by mouth every evening.  08/27/19  Yes [provider]  fluticasone (FLONASE) 50 MCG/ACT nasal spray Place 1 spray into the nose daily as needed for allergies.    Yes [provider]  furosemide (LASIX) 40 MG tablet Take 0.5 tablets (20 mg total) by mouth daily as needed for fluid or edema. Patient  taking differently: Take 20 mg by mouth as needed for fluid or edema. 09/24/19 07/04/20 Yes Florencia Reasons, MD  gabapentin (NEURONTIN) 100 MG capsule Take 100-200 mg by mouth See admin instructions. Take 200 mg in the morning and 100 mg at night   Yes [provider]  halobetasol (ULTRAVATE) 0.05 % cream Apply 1 application topically 2 (two) times daily as needed (psoriasis).  08/15/16  Yes [provider]  ibandronate (BONIVA) 150 MG tablet Take 150 mg by mouth every 30 (thirty) days. 02/15/20  Yes [provider]  ibuprofen (ADVIL,MOTRIN) 200 MG tablet Take 400 mg by mouth as needed for headache, mild pain or moderate pain.    Yes [provider]  Insulin Glargine (BASAGLAR KWIKPEN) 100 UNIT/ML SOPN Inject 45 Units into the skin daily.    Yes [provider]  Insulin Pen Needle 32G X 4 MM MISC USE TWICE DAILY AS DIRECTED 05/31/15  Yes [provider]  ketotifen (ZADITOR) 0.025 % ophthalmic solution Place 1 drop into both eyes daily as needed (dry eyes).   Yes [provider]  lidocaine (LIDODERM) 5 % Place 1 patch onto the skin as needed. Remove & Discard patch within 12 hours or as directed by MD   Yes [provider]  Lidocaine-Glycerin (PREPARATION H EX) Apply topically as needed.   Yes [provider]  losartan (COZAAR) 100 MG tablet TAKE 1 TABLET BY MOUTH EVERY DAY 09/12/20  Yes Sherran Needs, NP  magnesium oxide (MAG-OX) 400 (241.3 Mg) MG tablet Take 1 tablet (400 mg total) by mouth 2 (two) times daily. Please make yearly appt with Dr. [name] for April 2022 for future refills. Thank you 1st attempt 10/18/20  Yes Evans Lance, MD  OXYGEN Place 3 L into the nose See admin instructions. 3 lpm with sleep and exertion  APS   Yes [provider]  SitaGLIPtin-MetFORMIN HCl 50-1000 MG TB24 Take 1 tablet by mouth 2 (two) times daily. 01/10/14  Yes [provider]  tamsulosin (FLOMAX) 0.4 MG CAPS capsule Take 0.4  mg by mouth daily. 12/08/20  Yes [provider]  tiZANidine (ZANAFLEX) 4 MG tablet Take 4 mg by mouth at bedtime. 11/04/19  Yes [provider]  VOLTAREN 1 % GEL Apply 2 g topically 4 (four) times daily as needed (pain).  08/13/17  Yes [provider]    Allergies as of 12/19/2020 - Review Complete 12/19/2020  Allergen Reaction Noted  . Meloxicam Other (See Comments) 10/18/2010    Family History  Problem Relation Age of Onset  . COPD Sister   . Diabetes Sister   . Breast cancer Sister        Mastectomy  . CVA Mother 23  . Heart disease Mother   . Diabetes Mother 6  . Lung cancer Father        lung carcinoma  . Colon cancer Neg Hx  Social History   Socioeconomic History  . Marital status: Married    Spouse name: Clista Rainford  . Number of children: 3  . Years of education: Not on file  . Highest education level: Not on file  Occupational History  . Occupation: retired    Fish farm manager: UNEMPLOYED  Tobacco Use  . Smoking status: Former Smoker    Packs/day: 0.50    Years: 18.00    Pack years: 9.00    Types: Cigarettes    Quit date: 09/16/1990    Years since quitting: 30.2  . Smokeless tobacco: Never Used  Substance and Sexual Activity  . Alcohol use: No    Alcohol/week: 0.0 standard drinks  . Drug use: No  . Sexual activity: Not on file  Other Topics Concern  . Not on file  Social History Narrative  . Not on file   Social Determinants of Health   Financial Resource Strain: Not on file  Food Insecurity: Not on file  Transportation Needs: Not on file  Physical Activity: Not on file  Stress: Not on file  Social Connections: Not on file  Intimate Partner Violence: Not on file    Review of Systems: See HPI, otherwise negative ROS  Physical Exam: BP (!) 189/76   Pulse 90   Temp (!) 96.9 F (36.1 C) (Temporal)   Ht 5\' 8"  (1.727 m)   Wt 203 lb 12.8 oz (92.4 kg)   BMI 30.99 kg/m  General:   Alert,  pleasant and cooperative in  NAD.  Nasal cannula in place. Eyes:  Sclera clear, no icterus.   Conjunctiva pink. Lungs:  Clear throughout to auscultation.   No wheezes, crackles, or rhonchi. No acute distress. Heart:  Regular rate and rhythm; no murmurs, clicks, rubs,  or gallops. Abdomen: Non-distended, normal bowel sounds.  Soft and nontender without appreciable mass or hepatosplenomegaly.  Pulses:  Normal pulses noted. Extremities: Status post right BKA with prosthetic limb. Rectal: No external lesions.  Good sphincter tone.  No mass in rectal vault.  Scant brown stool is Hemoccult negative.   Impression/Plan: 75 year old lady with multiple comorbidities presents with complaint of chronic diarrhea over a period of several months if not over the past year. Differential includes C. difficile with antibiotic exposure, medication effect (Metformin and/or magnesium).  Occult microscopic colitis is not excluded at this time. .  Recommendations:   Use Imodium 2 mg up to 6 times a day after loose stools  C. difficile toxin assay on stool sample  If C. difficile negative, will likely pursue a drug holiday off of Metformin.  Need to consider magnesium as a potential contributing factor to diarrhea as well.  Continue Dexilant 60 mg daily for GERD  Further recommendations to follow.    Notice: This dictation was prepared with Dragon dictation along with smaller phrase technology. Any transcriptional errors that result from this process are unintentional and may not be corrected upon review.

## 2021-01-05 ENCOUNTER — Ambulatory Visit (INDEPENDENT_AMBULATORY_CARE_PROVIDER_SITE_OTHER): Payer: Medicare Other | Admitting: Internal Medicine

## 2021-01-05 ENCOUNTER — Other Ambulatory Visit: Payer: Self-pay

## 2021-01-05 ENCOUNTER — Encounter: Payer: Self-pay | Admitting: Internal Medicine

## 2021-01-05 VITALS — BP 150/90 | HR 77 | Ht 68.0 in | Wt 204.0 lb

## 2021-01-05 DIAGNOSIS — I4819 Other persistent atrial fibrillation: Secondary | ICD-10-CM | POA: Diagnosis not present

## 2021-01-05 DIAGNOSIS — I10 Essential (primary) hypertension: Secondary | ICD-10-CM | POA: Diagnosis not present

## 2021-01-05 NOTE — Progress Notes (Signed)
HPI Debbie [name] returns today for followup of atrial fib. She is a pleasant 75 yo woman with a h/o atrial fib and underwent initiation of dofetilide over a year ago. In the interim, she has done reasonably well except is bothered by dyspnea especially when she takes of her oxygen and tries to walk. She has no chest pain. Her dyspnea is improved in NSR. We had to reduce her dose of dofetilide from 500 to 250 due to QT prolongation.  Allergies  Allergen Reactions  . Meloxicam Other (See Comments)    Causes excess Fluid buildup     Current Outpatient Medications  Medication Sig Dispense Refill  . acetaminophen (TYLENOL) 500 MG tablet Take 500 mg by mouth as needed for mild pain.     Marland Kitchen apixaban (ELIQUIS) 5 MG TABS tablet Take 1 tablet (5 mg total) by mouth 2 (two) times daily. 60 tablet 0  . BD PEN NEEDLE NANO U/F 32G X 4 MM MISC 2 (two) times daily. as directed  6  . Calcium Carb-Cholecalciferol (CALCIUM 1000 + D PO) Take 1,000 mg by mouth daily.    . Coenzyme Q10 400 MG CAPS Take 400 mg by mouth daily.     Marland Kitchen dexlansoprazole (DEXILANT) 60 MG capsule Take 1 capsule (60 mg total) by mouth daily.    Marland Kitchen diltiazem (CARDIZEM CD) 240 MG 24 hr capsule Take 1 capsule (240 mg total) by mouth daily. 30 capsule 0  . dofetilide (TIKOSYN) 250 MCG capsule TAKE 1 CAPSULE BY MOUTH TWICE A DAY 180 capsule 3  . ezetimibe (ZETIA) 10 MG tablet Take 10 mg by mouth every evening.     . fluticasone (FLONASE) 50 MCG/ACT nasal spray Place 1 spray into the nose daily as needed for allergies.     . furosemide (LASIX) 40 MG tablet Take 0.5 tablets (20 mg total) by mouth daily as needed for fluid or edema. 30 tablet 0  . gabapentin (NEURONTIN) 100 MG capsule Take 100-200 mg by mouth See admin instructions. Take 200 mg in the morning and 100 mg at night    . halobetasol (ULTRAVATE) 0.05 % cream Apply 1 application topically 2 (two) times daily as needed (psoriasis).     . ibandronate (BONIVA) 150 MG tablet Take 150  mg by mouth every 30 (thirty) days.    Marland Kitchen ibuprofen (ADVIL,MOTRIN) 200 MG tablet Take 400 mg by mouth as needed for headache, mild pain or moderate pain.     . Insulin Glargine (BASAGLAR KWIKPEN) 100 UNIT/ML SOPN Inject 45 Units into the skin daily.     . Insulin Pen Needle 32G X 4 MM MISC USE TWICE DAILY AS DIRECTED    . ketotifen (ZADITOR) 0.025 % ophthalmic solution Place 1 drop into both eyes daily as needed (dry eyes).    Marland Kitchen lidocaine (LIDODERM) 5 % Place 1 patch onto the skin as needed. Remove & Discard patch within 12 hours or as directed by MD    . Lidocaine-Glycerin (PREPARATION H EX) Apply topically as needed.    Marland Kitchen losartan (COZAAR) 100 MG tablet TAKE 1 TABLET BY MOUTH EVERY DAY 90 tablet 1  . magnesium oxide (MAG-OX) 400 (241.3 Mg) MG tablet Take 1 tablet (400 mg total) by mouth 2 (two) times daily. Please make yearly appt with Dr. [name] for April 2022 for future refills. Thank you 1st attempt 60 tablet 2  . OXYGEN Place 3 L into the nose See admin instructions. 3 lpm with sleep and exertion  APS    . SitaGLIPtin-MetFORMIN HCl 50-1000 MG TB24 Take 1 tablet by mouth 2 (two) times daily.    . tamsulosin (FLOMAX) 0.4 MG CAPS capsule Take 0.4 mg by mouth daily.    Marland Kitchen tiZANidine (ZANAFLEX) 4 MG tablet Take 4 mg by mouth at bedtime.    . VOLTAREN 1 % GEL Apply 2 g topically 4 (four) times daily as needed (pain).   2   No current facility-administered medications for this visit.     Past Medical History:  Diagnosis Date  . A-fib ([place]) 11/15/2019  . Abnormal liver function   . Adenomatous polyp 12/04/2006  . AKI (acute kidney injury) ([place])   . Asthma   . COPD (chronic obstructive pulmonary disease) ([place])   . DM type 2 (diabetes mellitus, type 2) ([place])   . GERD (gastroesophageal reflux disease) 02/28/2012  . Hemorrhoid 12/04/2006  . Hyperlipidemia   . Hypertension   . Hypomagnesemia   . Hypotension 09/21/2019  . OSA (obstructive sleep apnea) 09/23/2014   NPSG 08/2014:  AHI 9/hr  But  Completely intolerant of cpap.   - see chronic resp failure   . Peripheral arterial disease ([place])   . Persistent atrial fibrillation ([place]) 11/15/2019  . Pulmonary hypertension ([place]) 03/28/2017   Echo 04/04/17 Compared to a prior study in 2015,   there is now moderate LVH and the LVEF is higher at 65-70%. No   obvious PFO noted by saline microbubble contrast. There is   moderate TR with an RVSP of 56 mmHg and a normal, collapsing IVC.   Consistent with moderate pulmonary hypertension.   C/w WHO III  rx  = adequate 02 / wt loss if possible   . Sepsis ([place]) 09/22/2019  . 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   Past Surgical History:  Procedure Laterality Date  . CARDIOVERSION N/A 10/18/2019   Procedure: CARDIOVERSION;  Surgeon: [name], MD;  Location: [facility] ENDOSCOPY;  Service: Cardiovascular;  Laterality: N/A;  . CHOLECYSTECTOMY    . COLONOSCOPY  12/03/2006   Dr. Delight Ovens, adenomatous polyp  . COLONOSCOPY  03/25/2012   Procedure: COLONOSCOPY;  Surgeon: Daneil Dolin, MD;  Location[facility] ENDO SUITE;  Service: Endoscopy;  Laterality: N/A;  10:30  . ESOPHAGOGASTRODUODENOSCOPY  11/03/2002   Dr. Gala Romney- normal exam- was done to check for possible foreign body  . Fiberoptic bronchoscopy with endobronchial  ultrasound  10/22/2010   [place]  . Right BKA  1990  . RIGHT HEART CATHETERIZATION N/A 06/22/2014   Procedure: RIGHT HEART CATH;  Surgeon: Larey Dresser, MD;  Location: [facility] CATH LAB;  Service: Cardiovascular;  Laterality: N/A;     Family History  Problem Relation Age of Onset  . COPD Sister   . Diabetes Sister   . Breast cancer Sister        Mastectomy  . CVA Mother 77  . Heart disease Mother   . Diabetes Mother 71  . Lung cancer Father        lung carcinoma  . Colon cancer Neg Hx      Social History   Socioeconomic History  . Marital status:  Married    Spouse name: Anokhi Alonge  . Number of children: 3  . Years of education: Not on file  . Highest education level: Not on file  Occupational History  .  Occupation: retired    Fish farm manager: UNEMPLOYED  Tobacco Use  . Smoking status: Former Smoker    Packs/day: 0.50    Years: 18.00    Pack years: 9.00    Types: Cigarettes    Quit date: 09/16/1990    Years since quitting: 30.3  . Smokeless tobacco: Never Used  Substance and Sexual Activity  . Alcohol use: No    Alcohol/week: 0.0 standard drinks  . Drug use: No  . Sexual activity: Not on file  Other Topics Concern  . Not on file  Social History Narrative  . Not on file   Social Determinants of Health   Financial Resource Strain: Not on file  Food Insecurity: Not on file  Transportation Needs: Not on file  Physical Activity: Not on file  Stress: Not on file  Social Connections: Not on file  Intimate Partner Violence: Not on file     BP (!) 150/90 (BP Location: Left Arm, Patient Position: Sitting, Cuff Size: Normal)   Pulse 77   Ht 5\' 8"  (1.727 m)   Wt 204 lb (92.5 kg)   SpO2 92%   BMI 31.02 kg/m   Physical Exam:  Chronically ill appearing NAD HEENT: Unremarkable Neck:  No JVD, no thyromegally Lymphatics:  No adenopathy Back:  No CVA tenderness Lungs:  Clear with decreased breath sounds HEART:  Regular rate rhythm, no murmurs, no rubs, no clicks Abd:  soft, positive bowel sounds, no organomegally, no rebound, no guarding Ext:  2 plus pulses, no edema, no cyanosis, no clubbing; right lower leg prosthesis Skin:  No rashes no nodules Neuro:  CN II through XII intact, motor grossly intact  EKG NSR   Assess/Plan: 1. PAF - she is maintaining NSR. No change in dofetilide. 2. HTN - her bp is high but she notes it is ok at home. White coat HTN. 3. COPD - she is on home oxygen.  4. coags - she has not had any bleeding on the eliquis.  Debbie Overlie Donnella Morford,MD

## 2021-01-05 NOTE — Patient Instructions (Signed)

## 2021-03-03 ENCOUNTER — Other Ambulatory Visit: Payer: Self-pay

## 2021-03-03 ENCOUNTER — Encounter (HOSPITAL_BASED_OUTPATIENT_CLINIC_OR_DEPARTMENT_OTHER): Payer: Self-pay

## 2021-03-03 ENCOUNTER — Observation Stay (HOSPITAL_BASED_OUTPATIENT_CLINIC_OR_DEPARTMENT_OTHER)
Admission: EM | Admit: 2021-03-03 | Discharge: 2021-03-05 | Disposition: A | Discharge status: Home / Self Care | Payer: Medicare Other | Attending: Emergency Medicine | Admitting: Emergency Medicine

## 2021-03-03 DIAGNOSIS — E1142 Type 2 diabetes mellitus with diabetic polyneuropathy: Secondary | ICD-10-CM | POA: Diagnosis not present

## 2021-03-03 DIAGNOSIS — T83511A Infection and inflammatory reaction due to indwelling urethral catheter, initial encounter: Principal | ICD-10-CM | POA: Insufficient documentation

## 2021-03-03 DIAGNOSIS — K219 Gastro-esophageal reflux disease without esophagitis: Secondary | ICD-10-CM | POA: Diagnosis present

## 2021-03-03 DIAGNOSIS — I152 Hypertension secondary to endocrine disorders: Secondary | ICD-10-CM | POA: Diagnosis present

## 2021-03-03 DIAGNOSIS — N3001 Acute cystitis with hematuria: Secondary | ICD-10-CM | POA: Diagnosis not present

## 2021-03-03 DIAGNOSIS — Z20822 Contact with and (suspected) exposure to covid-19: Secondary | ICD-10-CM | POA: Insufficient documentation

## 2021-03-03 DIAGNOSIS — Z7984 Long term (current) use of oral hypoglycemic drugs: Secondary | ICD-10-CM | POA: Insufficient documentation

## 2021-03-03 DIAGNOSIS — J9611 Chronic respiratory failure with hypoxia: Secondary | ICD-10-CM | POA: Diagnosis not present

## 2021-03-03 DIAGNOSIS — I5032 Chronic diastolic (congestive) heart failure: Secondary | ICD-10-CM | POA: Insufficient documentation

## 2021-03-03 DIAGNOSIS — I4891 Unspecified atrial fibrillation: Secondary | ICD-10-CM | POA: Diagnosis present

## 2021-03-03 DIAGNOSIS — Z794 Long term (current) use of insulin: Secondary | ICD-10-CM | POA: Diagnosis not present

## 2021-03-03 DIAGNOSIS — E1159 Type 2 diabetes mellitus with other circulatory complications: Secondary | ICD-10-CM | POA: Diagnosis present

## 2021-03-03 DIAGNOSIS — I472 Ventricular tachycardia: Secondary | ICD-10-CM | POA: Insufficient documentation

## 2021-03-03 DIAGNOSIS — J449 Chronic obstructive pulmonary disease, unspecified: Secondary | ICD-10-CM | POA: Diagnosis present

## 2021-03-03 DIAGNOSIS — Z87891 Personal history of nicotine dependence: Secondary | ICD-10-CM | POA: Insufficient documentation

## 2021-03-03 DIAGNOSIS — Z79899 Other long term (current) drug therapy: Secondary | ICD-10-CM | POA: Diagnosis not present

## 2021-03-03 DIAGNOSIS — Y846 Urinary catheterization as the cause of abnormal reaction of the patient, or of later complication, without mention of misadventure at the time of the procedure: Secondary | ICD-10-CM | POA: Diagnosis not present

## 2021-03-03 DIAGNOSIS — E876 Hypokalemia: Secondary | ICD-10-CM | POA: Diagnosis not present

## 2021-03-03 DIAGNOSIS — I110 Hypertensive heart disease with heart failure: Secondary | ICD-10-CM | POA: Insufficient documentation

## 2021-03-03 DIAGNOSIS — J45909 Unspecified asthma, uncomplicated: Secondary | ICD-10-CM | POA: Diagnosis not present

## 2021-03-03 DIAGNOSIS — Z89511 Acquired absence of right leg below knee: Secondary | ICD-10-CM | POA: Diagnosis not present

## 2021-03-03 DIAGNOSIS — R300 Dysuria: Secondary | ICD-10-CM | POA: Diagnosis present

## 2021-03-03 DIAGNOSIS — N390 Urinary tract infection, site not specified: Secondary | ICD-10-CM | POA: Diagnosis present

## 2021-03-03 DIAGNOSIS — E1169 Type 2 diabetes mellitus with other specified complication: Secondary | ICD-10-CM | POA: Diagnosis present

## 2021-03-03 DIAGNOSIS — I4729 Other ventricular tachycardia: Secondary | ICD-10-CM

## 2021-03-03 LAB — CBC WITH DIFFERENTIAL/PLATELET
Abs Immature Granulocytes: 0.05 10*3/uL (ref 0.00–0.07)
Basophils Absolute: 0 10*3/uL (ref 0.0–0.1)
Basophils Relative: 0 %
Eosinophils Absolute: 0 10*3/uL (ref 0.0–0.5)
Eosinophils Relative: 0 %
HCT: 32.9 % — ABNORMAL LOW (ref 36.0–46.0)
Hemoglobin: 10.3 g/dL — ABNORMAL LOW (ref 12.0–15.0)
Immature Granulocytes: 1 %
Lymphocytes Relative: 3 %
Lymphs Abs: 0.3 10*3/uL — ABNORMAL LOW (ref 0.7–4.0)
MCH: 24.3 pg — ABNORMAL LOW (ref 26.0–34.0)
MCHC: 31.3 g/dL (ref 30.0–36.0)
MCV: 77.6 fL — ABNORMAL LOW (ref 80.0–100.0)
Monocytes Absolute: 0.1 10*3/uL (ref 0.1–1.0)
Monocytes Relative: 1 %
Neutro Abs: 8.5 10*3/uL — ABNORMAL HIGH (ref 1.7–7.7)
Neutrophils Relative %: 95 %
Platelets: 286 10*3/uL (ref 150–400)
RBC: 4.24 MIL/uL (ref 3.87–5.11)
RDW: 15.3 % (ref 11.5–15.5)
WBC: 8.9 10*3/uL (ref 4.0–10.5)
nRBC: 0 % (ref 0.0–0.2)

## 2021-03-03 NOTE — ED Notes (Signed)
RT placed pt on [place] 6 Lpm d/t WOB and desaturation to 80's. Pt respiratory status is currently stable on the 6 Lpm [place]. Pt normally wears [place] 3 Lpm w/50 feet of O2 tubing.RT will continue to monitor.

## 2021-03-03 NOTE — ED Notes (Signed)
MD notified of HR.

## 2021-03-03 NOTE — ED Triage Notes (Signed)
Patient here POV from Home with Nausea, Vomiting, Diarrhea, Body Aches, Urinary Symptoms.  Symptoms have been present for a few months and patient had a Cystoscopy and is awaiting for results.  Patient BIB Wheelchair, GCS 15, A&Ox4; Patient wears 3L [place] of Oxygen at Baseline.

## 2021-03-04 ENCOUNTER — Emergency Department (HOSPITAL_BASED_OUTPATIENT_CLINIC_OR_DEPARTMENT_OTHER): Payer: Medicare Other

## 2021-03-04 ENCOUNTER — Other Ambulatory Visit (HOSPITAL_BASED_OUTPATIENT_CLINIC_OR_DEPARTMENT_OTHER): Payer: Medicare Other | Admitting: Radiology

## 2021-03-04 ENCOUNTER — Encounter (HOSPITAL_COMMUNITY): Payer: Self-pay | Admitting: Family Medicine

## 2021-03-04 DIAGNOSIS — I4891 Unspecified atrial fibrillation: Secondary | ICD-10-CM

## 2021-03-04 DIAGNOSIS — Z20822 Contact with and (suspected) exposure to covid-19: Secondary | ICD-10-CM | POA: Diagnosis not present

## 2021-03-04 DIAGNOSIS — E876 Hypokalemia: Secondary | ICD-10-CM | POA: Diagnosis not present

## 2021-03-04 DIAGNOSIS — Z7984 Long term (current) use of oral hypoglycemic drugs: Secondary | ICD-10-CM | POA: Diagnosis not present

## 2021-03-04 DIAGNOSIS — I472 Ventricular tachycardia: Secondary | ICD-10-CM

## 2021-03-04 DIAGNOSIS — E1142 Type 2 diabetes mellitus with diabetic polyneuropathy: Secondary | ICD-10-CM

## 2021-03-04 DIAGNOSIS — E782 Mixed hyperlipidemia: Secondary | ICD-10-CM

## 2021-03-04 DIAGNOSIS — Y846 Urinary catheterization as the cause of abnormal reaction of the patient, or of later complication, without mention of misadventure at the time of the procedure: Secondary | ICD-10-CM | POA: Diagnosis not present

## 2021-03-04 DIAGNOSIS — N3001 Acute cystitis with hematuria: Secondary | ICD-10-CM | POA: Diagnosis not present

## 2021-03-04 DIAGNOSIS — I110 Hypertensive heart disease with heart failure: Secondary | ICD-10-CM | POA: Diagnosis not present

## 2021-03-04 DIAGNOSIS — T83511A Infection and inflammatory reaction due to indwelling urethral catheter, initial encounter: Secondary | ICD-10-CM

## 2021-03-04 DIAGNOSIS — J9611 Chronic respiratory failure with hypoxia: Secondary | ICD-10-CM | POA: Diagnosis not present

## 2021-03-04 DIAGNOSIS — J449 Chronic obstructive pulmonary disease, unspecified: Secondary | ICD-10-CM

## 2021-03-04 DIAGNOSIS — Z794 Long term (current) use of insulin: Secondary | ICD-10-CM | POA: Diagnosis not present

## 2021-03-04 DIAGNOSIS — Z87891 Personal history of nicotine dependence: Secondary | ICD-10-CM | POA: Diagnosis not present

## 2021-03-04 DIAGNOSIS — Z89511 Acquired absence of right leg below knee: Secondary | ICD-10-CM | POA: Diagnosis not present

## 2021-03-04 DIAGNOSIS — N390 Urinary tract infection, site not specified: Secondary | ICD-10-CM

## 2021-03-04 DIAGNOSIS — Z79899 Other long term (current) drug therapy: Secondary | ICD-10-CM | POA: Diagnosis not present

## 2021-03-04 DIAGNOSIS — E1169 Type 2 diabetes mellitus with other specified complication: Secondary | ICD-10-CM

## 2021-03-04 DIAGNOSIS — R300 Dysuria: Secondary | ICD-10-CM | POA: Diagnosis present

## 2021-03-04 DIAGNOSIS — I10 Essential (primary) hypertension: Secondary | ICD-10-CM | POA: Diagnosis not present

## 2021-03-04 DIAGNOSIS — K219 Gastro-esophageal reflux disease without esophagitis: Secondary | ICD-10-CM

## 2021-03-04 DIAGNOSIS — I5032 Chronic diastolic (congestive) heart failure: Secondary | ICD-10-CM | POA: Diagnosis not present

## 2021-03-04 DIAGNOSIS — I4729 Other ventricular tachycardia: Secondary | ICD-10-CM

## 2021-03-04 DIAGNOSIS — J45909 Unspecified asthma, uncomplicated: Secondary | ICD-10-CM | POA: Diagnosis not present

## 2021-03-04 LAB — BASIC METABOLIC PANEL
Anion gap: 13 (ref 5–15)
BUN: 14 mg/dL (ref 8–23)
CO2: 22 mmol/L (ref 22–32)
Calcium: 9.2 mg/dL (ref 8.9–10.3)
Chloride: 105 mmol/L (ref 98–111)
Creatinine, Ser: 0.73 mg/dL (ref 0.44–1.00)
GFR, Estimated: >60 mL/min (ref >60)
Glucose, Bld: 105 mg/dL — ABNORMAL HIGH (ref 70–99)
Potassium: 3.3 mmol/L — ABNORMAL LOW (ref 3.5–5.1)
Sodium: 140 mmol/L (ref 135–145)

## 2021-03-04 LAB — RENAL FUNCTION PANEL
Albumin: 2.9 g/dL — ABNORMAL LOW (ref 3.5–5.0)
Anion gap: 5 (ref 5–15)
BUN: 16 mg/dL (ref 8–23)
CO2: 26 mmol/L (ref 22–32)
Calcium: 8.6 mg/dL — ABNORMAL LOW (ref 8.9–10.3)
Chloride: 104 mmol/L (ref 98–111)
Creatinine, Ser: 0.8 mg/dL (ref 0.44–1.00)
GFR, Estimated: >60 mL/min (ref >60)
Glucose, Bld: 137 mg/dL — ABNORMAL HIGH (ref 70–99)
Phosphorus: 2.7 mg/dL (ref 2.5–4.6)
Potassium: 4.2 mmol/L (ref 3.5–5.1)
Sodium: 135 mmol/L (ref 135–145)

## 2021-03-04 LAB — CBG MONITORING, ED
Glucose-Capillary: 139 mg/dL — ABNORMAL HIGH (ref 70–99)
Glucose-Capillary: 140 mg/dL — ABNORMAL HIGH (ref 70–99)
Glucose-Capillary: 143 mg/dL — ABNORMAL HIGH (ref 70–99)
Glucose-Capillary: 104 mg/dL — ABNORMAL HIGH (ref 70–99)
Glucose-Capillary: 199 mg/dL — ABNORMAL HIGH (ref 70–99)

## 2021-03-04 LAB — URINALYSIS, ROUTINE W REFLEX MICROSCOPIC
Glucose, UA: NEGATIVE mg/dL
Ketones, ur: NEGATIVE mg/dL
Nitrite: POSITIVE — AB
Protein, ur: 30 mg/dL — AB
RBC / HPF: >50 RBC/hpf — ABNORMAL HIGH (ref 0–5)
Specific Gravity, Urine: 1.018 (ref 1.005–1.030)
WBC, UA: >50 WBC/hpf — ABNORMAL HIGH (ref 0–5)
pH: 5.5 (ref 5.0–8.0)

## 2021-03-04 LAB — TROPONIN I (HIGH SENSITIVITY)
Troponin I (High Sensitivity): 14 ng/L (ref <18)
Troponin I (High Sensitivity): 55 ng/L — ABNORMAL HIGH (ref <18)

## 2021-03-04 LAB — BRAIN NATRIURETIC PEPTIDE: B Natriuretic Peptide: 186.1 pg/mL — ABNORMAL HIGH (ref 0.0–100.0)

## 2021-03-04 LAB — RESP PANEL BY RT-PCR (FLU A&B, COVID) ARPGX2
Influenza A by PCR: NEGATIVE
Influenza B by PCR: NEGATIVE
SARS Coronavirus 2 by RT PCR: NEGATIVE

## 2021-03-04 LAB — GLUCOSE, CAPILLARY: Glucose-Capillary: 97 mg/dL (ref 70–99)

## 2021-03-04 LAB — TSH: TSH: 1.036 u[IU]/mL (ref 0.350–4.500)

## 2021-03-04 LAB — MAGNESIUM
Magnesium: 1.3 mg/dL — ABNORMAL LOW (ref 1.7–2.4)
Magnesium: 1.8 mg/dL (ref 1.7–2.4)

## 2021-03-04 MED ORDER — ORAL CARE MOUTH RINSE
15.0000 mL | Freq: Two times a day (BID) | OROMUCOSAL | Status: DC
  Administered 2021-03-05: 15 mL via OROMUCOSAL

## 2021-03-04 MED ORDER — DILTIAZEM HCL-DEXTROSE 125-5 MG/125ML-% IV SOLN (PREMIX)
5.0000 mg/h | INTRAVENOUS | Status: DC
  Administered 2021-03-04: 5 mg/h via INTRAVENOUS
  Filled 2021-03-04: qty 125

## 2021-03-04 MED ORDER — INSULIN ASPART 100 UNIT/ML IJ SOLN
0.0000 [IU] | Freq: Three times a day (TID) | INTRAMUSCULAR | Status: DC
  Administered 2021-03-04: 2 [IU] via SUBCUTANEOUS
  Administered 2021-03-04: 3 [IU] via SUBCUTANEOUS

## 2021-03-04 MED ORDER — POTASSIUM CHLORIDE 10 MEQ/100ML IV SOLN
10.0000 meq | INTRAVENOUS | Status: AC
  Administered 2021-03-04 (×2): 10 meq via INTRAVENOUS
  Filled 2021-03-04 (×2): qty 100

## 2021-03-04 MED ORDER — SITAGLIP PHOS-METFORMIN HCL ER 50-1000 MG PO TB24: 1.0000 | ORAL_TABLET | Freq: Two times a day (BID) | ORAL | Status: DC

## 2021-03-04 MED ORDER — ACETAMINOPHEN 325 MG PO TABS: 650.0000 mg | ORAL_TABLET | ORAL | Status: DC | PRN

## 2021-03-04 MED ORDER — LOSARTAN POTASSIUM 50 MG PO TABS
100.0000 mg | ORAL_TABLET | Freq: Every day | ORAL | Status: DC
  Administered 2021-03-04 – 2021-03-05 (×2): 100 mg via ORAL
  Filled 2021-03-04: qty 2
  Filled 2021-03-04: qty 4

## 2021-03-04 MED ORDER — INSULIN GLARGINE 100 UNIT/ML ~~LOC~~ SOLN
20.0000 [IU] | Freq: Every day | SUBCUTANEOUS | Status: DC
  Administered 2021-03-04: 20 [IU] via SUBCUTANEOUS
  Filled 2021-03-04 (×2): qty 0.2

## 2021-03-04 MED ORDER — SODIUM CHLORIDE 0.9 % IV SOLN
1.0000 g | Freq: Once | INTRAVENOUS | Status: AC
  Administered 2021-03-04: 1 g via INTRAVENOUS
  Filled 2021-03-04: qty 10

## 2021-03-04 MED ORDER — APIXABAN 5 MG PO TABS
5.0000 mg | ORAL_TABLET | Freq: Two times a day (BID) | ORAL | Status: DC
  Administered 2021-03-04 – 2021-03-05 (×3): 5 mg via ORAL
  Filled 2021-03-04 (×2): qty 1
  Filled 2021-03-04: qty 2

## 2021-03-04 MED ORDER — DILTIAZEM HCL ER COATED BEADS 240 MG PO CP24
240.0000 mg | ORAL_CAPSULE | Freq: Every day | ORAL | Status: DC
  Administered 2021-03-04 – 2021-03-05 (×2): 240 mg via ORAL
  Filled 2021-03-04: qty 2
  Filled 2021-03-04: qty 1

## 2021-03-04 MED ORDER — IBANDRONATE SODIUM 150 MG PO TABS: 150.0000 mg | ORAL_TABLET | ORAL | Status: DC

## 2021-03-04 MED ORDER — SODIUM CHLORIDE 0.9 % IV SOLN
1.0000 g | INTRAVENOUS | Status: DC
  Administered 2021-03-05: 1 g via INTRAVENOUS
  Filled 2021-03-04: qty 10

## 2021-03-04 MED ORDER — GABAPENTIN 100 MG PO CAPS
100.0000 mg | ORAL_CAPSULE | Freq: Every day | ORAL | Status: DC
  Administered 2021-03-04: 100 mg via ORAL
  Filled 2021-03-04: qty 1

## 2021-03-04 MED ORDER — ONDANSETRON HCL 4 MG/2ML IJ SOLN: 4.0000 mg | Freq: Four times a day (QID) | INTRAMUSCULAR | Status: DC | PRN

## 2021-03-04 MED ORDER — PANTOPRAZOLE SODIUM 40 MG PO TBEC
40.0000 mg | DELAYED_RELEASE_TABLET | Freq: Every day | ORAL | Status: DC
  Administered 2021-03-04 – 2021-03-05 (×2): 40 mg via ORAL
  Filled 2021-03-04 (×2): qty 1

## 2021-03-04 MED ORDER — SODIUM CHLORIDE 0.9 % IV SOLN: INTRAVENOUS | Status: DC

## 2021-03-04 MED ORDER — ONDANSETRON HCL 4 MG/2ML IJ SOLN
4.0000 mg | Freq: Once | INTRAMUSCULAR | Status: AC
  Administered 2021-03-04: 4 mg via INTRAVENOUS
  Filled 2021-03-04: qty 2

## 2021-03-04 MED ORDER — POLYETHYLENE GLYCOL 3350 17 G PO PACK: 17.0000 g | PACK | Freq: Every day | ORAL | Status: DC | PRN

## 2021-03-04 MED ORDER — INSULIN ASPART 100 UNIT/ML IJ SOLN
0.0000 [IU] | Freq: Three times a day (TID) | INTRAMUSCULAR | Status: DC
  Administered 2021-03-05 (×2): 2 [IU] via SUBCUTANEOUS

## 2021-03-04 MED ORDER — TIZANIDINE HCL 4 MG PO TABS
4.0000 mg | ORAL_TABLET | Freq: Every day | ORAL | Status: DC
  Administered 2021-03-04: 4 mg via ORAL
  Filled 2021-03-04 (×3): qty 1

## 2021-03-04 MED ORDER — EZETIMIBE 10 MG PO TABS
10.0000 mg | ORAL_TABLET | Freq: Every evening | ORAL | Status: DC
  Administered 2021-03-04: 10 mg via ORAL
  Filled 2021-03-04: qty 1

## 2021-03-04 MED ORDER — GABAPENTIN 100 MG PO CAPS
200.0000 mg | ORAL_CAPSULE | Freq: Every day | ORAL | Status: DC
  Administered 2021-03-04 – 2021-03-05 (×2): 200 mg via ORAL
  Filled 2021-03-04 (×2): qty 2

## 2021-03-04 MED ORDER — SODIUM CHLORIDE 0.9 % IV BOLUS
500.0000 mL | Freq: Once | INTRAVENOUS | Status: AC
  Administered 2021-03-04: 500 mL via INTRAVENOUS

## 2021-03-04 MED ORDER — LINAGLIPTIN 5 MG PO TABS
5.0000 mg | ORAL_TABLET | Freq: Every day | ORAL | Status: DC
  Administered 2021-03-04: 5 mg via ORAL
  Filled 2021-03-04: qty 1

## 2021-03-04 MED ORDER — DOFETILIDE 250 MCG PO CAPS
250.0000 ug | ORAL_CAPSULE | Freq: Two times a day (BID) | ORAL | Status: DC
  Administered 2021-03-04 – 2021-03-05 (×3): 250 ug via ORAL
  Filled 2021-03-04 (×5): qty 1

## 2021-03-04 MED ORDER — GABAPENTIN 100 MG PO CAPS: 100.0000 mg | ORAL_CAPSULE | ORAL | Status: DC

## 2021-03-04 MED ORDER — DILTIAZEM HCL 25 MG/5ML IV SOLN
20.0000 mg | Freq: Once | INTRAVENOUS | Status: AC
  Administered 2021-03-04: 20 mg via INTRAVENOUS
  Filled 2021-03-04: qty 5

## 2021-03-04 MED ORDER — METFORMIN HCL 500 MG PO TABS
1000.0000 mg | ORAL_TABLET | Freq: Two times a day (BID) | ORAL | Status: DC
  Administered 2021-03-04: 1000 mg via ORAL
  Filled 2021-03-04: qty 2

## 2021-03-04 NOTE — ED Notes (Signed)
Debbie Luna- [phone]- daughter- can be updated with room number.

## 2021-03-04 NOTE — ED Provider Notes (Signed)
[facility] Provider Note: Debbie Spurling, MD, FACEP  CSN: [id number] MRN: [id number] ARRIVAL: 03/03/21 at 2258 ROOM: DB011/DB011   CHIEF COMPLAINT  Nausea and Generalized Body Aches   HISTORY OF PRESENT ILLNESS  03/04/21 12:22 AM Debbie [name] is a 75 y.o. female with a history of atrial fibrillation on Eliquis.  To her knowledge she has not been in atrial fibrillation recently.  She also has chronic respiratory disease and is on 3 L of oxygen by nasal cannula.   She has a history of recurrent urinary tract infections and has had a cystoscopy recently but has not heard the results.  She is here with dysuria (burning with urination) that began 2 days ago but worsened yesterday morning.  She rates the pain as a 10 out of 10 with urination.  She also has intermittent suprapubic pain.  Yesterday afternoon she developed nausea and vomiting and estimates she has vomited about 5 or 6 times.  She has had associated diarrhea and body aches as well.   Past Medical History:  Diagnosis Date   A-fib ([place]) 11/15/2019   Abnormal liver function    Adenomatous polyp 12/04/2006   AKI (acute kidney injury) ([place])    Asthma    COPD (chronic obstructive pulmonary disease) ([place])    DM type 2 (diabetes mellitus, type 2) ([facility])    GERD (gastroesophageal reflux disease) 02/28/2012   Hemorrhoid 12/04/2006   Hyperlipidemia    Hypertension    Hypomagnesemia    Hypotension 09/21/2019   OSA (obstructive sleep apnea) 09/23/2014   NPSG 08/2014:  AHI 9/hr  But Completely intolerant of cpap.   - see chronic resp failure    Peripheral arterial disease ([place])    Persistent atrial fibrillation ([place]) 11/15/2019   Pulmonary hypertension ([place]) 03/28/2017   Echo 04/04/17 Compared to a prior study in 2015,   there is now moderate LVH and the LVEF is higher at 65-70%. No   obvious PFO noted by saline microbubble contrast. There is   moderate TR with an RVSP of 56 mmHg and a normal, collapsing IVC.   Consistent with  moderate pulmonary hypertension.   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 Delight Ovens, adenomatous polyp   COLONOSCOPY  03/25/2012   Procedure: COLONOSCOPY;  Surgeon: Daneil Dolin, MD;  [facility] ENDO [facility];  Service: Endoscopy;  Laterality: N/A;  10:30   ESOPHAGOGASTRODUODENOSCOPY  11/03/2002   Dr. Gala Romney- normal exam- was done to check for possible foreign body   Fiberoptic bronchoscopy with endobronchial  ultrasound  10/22/2010   [place]   Right BKA  1990   RIGHT HEART CATHETERIZATION N/A 06/22/2014   Procedure: RIGHT HEART CATH;  Surgeon: Larey Dresser, MD;  Location[facility] C[facility] LAB;  Service: Cardiovascular;  Laterality: N/A;    Family History  Problem Relation Age of Onset   COPD Sister    Diabetes Sister    Breast cancer Sister        Mastectomy   CVA Mother 5   Heart disease Mother  Diabetes Mother 80   Lung cancer Father        lung carcinoma   Colon cancer Neg Hx     Social History   Tobacco Use   Smoking status: Former    Packs/day: 0.50    Years: 18.00    Pack years: 9.00    Types: Cigarettes    Quit date: 09/16/1990    Years since quitting: 30.4   Smokeless tobacco: Never  Substance Use Topics   Alcohol use: No    Alcohol/week: 0.0 standard drinks   Drug use: No    Prior to Admission medications   Medication Sig Start Date End Date Taking? Authorizing Provider  acetaminophen (TYLENOL) 500 MG tablet Take 500 mg by mouth as needed for mild pain.     [provider]  apixaban (ELIQUIS) 5 MG TABS tablet Take 1 tablet (5 mg total) by mouth 2 (two)  times daily. 09/24/19   Florencia Reasons, MD  BD PEN NEEDLE NANO U/F 32G X 4 MM MISC 2 (two) times daily. as directed 02/26/15   [provider]  Calcium Carb-Cholecalciferol (CALCIUM 1000 + D PO) Take 1,000 mg by mouth daily.    [provider]  Coenzyme Q10 400 MG CAPS Take 400 mg by mouth daily.     [provider]  dexlansoprazole (DEXILANT) 60 MG capsule Take 1 capsule (60 mg total) by mouth daily. 09/29/15   Tanda Rockers, MD  diltiazem (CARDIZEM CD) 240 MG 24 hr capsule Take 1 capsule (240 mg total) by mouth daily. 09/25/19   Florencia Reasons, MD  dofetilide (TIKOSYN) 250 MCG capsule TAKE 1 CAPSULE BY MOUTH TWICE A DAY 09/12/20   Evans Lance, MD  ezetimibe (ZETIA) 10 MG tablet Take 10 mg by mouth every evening.  08/27/19   [provider]  fluticasone (FLONASE) 50 MCG/ACT nasal spray Place 1 spray into the nose daily as needed for allergies.     [provider]  furosemide (LASIX) 40 MG tablet Take 0.5 tablets (20 mg total) by mouth daily as needed for fluid or edema. 09/24/19 07/04/20  Florencia Reasons, MD  gabapentin (NEURONTIN) 100 MG capsule Take 100-200 mg by mouth See admin instructions. Take 200 mg in the morning and 100 mg at night    [provider]  halobetasol (ULTRAVATE) 0.05 % cream Apply 1 application topically 2 (two) times daily as needed (psoriasis).  08/15/16   [provider]  ibandronate (BONIVA) 150 MG tablet Take 150 mg by mouth every 30 (thirty) days. 02/15/20   [provider]  ibuprofen (ADVIL,MOTRIN) 200 MG tablet Take 400 mg by mouth as needed for headache, mild pain or moderate pain.     [provider]  Insulin Glargine (BASAGLAR KWIKPEN) 100 UNIT/ML SOPN Inject 45 Units into the skin daily.     [provider]  Insulin Pen Needle 32G X 4 MM MISC USE TWICE DAILY AS DIRECTED 05/31/15   [provider]  ketotifen (ZADITOR) 0.025 % ophthalmic solution Place 1 drop into both eyes daily as needed (dry  eyes).    [provider]  lidocaine (LIDODERM) 5 % Place 1 patch onto the skin as needed. Remove & Discard patch within 12 hours or as directed by MD    [provider]  Lidocaine-Glycerin (PREPARATION H EX) Apply topically as needed.    [provider]  losartan (COZAAR) 100 MG tablet TAKE 1 TABLET BY MOUTH EVERY DAY 09/12/20   Roderic Palau  C, NP  magnesium oxide (MAG-OX) 400 (241.3 Mg) MG tablet Take 1 tablet (400 mg total) by mouth 2 (two) times daily. Please make yearly appt with Dr. [name] for April 2022 for future refills. Thank you 1st attempt 10/18/20   Evans Lance, MD  OXYGEN Place 3 L into the nose See admin instructions. 3 lpm with sleep and exertion  APS    [provider]  SitaGLIPtin-MetFORMIN HCl 50-1000 MG TB24 Take 1 tablet by mouth 2 (two) times daily. 01/10/14   [provider]  tamsulosin (FLOMAX) 0.4 MG CAPS capsule Take 0.4 mg by mouth daily. 12/08/20   [provider]  tiZANidine (ZANAFLEX) 4 MG tablet Take 4 mg by mouth at bedtime. 11/04/19   [provider]  VOLTAREN 1 % GEL Apply 2 g topically 4 (four) times daily as needed (pain).  08/13/17   [provider]    Allergies Meloxicam   REVIEW OF SYSTEMS  Negative except as noted here or in the History of Present Illness.   PHYSICAL EXAMINATION  Initial Vital Signs Blood pressure 132/87, pulse (!) 144, temperature 98.7 F (37.1 C), temperature source Oral, resp. rate (!) 29, height 5\' 8"  (1.727 m), weight 90.7 kg, SpO2 93 %.  Examination General: Well-developed, well-nourished female in no acute distress; appearance consistent with age of record HENT: normocephalic; atraumatic Eyes: pupils equal, round and reactive to light; extraocular muscles intact Neck: supple Heart: Irregular rhythm with frequent PVCs (some in pairs or recurrent in groups up to about 6); tachycardia Lungs: clear to auscultation bilaterally Abdomen: soft;  nondistended; nontender; bowel sounds present Extremities: Right BKA; pulses normal Neurologic: Awake, alert and oriented; motor function intact in all extremities and symmetric; no facial droop Skin: Warm and dry Psychiatric: Normal mood and affect   RESULTS  Summary of this visit's results, reviewed and interpreted by myself:   EKG Interpretation  Date/Time:    Ventricular Rate:    PR Interval:    QRS Duration:   QT Interval:    QTC Calculation:   R Axis:     Text Interpretation:          Laboratory Studies: Results for orders placed or performed during the hospital encounter of 03/03/21 (from the past 24 hour(s))  CBC with Differential/Platelet     Status: Abnormal   Collection Time: 03/03/21 11:43 PM  Result Value Ref Range   WBC 8.9 4.0 - 10.5 K/uL   RBC 4.24 3.87 - 5.11 MIL/uL   Hemoglobin 10.3 (L) 12.0 - 15.0 g/dL   HCT 32.9 (L) 36.0 - 46.0 %   MCV 77.6 (L) 80.0 - 100.0 fL   MCH 24.3 (L) 26.0 - 34.0 pg   MCHC 31.3 30.0 - 36.0 g/dL   RDW 15.3 11.5 - 15.5 %   Platelets 286 150 - 400 K/uL   nRBC 0.0 0.0 - 0.2 %   Neutrophils Relative % 95 %   Neutro Abs 8.5 (H) 1.7 - 7.7 K/uL   Lymphocytes Relative 3 %   Lymphs Abs 0.3 (L) 0.7 - 4.0 K/uL   Monocytes Relative 1 %   Monocytes Absolute 0.1 0.1 - 1.0 K/uL   Eosinophils Relative 0 %   Eosinophils Absolute 0.0 0.0 - 0.5 K/uL   Basophils Relative 0 %   Basophils Absolute 0.0 0.0 - 0.1 K/uL   Immature Granulocytes 1 %   Abs Immature Granulocytes 0.05 0.00 - 0.07 K/uL  Basic metabolic panel     Status: Abnormal  Collection Time: 03/03/21 11:43 PM  Result Value Ref Range   Sodium 140 135 - 145 mmol/L   Potassium 3.3 (L) 3.5 - 5.1 mmol/L   Chloride 105 98 - 111 mmol/L   CO2 22 22 - 32 mmol/L   Glucose, Bld 105 (H) 70 - 99 mg/dL   BUN 14 8 - 23 mg/dL   Creatinine, Ser 0.73 0.44 - 1.00 mg/dL   Calcium 9.2 8.9 - 10.3 mg/dL   GFR, Estimated >60 >60 mL/min   Anion gap 13 5 - 15  Troponin I (High Sensitivity)      Status: None   Collection Time: 03/03/21 11:43 PM  Result Value Ref Range   Troponin I (High Sensitivity) 14 <18 ng/L  Urinalysis, Routine w reflex microscopic Nasopharyngeal Swab     Status: Abnormal   Collection Time: 03/04/21 12:05 AM  Result Value Ref Range   Color, Urine ORANGE (A) YELLOW   APPearance HAZY (A) CLEAR   Specific Gravity, Urine 1.018 1.005 - 1.030   pH 5.5 5.0 - 8.0   Glucose, UA NEGATIVE NEGATIVE mg/dL   Hgb urine dipstick LARGE (A) NEGATIVE   Bilirubin Urine SMALL (A) NEGATIVE   Ketones, ur NEGATIVE NEGATIVE mg/dL   Protein, ur 30 (A) NEGATIVE mg/dL   Nitrite POSITIVE (A) NEGATIVE   Leukocytes,Ua MODERATE (A) NEGATIVE   RBC / HPF >50 (H) 0 - 5 RBC/hpf   WBC, UA >50 (H) 0 - 5 WBC/hpf   Bacteria, UA RARE (A) NONE SEEN  Resp Panel by RT-PCR (Flu A&B, Covid) Nasopharyngeal Swab     Status: None   Collection Time: 03/04/21 12:36 AM   Specimen: Nasopharyngeal Swab; Nasopharyngeal(NP) swabs in vial transport medium  Result Value Ref Range   SARS Coronavirus 2 by RT PCR NEGATIVE NEGATIVE   Influenza A by PCR NEGATIVE NEGATIVE   Influenza B by PCR NEGATIVE NEGATIVE   Imaging Studies: DG Chest Port 1 View  Result Date: 03/04/2021 CLINICAL DATA:  Shortness of breath, nausea, vomiting, diarrhea, body aches, and urinary symptoms. EXAM: PORTABLE CHEST 1 VIEW COMPARISON:  06/29/2020 FINDINGS: Heart size and pulmonary vascularity are normal. Interstitial changes in the lungs similar to prior study, likely chronic. No airspace disease or consolidation. No pleural effusions. No pneumothorax. Mediastinal contours appear intact. Calcification of the aorta. IMPRESSION: Chronic interstitial changes in the lungs. No evidence of active pulmonary disease. Electronically Signed   By: Lucienne Capers M.D.   On: 03/04/2021 01:28    ED COURSE and MDM  Nursing notes, initial and subsequent vitals signs, including pulse oximetry, reviewed and interpreted by myself.  Vitals:   03/03/21  2333 03/04/21 0030 03/04/21 0100 03/04/21 0130  BP: 132/87 (!) 143/70 136/67 139/63  Pulse: (!) 144 (!) 116 96 (!) 103  Resp: (!) 29 19 (!) 22 (!) 23  Temp:      TempSrc:      SpO2: 93% 96% 97% 98%  Weight:      Height:       Medications  diltiazem (CARDIZEM) 125 mg in dextrose 5% 125 mL (1 mg/mL) infusion (5 mg/hr Intravenous Rate/Dose Verify 03/04/21 0120)  cefTRIAXone (ROCEPHIN) 1 g in sodium chloride 0.9 % 100 mL IVPB (1 g Intravenous New Bag/Given 03/04/21 0202)  potassium chloride 10 mEq in 100 mL IVPB (has no administration in time range)  sodium chloride 0.9 % bolus 500 mL (0 mLs Intravenous Stopped 03/04/21 0130)  ondansetron (ZOFRAN) injection 4 mg (4 mg Intravenous Given 03/04/21 0037)  diltiazem (  CARDIZEM) injection 20 mg (20 mg Intravenous Given 03/04/21 0043)   1:54 AM The patient's rate is better controlled now at about 100.  She still continues to have PVCs and small clusters.  She and her daughter state that she has never been told in the past that she had this.  She clearly has a urinary tract infection based on her urinalysis.  Urine has been sent for culture.  We have started her on Rocephin.  She has a history of urosepsis in the past.  Given the urosepsis possibility, the atrial fibrillation with rapid ventricular response and the non-sustained ventricular tachycardia we will have her admitted.   2:28 AM Dr. Myna Hidalgo accepts for admission to hospitalist service.   PROCEDURES  Procedures   ED DIAGNOSES     ICD-10-CM   1. Acute cystitis with hematuria  N30.01     2. Atrial fibrillation with rapid ventricular response (HCC)  I48.91     3. Ventricular tachycardia, non-sustained (HCC)  I47.2     4. Hypokalemia due to loss of potassium  E87.6          Sentoria Brent, MD 03/06/21 1211

## 2021-03-04 NOTE — Progress Notes (Signed)
Patient arrived from [facility] at 1848hrs.  Oriented to room and plan of care. Patient verbalized understanding.

## 2021-03-04 NOTE — Progress Notes (Signed)
PHARMACIST - PHYSICIAN COMMUNICATION  CONCERNING: P&T Medication Policy Regarding Oral Bisphosphonates  RECOMMENDATION: Your order for ibandronate (Boniva) been discontinued at this time.  If the patient's post-hospital medical condition warrants safe use of this class of drugs, please resume the pre-hospital regimen upon discharge.  DESCRIPTION:  Alendronate (Fosamax), ibandronate (Boniva), and risedronate (Actonel) can cause severe esophageal erosions in patients who are unable to remain upright at least 30 minutes after taking this medication.   Since brief interruptions in therapy are thought to have minimal impact on bone mineral density, the [facility] has established that bisphosphonate orders should be routinely discontinued during hospitalization.   To override this safety policy and permit administration of Boniva, Fosamax, or Actonel in the hospital, prescribers must write "DO NOT HOLD" in the comments section when placing the order for this class of medications.

## 2021-03-04 NOTE — H&P (Signed)
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  Of note, patient underwent a cystoscopy on 6/6 by urology for a hematuria evaluation.  Cystoscopy went without complication and was relatively unremarkable at that time.  Approximately 3 days ago, patient began to develop bouts of dysuria.  This dysuria was associated with nausea and lower abdominal pain.  Patient describes her lower abdominal pain as pressure-like in quality and moderate to severe in intensity.  Pain is nonradiating, worse with movement and improved with rest.  In the days that followed patient developed progressively worsening generalized weakness, discomfort, poor appetite and chills.  As patient symptoms worsen she also developed worsening nausea which then led to bouts of vomiting and diarrhea.  Patient denies recent travel sick contacts or confirmed contact with COVID-19 infection.  Due to progressively worsening symptoms the patient eventually presented to med center [facility] emergency department for evaluation.  Upon evaluation in the emergency department patient was found to be in atrial fibrillation and rapid ventricular response.  Patient was additionally found to have a urinalysis highly suggestive of urinary tract infection.  Patient was initiated on a diltiazem  infusion as well as intravenous ceftriaxone.  Due to concerns for a complicated urinary tract infection the hospitalist group was then called to assess the patient for admission to the hospital.  Review of Systems:   Review of Systems  Constitutional:  Positive for malaise/fatigue.  Gastrointestinal:  Positive for diarrhea, nausea and vomiting.  Genitourinary:  Positive for dysuria.  All other systems reviewed and are negative.  Past Medical History:  Diagnosis Date   A-fib ([place]) 11/15/2019   Abnormal liver function    Adenomatous polyp 12/04/2006   AKI (acute kidney injury) ([place])    Asthma    COPD (chronic obstructive pulmonary disease) ([place])    DM type 2 (diabetes mellitus, type 2) (HCC)    GERD (gastroesophageal reflux disease) 02/28/2012   Hemorrhoid 12/04/2006   Hyperlipidemia    Hypertension    Hypomagnesemia    Hypotension 09/21/2019   OSA (obstructive sleep apnea) 09/23/2014   NPSG 08/2014:  AHI 9/hr  But Completely intolerant of cpap.   - see chronic resp failure    Peripheral arterial disease ([place])    Persistent atrial fibrillation ([place]) 11/15/2019   Pulmonary hypertension ([place]) 03/28/2017   Echo 04/04/17 Compared to a prior study in 2015,   there is now moderate LVH and the LVEF is higher at 65-70%. No   obvious PFO noted by saline microbubble contrast. There is   moderate TR with an RVSP of 56 mmHg and a normal, collapsing IVC.   Consistent with moderate pulmonary hypertension.   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 Delight Ovens, adenomatous polyp   COLONOSCOPY  03/25/2012    Procedure: COLONOSCOPY;  Surgeon: Daneil Dolin, MD;  [facility];  Service: Endoscopy;  Laterality: N/A;  10:30   ESOPHAGOGASTRODUODENOSCOPY  11/03/2002   Dr. Gala Romney- normal exam- was done to check for possible foreign body   Fiberoptic bronchoscopy with endobronchial  ultrasound  10/22/2010   [place]   Right BKA  1990   RIGHT HEART CATHETERIZATION N/A 06/22/2014   Procedure: RIGHT HEART CATH;  Surgeon: Larey Dresser, MD;  Location: [facility] CATH LAB;  Service: Cardiovascular;  Laterality: N/A;     reports that she quit smoking about 30 years ago. Her smoking use included cigarettes. She has a 9.00 pack-year smoking history. She has never used smokeless tobacco. She reports that she does not drink alcohol and does not use drugs.  Allergies  Allergen Reactions   Meloxicam Other (See Comments)    Causes excess Fluid buildup    Family History  Problem Relation Age of Onset   COPD Sister    Diabetes Sister    Breast cancer Sister        Mastectomy   CVA Mother 43   Heart disease Mother    Diabetes Mother 54   Lung cancer Father        lung carcinoma   Colon cancer Neg Hx      Prior to Admission medications   Medication Sig Start Date End Date Taking? Authorizing Provider  apixaban (ELIQUIS) 5 MG TABS tablet Take 1 tablet (5 mg total) by mouth 2 (two) times daily. 09/24/19  Yes Florencia Reasons, MD  Calcium Carb-Cholecalciferol (CALCIUM 1000 + D PO) Take 1,000 mg by mouth daily.   Yes [provider]  Coenzyme Q10 400 MG CAPS Take 400 mg by mouth daily.    Yes [provider]  dexlansoprazole (DEXILANT) 60 MG capsule Take 1 capsule (60 mg total) by mouth daily. 09/29/15  Yes Tanda Rockers, MD  diltiazem (CARDIZEM CD) 240 MG 24 hr capsule Take 1 capsule (240 mg total) by mouth daily. 09/25/19  Yes Florencia Reasons, MD  dofetilide [facility]) 250 MCG capsule TAKE 1 CAPSULE BY MOUTH TWICE A DAY 09/12/20  Yes Evans Lance, MD  ezetimibe (ZETIA) 10 MG tablet Take 10 mg by mouth  every evening.  08/27/19  Yes [provider]  gabapentin (NEURONTIN) 100 MG capsule Take 100-200 mg by mouth See admin instructions. Take 200 mg in the morning and 100 mg at night   Yes [provider]  halobetasol (ULTRAVATE) 0.05 % cream Apply 1 application topically 2 (two) times daily as needed (psoriasis).  08/15/16  Yes [provider]  ibandronate (BONIVA) 150 MG tablet Take 150 mg by mouth every 30 (thirty) days. 02/15/20  Yes [provider]  Insulin Glargine (BASAGLAR KWIKPEN) 100 UNIT/ML SOPN Inject 45 Units into the skin daily.    Yes [provider]  Insulin Pen Needle 32G X 4 MM MISC USE TWICE DAILY AS DIRECTED 05/31/15  Yes [provider]  losartan (COZAAR) 100 MG tablet TAKE 1 TABLET BY MOUTH EVERY DAY 09/12/20  Yes Sherran Needs, NP  magnesium oxide (MAG-OX) 400 (241.3 Mg) MG tablet Take 1 tablet (400  mg total) by mouth 2 (two) times daily. Please make yearly appt with Dr. [name] for April 2022 for future refills. Thank you 1st attempt 10/18/20  Yes Evans Lance, MD  SitaGLIPtin-MetFORMIN HCl 50-1000 MG TB24 Take 1 tablet by mouth 2 (two) times daily. 01/10/14  Yes [provider]  tiZANidine (ZANAFLEX) 4 MG tablet Take 4 mg by mouth at bedtime. 11/04/19  Yes [provider]  VOLTAREN 1 % GEL Apply 2 g topically 4 (four) times daily as needed (pain).  08/13/17  Yes [provider]  acetaminophen (TYLENOL) 500 MG tablet Take 500 mg by mouth as needed for mild pain.     [provider]  BD PEN NEEDLE NANO U/F 32G X 4 MM MISC 2 (two) times daily. as directed 02/26/15   [provider]  fluticasone (FLONASE) 50 MCG/ACT nasal spray Place 1 spray into the nose daily as needed for allergies.     [provider]  furosemide (LASIX) 40 MG tablet Take 0.5 tablets (20 mg total) by mouth daily as needed for fluid or edema. 09/24/19 07/04/20  Florencia Reasons, MD  ibuprofen (ADVIL,MOTRIN) 200 MG  tablet Take 400 mg by mouth as needed for headache, mild pain or moderate pain.     [provider]  ketotifen (ZADITOR) 0.025 % ophthalmic solution Place 1 drop into both eyes daily as needed (dry eyes).    [provider]  lidocaine (LIDODERM) 5 % Place 1 patch onto the skin as needed. Remove & Discard patch within 12 hours or as directed by MD    [provider]  Lidocaine-Glycerin (PREPARATION H EX) Apply topically as needed.    [provider]  OXYGEN Place 3 L into the nose See admin instructions. 3 lpm with sleep and exertion  APS    [provider]  tamsulosin (FLOMAX) 0.4 MG CAPS capsule Take 0.4 mg by mouth daily. 12/08/20   [provider]    Physical Exam: Vitals:   03/04/21 1600 03/04/21 1700 03/04/21 1848 03/04/21 2024  BP: 117/60 (!) 112/57 116/62 (!) 125/55  Pulse: 73 79 98 73  Resp: 20 (!) 22 20 20   Temp:   98.3 F (36.8 C) 98.2 F (36.8 C)  TempSrc:   Oral Oral  SpO2: 96% 96% 100% 94%  Weight:   91.4 kg   Height:   5\' 8"  (1.727 m)     Constitutional: Awake alert and oriented x3, no associated distress.   Skin: no rashes, no lesions, poor skin turgor noted. Eyes: Pupils are equally reactive to light.  No evidence of scleral icterus or conjunctival pallor.  ENMT: Very dry mucous membranes noted.  Posterior pharynx clear of any exudate or lesions.   Neck: normal, supple, no masses, no thyromegaly.  No evidence of jugular venous distension.   Respiratory: clear to auscultation bilaterally, no wheezing, no crackles. Normal respiratory effort. No accessory muscle use.  Cardiovascular: Regular rate and rhythm, no murmurs / rubs / gallops. No extremity edema. 2+ pedal pulses. No carotid bruits.  Chest:   Nontender without crepitus or deformity.   Back:   Nontender without crepitus or deformity. Abdomen: Abdomen is soft with notable lower abdominal tenderness.  No evidence of intra-abdominal masses.  Positive bowel sounds  noted in all quadrants.   Musculoskeletal: Evidence of right BKA.   Good ROM, no contractures. Normal muscle tone.  Neurologic: CN 2-12 grossly intact. Sensation intact.  Patient moving all 4 extremities spontaneously.  Patient is following  all commands.  Patient is responsive to verbal stimuli.   Psychiatric: Patient exhibits normal mood with appropriate affect.  Patient seems to possess insight as to their current situation.     Labs on Admission: I have personally reviewed following labs and imaging studies -   CBC: Recent Labs  Lab 03/03/21 2343  WBC 8.9  NEUTROABS 8.5*  HGB 10.3*  HCT 32.9*  MCV 77.6*  PLT 993   Basic Metabolic Panel: Recent Labs  Lab 03/03/21 2343  NA 140  K 3.3*  CL 105  CO2 22  GLUCOSE 105*  BUN 14  CREATININE 0.73  CALCIUM 9.2  MG 1.3*   GFR: Estimated Creatinine Clearance: 71.8 mL/min (by C-G formula based on SCr of 0.73 mg/dL). Liver Function Tests: No results for input(s): AST, ALT, ALKPHOS, BILITOT, PROT, ALBUMIN in the last 168 hours. No results for input(s): LIPASE, AMYLASE in the last 168 hours. No results for input(s): AMMONIA in the last 168 hours. Coagulation Profile: No results for input(s): INR, PROTIME in the last 168 hours. Cardiac Enzymes: No results for input(s): CKTOTAL, CKMB, CKMBINDEX, TROPONINI in the last 168 hours. BNP (last 3 results) No results for input(s): PROBNP in the last 8760 hours. HbA1C: No results for input(s): HGBA1C in the last 72 hours. CBG: Recent Labs  Lab 03/04/21 0729 03/04/21 0937 03/04/21 1224 03/04/21 1546 03/04/21 1708  GLUCAP 139* 140* 143* 104* 199*   Lipid Profile: No results for input(s): CHOL, HDL, LDLCALC, TRIG, CHOLHDL, LDLDIRECT in the last 72 hours. Thyroid Function Tests: No results for input(s): TSH, T4TOTAL, FREET4, T3FREE, THYROIDAB in the last 72 hours. Anemia Panel: No results for input(s): VITAMINB12, FOLATE, FERRITIN, TIBC, IRON, RETICCTPCT in the last 72 hours. Urine  analysis:    Component Value Date/Time   COLORURINE ORANGE (A) 03/04/2021 0005   APPEARANCEUR HAZY (A) 03/04/2021 0005   LABSPEC 1.018 03/04/2021 0005   PHURINE 5.5 03/04/2021 0005   GLUCOSEU NEGATIVE 03/04/2021 0005   HGBUR LARGE (A) 03/04/2021 0005   BILIRUBINUR SMALL (A) 03/04/2021 0005   KETONESUR NEGATIVE 03/04/2021 0005   PROTEINUR 30 (A) 03/04/2021 0005   NITRITE POSITIVE (A) 03/04/2021 0005   LEUKOCYTESUR MODERATE (A) 03/04/2021 0005    Radiological Exams on Admission - Personally Reviewed: DG Chest Port 1 View  Result Date: 03/04/2021 CLINICAL DATA:  Shortness of breath, nausea, vomiting, diarrhea, body aches, and urinary symptoms. EXAM: PORTABLE CHEST 1 VIEW COMPARISON:  06/29/2020 FINDINGS: Heart size and pulmonary vascularity are normal. Interstitial changes in the lungs similar to prior study, likely chronic. No airspace disease or consolidation. No pleural effusions. No pneumothorax. Mediastinal contours appear intact. Calcification of the aorta. IMPRESSION: Chronic interstitial changes in the lungs. No evidence of active pulmonary disease. Electronically Signed   By: Lucienne Capers M.D.   On: 03/04/2021 01:28    EKG: Personally reviewed.  Rhythm is normal sinus rhythm with heart rate of 81 bpm.  No dynamic ST segment changes appreciated.  Assessment/Plan Principal Problem:   Atrial fibrillation with RVR (HCC) Rate control much improved upon arrival to the medical floor. Patient is being transitioned over to home regimen of oral diltiazem therapy as well as home regimen of Tikosyn Continue to monitor patient on telemetry Echocardiogram ordered for the morning Obtaining repeat magnesium and potassium and will replace as necessary. Continue home regimen of Eliquis for anticoagulation.  Active Problems:   Urinary tract infection associated with catheterization of urinary tract, initial encounter ([place])  Urinary tract infection likely related to  instrumentation from  recent cystoscopy Most recent urinary tract infection had urine culture growing out E. coli sensitive to ceftriaxone and therefore we have initiated the patient on intravenous ceftriaxone for empiric therapy. Will await final urine culture results and de-escalate therapy from there.     Nonsustained ventricular tachycardia ([place])  Patient experienced a run of nonsustained ventricular tachycardia at med center [facility] Likely secondary to significant electrolyte disturbances which are being corrected Monitoring patient on telemetry Continue to manage atrial fibrillation as above.    Type 2 diabetes mellitus with diabetic polyneuropathy, with long-term current use of insulin (HCC)  Accu-Cheks before every meal and nightly with sliding scale insulin Placing patient on reduced regimen of home basal insulin therapy due to tenuous oral intake Hemoglobin A1c ordered    Essential hypertension  Continue home regimen of oral antihypertensive therapy    Chronic respiratory failure with hypoxia (HCC)  Continue home regimen of supplemental oxygen    Hypomagnesemia  Magnesium 1.3 on arrival Replacement with magnesium Monitoring magnesium levels with serial chemistries.    Hypokalemia  Potassium of 3.3 on arrival Replacement therapy initiated Concurrently replacing magnesium levels Monitoring potassium levels with serial chemistries.    Mixed diabetic hyperlipidemia associated with type 2 diabetes mellitus ([place])  Continue home regimen of Zetia    GERD (gastroesophageal reflux disease)  Continuing home regimen of daily PPI therapy.    COPD (chronic obstructive pulmonary disease) (HCC)  No evidence of COPD exacerbation at this time As needed bronchodilator therapy for shortness of breath and wheezing.   Code Status:  Full code Family Communication: deferred   Status is: Observation  The patient remains OBS appropriate and will d/c before 2 midnights.  Dispo: The patient is  from: Home              Anticipated d/c is to: Home              Patient currently is not medically stable to d/c.   Difficult to place patient No        Vernelle Emerald MD [facility] Pager [phone]  If 7PM-7AM, please contact night-coverage [email or web address] Use universal [place] password for that web site. If you do not have the password, please call the hospital operator.  03/04/2021, 8:45 PM

## 2021-03-04 NOTE — ED Notes (Signed)
Consult placed to hospitalist

## 2021-03-05 ENCOUNTER — Observation Stay (HOSPITAL_BASED_OUTPATIENT_CLINIC_OR_DEPARTMENT_OTHER): Payer: Medicare Other

## 2021-03-05 DIAGNOSIS — I4891 Unspecified atrial fibrillation: Secondary | ICD-10-CM | POA: Diagnosis not present

## 2021-03-05 DIAGNOSIS — J449 Chronic obstructive pulmonary disease, unspecified: Secondary | ICD-10-CM | POA: Diagnosis not present

## 2021-03-05 DIAGNOSIS — I10 Essential (primary) hypertension: Secondary | ICD-10-CM | POA: Diagnosis not present

## 2021-03-05 DIAGNOSIS — Z89511 Acquired absence of right leg below knee: Secondary | ICD-10-CM

## 2021-03-05 DIAGNOSIS — J9611 Chronic respiratory failure with hypoxia: Secondary | ICD-10-CM | POA: Diagnosis not present

## 2021-03-05 DIAGNOSIS — T83511A Infection and inflammatory reaction due to indwelling urethral catheter, initial encounter: Secondary | ICD-10-CM | POA: Diagnosis not present

## 2021-03-05 LAB — COMPREHENSIVE METABOLIC PANEL
ALT: 15 U/L (ref 0–44)
AST: 17 U/L (ref 15–41)
Albumin: 2.7 g/dL — ABNORMAL LOW (ref 3.5–5.0)
Alkaline Phosphatase: 48 U/L (ref 38–126)
Anion gap: 8 (ref 5–15)
BUN: 17 mg/dL (ref 8–23)
CO2: 26 mmol/L (ref 22–32)
Calcium: 8.6 mg/dL — ABNORMAL LOW (ref 8.9–10.3)
Chloride: 104 mmol/L (ref 98–111)
Creatinine, Ser: 0.84 mg/dL (ref 0.44–1.00)
GFR, Estimated: >60 mL/min (ref >60)
Glucose, Bld: 112 mg/dL — ABNORMAL HIGH (ref 70–99)
Potassium: 4 mmol/L (ref 3.5–5.1)
Sodium: 138 mmol/L (ref 135–145)
Total Bilirubin: 0.7 mg/dL (ref 0.3–1.2)
Total Protein: 5.6 g/dL — ABNORMAL LOW (ref 6.5–8.1)

## 2021-03-05 LAB — CBC WITH DIFFERENTIAL/PLATELET
Abs Immature Granulocytes: 0.06 10*3/uL (ref 0.00–0.07)
Basophils Absolute: 0 10*3/uL (ref 0.0–0.1)
Basophils Relative: 0 %
Eosinophils Absolute: 0.3 10*3/uL (ref 0.0–0.5)
Eosinophils Relative: 2 %
HCT: 26.6 % — ABNORMAL LOW (ref 36.0–46.0)
Hemoglobin: 8.1 g/dL — ABNORMAL LOW (ref 12.0–15.0)
Immature Granulocytes: 1 %
Lymphocytes Relative: 14 %
Lymphs Abs: 1.6 10*3/uL (ref 0.7–4.0)
MCH: 24.2 pg — ABNORMAL LOW (ref 26.0–34.0)
MCHC: 30.5 g/dL (ref 30.0–36.0)
MCV: 79.4 fL — ABNORMAL LOW (ref 80.0–100.0)
Monocytes Absolute: 1 10*3/uL (ref 0.1–1.0)
Monocytes Relative: 9 %
Neutro Abs: 8.9 10*3/uL — ABNORMAL HIGH (ref 1.7–7.7)
Neutrophils Relative %: 74 %
Platelets: 216 10*3/uL (ref 150–400)
RBC: 3.35 MIL/uL — ABNORMAL LOW (ref 3.87–5.11)
RDW: 15.7 % — ABNORMAL HIGH (ref 11.5–15.5)
WBC: 11.9 10*3/uL — ABNORMAL HIGH (ref 4.0–10.5)
nRBC: 0 % (ref 0.0–0.2)

## 2021-03-05 LAB — CBC
HCT: 29.6 % — ABNORMAL LOW (ref 36.0–46.0)
Hemoglobin: 9.1 g/dL — ABNORMAL LOW (ref 12.0–15.0)
MCH: 24.1 pg — ABNORMAL LOW (ref 26.0–34.0)
MCHC: 30.7 g/dL (ref 30.0–36.0)
MCV: 78.5 fL — ABNORMAL LOW (ref 80.0–100.0)
Platelets: 236 10*3/uL (ref 150–400)
RBC: 3.77 MIL/uL — ABNORMAL LOW (ref 3.87–5.11)
RDW: 15.4 % (ref 11.5–15.5)
WBC: 12.7 10*3/uL — ABNORMAL HIGH (ref 4.0–10.5)
nRBC: 0 % (ref 0.0–0.2)

## 2021-03-05 LAB — ECHOCARDIOGRAM COMPLETE
AR max vel: 2.02 cm2
AV Area VTI: 2.28 cm2
AV Area mean vel: 2.04 cm2
AV Mean grad: 4 mmHg
AV Peak grad: 7.7 mmHg
Ao pk vel: 1.39 m/s
Height: 68 in
S' Lateral: 2.4 cm
Weight: 3224.01 oz

## 2021-03-05 LAB — HEMOGLOBIN A1C
Hgb A1c MFr Bld: 6.5 % — ABNORMAL HIGH (ref 4.8–5.6)
Mean Plasma Glucose: 140 mg/dL

## 2021-03-05 LAB — MAGNESIUM: Magnesium: 1.8 mg/dL (ref 1.7–2.4)

## 2021-03-05 LAB — GLUCOSE, CAPILLARY
Glucose-Capillary: 124 mg/dL — ABNORMAL HIGH (ref 70–99)
Glucose-Capillary: 139 mg/dL — ABNORMAL HIGH (ref 70–99)

## 2021-03-05 MED ORDER — ALBUTEROL SULFATE HFA 108 (90 BASE) MCG/ACT IN AERS
2.0000 | INHALATION_SPRAY | RESPIRATORY_TRACT | Status: DC | PRN
  Filled 2021-03-05: qty 6.7

## 2021-03-05 MED ORDER — MAGNESIUM SULFATE 2 GM/50ML IV SOLN
2.0000 g | Freq: Once | INTRAVENOUS | Status: AC
  Administered 2021-03-05: 2 g via INTRAVENOUS
  Filled 2021-03-05: qty 50

## 2021-03-05 MED ORDER — PERFLUTREN LIPID MICROSPHERE
1.0000 mL | INTRAVENOUS | Status: AC | PRN
  Administered 2021-03-05: 3 mL via INTRAVENOUS
  Filled 2021-03-05: qty 10

## 2021-03-05 MED ORDER — CEPHALEXIN 500 MG PO CAPS: 500.0000 mg | ORAL_CAPSULE | Freq: Two times a day (BID) | ORAL | 0 refills | Status: AC

## 2021-03-05 MED ORDER — TRAMADOL HCL 50 MG PO TABS
50.0000 mg | ORAL_TABLET | Freq: Two times a day (BID) | ORAL | Status: DC | PRN
  Administered 2021-03-05: 50 mg via ORAL
  Filled 2021-03-05: qty 1

## 2021-03-05 NOTE — Discharge Summary (Signed)
Physician Discharge Summary  Debbie [name] MGN:[id number] DOB: 1946/02/21 DOA: 03/03/2021  PCP: [name], MD  Admit date: 03/03/2021 Discharge date: 03/05/2021  Admitted From: Home Disposition: Home   Recommendations for Outpatient Follow-up:  Follow up with PCP in 1-2 weeks Follow up with urology for continued management of hematuria and recurrent UTIs.  Follow up with cardiology per routine after admission for AFib with RVR that improved with treatment of UTI. Follow up urine culture after discharge (100k GNRs without speciation at this time).  Home Health: None Equipment/Devices: None new Discharge Condition: Stable CODE STATUS: Full Diet recommendation: Heart healthy, carb-modified  Brief/Interim Summary: Debbie [name] is a 75 y.o. female with a history of 3L O2-dependent chronic respiratory failure, COPD, PAF, T2DM, right BKA (1990), chronic HFpEF, HTN, RA, Reynaud's phenomenon, recent recurrent UTIs, and cystoscopy on 6/6 for hematuria investigation who presented to [place] ED on 6/18 with dysuria, lower abdominal pain associated with weakness, chills. She was found to be in AFib with RVR with grossly positive urinalysis. Dilatiazem infusion was started, ceftriaxone given, and admission requested. The patient remained in the ED there until late last night when she arrived to [facility] and heart rate was controlled having transitioned back to oral diltiazem and tikosyn. She feels much improved from admission and requesting discharge. See below for full details.   Discharge Diagnoses:  Principal Problem:   Atrial fibrillation with RVR (HCC) Active Problems:   Type 2 diabetes mellitus with diabetic polyneuropathy, with long-term current use of insulin (HCC)   COPD (chronic obstructive pulmonary disease) (HCC)   Essential hypertension   GERD (gastroesophageal reflux disease)   Chronic respiratory failure with hypoxia (HCC)   Hypomagnesemia   Hx of right BKA (HCC)    Urinary tract infection associated with catheterization of urinary tract, initial encounter ([place])   Hypokalemia   Mixed diabetic hyperlipidemia associated with type 2 diabetes mellitus ([place])   Nonsustained ventricular tachycardia (HCC)  Paroxysmal atrial fibrillation with RVR: POA, improved with diltiazem infusion and remains within goal range on oral medications. RVR provoked by infection and improving with treatments. No changes to home medications at discharge.  NSVT: Resolved.   UTI: Possibly related to recent cystoscopy. GNRs growing on culture without speciation at this time. Last culture with E. coli.  - Given improvement in symptoms with cephalosporin, will Rx keflex 500mg  po BID x7 days at discharge.  - I'll follow up culture results and contact patient if her infection is resistant to this antibiotic.   Hematuria: No gross hematuria at this time.  - Cytology reportedly pending from cystoscopy per pt's urologist out of [facility] system who will follow up with the patient.  - With concomitant bleeding and CHF, would avoid NSAIDs.   Chronic blood loss anemia: Hgb shown to have stabilized on day of discharge without gross hematuria at this time.  - Follow up with PCP - Continued on eliquis for now without gross hematuria.  Chronic HFpEF: Appears euvolemic at DC, was volume down on admission, improved with IVF. Echo reassuring this admission.  T2DM: No changes to home medications HTN: No changes to home medications COPD, chronic hypoxic respiratory failure: No changes to home medications Hypomagnesemia, hypokalemia: Supplemented.  GERD: No changes to home medications HLD: No changes to home medications Obesity: Estimated body mass index is 30.64 kg/m as calculated from the following:   Height as of this encounter: 5\' 8"  (1.727 m).   Weight as of this encounter: 91.4 kg.  Discharge Instructions Discharge  Instructions     Diet - low sodium heart healthy   Complete by: As directed     Discharge instructions   Complete by: As directed    You were evaluated for rapid AFib due to UTI which has improved. No home medications need to be changed at discharge, though you will need to complete a course of antibiotics with keflex twice daily for 7 days. You will be contacted if the urine culture reveals an infection that is resistant to this antibiotic.   If your symptoms return/worsen, seek medical attention right away. Otherwise follow up with your PCP in 1-2 weeks and follow up with urology as previously planned for ongoing work up. If you notice gross bleeding in your urine, contact your urologist right away as well.   Increase activity slowly   Complete by: As directed       Allergies as of 03/05/2021       Reactions   Meloxicam Other (See Comments)   Causes excess Fluid buildup        Medication List     STOP taking these medications    ibuprofen 200 MG tablet Commonly known as: ADVIL       TAKE these medications    acetaminophen 500 MG tablet Commonly known as: TYLENOL Take 500 mg by mouth as needed for mild pain.   apixaban 5 MG Tabs tablet Commonly known as: ELIQUIS Take 1 tablet (5 mg total) by mouth 2 (two) times daily.   Basaglar KwikPen 100 UNIT/ML Inject 45 Units into the skin daily.   BD Pen Needle Nano U/F 32G X 4 MM Misc Generic drug: Insulin Pen Needle 2 (two) times daily. as directed   Insulin Pen Needle 32G X 4 MM Misc USE TWICE DAILY AS DIRECTED   CALCIUM 1000 + D PO Take 1,000 mg by mouth daily.   cephALEXin 500 MG capsule Commonly known as: KEFLEX Take 1 capsule (500 mg total) by mouth 2 (two) times daily for 7 days.   Coenzyme Q10 400 MG Caps Take 400 mg by mouth daily.   dexlansoprazole 60 MG capsule Commonly known as: Dexilant Take 1 capsule (60 mg total) by mouth daily.   diltiazem 240 MG 24 hr capsule Commonly known as: CARDIZEM CD Take 1 capsule (240 mg total) by mouth daily.   dofetilide 250 MCG  capsule Commonly known as: TIKOSYN TAKE 1 CAPSULE BY MOUTH TWICE A DAY   ezetimibe 10 MG tablet Commonly known as: ZETIA Take 10 mg by mouth every evening.   fluticasone 50 MCG/ACT nasal spray Commonly known as: FLONASE Place 1 spray into the nose daily as needed for allergies.   furosemide 40 MG tablet Commonly known as: Lasix Take 0.5 tablets (20 mg total) by mouth daily as needed for fluid or edema.   gabapentin 100 MG capsule Commonly known as: NEURONTIN Take 100-200 mg by mouth See admin instructions. Take 200 mg in the morning and 100 mg at night   halobetasol 0.05 % cream Commonly known as: ULTRAVATE Apply 1 application topically 2 (two) times daily as needed (psoriasis).   ibandronate 150 MG tablet Commonly known as: BONIVA Take 150 mg by mouth every 30 (thirty) days.   ketotifen 0.025 % ophthalmic solution Commonly known as: ZADITOR Place 1 drop into both eyes daily as needed (dry eyes).   lidocaine 5 % Commonly known as: LIDODERM Place 1 patch onto the skin as needed. Remove & Discard patch within 12 hours or as directed by  MD   losartan 100 MG tablet Commonly known as: COZAAR TAKE 1 TABLET BY MOUTH EVERY DAY   magnesium oxide 400 (241.3 Mg) MG tablet Commonly known as: MAG-OX Take 1 tablet (400 mg total) by mouth 2 (two) times daily. Please make yearly appt with Dr. [name] for April 2022 for future refills. Thank you 1st attempt   OXYGEN Place 3 L into the nose See admin instructions. 3 lpm with sleep and exertion  APS   PREPARATION H EX Apply topically as needed.   SitaGLIPtin-MetFORMIN HCl 50-1000 MG Tb24 Take 1 tablet by mouth 2 (two) times daily.   tamsulosin 0.4 MG Caps capsule Commonly known as: FLOMAX Take 0.4 mg by mouth daily.   tiZANidine 4 MG tablet Commonly known as: ZANAFLEX Take 4 mg by mouth at bedtime.   Voltaren 1 % Gel Generic drug: diclofenac Sodium Apply 2 g topically 4 (four) times daily as needed (pain).         Follow-up Information     [name], MD Follow up.   Specialty: Family Medicine Contact information: [place] [address] [phone]         Larey Dresser, MD Follow up.   Specialty: Cardiology Contact information: [address] [place] [address] [phone]                Allergies  Allergen Reactions   Meloxicam Other (See Comments)    Causes excess Fluid buildup    Consultations: None  Procedures/Studies: DG Chest Port 1 View  Result Date: 03/04/2021 CLINICAL DATA:  Shortness of breath, nausea, vomiting, diarrhea, body aches, and urinary symptoms. EXAM: PORTABLE CHEST 1 VIEW COMPARISON:  06/29/2020 FINDINGS: Heart size and pulmonary vascularity are normal. Interstitial changes in the lungs similar to prior study, likely chronic. No airspace disease or consolidation. No pleural effusions. No pneumothorax. Mediastinal contours appear intact. Calcification of the aorta. IMPRESSION: Chronic interstitial changes in the lungs. No evidence of active pulmonary disease. Electronically Signed   By: Lucienne Capers M.D.   On: 03/04/2021 01:28   ECHOCARDIOGRAM COMPLETE  Result Date: 03/05/2021    ECHOCARDIOGRAM REPORT   Patient Name:   Debbie [name] [facility] Date of Exam: 03/05/2021 Medical Rec #:  [id number]         Height:       68.0 in Accession #:    [id number]        Weight:       201.5 lb Date of Birth:  1946-05-15         BSA:          2.050 m Patient Age:    65 years          BP:           138/62 mmHg Patient Gender: F                 HR:           73 bpm. Exam Location:  Inpatient Procedure: 2D Echo, Cardiac Doppler, Color Doppler and Intracardiac            Opacification Agent Indications:    Atrial fibrillation  History:        Patient has prior history of Echocardiogram examinations.                 Pulmonary HTN and COPD, Arrythmias:Atrial Fibrillation; Risk  Factors:Hypertension, Diabetes and  Dyslipidemia.  Sonographer:    Clayton Lefort RDCS (AE) Referring Phys: [id number] Sherryll Burger [facility]  Sonographer Comments: No subcostal window and patient is morbidly obese. Image acquisition challenging due to patient body habitus. IMPRESSIONS  1. Left ventricular ejection fraction, by estimation, is 60 to 65%. The left ventricle has normal function. The left ventricle has no regional wall motion abnormalities. There is severe concentric left ventricular hypertrophy. Left ventricular diastolic  parameters are consistent with Grade I diastolic dysfunction (impaired relaxation). There is incoordinate septal motion.  2. Right ventricular systolic function is normal. The right ventricular size is normal. There is severely elevated pulmonary artery systolic pressure.  3. Left atrial size was moderately dilated.  4. The mitral valve is grossly normal. No evidence of mitral valve regurgitation.  5. The aortic valve is tricuspid. Aortic valve regurgitation is not visualized.  6. The inferior vena cava is normal in size with greater than 50% respiratory variability, suggesting right atrial pressure of 3 mmHg. Comparison(s): No prior Echocardiogram. FINDINGS  Left Ventricle: Left ventricular ejection fraction, by estimation, is 60 to 65%. The left ventricle has normal function. The left ventricle has no regional wall motion abnormalities. Definity contrast agent was given IV to delineate the left ventricular  endocardial borders. The left ventricular internal cavity size was small. There is severe concentric left ventricular hypertrophy. Incoordinate septal motion. Left ventricular diastolic parameters are consistent with Grade I diastolic dysfunction (impaired relaxation). Indeterminate filling pressures. Right Ventricle: The right ventricular size is normal. No increase in right ventricular wall thickness. Right ventricular systolic function is normal. There is severely elevated pulmonary artery systolic pressure. The tricuspid  regurgitant velocity is 4.03 m/s, and with an assumed right atrial pressure of 3 mmHg, the estimated right ventricular systolic pressure is 69.6 mmHg. Left Atrium: Left atrial size was moderately dilated. Right Atrium: Right atrial size was normal in size. Pericardium: There is no evidence of pericardial effusion. Mitral Valve: The mitral valve is grossly normal. No evidence of mitral valve regurgitation. Tricuspid Valve: The tricuspid valve is grossly normal. Tricuspid valve regurgitation is mild. Aortic Valve: The aortic valve is tricuspid. Aortic valve regurgitation is not visualized. Aortic valve mean gradient measures 4.0 mmHg. Aortic valve peak gradient measures 7.7 mmHg. Aortic valve area, by VTI measures 2.28 cm. Pulmonic Valve: The pulmonic valve was normal in structure. Pulmonic valve regurgitation is not visualized. Aorta: The aortic root and ascending aorta are structurally normal, with no evidence of dilitation. Venous: The inferior vena cava is normal in size with greater than 50% respiratory variability, suggesting right atrial pressure of 3 mmHg. IAS/Shunts: No atrial level shunt detected by color flow Doppler. EKG: Rhythm strip during this exam demostrated normal sinus rhythm and premature atrial contractions.  LEFT VENTRICLE PLAX 2D LVIDd:         3.50 cm LVIDs:         2.40 cm LV PW:         1.80 cm LV IVS:        2.10 cm LVOT diam:     2.10 cm LV SV:         62 LV SV Index:   30 LVOT Area:     3.46 cm  RIGHT VENTRICLE             IVC RV Basal diam:  3.50 cm     IVC diam: 1.50 cm RV S prime:     10.40 cm/s LEFT ATRIUM  Index       RIGHT ATRIUM           Index LA diam:      4.00 cm 1.95 cm/m  RA Area:     21.50 cm LA Vol (A2C): 62.6 ml 30.53 ml/m RA Volume:   67.20 ml  32.78 ml/m LA Vol (A4C): 80.4 ml 39.21 ml/m  AORTIC VALVE AV Area (Vmax):    2.02 cm AV Area (Vmean):   2.04 cm AV Area (VTI):     2.28 cm AV Vmax:           139.00 cm/s AV Vmean:          98.300 cm/s AV VTI:             0.272 m AV Peak Grad:      7.7 mmHg AV Mean Grad:      4.0 mmHg LVOT Vmax:         80.90 cm/s LVOT Vmean:        58.000 cm/s LVOT VTI:          0.179 m LVOT/AV VTI ratio: 0.66  AORTA Ao Root diam: 2.90 cm Ao Asc diam:  2.90 cm TRICUSPID VALVE TR Peak grad:   65.0 mmHg TR Vmax:        403.00 cm/s  SHUNTS Systemic VTI:  0.18 m Systemic Diam: 2.10 cm Lyman Bishop MD Electronically signed by Lyman Bishop MD Signature Date/Time: 03/05/2021/11:51:31 AM    Final      Subjective: Feels much better, though feels stiff from sitting in the bed for so long, stiff/pain in shoulders is stable. Hungry but doesn't like our food. No chest pain or palpitations or dyspnea. Breathing at her baseline. No fever.    Discharge Exam: Vitals:   03/05/21 0808 03/05/21 1119  BP: 138/62 140/82  Pulse: 73 77  Resp: 16 18  Temp: 97.7 F (36.5 C) 97.9 F (36.6 C)  SpO2: 95% 94%   General: Pt is alert, awake, not in acute distress Cardiovascular: RRR, S1/S2 +, no rubs, no gallops Respiratory: CTA bilaterally, no wheezing, no rhonchi Abdominal: Soft, NT, ND, bowel sounds + Extremities: No edema, R BKA wnl.  Labs: BNP (last 3 results) Recent Labs    03/04/21 2010  BNP 384.6*   Basic Metabolic Panel: Recent Labs  Lab 03/03/21 2343 03/04/21 2010 03/05/21 0440  NA 140 135 138  K 3.3* 4.2 4.0  CL 105 104 104  CO2 22 26 26   GLUCOSE 105* 137* 112*  BUN 14 16 17   CREATININE 0.73 0.80 0.84  CALCIUM 9.2 8.6* 8.6*  MG 1.3* 1.8 1.8  PHOS  --  2.7  --    Liver Function Tests: Recent Labs  Lab 03/04/21 2010 03/05/21 0440  AST  --  17  ALT  --  15  ALKPHOS  --  48  BILITOT  --  0.7  PROT  --  5.6*  ALBUMIN 2.9* 2.7*   No results for input(s): LIPASE, AMYLASE in the last 168 hours. No results for input(s): AMMONIA in the last 168 hours. CBC: Recent Labs  Lab 03/03/21 2343 03/05/21 0440 03/05/21 1312  WBC 8.9 11.9* 12.7*  NEUTROABS 8.5* 8.9*  --   HGB 10.3* 8.1* 9.1*  HCT 32.9* 26.6* 29.6*   MCV 77.6* 79.4* 78.5*  PLT 286 216 236   Cardiac Enzymes: No results for input(s): CKTOTAL, CKMB, CKMBINDEX, TROPONINI in the last 168 hours. BNP: Invalid input(s): POCBNP CBG: Recent Labs  Lab  03/04/21 1546 03/04/21 1708 03/04/21 2141 03/05/21 0806 03/05/21 1118  GLUCAP 104* 199* 97 124* 139*   D-Dimer No results for input(s): DDIMER in the last 72 hours. Hgb A1c No results for input(s): HGBA1C in the last 72 hours. Lipid Profile No results for input(s): CHOL, HDL, LDLCALC, TRIG, CHOLHDL, LDLDIRECT in the last 72 hours. Thyroid function studies Recent Labs    03/04/21 2010  TSH 1.036   Anemia work up No results for input(s): VITAMINB12, FOLATE, FERRITIN, TIBC, IRON, RETICCTPCT in the last 72 hours. Urinalysis    Component Value Date/Time   COLORURINE ORANGE (A) 03/04/2021 0005   APPEARANCEUR HAZY (A) 03/04/2021 0005   LABSPEC 1.018 03/04/2021 0005   PHURINE 5.5 03/04/2021 0005   GLUCOSEU NEGATIVE 03/04/2021 0005   HGBUR LARGE (A) 03/04/2021 0005   BILIRUBINUR SMALL (A) 03/04/2021 0005   KETONESUR NEGATIVE 03/04/2021 0005   PROTEINUR 30 (A) 03/04/2021 0005   NITRITE POSITIVE (A) 03/04/2021 0005   LEUKOCYTESUR MODERATE (A) 03/04/2021 0005    Microbiology Recent Results (from the past 240 hour(s))  Resp Panel by RT-PCR (Flu A&B, Covid) Nasopharyngeal Swab     Status: None   Collection Time: 03/04/21 12:36 AM   Specimen: Nasopharyngeal Swab; Nasopharyngeal(NP) swabs in vial transport medium  Result Value Ref Range Status   SARS Coronavirus 2 by RT PCR NEGATIVE NEGATIVE Final    Comment: (NOTE) SARS-CoV-2 target nucleic acids are NOT DETECTED.  The SARS-CoV-2 RNA is generally detectable in upper respiratory specimens during the acute phase of infection. The lowest concentration of SARS-CoV-2 viral copies this assay can detect is 138 copies/mL. A negative result does not preclude SARS-Cov-2 infection and should not be used as the sole basis for treatment  or other patient management decisions. A negative result may occur with  improper specimen collection/handling, submission of specimen other than nasopharyngeal swab, presence of viral mutation(s) within the areas targeted by this assay, and inadequate number of viral copies(<138 copies/mL). A negative result must be combined with clinical observations, patient history, and epidemiological information. The expected result is Negative.  Fact Sheet for Patients:  EntrepreneurPulse.com.au  Fact Sheet for Healthcare Providers:  IncredibleEmployment.be  This test is no t yet approved or cleared by the [place] FDA and  has been authorized for detection and/or diagnosis of SARS-CoV-2 by FDA under an Emergency Use Authorization (EUA). This EUA will remain  in effect (meaning this test can be used) for the duration of the COVID-19 declaration under Section 564(b)(1) of the Act, 21 U.S.C.section 360bbb-3(b)(1), unless the authorization is terminated  or revoked sooner.       Influenza A by PCR NEGATIVE NEGATIVE Final   Influenza B by PCR NEGATIVE NEGATIVE Final    Comment: (NOTE) The Xpert Xpress SARS-CoV-2/FLU/RSV plus assay is intended as an aid in the diagnosis of influenza from Nasopharyngeal swab specimens and should not be used as a sole basis for treatment. Nasal washings and aspirates are unacceptable for Xpert Xpress SARS-CoV-2/FLU/RSV testing.  Fact Sheet for Patients: EntrepreneurPulse.com.au  Fact Sheet for Healthcare Providers: IncredibleEmployment.be  This test is not yet approved or cleared by the [place] FDA and has been authorized for detection and/or diagnosis of SARS-CoV-2 by FDA under an Emergency Use Authorization (EUA). This EUA will remain in effect (meaning this test can be used) for the duration of the COVID-19 declaration under Section 564(b)(1) of the Act, 21 U.S.C. section  360bbb-3(b)(1), unless the authorization is terminated or revoked.  Performed at [facility], [address], [place], [place] [address]   Urine culture     Status: Abnormal (Preliminary result)   Collection Time: 03/04/21 12:36 AM   Specimen: Urine, Random  Result Value Ref Range Status   Specimen Description   Final    URINE, RANDOM Performed at [facility], [address], [place], [place] [address]    Special Requests   Final    NONE Performed at [facility], [address] [place], [place] [address]    Culture >=100,000 COLONIES/mL GRAM NEGATIVE RODS (A)  Final   Report Status PENDING  Incomplete    Time coordinating discharge: Approximately 40 minutes  Patrecia Pour, MD  [facility] 03/05/2021, 2:44 PM

## 2021-03-05 NOTE — Evaluation (Signed)
Physical Therapy Evaluation Patient Details Name: Debbie [name] MRN: [id number] DOB: 1946/07/18 Today's Date: 03/05/2021   History of Present Illness  Pt is a 75 y/o female admitted 6/18 secondary to nausea/vomiting. Found to have UTI and a fib with RVR. PMH includes R BKA, HTN, DM, a fib, COPD on 3L, HTN, and RA.  Clinical Impression  Pt admitted secondary to problem above with deficits below. Pt requiring min guard A to stand and transfer to chair using RW. Pt reports she does not ambulate much outside of home and has a scooter and WC she can use at home. Reports her husband can assist if needed at d/c. Discussed HHPT, however, pt reporting she does not want any HHPT at d/c. Will continue to follow acutely.     Follow Up Recommendations No PT follow up (pt refusing HHPT)    Equipment Recommendations  None recommended by PT    Recommendations for Other Services       Precautions / Restrictions Precautions Precautions: Fall Precaution Comments: R BKA at baseline Required Braces or Orthoses: Other Brace Other Brace: R prosthetic Restrictions Weight Bearing Restrictions: No      Mobility  Bed Mobility Overal bed mobility: Needs Assistance Bed Mobility: Supine to Sit     Supine to sit: Supervision     General bed mobility comments: Supervision for safety.    Transfers Overall transfer level: Needs assistance Equipment used: Rolling walker (2 wheeled) Transfers: Stand Pivot Transfers;Sit to/from Stand Sit to Stand: Min guard Stand pivot transfers: Min guard       General transfer comment: Min guard for safety to stand and transfer to chair this session. VSS. Mild unsteadiness noted. Educated about using WC at home if needed.  Ambulation/Gait                Stairs            Wheelchair Mobility    Modified Rankin (Stroke Patients Only)       Balance Overall balance assessment: Needs assistance Sitting-balance support: No upper extremity  supported;Feet supported Sitting balance-Leahy Scale: Good     Standing balance support: Bilateral upper extremity supported;During functional activity Standing balance-Leahy Scale: Poor Standing balance comment: Reliant on UE support                             Pertinent Vitals/Pain Pain Assessment: Faces Faces Pain Scale: Hurts little more Pain Location: back and shoulders Pain Descriptors / Indicators: Aching;Sore    Home Living Family/patient expects to be discharged to:: Private residence Living Arrangements: Spouse/significant other Available Help at Discharge: Family;Available 24 hours/day Type of Home: House Home Access: Ramped entrance     Home Layout: One level Home Equipment: Shower seat;Cane - single point;Walker - 2 wheels;Youth worker - 4 wheels Additional Comments: on 3L of oxygen    Prior Function Level of Independence: Independent with assistive device(s)         Comments: Uses cane for ambulation outside     Hand Dominance        Extremity/Trunk Assessment   Upper Extremity Assessment Upper Extremity Assessment: Defer to OT evaluation    Lower Extremity Assessment Lower Extremity Assessment: RLE deficits/detail RLE Deficits / Details: R BKA at baseline    Cervical / Trunk Assessment Cervical / Trunk Assessment: Normal  Communication   Communication: No difficulties  Cognition Arousal/Alertness: Awake/alert Behavior During Therapy: WFL for tasks assessed/performed Overall Cognitive Status: Within Functional  Limits for tasks assessed                                        General Comments General comments (skin integrity, edema, etc.): Pt reports she feels she is close to baseline and does not want any HHPT services at follow up.    Exercises     Assessment/Plan    PT Assessment Patient needs continued PT services  PT Problem List Decreased strength;Decreased balance;Decreased activity  tolerance;Decreased mobility;Decreased knowledge of use of DME;Decreased knowledge of precautions       PT Treatment Interventions DME instruction;Gait training;Functional mobility training;Therapeutic activities;Therapeutic exercise;Balance training;Patient/family education    PT Goals (Current goals can be found in the Care Plan section)  Acute Rehab PT Goals Patient Stated Goal: to go home PT Goal Formulation: With patient Time For Goal Achievement: 03/19/21 Potential to Achieve Goals: Good    Frequency Min 3X/week   Barriers to discharge        Co-evaluation               AM-PAC PT "6 Clicks" Mobility  Outcome Measure Help needed turning from your back to your side while in a flat bed without using bedrails?: None Help needed moving from lying on your back to sitting on the side of a flat bed without using bedrails?: None Help needed moving to and from a bed to a chair (including a wheelchair)?: A Little Help needed standing up from a chair using your arms (e.g., wheelchair or bedside chair)?: A Little Help needed to walk in hospital room?: A Little Help needed climbing 3-5 steps with a railing? : A Lot 6 Click Score: 19    End of Session Equipment Utilized During Treatment: Gait belt Activity Tolerance: Patient tolerated treatment well Patient left: in chair;with call bell/phone within reach Nurse Communication: Mobility status PT Visit Diagnosis: Unsteadiness on feet (R26.81);Muscle weakness (generalized) (M62.81)    Time: 7824-2353 PT Time Calculation (min) (ACUTE ONLY): 21 min   Charges:   PT Evaluation $PT Eval Low Complexity: 1 Low          Lou Miner, DPT  Acute Rehabilitation Services  Pager: [phone] Office: [phone]   Rudean Hitt 03/05/2021, 1:48 PM

## 2021-03-06 LAB — URINE CULTURE: Culture: >=100000 — AB

## 2021-05-29 ENCOUNTER — Telehealth: Payer: Self-pay | Admitting: Internal Medicine

## 2021-05-29 NOTE — Telephone Encounter (Signed)
Dr. [name] advised patient to be added to his schedule next week.  Will forward to [facility] to add to his schedule.

## 2021-05-29 NOTE — Telephone Encounter (Signed)
Dr. Melford Aase requesting to speak with a triage nurse about the patient.

## 2021-05-29 NOTE — Telephone Encounter (Signed)
Spoke with Dr Melford Aase who has concerns re: pt's recurring UTI's with gross hematuria.  Hgb has dropped from 10.9 to 8.8.  Please contact Dr Melford Aase to discuss at [phone].

## 2021-05-30 NOTE — Telephone Encounter (Signed)
Pt is scheduled to see Dr. [name] next week in clinic on 9/21 at 1:45 pm. Pt made aware of appt date and time by EP [facility].

## 2021-06-06 ENCOUNTER — Ambulatory Visit (INDEPENDENT_AMBULATORY_CARE_PROVIDER_SITE_OTHER): Payer: Medicare Other | Admitting: Internal Medicine

## 2021-06-06 ENCOUNTER — Other Ambulatory Visit: Payer: Self-pay

## 2021-06-06 ENCOUNTER — Encounter: Payer: Self-pay | Admitting: Internal Medicine

## 2021-06-06 VITALS — BP 142/78 | HR 86 | Ht 68.0 in | Wt 207.6 lb

## 2021-06-06 DIAGNOSIS — I4891 Unspecified atrial fibrillation: Secondary | ICD-10-CM | POA: Diagnosis not present

## 2021-06-06 NOTE — Progress Notes (Signed)
HPI Debbie [name] returns today for followup of atrial fib. She is a pleasant 75 yo woman with a h/o atrial fib and underwent initiation of dofetilide over 2 years ago. In the interim, she has done reasonably well except she has developed hematuria which is intermittent.  She has no chest pain. Her dyspnea is improved in NSR. We had to reduce her dose of dofetilide from 500 to 250 due to QT prolongation.  Allergies  Allergen Reactions   Meloxicam Other (See Comments)    Causes excess Fluid buildup     Current Outpatient Medications  Medication Sig Dispense Refill   acetaminophen (TYLENOL) 500 MG tablet Take 500 mg by mouth every 6 (six) hours as needed for mild pain (or headaches).     apixaban (ELIQUIS) 5 MG TABS tablet Take 1 tablet (5 mg total) by mouth 2 (two) times daily. 60 tablet 0   BD PEN NEEDLE NANO U/F 32G X 4 MM MISC 2 (two) times daily. as directed  6   Calcium Carb-Cholecalciferol (CALCIUM 1000 + D PO) Take 1,000 mg by mouth daily.     Coenzyme Q10 400 MG CAPS Take 400 mg by mouth daily.      dexlansoprazole (DEXILANT) 60 MG capsule Take 1 capsule (60 mg total) by mouth daily.     diltiazem (CARDIZEM CD) 240 MG 24 hr capsule Take 1 capsule (240 mg total) by mouth daily. 30 capsule 0   diltiazem (TIAZAC) 240 MG 24 hr capsule Take 1 capsule by mouth daily.     dofetilide (TIKOSYN) 250 MCG capsule TAKE 1 CAPSULE BY MOUTH TWICE A DAY 180 capsule 3   estradiol (ESTRACE) 0.1 MG/GM vaginal cream Place vaginally.     ezetimibe (ZETIA) 10 MG tablet Take 10 mg by mouth every evening.      fluticasone (FLONASE) 50 MCG/ACT nasal spray Place 1 spray into both nostrils daily as needed for allergies.     gabapentin (NEURONTIN) 100 MG capsule Take 100-200 mg by mouth See admin instructions. Take 200 mg in the morning and 100 mg at night     halobetasol (ULTRAVATE) 0.05 % cream Apply 1 application topically 2 (two) times daily as needed (psoriasis).      ibandronate (BONIVA) 150 MG  tablet Take 150 mg by mouth every 30 (thirty) days.     Insulin Glargine (BASAGLAR KWIKPEN) 100 UNIT/ML SOPN Inject 45 Units into the skin daily before breakfast.     Insulin Pen Needle 32G X 4 MM MISC USE TWICE DAILY AS DIRECTED     JANUVIA 100 MG tablet Take 100 mg by mouth daily.     ketotifen (ZADITOR) 0.025 % ophthalmic solution Place 1 drop into both eyes daily as needed (for irritation).     lidocaine (LIDODERM) 5 % Place 1 patch onto the skin daily as needed (for pain- Remove & Discard patch within 12 hours or as directed by MD).     Lidocaine-Glycerin (PREPARATION H EX) Place 1 application rectally 2 (two) times daily as needed (for pain).     losartan (COZAAR) 100 MG tablet TAKE 1 TABLET BY MOUTH EVERY DAY 90 tablet 1   magnesium oxide (MAG-OX) 400 (241.3 Mg) MG tablet Take 1 tablet (400 mg total) by mouth 2 (two) times daily. Please make yearly appt with Dr. [name] for April 2022 for future refills. Thank you 1st attempt (Patient taking differently: Take 400 mg by mouth 2 (two) times daily.) 60 tablet 2   methenamine (  HIPREX) 1 g tablet Take 1 g by mouth 2 (two) times daily.     OXYGEN Inhale 3 L/min into the lungs continuous.     tamsulosin (FLOMAX) 0.4 MG CAPS capsule Take 0.4 mg by mouth daily.     tiZANidine (ZANAFLEX) 4 MG tablet Take 4 mg by mouth at bedtime.     VOLTAREN 1 % GEL Apply 2 g topically 4 (four) times daily as needed (for arthritic pain).  2   furosemide (LASIX) 40 MG tablet Take 0.5 tablets (20 mg total) by mouth daily as needed for fluid or edema. 30 tablet 0   No current facility-administered medications for this visit.     Past Medical History:  Diagnosis Date   A-fib ([place]) 11/15/2019   Abnormal liver function    Adenomatous polyp 12/04/2006   AKI (acute kidney injury) ([place])    Asthma    COPD (chronic obstructive pulmonary disease) ([place])    DM type 2 (diabetes mellitus, type 2) ([facility])    GERD (gastroesophageal reflux disease) 02/28/2012   Hemorrhoid  12/04/2006   Hyperlipidemia    Hypertension    Hypomagnesemia    Hypotension 09/21/2019   OSA (obstructive sleep apnea) 09/23/2014   NPSG 08/2014:  AHI 9/hr  But Completely intolerant of cpap.   - see chronic resp failure    Peripheral arterial disease ([place])    Persistent atrial fibrillation ([place]) 11/15/2019   Pulmonary hypertension ([place]) 03/28/2017   Echo 04/04/17 Compared to a prior study in 2015,   there is now moderate LVH and the LVEF is higher at 65-70%. No   obvious PFO noted by saline microbubble contrast. There is   moderate TR with an RVSP of 56 mmHg and a normal, collapsing IVC.   Consistent with moderate pulmonary hypertension.   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   Past Surgical History:  Procedure Laterality Date   CARDIOVERSION N/A 10/18/2019   Procedure: CARDIOVERSION;  Surgeon: [name], MD;  Location: [facility];  Service: Cardiovascular;  Laterality: N/A;   CHOLECYSTECTOMY     COLONOSCOPY  12/03/2006   Dr. Delight Ovens, adenomatous polyp   COLONOSCOPY  03/25/2012   Procedure: COLONOSCOPY;  Surgeon: Daneil Dolin, MD;  [facility];  Service: Endoscopy;  Laterality: N/A;  10:30   ESOPHAGOGASTRODUODENOSCOPY  11/03/2002   Dr. Gala Romney- normal exam- was done to check for possible foreign body   Fiberoptic bronchoscopy with endobronchial  ultrasound  10/22/2010   [place]   Right BKA  1990   RIGHT HEART CATHETERIZATION N/A 06/22/2014   Procedure: RIGHT HEART CATH;  Surgeon: Larey Dresser, MD;  Location: [facility] CATH LAB;  Service: Cardiovascular;  Laterality: N/A;     Family History  Problem Relation Age of Onset   COPD Sister    Diabetes Sister    Breast cancer Sister        Mastectomy   CVA Mother 58   Heart disease Mother    Diabetes Mother 53    Lung cancer Father        lung carcinoma   Colon cancer Neg Hx      Social History   Socioeconomic History   Marital status: Married  Spouse name: Debbie [name]   Number of children: 3   Years of education: Not on file   Highest education level: Not on file  Occupational History   Occupation: retired    Fish farm manager: UNEMPLOYED  Tobacco Use   Smoking status: Former    Packs/day: 0.50    Years: 18.00    Pack years: 9.00    Types: Cigarettes    Quit date: 09/16/1990    Years since quitting: 30.7   Smokeless tobacco: Never  Substance and Sexual Activity   Alcohol use: No    Alcohol/week: 0.0 standard drinks   Drug use: No   Sexual activity: Not on file  Other Topics Concern   Not on file  Social History Narrative   Not on file   Social Determinants of Health   Financial Resource Strain: Not on file  Food Insecurity: Not on file  Transportation Needs: Not on file  Physical Activity: Not on file  Stress: Not on file  Social Connections: Not on file  Intimate Partner Violence: Not on file     BP (!) 142/78   Pulse 86   Ht 5\' 8"  (1.727 m)   Wt 207 lb 9.6 oz (94.2 kg)   SpO2 96%   BMI 31.57 kg/m   Physical Exam:  Well appearing but pbese wp,am. NAD HEENT: Unremarkable Neck:  No JVD, no thyromegally Lymphatics:  No adenopathy Back:  No CVA tenderness Lungs:  Clear with no wheezes HEART:  Regular rate rhythm, no murmurs, no rubs, no clicks Abd:  soft, positive bowel sounds, no organomegally, no rebound, no guarding Ext:  2 plus pulses, no edema, no cyanosis, no clubbing Skin:  No rashes no nodules Neuro:  CN II through XII intact, motor grossly intact  EKG - nsr  DEVICE  Normal device function.  See PaceArt for details.   Assess/Plan:  1. PAF - she is maintaining NSR. No change in dofetilide. 2. HTN - her bp is high but she notes it is ok at home. White coat HTN. 3. COPD - she is on home oxygen.  4. coags - she has been on eliquis and has had  hematuria. I would recommend she continue the eliquis and followup in a year. She needs urology followup. She notes that the hematuria is currently absent. Her stroke risk with PAF off of eliquis would be increased.  Debbie Overlie Eliu Batch,MD

## 2021-06-06 NOTE — Patient Instructions (Signed)
Medication Instructions:  Your physician recommends that you continue on your current medications as directed. Please refer to the Current Medication list given to you today. *If you need a refill on your cardiac medications before your next appointment, please call your pharmacy*   Lab Work: none If you have labs (blood work) drawn today and your tests are completely normal, you will receive your results only by: [place] (if you have MyChart) OR A paper copy in the mail If you have any lab test that is abnormal or we need to change your treatment, we will call you to review the results.   Testing/Procedures: none   Follow-Up: At [facility], you and your health needs are our priority.  As part of our continuing mission to provide you with exceptional heart care, we have created designated Provider Care Teams.  These Care Teams include your primary Cardiologist (physician) and Advanced Practice Providers (APPs -  Physician Assistants and Nurse Practitioners) who all work together to provide you with the care you need, when you need it.  We recommend signing up for the patient portal called "MyChart".  Sign up information is provided on this After Visit Summary.  MyChart is used to connect with patients for Virtual Visits (Telemedicine).  Patients are able to view lab/test results, encounter notes, upcoming appointments, etc.  Non-urgent messages can be sent to your provider as well.   To learn more about what you can do with MyChart, go to [email or web address].    Your next appointment:   1 year(s)  The format for your next appointment:   In Person  Provider:   Cristopher Peru, MD   Other Instructions

## 2021-06-08 ENCOUNTER — Other Ambulatory Visit (HOSPITAL_COMMUNITY): Payer: Self-pay | Admitting: Nurse Practitioner

## 2021-06-18 ENCOUNTER — Other Ambulatory Visit: Payer: Self-pay

## 2021-06-18 ENCOUNTER — Ambulatory Visit (INDEPENDENT_AMBULATORY_CARE_PROVIDER_SITE_OTHER): Payer: Medicare Other | Admitting: Internal Medicine

## 2021-06-18 ENCOUNTER — Encounter: Payer: Self-pay | Admitting: Internal Medicine

## 2021-06-18 DIAGNOSIS — R319 Hematuria, unspecified: Secondary | ICD-10-CM | POA: Diagnosis not present

## 2021-06-18 DIAGNOSIS — J9611 Chronic respiratory failure with hypoxia: Secondary | ICD-10-CM

## 2021-06-18 DIAGNOSIS — I272 Pulmonary hypertension, unspecified: Secondary | ICD-10-CM | POA: Diagnosis not present

## 2021-06-18 NOTE — Progress Notes (Signed)
Subjective:     Patient ID: Debbie [name], female   DOB: [birth year]     MRN: [id number]    Brief patient profile:  11   yowf quit smoking 1990 with ? pna in 2011/2012  [place] all acute changes resolved p rx for pna but ever since has required 02 at hs/ and with activity and followed previously by Dr [name] for chronic resp failure but no airflow obst on spirometry    History of Present Illness  09/29/2015 1st  office visit/ Weston Fulco  Transition of care  Chief Complaint  Patient presents with   Follow-up    Former Dr [name] pt. Breathing is unchanged. She does notice it gets worse with colder weather.   sob bending over / struggles to walk eg HT due to R leg prosthesis and doe  So has used scooter x years rec No change in recommendations for 02  Try to keep your weight trending down if at all possible to help your lower lobes get better airflow  Please schedule a follow up visit in 12 months but call sooner if needed    04/04/17 echo Compared to a prior study in 2015,   there is now moderate LVH and the LVEF is higher at 65-70%. No   obvious PFO noted by saline microbubble contrast. There is   moderate TR with an RVSP of 56 mmHg and a normal, collapsing IVC.   Consistent with moderate pulmonary hypertension.     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 She is using her o2 3lpm 24/7- checks sats with exertion at times and it reads above 90%3lpm pulsed.   Dyspnea:  100 ft / does loop at home x 5 loops and sats / also by limited by stump pain R BKA  Cough: none  Sleeping: flat /one pillow  SABA use: none 02: 3lpm bedtime, none sitting and up to 3lpm Rec Make sure you check your oxygen saturation  at your highest level of activity   Follow up in Oct 2022   06/18/2021  f/u ov/Kaiden Pech re: 02 dep resp failure p ALI from pna 2012 c/b mod WHO 3 PH   maint on 02/freq uti's   and  Macrodantin exp but not since 09/08/2019 per Epic records  Chief Complaint  Patient presents with   Follow-up    Some labored breathing  Dyspnea:  50 ft / mostly uses scooter to go anywhere  Cough: none  Sleeping: flat bed one pillow no am ha /ams feels she sleeps fine SABA use: none  02: 3lpm cont and the 4lpm POC but not checking sats  Covid status:   total 2 x    No obvious day to day or daytime variability or assoc excess/ purulent sputum or mucus plugs or hemoptysis or cp or chest tightness, subjective wheeze or overt sinus or hb symptoms.   Sleeping as above  without nocturnal  or early am exacerbation  of respiratory  c/o's or need for noct saba. Also denies any obvious fluctuation of symptoms with weather or environmental changes or other aggravating or alleviating factors except as outlined above   No unusual exposure hx or h/o childhood pna/ asthma or knowledge of premature birth.  Current Allergies, Complete Past Medical History, Past Surgical History, Family History, and Social History were reviewed in [employer] record.  ROS  The following are not active complaints unless bolded Hoarseness, sore throat, dysphagia, dental problems, itching, sneezing,  nasal congestion or discharge of excess mucus or purulent secretions, ear ache,   fever, chills, sweats, unintended wt loss or wt gain, classically pleuritic or exertional cp,  orthopnea pnd or arm/hand swelling  or leg swelling, presyncope, palpitations, abdominal pain, anorexia, nausea, vomiting, diarrhea  or change in bowel habits or change in bladder habits, change in stools or change in urine(intermittently bloody on eliquis 5 mg bid) , dysuria, hematuria,  rash, arthralgias, visual complaints, headache, numbness, weakness or ataxia or problems with walking or coordination,  change in mood or  memory.        Current Meds  Medication Sig   acetaminophen (TYLENOL) 500 MG tablet Take 500 mg by mouth  every 6 (six) hours as needed for mild pain (or headaches).   apixaban (ELIQUIS) 5 MG TABS tablet Take 1 tablet (5 mg total) by mouth 2 (two) times daily.   BD PEN NEEDLE NANO U/F 32G X 4 MM MISC 2 (two) times daily. as directed   Calcium Carb-Cholecalciferol (CALCIUM 1000 + D PO) Take 1,000 mg by mouth daily.   Coenzyme Q10 400 MG CAPS Take 400 mg by mouth daily.    dexlansoprazole (DEXILANT) 60 MG capsule Take 1 capsule (60 mg total) by mouth daily.   diltiazem (CARDIZEM CD) 240 MG 24 hr capsule Take 1 capsule (240 mg total) by mouth daily.   diltiazem (TIAZAC) 240 MG 24 hr capsule Take 1 capsule by mouth daily.   dofetilide (TIKOSYN) 250 MCG capsule TAKE 1 CAPSULE BY MOUTH TWICE A DAY   estradiol (ESTRACE) 0.1 MG/GM vaginal cream Place vaginally.   ezetimibe (ZETIA) 10 MG tablet Take 10 mg by mouth every evening.    fluticasone (FLONASE) 50 MCG/ACT nasal spray Place 1 spray into both nostrils daily as needed for allergies.   gabapentin (NEURONTIN) 100 MG capsule Take 100-200 mg by mouth See admin instructions. Take 200 mg in the morning and 100 mg at night   halobetasol (ULTRAVATE) 0.05 % cream Apply 1 application topically 2 (two) times daily as needed (psoriasis).    ibandronate (BONIVA) 150 MG tablet Take 150 mg by mouth every 30 (thirty) days.   Insulin Glargine (BASAGLAR KWIKPEN) 100 UNIT/ML SOPN Inject 45 Units into the skin daily before breakfast.   Insulin Pen Needle 32G X 4 MM MISC USE TWICE DAILY AS DIRECTED   JANUVIA 100 MG tablet Take 100 mg by mouth daily.   ketotifen (ZADITOR) 0.025 % ophthalmic solution Place 1 drop into both eyes daily as needed (for irritation).   lidocaine (LIDODERM) 5 % Place 1 patch onto the skin daily as needed (for pain- Remove & Discard patch within 12 hours or as directed by MD).   Lidocaine-Glycerin (PREPARATION H EX) Place 1 application rectally 2 (two) times daily as needed (for pain).   losartan (COZAAR) 100 MG tablet TAKE 1 TABLET BY MOUTH EVERY  DAY   magnesium oxide (MAG-OX) 400 (241.3 Mg) MG tablet Take 1 tablet (400 mg total) by mouth 2 (two) times daily. Please make yearly appt with Dr. [name] for April 2022 for future refills. Thank you 1st attempt (Patient taking differently: Take 400 mg by mouth 2 (two) times daily.)   methenamine (HIPREX) 1 g tablet Take 1 g by mouth 2 (two) times daily.   OXYGEN Inhale 3 L/min into the lungs continuous.   tamsulosin (FLOMAX) 0.4 MG CAPS capsule Take  0.4 mg by mouth daily.   tiZANidine (ZANAFLEX) 4 MG tablet Take 4 mg by mouth at bedtime.   VOLTAREN 1 % GEL Apply 2 g topically 4 (four) times daily as needed (for arthritic pain).            Objective:   Physical Exam     06/18/2021     209  10/17/2020       200 06/29/2020    195 06/29/2019    208  06/25/2018    204  03/24/2018        201 03/24/2017         211  09/30/2016       216   09/29/15 221 lb 6.4 oz (100.426 kg)  08/16/15 214 lb (97.07 kg)  05/17/15 216 lb 8 oz (98.204 kg)      Vital signs reviewed  06/18/2021  - Note at rest 02 sats  98% on 3 lpm POC   General appearance:    chronically ill elderly wf using scooter    HEENT : pt wearing mask not removed for exam due to covid -19 concerns.    NECK :  without JVD/Nodes/TM/ nl carotid upstrokes bilaterally   LUNGS: no acc muscle use,  Nl contour chest with distant bs  bilaterally / min insp crackles without cough on insp or exp maneuvers   CV:  RRR  no s3 or murmur or increase in P2, and no edema   ABD:  soft and nontender with nl inspiratory excursion in the supine position. No bruits or organomegaly appreciated, bowel sounds nl  MS:  Nl gait/ ext warm with R BKA/ prosthesis/ calf tenderness, cyanosis or clubbing No obvious joint restrictions   SKIN: warm and dry without lesions    NEURO:  alert, approp, nl sensorium with  no motor or cerebellar deficits apparent.               Assessment:

## 2021-06-18 NOTE — Patient Instructions (Addendum)
Avoid nitrofurantin at all costs   May need to consider reducing the dose of eliquis to 2.5 mg twice daily if Dr [name] supports   Make sure you check your oxygen saturation  at your highest level of activity  to be sure it stays over 90% and adjust  02 flow upward to maintain this level if needed but remember to turn it back to previous settings when you stop (to conserve your supply) If the 02 sats are dropping at the maximum 02 flow then you will need a different portable   Please schedule a follow up visit in 6 months but call sooner if needed

## 2021-06-19 ENCOUNTER — Encounter: Payer: Self-pay | Admitting: Internal Medicine

## 2021-06-19 DIAGNOSIS — R319 Hematuria, unspecified: Secondary | ICD-10-CM | POA: Insufficient documentation

## 2021-06-19 NOTE — Assessment & Plan Note (Signed)
S/p pna ? ali / 2012 > on 02 ever since hs  ONO RA 03/2014:  Desat to 72% Ambulatory ox 2015:  desat with walking CT chest 04/2014 with no PE, no ISLD, mild basilar atx from obesity. PFTs 04/25/14   VC 2.82 (87%) no airflow obst/ ERV 42% and dlco 50% > corrects to 60% for alv vol V/Q 07/2014:  No chronic TE disease Echo with DD, unable to estimate PA pressures, but nothing overt suggestive of clinically significant pulmonary htn. [place] 06/2014:  PA 43/13, mean PA 25, PCWP 10, PVR 2 WU, negative shunt run.  - 09/29/2015   Walked 3lpm   2 laps @ 185 ft each stopped due to sob / slow pace no desats  - 03/24/2017 Patient Saturations on Room Air at Rest = 88%--increased to 96%3lpm pulsed o2  - Echo 04/04/17 - Technically difficult study. Compared to a prior study in 2015, there is now moderate LVH and the LVEF is higher at 65-70%.  No obvious PFO noted by saline microbubble contrast. There is moderate TR with an RVSP of 56 mmHg and a normal, collapsing IVC. Consistent with moderate pulmonary hypertension. - ono RA  05/12/17  desat x 2: 55 min - 05/27/2017  rec 3lpm hs and repeat > ? Done ?> repeat  03/24/2018 >>>  - HC03   02/06/18  = 22  - 03/24/2018   Walked 3lpm Pulsed x one lap @ 185 stopped due to  Sob/ nl pace, sats 91% at end > referred for POC  - ONO 03/25/18 ? Done on RA desat x 380 min < 89%  So   03/31/2018 >>>  rec 3lpm and repeat > done 04/17/18 with desat x 55min on 3lpm > no change rx  - PFT's  06/25/2018  FEV1 2.13 (95 % ) ratio 74  p 7 % improvement from saba p nothing prior to study with DLCO  46 % corrects to 55  % for alv volume  And ERV 4%  - 06/29/2019   Walked 3lpm  x one lap =  approx 250 ft - stopped due to  Back pain min  sob with sats 92% at slow pace   Advised: Avoid macrodantin Make sure you check your oxygen saturation  at your highest level of activity  to be sure it stays over 90% and adjust  02 flow upward to maintain this level if needed but remember to turn it back to previous  settings when you stop (to conserve your supply).

## 2021-06-19 NOTE — Assessment & Plan Note (Signed)
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

## 2021-06-19 NOTE — Assessment & Plan Note (Signed)
Echo 04/04/17 Compared to a prior study in 2015,   there is now moderate LVH and the LVEF is higher at 65-70%. No   obvious PFO noted by saline microbubble contrast. There is   moderate TR with an RVSP of 56 mmHg and a normal, collapsing IVC.   Consistent with moderate pulmonary hypertension. Echo 03/05/21 1. Left ventricular ejection fraction, by estimation, is 60 to 65%. The  left ventricle has normal function. The left ventricle has no regional  wall motion abnormalities. There is severe concentric left ventricular  hypertrophy. Left ventricular diastolic  parameters are consistent with Grade I diastolic dysfunction (impaired  relaxation). There is incoordinate septal motion.  2. Right ventricular systolic function is normal. The right ventricular  size is normal. There is severely elevated pulmonary artery systolic  pressure.  3. Left atrial size was moderately dilated.   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  Total time for H and P, chart review, counseling, reviewing 02 device(s) and generating customized AVS unique to this office visit / same day charting = 33 min

## 2021-07-22 ENCOUNTER — Emergency Department (HOSPITAL_BASED_OUTPATIENT_CLINIC_OR_DEPARTMENT_OTHER)
Admission: EM | Admit: 2021-07-22 | Discharge: 2021-07-22 | Disposition: A | Discharge status: Home / Self Care | Payer: Medicare Other | Attending: Emergency Medicine | Admitting: Emergency Medicine

## 2021-07-22 ENCOUNTER — Encounter (HOSPITAL_BASED_OUTPATIENT_CLINIC_OR_DEPARTMENT_OTHER): Payer: Self-pay | Admitting: Obstetrics and Gynecology

## 2021-07-22 ENCOUNTER — Emergency Department (HOSPITAL_BASED_OUTPATIENT_CLINIC_OR_DEPARTMENT_OTHER): Payer: Medicare Other

## 2021-07-22 ENCOUNTER — Other Ambulatory Visit: Payer: Self-pay

## 2021-07-22 DIAGNOSIS — Z79899 Other long term (current) drug therapy: Secondary | ICD-10-CM | POA: Insufficient documentation

## 2021-07-22 DIAGNOSIS — Z794 Long term (current) use of insulin: Secondary | ICD-10-CM | POA: Insufficient documentation

## 2021-07-22 DIAGNOSIS — S39012A Strain of muscle, fascia and tendon of lower back, initial encounter: Secondary | ICD-10-CM | POA: Diagnosis not present

## 2021-07-22 DIAGNOSIS — I4819 Other persistent atrial fibrillation: Secondary | ICD-10-CM | POA: Diagnosis not present

## 2021-07-22 DIAGNOSIS — Z7984 Long term (current) use of oral hypoglycemic drugs: Secondary | ICD-10-CM | POA: Insufficient documentation

## 2021-07-22 DIAGNOSIS — J449 Chronic obstructive pulmonary disease, unspecified: Secondary | ICD-10-CM | POA: Insufficient documentation

## 2021-07-22 DIAGNOSIS — X58XXXA Exposure to other specified factors, initial encounter: Secondary | ICD-10-CM | POA: Insufficient documentation

## 2021-07-22 DIAGNOSIS — I10 Essential (primary) hypertension: Secondary | ICD-10-CM | POA: Diagnosis not present

## 2021-07-22 DIAGNOSIS — E1142 Type 2 diabetes mellitus with diabetic polyneuropathy: Secondary | ICD-10-CM | POA: Diagnosis not present

## 2021-07-22 DIAGNOSIS — Z7901 Long term (current) use of anticoagulants: Secondary | ICD-10-CM | POA: Insufficient documentation

## 2021-07-22 DIAGNOSIS — Z87891 Personal history of nicotine dependence: Secondary | ICD-10-CM | POA: Diagnosis not present

## 2021-07-22 DIAGNOSIS — J45909 Unspecified asthma, uncomplicated: Secondary | ICD-10-CM | POA: Insufficient documentation

## 2021-07-22 DIAGNOSIS — S3992XA Unspecified injury of lower back, initial encounter: Secondary | ICD-10-CM | POA: Diagnosis present

## 2021-07-22 LAB — URINALYSIS, ROUTINE W REFLEX MICROSCOPIC
Bilirubin Urine: NEGATIVE
Glucose, UA: NEGATIVE mg/dL
Hgb urine dipstick: NEGATIVE
Ketones, ur: NEGATIVE mg/dL
Leukocytes,Ua: NEGATIVE
Nitrite: NEGATIVE
Protein, ur: NEGATIVE mg/dL
Specific Gravity, Urine: 1.012 (ref 1.005–1.030)
pH: 7 (ref 5.0–8.0)

## 2021-07-22 LAB — BASIC METABOLIC PANEL WITH GFR
Anion gap: 8 (ref 5–15)
BUN: 12 mg/dL (ref 8–23)
CO2: 27 mmol/L (ref 22–32)
Calcium: 8.9 mg/dL (ref 8.9–10.3)
Chloride: 104 mmol/L (ref 98–111)
Creatinine, Ser: 0.67 mg/dL (ref 0.44–1.00)
GFR, Estimated: >60 mL/min
Glucose, Bld: 105 mg/dL — ABNORMAL HIGH (ref 70–99)
Potassium: 3.8 mmol/L (ref 3.5–5.1)
Sodium: 139 mmol/L (ref 135–145)

## 2021-07-22 LAB — CBC
HCT: 28.5 % — ABNORMAL LOW (ref 36.0–46.0)
Hemoglobin: 8.4 g/dL — ABNORMAL LOW (ref 12.0–15.0)
MCH: 21.7 pg — ABNORMAL LOW (ref 26.0–34.0)
MCHC: 29.5 g/dL — ABNORMAL LOW (ref 30.0–36.0)
MCV: 73.6 fL — ABNORMAL LOW (ref 80.0–100.0)
Platelets: 269 K/uL (ref 150–400)
RBC: 3.87 MIL/uL (ref 3.87–5.11)
RDW: 16.2 % — ABNORMAL HIGH (ref 11.5–15.5)
WBC: 9.5 K/uL (ref 4.0–10.5)
nRBC: 0 % (ref 0.0–0.2)

## 2021-07-22 MED ORDER — HYDROCODONE-ACETAMINOPHEN 5-325 MG PO TABS
1.0000 | ORAL_TABLET | Freq: Once | ORAL | Status: AC
  Administered 2021-07-22: 1 via ORAL
  Filled 2021-07-22: qty 1

## 2021-07-22 MED ORDER — HYDROCODONE-ACETAMINOPHEN 5-325 MG PO TABS: 1.0000 | ORAL_TABLET | ORAL | 0 refills | Status: DC | PRN

## 2021-07-22 MED ORDER — METHOCARBAMOL 500 MG PO TABS: 500.0000 mg | ORAL_TABLET | Freq: Two times a day (BID) | ORAL | 0 refills | Status: DC | PRN

## 2021-07-22 NOTE — ED Provider Notes (Signed)
[facility] EMERGENCY DEPT Provider Note   CSN: [id number] Arrival date & time: 07/22/21  1432     History Chief Complaint  Patient presents with   Flank Pain    Left    Debbie [name] is a 75 y.o. female.  Pt presents to the ED today with left sided flank pain.  Pt said pain is worse with movement.  She has no pain when lying still.  Pt has a hx of frequent UTIs as well as hematuria.  She has had cystoscopies which have been ok.  Pt is on Eliquis for hx of afib. Pt has neuropathy normally, so has numbness in her legs all the time.  It does not feel worse.      Past Medical History:  Diagnosis Date   A-fib ([place]) 11/15/2019   Abnormal liver function    Adenomatous polyp 12/04/2006   AKI (acute kidney injury) ([place])    Asthma    COPD (chronic obstructive pulmonary disease) ([place])    DM type 2 (diabetes mellitus, type 2) (HCC)    GERD (gastroesophageal reflux disease) 02/28/2012   Hemorrhoid 12/04/2006   Hyperlipidemia    Hypertension    Hypomagnesemia    Hypotension 09/21/2019   OSA (obstructive sleep apnea) 09/23/2014   NPSG 08/2014:  AHI 9/hr  But Completely intolerant of cpap.   - see chronic resp failure    Peripheral arterial disease ([place])    Persistent atrial fibrillation ([place]) 11/15/2019   Pulmonary hypertension ([place]) 03/28/2017   Echo 04/04/17 Compared to a prior study in 2015,   there is now moderate LVH and the LVEF is higher at 65-70%. No   obvious PFO noted by saline microbubble contrast. There is   moderate TR with an RVSP of 56 mmHg and a normal, collapsing IVC.   Consistent with moderate pulmonary hypertension.   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   Onset around 2019  - rec rx for IBS/ diet 06/29/2019     Patient Active Problem List   Diagnosis Date Noted   Hematuria, undiagnosed cause with recurrent uti  06/19/2021   Urinary tract infection associated with  catheterization of urinary tract, initial encounter ([place]) 03/04/2021   Hypokalemia 03/04/2021   Mixed diabetic hyperlipidemia associated with type 2 diabetes mellitus ([place]) 03/04/2021   Nonsustained ventricular tachycardia 03/04/2021   Persistent atrial fibrillation ([place]) 11/15/2019   Abnormal liver function    Hypomagnesemia    Hx of right BKA (HCC)    Obesity (BMI 30.0-34.9)    Hypotension 09/21/2019   AKI (acute kidney injury) ([place])    Sepsis ([place]) 09/20/2019   Acute lower UTI 09/20/2019   Atrial fibrillation with RVR ([place]) 09/20/2019   Septic shock ([place]) 09/20/2019   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   Pulmonary hypertension ([place]) 03/28/2017   Morbid obesity due to excess calories ([place]) 09/29/2015   OSA (obstructive sleep apnea) 09/23/2014   Atherosclerosis of native arteries of extremity with intermittent claudication ([place]) 08/05/2014   Chronic respiratory failure with hypoxia ([place]) 04/25/2014   DOE (dyspnea on exertion) 04/01/2014   Encounter for colonoscopy due to history of adenomatous colonic polyps 02/28/2012   GERD (gastroesophageal reflux disease) 02/28/2012   Type 2 diabetes mellitus with diabetic polyneuropathy, with long-term current use of insulin (HCC)    COPD (chronic obstructive pulmonary disease) ([place])    Essential hypertension  Past Surgical History:  Procedure Laterality Date   CARDIOVERSION N/A 10/18/2019   Procedure: CARDIOVERSION;  Surgeon: [name], MD;  Location: [facility];  Service: Cardiovascular;  Laterality: N/A;   CHOLECYSTECTOMY     COLONOSCOPY  12/03/2006   Dr. Delight Ovens, adenomatous polyp   COLONOSCOPY  03/25/2012   Procedure: COLONOSCOPY;  Surgeon: Daneil Dolin, MD;  [facility];  Service: Endoscopy;  Laterality: N/A;  10:30   ESOPHAGOGASTRODUODENOSCOPY  11/03/2002   Dr. Gala Romney- normal exam- was done to check for possible foreign body   Fiberoptic bronchoscopy with endobronchial  ultrasound  10/22/2010   [facility]    Right BKA  1990   RIGHT HEART CATHETERIZATION N/A 06/22/2014   Procedure: RIGHT HEART CATH;  Surgeon: Larey Dresser, MD;  Location: [facility] CATH LAB;  Service: Cardiovascular;  Laterality: N/A;     OB History   No obstetric history on file.     Family History  Problem Relation Age of Onset   COPD Sister    Diabetes Sister    Breast cancer Sister        Mastectomy   CVA Mother 20   Heart disease Mother    Diabetes Mother 60   Lung cancer Father        lung carcinoma   Colon cancer Neg Hx     Social History   Tobacco Use   Smoking status: Former    Packs/day: 0.50    Years: 18.00    Pack years: 9.00    Types: Cigarettes    Quit date: 09/16/1990    Years since quitting: 30.8   Smokeless tobacco: Never  Substance Use Topics   Alcohol use: No    Alcohol/week: 0.0 standard drinks   Drug use: No    Home Medications Prior to Admission medications   Medication Sig Start Date End Date Taking? Authorizing Provider  HYDROcodone-acetaminophen (NORCO/VICODIN) 5-325 MG tablet Take 1 tablet by mouth every 4 (four) hours as needed. 07/22/21  Yes Isla Pence, MD  methocarbamol (ROBAXIN) 500 MG tablet Take 1 tablet (500 mg total) by mouth 2 (two) times daily as needed for muscle spasms. 07/22/21  Yes Isla Pence, MD  acetaminophen (TYLENOL) 500 MG tablet Take 500 mg by mouth every 6 (six) hours as needed for mild pain (or headaches).    [provider]  apixaban (ELIQUIS) 5 MG TABS tablet Take 1 tablet (5 mg total) by mouth 2 (two) times daily. 09/24/19   Florencia Reasons, MD  BD PEN NEEDLE NANO U/F 32G X 4 MM MISC 2 (two) times daily. as directed 02/26/15   [provider]  Calcium Carb-Cholecalciferol (CALCIUM 1000 + D PO) Take 1,000 mg by mouth daily.    [provider]  Coenzyme Q10 400 MG CAPS Take 400 mg by mouth daily.     [provider]  dexlansoprazole (DEXILANT) 60 MG capsule Take 1 capsule (60 mg total) by mouth daily. 09/29/15   Tanda Rockers,  MD  diltiazem (CARDIZEM CD) 240 MG 24 hr capsule Take 1 capsule (240 mg total) by mouth daily. 09/25/19   Florencia Reasons, MD  diltiazem [facility]) 240 MG 24 hr capsule Take 1 capsule by mouth daily. 04/24/21   [provider]  dofetilide (TIKOSYN) 250 MCG capsule TAKE 1 CAPSULE BY MOUTH TWICE A DAY 09/12/20   Evans Lance, MD  estradiol (ESTRACE) 0.1 MG/GM vaginal cream Place vaginally. 04/19/21   [provider]  ezetimibe (ZETIA) 10 MG tablet  Take 10 mg by mouth every evening.  08/27/19   [provider]  fluticasone (FLONASE) 50 MCG/ACT nasal spray Place 1 spray into both nostrils daily as needed for allergies.    [provider]  furosemide (LASIX) 40 MG tablet Take 0.5 tablets (20 mg total) by mouth daily as needed for fluid or edema. 09/24/19 03/05/21  Florencia Reasons, MD  gabapentin (NEURONTIN) 100 MG capsule Take 100-200 mg by mouth See admin instructions. Take 200 mg in the morning and 100 mg at night    [provider]  halobetasol (ULTRAVATE) 0.05 % cream Apply 1 application topically 2 (two) times daily as needed (psoriasis).  08/15/16   [provider]  ibandronate (BONIVA) 150 MG tablet Take 150 mg by mouth every 30 (thirty) days. 02/15/20   [provider]  Insulin Glargine (BASAGLAR KWIKPEN) 100 UNIT/ML SOPN Inject 45 Units into the skin daily before breakfast.    [provider]  Insulin Pen Needle 32G X 4 MM MISC USE TWICE DAILY AS DIRECTED 05/31/15   [provider]  JANUMET XR 50-1000 MG TB24 Take 1 tablet by mouth 2 (two) times daily. 06/15/21   [provider]  JANUVIA 100 MG tablet Take 100 mg by mouth daily. 04/05/21   [provider]  ketotifen (ZADITOR) 0.025 % ophthalmic solution Place 1 drop into both eyes daily as needed (for irritation).    [provider]  lidocaine (LIDODERM) 5 % Place 1 patch onto the skin daily as needed (for pain- Remove & Discard patch within 12 hours or as directed  by MD).    [provider]  Lidocaine-Glycerin (PREPARATION H EX) Place 1 application rectally 2 (two) times daily as needed (for pain).    [provider]  losartan (COZAAR) 100 MG tablet TAKE 1 TABLET BY MOUTH EVERY DAY 06/08/21   Evans Lance, MD  magnesium oxide (MAG-OX) 400 (241.3 Mg) MG tablet Take 1 tablet (400 mg total) by mouth 2 (two) times daily. Please make yearly appt with Dr. [name] for April 2022 for future refills. Thank you 1st attempt Patient taking differently: Take 400 mg by mouth 2 (two) times daily. 10/18/20   Evans Lance, MD  methenamine (HIPREX) 1 g tablet Take 1 g by mouth 2 (two) times daily. 05/17/21   [provider]  OXYGEN Inhale 3 L/min into the lungs continuous.    [provider]  tamsulosin (FLOMAX) 0.4 MG CAPS capsule Take 0.4 mg by mouth daily. 12/08/20   [provider]  tiZANidine (ZANAFLEX) 4 MG tablet Take 4 mg by mouth at bedtime. 11/04/19   [provider]  VOLTAREN 1 % GEL Apply 2 g topically 4 (four) times daily as needed (for arthritic pain). 08/13/17   [provider]    Allergies    Meloxicam  Review of Systems   Review of Systems  Musculoskeletal:  Positive for back pain.  All other systems reviewed and are negative.  Physical Exam Updated Vital Signs BP (!) 181/84 (BP Location: Right Arm)   Pulse 91   Temp 98 F (36.7 C) (Oral)   Resp 17   SpO2 97%   Physical Exam Vitals and nursing note reviewed.  Constitutional:      Appearance: Normal appearance.  HENT:     Head: Normocephalic and atraumatic.     Right Ear: External ear normal.     Left Ear: External ear normal.     Nose: Nose normal.  Mouth/Throat:     Mouth: Mucous membranes are moist.     Pharynx: Oropharynx is clear.  Eyes:     Extraocular Movements: Extraocular movements intact.     Conjunctiva/sclera: Conjunctivae normal.     Pupils: Pupils are equal, round, and reactive to light.  Cardiovascular:      Rate and Rhythm: Normal rate and regular rhythm.     Pulses: Normal pulses.     Heart sounds: Normal heart sounds.  Pulmonary:     Effort: Pulmonary effort is normal.     Breath sounds: Normal breath sounds.  Abdominal:     General: Abdomen is flat. Bowel sounds are normal.     Palpations: Abdomen is soft.  Musculoskeletal:        General: Normal range of motion.     Cervical back: Normal range of motion and neck supple.       Back:  Skin:    General: Skin is warm.     Capillary Refill: Capillary refill takes less than 2 seconds.  Neurological:     General: No focal deficit present.     Mental Status: She is alert and oriented to person, place, and time.  Psychiatric:        Mood and Affect: Mood normal.        Behavior: Behavior normal.    ED Results / Procedures / Treatments   Labs (all labs ordered are listed, but only abnormal results are displayed) Labs Reviewed  BASIC METABOLIC PANEL - Abnormal; Notable for the following components:      Result Value   Glucose, Bld 105 (*)    All other components within normal limits  CBC - Abnormal; Notable for the following components:   Hemoglobin 8.4 (*)    HCT 28.5 (*)    MCV 73.6 (*)    MCH 21.7 (*)    MCHC 29.5 (*)    RDW 16.2 (*)    All other components within normal limits  URINALYSIS, ROUTINE W REFLEX MICROSCOPIC    EKG None  Radiology CT Renal Stone Study  Result Date: 07/22/2021 CLINICAL DATA:  Flank pain, left EXAM: CT ABDOMEN AND PELVIS WITHOUT CONTRAST TECHNIQUE: Multidetector CT imaging of the abdomen and pelvis was performed following the standard protocol without IV contrast. COMPARISON:  None. FINDINGS: LOWER CHEST: Emphysema HEPATOBILIARY: Coarse, nodular hepatic contours. No focal lesion. Gallbladder is nondistended or absent. No biliary dilatation. PANCREAS: Normal pancreas. No ductal dilatation or peripancreatic fluid collection. SPLEEN: Normal. ADRENALS/URINARY TRACT: The adrenal glands are normal. No  hydronephrosis, nephroureterolithiasis or solid renal mass. The urinary bladder is normal for degree of distention STOMACH/BOWEL: There is no hiatal hernia. Normal duodenal course and caliber. No small bowel dilatation or inflammation. No focal colonic abnormality. Normal appendix. VASCULAR/LYMPHATIC: There is calcific atherosclerosis of the abdominal aorta. No lymphadenopathy. REPRODUCTIVE: Normal uterus. No adnexal mass. MUSCULOSKELETAL. No bony spinal canal stenosis or focal osseous abnormality. OTHER: None. IMPRESSION: 1. No acute abnormality of the abdomen or pelvis. 2. Hepatic cirrhosis. Aortic Atherosclerosis (ICD10-I70.0) and Emphysema (ICD10-J43.9). Electronically Signed   By: Ulyses Jarred M.D.   On: 07/22/2021 19:29    Procedures Procedures   Medications Ordered in ED Medications  HYDROcodone-acetaminophen (NORCO/VICODIN) 5-325 MG per tablet 1 tablet (has no administration in time range)    ED Course  I have reviewed the triage vital signs and the nursing notes.  Pertinent labs & imaging results that were available during my care of the patient were reviewed by me and  considered in my medical decision making (see chart for details).    MDM Rules/Calculators/A&P                           Pt's eval is unremarkable for anything acute.  H/h is low, but stable.  UA neg.  CT renal shows nothing acute.  Pain is likely msk.  Pt is stable for d/c.  Return if worse.  F/u with pcp. Final Clinical Impression(s) / ED Diagnoses Final diagnoses:  Strain of lumbar region, initial encounter    Rx / DC Orders ED Discharge Orders          Ordered    HYDROcodone-acetaminophen (NORCO/VICODIN) 5-325 MG tablet  Every 4 hours PRN        07/22/21 1949    methocarbamol (ROBAXIN) 500 MG tablet  2 times daily PRN        07/22/21 1949             Isla Pence, MD 07/22/21 1952

## 2021-07-22 NOTE — ED Triage Notes (Signed)
Patient reports to the ER for flank pain on the left side since Friday morning. Patient reports she felt like she pulled something but since then she has had increasing pain with movement.

## 2021-07-22 NOTE — ED Notes (Signed)
Pt  states that she is pain free when lying still. Pt reports pain with exertion.

## 2021-07-22 NOTE — ED Notes (Signed)
Back from CT

## 2021-08-16 ENCOUNTER — Other Ambulatory Visit: Payer: Self-pay

## 2021-08-16 ENCOUNTER — Telehealth: Payer: Self-pay | Admitting: Internal Medicine

## 2021-08-16 ENCOUNTER — Encounter (HOSPITAL_BASED_OUTPATIENT_CLINIC_OR_DEPARTMENT_OTHER): Payer: Self-pay | Admitting: Obstetrics and Gynecology

## 2021-08-16 ENCOUNTER — Emergency Department (HOSPITAL_BASED_OUTPATIENT_CLINIC_OR_DEPARTMENT_OTHER): Payer: Medicare Other | Admitting: Radiology

## 2021-08-16 ENCOUNTER — Inpatient Hospital Stay (HOSPITAL_BASED_OUTPATIENT_CLINIC_OR_DEPARTMENT_OTHER)
Admission: EM | Admit: 2021-08-16 | Discharge: 2021-08-19 | DRG: 291 | Disposition: A | Discharge status: Home / Self Care | Payer: Medicare Other | Attending: Internal Medicine | Admitting: Internal Medicine

## 2021-08-16 DIAGNOSIS — I5043 Acute on chronic combined systolic (congestive) and diastolic (congestive) heart failure: Secondary | ICD-10-CM

## 2021-08-16 DIAGNOSIS — I509 Heart failure, unspecified: Secondary | ICD-10-CM

## 2021-08-16 DIAGNOSIS — Z79899 Other long term (current) drug therapy: Secondary | ICD-10-CM

## 2021-08-16 DIAGNOSIS — Z7984 Long term (current) use of oral hypoglycemic drugs: Secondary | ICD-10-CM

## 2021-08-16 DIAGNOSIS — I7300 Raynaud's syndrome without gangrene: Secondary | ICD-10-CM | POA: Diagnosis present

## 2021-08-16 DIAGNOSIS — Z833 Family history of diabetes mellitus: Secondary | ICD-10-CM

## 2021-08-16 DIAGNOSIS — Z823 Family history of stroke: Secondary | ICD-10-CM

## 2021-08-16 DIAGNOSIS — J9621 Acute and chronic respiratory failure with hypoxia: Secondary | ICD-10-CM | POA: Diagnosis present

## 2021-08-16 DIAGNOSIS — Z803 Family history of malignant neoplasm of breast: Secondary | ICD-10-CM

## 2021-08-16 DIAGNOSIS — I5033 Acute on chronic diastolic (congestive) heart failure: Secondary | ICD-10-CM | POA: Diagnosis not present

## 2021-08-16 DIAGNOSIS — Z801 Family history of malignant neoplasm of trachea, bronchus and lung: Secondary | ICD-10-CM

## 2021-08-16 DIAGNOSIS — Z8249 Family history of ischemic heart disease and other diseases of the circulatory system: Secondary | ICD-10-CM | POA: Diagnosis not present

## 2021-08-16 DIAGNOSIS — Z794 Long term (current) use of insulin: Secondary | ICD-10-CM | POA: Diagnosis not present

## 2021-08-16 DIAGNOSIS — I4819 Other persistent atrial fibrillation: Secondary | ICD-10-CM | POA: Diagnosis present

## 2021-08-16 DIAGNOSIS — Z89511 Acquired absence of right leg below knee: Secondary | ICD-10-CM

## 2021-08-16 DIAGNOSIS — K219 Gastro-esophageal reflux disease without esophagitis: Secondary | ICD-10-CM | POA: Diagnosis not present

## 2021-08-16 DIAGNOSIS — E782 Mixed hyperlipidemia: Secondary | ICD-10-CM | POA: Diagnosis present

## 2021-08-16 DIAGNOSIS — Z7901 Long term (current) use of anticoagulants: Secondary | ICD-10-CM | POA: Diagnosis not present

## 2021-08-16 DIAGNOSIS — E1165 Type 2 diabetes mellitus with hyperglycemia: Secondary | ICD-10-CM | POA: Diagnosis present

## 2021-08-16 DIAGNOSIS — Z9981 Dependence on supplemental oxygen: Secondary | ICD-10-CM

## 2021-08-16 DIAGNOSIS — G4733 Obstructive sleep apnea (adult) (pediatric): Secondary | ICD-10-CM | POA: Diagnosis present

## 2021-08-16 DIAGNOSIS — J449 Chronic obstructive pulmonary disease, unspecified: Secondary | ICD-10-CM | POA: Diagnosis present

## 2021-08-16 DIAGNOSIS — E1142 Type 2 diabetes mellitus with diabetic polyneuropathy: Secondary | ICD-10-CM | POA: Diagnosis present

## 2021-08-16 DIAGNOSIS — Z87891 Personal history of nicotine dependence: Secondary | ICD-10-CM

## 2021-08-16 DIAGNOSIS — I272 Pulmonary hypertension, unspecified: Secondary | ICD-10-CM | POA: Diagnosis present

## 2021-08-16 DIAGNOSIS — Z20822 Contact with and (suspected) exposure to covid-19: Secondary | ICD-10-CM | POA: Diagnosis present

## 2021-08-16 DIAGNOSIS — I110 Hypertensive heart disease with heart failure: Principal | ICD-10-CM | POA: Diagnosis present

## 2021-08-16 DIAGNOSIS — E1169 Type 2 diabetes mellitus with other specified complication: Secondary | ICD-10-CM | POA: Diagnosis present

## 2021-08-16 DIAGNOSIS — D6489 Other specified anemias: Secondary | ICD-10-CM | POA: Diagnosis present

## 2021-08-16 DIAGNOSIS — I493 Ventricular premature depolarization: Secondary | ICD-10-CM | POA: Diagnosis present

## 2021-08-16 LAB — RESP PANEL BY RT-PCR (FLU A&B, COVID) ARPGX2
Influenza A by PCR: NEGATIVE
Influenza B by PCR: NEGATIVE
SARS Coronavirus 2 by RT PCR: NEGATIVE

## 2021-08-16 LAB — BASIC METABOLIC PANEL
Anion gap: 8 (ref 5–15)
BUN: 15 mg/dL (ref 8–23)
CO2: 27 mmol/L (ref 22–32)
Calcium: 9.4 mg/dL (ref 8.9–10.3)
Chloride: 107 mmol/L (ref 98–111)
Creatinine, Ser: 0.75 mg/dL (ref 0.44–1.00)
GFR, Estimated: >60 mL/min (ref >60)
Glucose, Bld: 101 mg/dL — ABNORMAL HIGH (ref 70–99)
Potassium: 3.6 mmol/L (ref 3.5–5.1)
Sodium: 142 mmol/L (ref 135–145)

## 2021-08-16 LAB — CBC
HCT: 27.9 % — ABNORMAL LOW (ref 36.0–46.0)
Hemoglobin: 8 g/dL — ABNORMAL LOW (ref 12.0–15.0)
MCH: 20.9 pg — ABNORMAL LOW (ref 26.0–34.0)
MCHC: 28.7 g/dL — ABNORMAL LOW (ref 30.0–36.0)
MCV: 72.8 fL — ABNORMAL LOW (ref 80.0–100.0)
Platelets: 290 10*3/uL (ref 150–400)
RBC: 3.83 MIL/uL — ABNORMAL LOW (ref 3.87–5.11)
RDW: 17.1 % — ABNORMAL HIGH (ref 11.5–15.5)
WBC: 10.3 10*3/uL (ref 4.0–10.5)
nRBC: 0 % (ref 0.0–0.2)

## 2021-08-16 LAB — GLUCOSE, CAPILLARY
Glucose-Capillary: 95 mg/dL (ref 70–99)
Glucose-Capillary: 146 mg/dL — ABNORMAL HIGH (ref 70–99)

## 2021-08-16 LAB — TROPONIN I (HIGH SENSITIVITY)
Troponin I (High Sensitivity): 13 ng/L (ref <18)
Troponin I (High Sensitivity): 12 ng/L (ref <18)

## 2021-08-16 LAB — BRAIN NATRIURETIC PEPTIDE: B Natriuretic Peptide: 190.1 pg/mL — ABNORMAL HIGH (ref 0.0–100.0)

## 2021-08-16 LAB — CBG MONITORING, ED: Glucose-Capillary: 60 mg/dL — ABNORMAL LOW (ref 70–99)

## 2021-08-16 MED ORDER — FLUTICASONE PROPIONATE 50 MCG/ACT NA SUSP: 1.0000 | Freq: Every day | NASAL | Status: DC | PRN

## 2021-08-16 MED ORDER — ACETAMINOPHEN 325 MG PO TABS
650.0000 mg | ORAL_TABLET | Freq: Four times a day (QID) | ORAL | Status: DC | PRN
  Administered 2021-08-16 – 2021-08-17 (×3): 650 mg via ORAL
  Filled 2021-08-16 (×3): qty 2

## 2021-08-16 MED ORDER — DOFETILIDE 250 MCG PO CAPS
250.0000 ug | ORAL_CAPSULE | Freq: Two times a day (BID) | ORAL | Status: DC
  Administered 2021-08-16 – 2021-08-19 (×6): 250 ug via ORAL
  Filled 2021-08-16 (×6): qty 1

## 2021-08-16 MED ORDER — LOSARTAN POTASSIUM 50 MG PO TABS
100.0000 mg | ORAL_TABLET | Freq: Every day | ORAL | Status: DC
  Administered 2021-08-17 – 2021-08-19 (×3): 100 mg via ORAL
  Filled 2021-08-16 (×3): qty 2

## 2021-08-16 MED ORDER — POLYETHYLENE GLYCOL 3350 17 G PO PACK: 17.0000 g | PACK | Freq: Every day | ORAL | Status: DC | PRN

## 2021-08-16 MED ORDER — GABAPENTIN 100 MG PO CAPS
100.0000 mg | ORAL_CAPSULE | Freq: Every day | ORAL | Status: DC
  Administered 2021-08-16 – 2021-08-18 (×3): 100 mg via ORAL
  Filled 2021-08-16 (×3): qty 1

## 2021-08-16 MED ORDER — TAMSULOSIN HCL 0.4 MG PO CAPS
0.4000 mg | ORAL_CAPSULE | Freq: Every day | ORAL | Status: DC
  Administered 2021-08-17 – 2021-08-19 (×3): 0.4 mg via ORAL
  Filled 2021-08-16 (×3): qty 1

## 2021-08-16 MED ORDER — GABAPENTIN 100 MG PO CAPS
200.0000 mg | ORAL_CAPSULE | Freq: Every day | ORAL | Status: DC
  Administered 2021-08-17 – 2021-08-19 (×3): 200 mg via ORAL
  Filled 2021-08-16 (×3): qty 2

## 2021-08-16 MED ORDER — DILTIAZEM HCL ER COATED BEADS 240 MG PO CP24
240.0000 mg | ORAL_CAPSULE | Freq: Every day | ORAL | Status: DC
  Administered 2021-08-17 – 2021-08-19 (×3): 240 mg via ORAL
  Filled 2021-08-16 (×3): qty 1

## 2021-08-16 MED ORDER — ACETAMINOPHEN 650 MG RE SUPP: 650.0000 mg | Freq: Four times a day (QID) | RECTAL | Status: DC | PRN

## 2021-08-16 MED ORDER — PANTOPRAZOLE SODIUM 40 MG PO TBEC
40.0000 mg | DELAYED_RELEASE_TABLET | Freq: Every day | ORAL | Status: DC
  Administered 2021-08-17 – 2021-08-19 (×3): 40 mg via ORAL
  Filled 2021-08-16 (×3): qty 1

## 2021-08-16 MED ORDER — SODIUM CHLORIDE 0.9% FLUSH
3.0000 mL | Freq: Two times a day (BID) | INTRAVENOUS | Status: DC
  Administered 2021-08-16 – 2021-08-19 (×5): 3 mL via INTRAVENOUS

## 2021-08-16 MED ORDER — INSULIN ASPART 100 UNIT/ML IJ SOLN
0.0000 [IU] | Freq: Three times a day (TID) | INTRAMUSCULAR | Status: DC
  Administered 2021-08-17: 3 [IU] via SUBCUTANEOUS
  Administered 2021-08-18: 2 [IU] via SUBCUTANEOUS
  Administered 2021-08-18 – 2021-08-19 (×4): 3 [IU] via SUBCUTANEOUS

## 2021-08-16 MED ORDER — FUROSEMIDE 10 MG/ML IJ SOLN
40.0000 mg | Freq: Two times a day (BID) | INTRAMUSCULAR | Status: DC
  Administered 2021-08-16 – 2021-08-19 (×6): 40 mg via INTRAVENOUS
  Filled 2021-08-16 (×6): qty 4

## 2021-08-16 MED ORDER — INSULIN GLARGINE-YFGN 100 UNIT/ML ~~LOC~~ SOLN
30.0000 [IU] | Freq: Every day | SUBCUTANEOUS | Status: DC
  Administered 2021-08-17 – 2021-08-19 (×3): 30 [IU] via SUBCUTANEOUS
  Filled 2021-08-16 (×3): qty 0.3

## 2021-08-16 MED ORDER — DICLOFENAC SODIUM 1 % EX GEL
2.0000 g | Freq: Four times a day (QID) | CUTANEOUS | Status: DC | PRN
  Administered 2021-08-17 (×2): 2 g via TOPICAL
  Filled 2021-08-16: qty 100

## 2021-08-16 MED ORDER — EZETIMIBE 10 MG PO TABS
10.0000 mg | ORAL_TABLET | Freq: Every evening | ORAL | Status: DC
  Administered 2021-08-17 – 2021-08-18 (×2): 10 mg via ORAL
  Filled 2021-08-16 (×2): qty 1

## 2021-08-16 MED ORDER — POTASSIUM CHLORIDE CRYS ER 20 MEQ PO TBCR
40.0000 meq | EXTENDED_RELEASE_TABLET | Freq: Once | ORAL | Status: AC
  Administered 2021-08-16: 40 meq via ORAL
  Filled 2021-08-16: qty 2

## 2021-08-16 MED ORDER — APIXABAN 5 MG PO TABS
5.0000 mg | ORAL_TABLET | Freq: Two times a day (BID) | ORAL | Status: DC
  Administered 2021-08-16 – 2021-08-19 (×6): 5 mg via ORAL
  Filled 2021-08-16 (×6): qty 1

## 2021-08-16 NOTE — ED Notes (Signed)
Report given to [facility] RN at [facility]

## 2021-08-16 NOTE — Telephone Encounter (Signed)
Called and spoke to pt. Pt states she has had a worsening ShOB over the last 3 days. Pt states she is only able to walk 10-12 feet before she becomes severely ShOB with substernal CP. Pt denies significant cough. Pt c/o wheezing, dizziness, and edema to her lower left leg (right leg prosthesis). Pt states her left lower leg is so edematous if feels its going to pop. Pt states the edema has worsened over the last 3 days as well. Pt is normally on 3lpm of O2 but recently has had to increase it. Pt states she is on 3lpm at rest at 91% but when she is slightly active she drops to low 80s on 4lpm. Advised pt to seek emergency care as her s/s should be evaluated at the ED. Pt agreed she felt she needed to go to ED for evaluation.   Will forward to Dr. Melvyn Novas as Juluis Rainier.

## 2021-08-16 NOTE — ED Triage Notes (Signed)
Patient reports to the ER for shortness of breath. Patient reports she cannot keep her oxygen up. Patient reports feeling dizzy and anxious. Denies diagnosis of COPD but reports "lung damage" from having pneumonia multiple years in a row.

## 2021-08-16 NOTE — ED Notes (Signed)
Report given to [facility]

## 2021-08-16 NOTE — H&P (Signed)
History and Physical   Debbie [name] YOV:[id number] DOB: [birth year] DOA: 08/16/2021  PCP: [name], MD   Patient coming from: Home  Chief Complaint: Shortness of breath  HPI: Debbie [name] is a 75 y.o. female with medical history significant of pulmonary hypertension, CHF, A. fib, COPD, hypertension, GERD, status post right BKA, hyperlipidemia, diabetes, OSA presenting with shortness of breath for the past 3 to 4 days.  Patient states that she is noticing shortness of breath for last 3 to 4 days.  She is chronically on 3 L at home and has had to increase this to 4 L.  She has had desaturation to the 80s on ambulation.  She also is noted lower extremity edema and a nonproductive cough.  She has anxiety about her inability to maintain oxygen.  She denies fevers, chills, chest pain, shortness of breath, abdominal pain, constipation, diarrhea, nausea, vomiting.   ED Course: Vital signs in the ED significant for requiring 3 L to maintain saturations.  Blood pressure in the 741O to 878M systolic.  Lab work-up showed BMP within normal limits.  CBC showed hemoglobin of 8 which is near baseline around 8.5.  Troponin normal x2.  BNP mildly elevated at 190.  Respiratory panel for flu and COVID-negative.  Chest x-ray showed mild diffuse interstitial opacity consistent with edema.  Patient did not receive any treatments in the ED per chart review.  Review of Systems: As per HPI otherwise all other systems reviewed and are negative.  Past Medical History:  Diagnosis Date   A-fib ([place]) 11/15/2019   Abnormal liver function    Adenomatous polyp 12/04/2006   AKI (acute kidney injury) ([place])    Asthma    COPD (chronic obstructive pulmonary disease) ([place])    DM type 2 (diabetes mellitus, type 2) (HCC)    GERD (gastroesophageal reflux disease) 02/28/2012   Hemorrhoid 12/04/2006   Hyperlipidemia    Hypertension    Hypomagnesemia    Hypotension 09/21/2019   OSA (obstructive sleep apnea) 09/23/2014    NPSG 08/2014:  AHI 9/hr  But Completely intolerant of cpap.   - see chronic resp failure    Peripheral arterial disease ([place])    Persistent atrial fibrillation ([place]) 11/15/2019   Pulmonary hypertension ([place]) 03/28/2017   Echo 04/04/17 Compared to a prior study in 2015,   there is now moderate LVH and the LVEF is higher at 65-70%. No   obvious PFO noted by saline microbubble contrast. There is   moderate TR with an RVSP of 56 mmHg and a normal, collapsing IVC.   Consistent with moderate pulmonary hypertension.   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 Delight Ovens, adenomatous polyp   COLONOSCOPY  03/25/2012   Procedure: COLONOSCOPY;  Surgeon: Daneil Dolin, MD;  Location: [facility] ENDO SUITE;  Service: Endoscopy;  Laterality: N/A;  10:30   ESOPHAGOGASTRODUODENOSCOPY  11/03/2002   Dr. Gala Romney- normal exam- was done to check for possible foreign body   Fiberoptic bronchoscopy with endobronchial  ultrasound  10/22/2010   Arlyce Dice  Right BKA  1990   RIGHT HEART CATHETERIZATION N/A 06/22/2014   Procedure: RIGHT HEART CATH;  Surgeon: Larey Dresser, MD;  Location[facility] C[facility] LAB;  Service: Cardiovascular;  Laterality: N/A;    Social History  reports that she quit smoking about 30 years ago. Her smoking use included cigarettes. She has a 9.00 pack-year smoking history. She has never used smokeless tobacco. She reports that she does not drink alcohol and does not use drugs.  Allergies  Allergen Reactions   Meloxicam Other (See Comments)    Causes excess Fluid buildup    Family History  Problem Relation Age of  Onset   COPD Sister    Diabetes Sister    Breast cancer Sister        Mastectomy   CVA Mother 57   Heart disease Mother    Diabetes Mother 30   Lung cancer Father        lung carcinoma   Colon cancer Neg Hx   Reviewed on admission  Prior to Admission medications   Medication Sig Start Date End Date Taking? Authorizing Provider  apixaban (ELIQUIS) 5 MG TABS tablet Take 1 tablet (5 mg total) by mouth 2 (two) times daily. 09/24/19  Yes Florencia Reasons, MD  Calcium Carb-Cholecalciferol (CALCIUM 1000 + D PO) Take 1,000 mg by mouth daily.   Yes [provider]  Coenzyme Q10 400 MG CAPS Take 400 mg by mouth daily.    Yes [provider]  dexlansoprazole (DEXILANT) 60 MG capsule Take 1 capsule (60 mg total) by mouth daily. 09/29/15  Yes Tanda Rockers, MD  diltiazem (CARDIZEM CD) 240 MG 24 hr capsule Take 1 capsule (240 mg total) by mouth daily. 09/25/19  Yes Florencia Reasons, MD  dofetilide [facility]) 250 MCG capsule TAKE 1 CAPSULE BY MOUTH TWICE A DAY 09/12/20  Yes Evans Lance, MD  estradiol (ESTRACE) 0.1 MG/GM vaginal cream Place vaginally. 04/19/21  Yes [provider]  ezetimibe (ZETIA) 10 MG tablet Take 10 mg by mouth every evening.  08/27/19  Yes [provider]  furosemide (LASIX) 40 MG tablet Take 0.5 tablets (20 mg total) by mouth daily as needed for fluid or edema. 09/24/19 08/16/21 Yes Florencia Reasons, MD  gabapentin (NEURONTIN) 100 MG capsule Take 100-200 mg by mouth See admin instructions. Take 200 mg in the morning and 100 mg at night   Yes [provider]  halobetasol (ULTRAVATE) 0.05 % cream Apply 1 application topically 2 (two) times daily as needed (psoriasis).  08/15/16  Yes [provider]  ibandronate (BONIVA) 150 MG tablet Take 150 mg by mouth every 30 (thirty) days. 02/15/20  Yes [provider]  Insulin Glargine (BASAGLAR KWIKPEN) 100 UNIT/ML SOPN Inject 45 Units into the skin daily before breakfast.   Yes [provider]  JANUVIA  100 MG tablet Take 100 mg by mouth daily. 04/05/21  Yes [provider]  ketotifen (ZADITOR) 0.025 % ophthalmic solution Place 1 drop into both eyes daily as needed (for irritation).   Yes [provider]  losartan (COZAAR) 100 MG tablet TAKE 1 TABLET BY MOUTH EVERY DAY 06/08/21  Yes Evans Lance, MD  magnesium oxide (MAG-OX) 400 (241.3 Mg) MG tablet Take 1 tablet (400 mg total) by mouth 2 (two) times daily. Please make yearly appt with Dr. [name] for April 2022 for future refills. Thank you 1st attempt Patient taking differently: Take 400 mg by mouth 2 (two) times daily. 10/18/20  Yes Evans Lance,  MD  methenamine (HIPREX) 1 g tablet Take 1 g by mouth 2 (two) times daily. 05/17/21  Yes [provider]  tamsulosin (FLOMAX) 0.4 MG CAPS capsule Take 0.4 mg by mouth daily. 12/08/20  Yes [provider]  tiZANidine (ZANAFLEX) 4 MG tablet Take 4 mg by mouth at bedtime. 11/04/19  Yes [provider]  VOLTAREN 1 % GEL Apply 2 g topically 4 (four) times daily as needed (for arthritic pain). 08/13/17  Yes [provider]  acetaminophen (TYLENOL) 500 MG tablet Take 500 mg by mouth every 6 (six) hours as needed for mild pain (or headaches).    [provider]  BD PEN NEEDLE NANO U/F 32G X 4 MM MISC 2 (two) times daily. as directed 02/26/15   [provider]  fluticasone (FLONASE) 50 MCG/ACT nasal spray Place 1 spray into both nostrils daily as needed for allergies.    [provider]  HYDROcodone-acetaminophen (NORCO/VICODIN) 5-325 MG tablet Take 1 tablet by mouth every 4 (four) hours as needed. Patient not taking: Reported on 08/16/2021 07/22/21   Isla Pence, MD  Insulin Pen Needle 32G X 4 MM MISC USE TWICE DAILY AS DIRECTED 05/31/15   [provider]  Lidocaine-Glycerin (PREPARATION H EX) Place 1 application rectally 2 (two) times daily as needed (for pain).    [provider]  methocarbamol (ROBAXIN) 500 MG  tablet Take 1 tablet (500 mg total) by mouth 2 (two) times daily as needed for muscle spasms. Patient not taking: Reported on 08/16/2021 07/22/21   Isla Pence, MD  OXYGEN Inhale 3 L/min into the lungs continuous.    [provider]    Physical Exam: Vitals:   08/16/21 1700 08/16/21 1730 08/16/21 1844 08/16/21 1945  BP: (!) 191/82 (!) 157/91 (!) 187/92 (!) 171/61  Pulse: 62 67 62 67  Resp: 18 18 20 16   Temp:   98.9 F (37.2 C) 98 F (36.7 C)  TempSrc:   Oral Oral  SpO2: 95% 95% 98% 96%  Weight:      Height:       Physical Exam Constitutional:      General: She is not in acute distress.    Appearance: Normal appearance.  HENT:     Head: Normocephalic and atraumatic.     Mouth/Throat:     Mouth: Mucous membranes are moist.     Pharynx: Oropharynx is clear.  Eyes:     Extraocular Movements: Extraocular movements intact.     Pupils: Pupils are equal, round, and reactive to light.  Cardiovascular:     Rate and Rhythm: Normal rate and regular rhythm.     Pulses: Normal pulses.     Heart sounds: Normal heart sounds.  Pulmonary:     Effort: Pulmonary effort is normal. No respiratory distress.     Breath sounds: Rales (trace) present.  Abdominal:     General: Bowel sounds are normal. There is no distension.     Palpations: Abdomen is soft.     Tenderness: There is no abdominal tenderness.  Musculoskeletal:        General: No swelling or deformity.     Right lower leg: Edema present.     Left lower leg: Edema present.  Skin:    General: Skin is warm and dry.  Neurological:     General: No focal deficit present.     Mental Status: Mental status is at baseline.    Labs on Admission: I have personally reviewed following labs and imaging  studies  CBC: Recent Labs  Lab 08/16/21 1210  WBC 10.3  HGB 8.0*  HCT 27.9*  MCV 72.8*  PLT 517    Basic Metabolic Panel: Recent Labs  Lab 08/16/21 1210  NA 142  K 3.6  CL 107  CO2 27  GLUCOSE 101*  BUN 15   CREATININE 0.75  CALCIUM 9.4    GFR: Estimated Creatinine Clearance: 75.7 mL/min (by C-G formula based on SCr of 0.75 mg/dL).  Liver Function Tests: No results for input(s): AST, ALT, ALKPHOS, BILITOT, PROT, ALBUMIN in the last 168 hours.  Urine analysis:    Component Value Date/Time   COLORURINE YELLOW 07/22/2021 1530   APPEARANCEUR CLEAR 07/22/2021 1530   LABSPEC 1.012 07/22/2021 1530   PHURINE 7.0 07/22/2021 1530   GLUCOSEU NEGATIVE 07/22/2021 1530   HGBUR NEGATIVE 07/22/2021 1530   BILIRUBINUR NEGATIVE 07/22/2021 1530   KETONESUR NEGATIVE 07/22/2021 1530   PROTEINUR NEGATIVE 07/22/2021 1530   NITRITE NEGATIVE 07/22/2021 1530   LEUKOCYTESUR NEGATIVE 07/22/2021 1530    Radiological Exams on Admission: DG Chest 2 View  Result Date: 08/16/2021 CLINICAL DATA:  Shortness of breath EXAM: CHEST - 2 VIEW COMPARISON:  03/04/2021 FINDINGS: The heart size and mediastinal contours are within normal limits. Mild, diffuse bilateral interstitial pulmonary opacity. Disc degenerative disease of the thoracic spine IMPRESSION: Mild, diffuse bilateral interstitial pulmonary opacity, likely edema. No focal airspace opacity. Electronically Signed   By: Delanna Ahmadi M.D.   On: 08/16/2021 12:40    EKG: Independently reviewed.  Sinus rhythm at 67 bpm.  PVC noted.  Baseline artifact.  Nonspecific T wave flattening.  Assessment/Plan Principal Problem:   CHF (congestive heart failure) (HCC) Active Problems:   Type 2 diabetes mellitus with diabetic polyneuropathy, with long-term current use of insulin (HCC)   GERD (gastroesophageal reflux disease)   OSA (obstructive sleep apnea)   Pulmonary hypertension (HCC)   Persistent atrial fibrillation (HCC)   Mixed diabetic hyperlipidemia associated with type 2 diabetes mellitus ([place])  CHF exacerbation Pulmonary hypertension > Last echo June of this year with EF 61-60% grade 1 diastolic dysfunction. > Patient presenting with shortness of breath for the  past several days and need to increase home oxygen from 3 to 4 L.  Was desaturating to 80% with ambulation at home per her report. > Also with new edema and nonproductive cough. > BNP mildly elevated at 190.  Lab work otherwise benign other than hemoglobin of 8 which is near baseline of 8.5.  Chest x-ray did show mild diffuse interstitial opacities consistent with edema with no focal opacities. - Monitor on telemetry - 40 mg Lasix IV twice daily - Strict I's/O, daily weights - Check magnesium - Fluid restricted diet - Repeat echocardiogram  Atrial fibrillation - Continue home diltiazem, Tikosyn, Eliquis  Hypertension - Continue home diltiazem, losartan - Lasix as above  GERD - Continue PPI  Hyperlipidemia - Continue home Zetia  Diabetes > 45 units daily at home 30 units daily - SSI   OSA - Not on CPAP  DVT prophylaxis: Eliquis  Code Status:   Full  Family Communication:  None on admission.  Patient states that she has updated her family by phone. Disposition Plan:   Patient is from:  Home  Anticipated DC to:  Home  Anticipated DC date:  To 3 days  Anticipated DC barriers: None  Consults called:  None  Admission status:  Inpatient, telemetry Severity of Illness: The appropriate patient status for this patient is INPATIENT. Inpatient status is  judged to be reasonable and necessary in order to provide the required intensity of service to ensure the patient's safety. The patient's presenting symptoms, physical exam findings, and initial radiographic and laboratory data in the context of their chronic comorbidities is felt to place them at high risk for further clinical deterioration. Furthermore, it is not anticipated that the patient will be medically stable for discharge from the hospital within 2 midnights of admission.   * I certify that at the point of admission it is my clinical judgment that the patient will require inpatient hospital care spanning beyond 2 midnights  from the point of admission due to high intensity of service, high risk for further deterioration and high frequency of surveillance required.Marcelyn Bruins MD [facility]  How to contact the [facility] Attending or Consulting provider [place] or covering provider during after hours [place], for this patient?   Check the care team in [facility] and look for a) attending/consulting [facility] provider listed and b) the [facility] team listed Log into [email or web address] and use [place]'s universal password to access. If you do not have the password, please contact the hospital operator. Locate the [facility] provider you are looking for under [facility] and page to a number that you can be directly reached. If you still have difficulty reaching the provider, please page the [facility] (Director on Call) for the Hospitalists listed on amion for assistance.  08/16/2021, 9:59 PM

## 2021-08-16 NOTE — ED Provider Notes (Signed)
[facility] EMERGENCY DEPT Provider Note   CSN: [id number] Arrival date & time: 08/16/21  1148     History Chief Complaint  Patient presents with   Shortness of Breath    Debbie [name] is a 75 y.o. female.   Shortness of Breath Associated symptoms: cough   Associated symptoms: no abdominal pain and no rash   Patient presents with shortness of breath.  Has had for the last few days.  He is on chronic oxygen at 3 L.  States her normal sats will be about 94%.  States she is moved up to 4 L and is more short of breath.  States with any exertion her sats will run down into the 80s.  States she cannot walk out to the car like she normally be able to.  Does have some swelling in her leg.  Has had a little bit of a cough without much sputum production.  Reviewing records appears she has pulmonary hypertension.  No fevers.  No known sick contacts.    Past Medical History:  Diagnosis Date   A-fib ([place]) 11/15/2019   Abnormal liver function    Adenomatous polyp 12/04/2006   AKI (acute kidney injury) ([place])    Asthma    COPD (chronic obstructive pulmonary disease) ([place])    DM type 2 (diabetes mellitus, type 2) (HCC)    GERD (gastroesophageal reflux disease) 02/28/2012   Hemorrhoid 12/04/2006   Hyperlipidemia    Hypertension    Hypomagnesemia    Hypotension 09/21/2019   OSA (obstructive sleep apnea) 09/23/2014   NPSG 08/2014:  AHI 9/hr  But Completely intolerant of cpap.   - see chronic resp failure    Peripheral arterial disease ([place])    Persistent atrial fibrillation ([place]) 11/15/2019   Pulmonary hypertension ([place]) 03/28/2017   Echo 04/04/17 Compared to a prior study in 2015,   there is now moderate LVH and the LVEF is higher at 65-70%. No   obvious PFO noted by saline microbubble contrast. There is   moderate TR with an RVSP of 56 mmHg and a normal, collapsing IVC.   Consistent with moderate pulmonary hypertension.   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   Onset around 2019  - rec rx for IBS/ diet 06/29/2019     Patient Active Problem List   Diagnosis Date Noted   Hematuria, undiagnosed cause with recurrent uti  06/19/2021   Urinary tract infection associated with catheterization of urinary tract, initial encounter ([place]) 03/04/2021   Hypokalemia 03/04/2021   Mixed diabetic hyperlipidemia associated with type 2 diabetes mellitus ([place]) 03/04/2021   Nonsustained ventricular tachycardia 03/04/2021   Persistent atrial fibrillation ([place]) 11/15/2019   Abnormal liver function    Hypomagnesemia    Hx of right BKA (HCC)    Obesity (BMI 30.0-34.9)    Hypotension 09/21/2019   AKI (acute kidney injury) ([place])    Sepsis ([place]) 09/20/2019   Acute lower UTI 09/20/2019   Atrial fibrillation with RVR ([place]) 09/20/2019   Septic shock ([place]) 09/20/2019   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   Pulmonary hypertension ([place]) 03/28/2017   Morbid obesity due to excess calories ([place]) 09/29/2015   OSA (obstructive sleep apnea) 09/23/2014   Atherosclerosis of native arteries of extremity with intermittent claudication ([place]) 08/05/2014   Chronic respiratory failure with hypoxia ([place]) 04/25/2014   DOE (dyspnea on exertion) 04/01/2014   Encounter for  colonoscopy due to history of adenomatous colonic polyps 02/28/2012   GERD (gastroesophageal reflux disease) 02/28/2012   Type 2 diabetes mellitus with diabetic polyneuropathy, with long-term current use of insulin (HCC)    COPD (chronic obstructive pulmonary disease) ([place])    Essential hypertension     Past Surgical History:  Procedure Laterality Date   CARDIOVERSION N/A 10/18/2019   Procedure: CARDIOVERSION;  Surgeon: [name], MD;  Location: [facility];  Service: Cardiovascular;  Laterality: N/A;   CHOLECYSTECTOMY     COLONOSCOPY  12/03/2006   Dr. Delight Ovens, adenomatous polyp   COLONOSCOPY  03/25/2012   Procedure: COLONOSCOPY;  Surgeon:  Daneil Dolin, MD;  [facility];  Service: Endoscopy;  Laterality: N/A;  10:30   ESOPHAGOGASTRODUODENOSCOPY  11/03/2002   Dr. Gala Romney- normal exam- was done to check for possible foreign body   Fiberoptic bronchoscopy with endobronchial  ultrasound  10/22/2010   [place]   Right BKA  1990   RIGHT HEART CATHETERIZATION N/A 06/22/2014   Procedure: RIGHT HEART CATH;  Surgeon: Larey Dresser, MD;  Location: [facility] CATH LAB;  Service: Cardiovascular;  Laterality: N/A;     OB History     Gravida      Para      Term      Preterm      AB      Living  3      SAB      IAB      Ectopic      Multiple      Live Births  3           Family History  Problem Relation Age of Onset   COPD Sister    Diabetes Sister    Breast cancer Sister        Mastectomy   CVA Mother 56   Heart disease Mother    Diabetes Mother 78   Lung cancer Father        lung carcinoma   Colon cancer Neg Hx     Social History   Tobacco Use   Smoking status: Former    Packs/day: 0.50    Years: 18.00    Pack years: 9.00    Types: Cigarettes    Quit date: 09/16/1990    Years since quitting: 30.9   Smokeless tobacco: Never  Vaping Use   Vaping Use: Never used  Substance Use Topics   Alcohol use: No    Alcohol/week: 0.0 standard drinks   Drug use: No    Home Medications Prior to Admission medications   Medication Sig Start Date End Date Taking? Authorizing Provider  apixaban (ELIQUIS) 5 MG TABS tablet Take 1 tablet (5 mg total) by mouth 2 (two) times daily. 09/24/19  Yes Florencia Reasons, MD  Calcium Carb-Cholecalciferol (CALCIUM 1000 + D PO) Take 1,000 mg by mouth daily.   Yes [provider]  Coenzyme Q10 400 MG CAPS Take 400 mg by mouth daily.    Yes [provider]  dexlansoprazole (DEXILANT) 60 MG capsule Take 1 capsule (60 mg total) by mouth daily. 09/29/15  Yes Tanda Rockers, MD  diltiazem (CARDIZEM CD) 240 MG 24 hr capsule Take 1 capsule (240 mg total) by mouth daily.  09/25/19  Yes Florencia Reasons, MD  dofetilide [facility]) 250 MCG capsule TAKE 1 CAPSULE BY MOUTH TWICE A DAY 09/12/20  Yes Evans Lance, MD  estradiol (ESTRACE) 0.1 MG/GM vaginal cream Place vaginally. 04/19/21  Yes [provider]  ezetimibe (ZETIA) 10 MG tablet Take 10 mg by mouth every evening.  08/27/19  Yes [provider]  furosemide (LASIX) 40 MG tablet Take 0.5 tablets (20 mg total) by mouth daily as needed for fluid or edema. 09/24/19 08/16/21 Yes Florencia Reasons, MD  gabapentin (NEURONTIN) 100 MG capsule Take 100-200 mg by mouth See admin instructions. Take 200 mg in the morning and 100 mg at night   Yes [provider]  halobetasol (ULTRAVATE) 0.05 % cream Apply 1 application topically 2 (two) times daily as needed (psoriasis).  08/15/16  Yes [provider]  ibandronate (BONIVA) 150 MG tablet Take 150 mg by mouth every 30 (thirty) days. 02/15/20  Yes [provider]  Insulin Glargine (BASAGLAR KWIKPEN) 100 UNIT/ML SOPN Inject 45 Units into the skin daily before breakfast.   Yes [provider]  JANUVIA 100 MG tablet Take 100 mg by mouth daily. 04/05/21  Yes [provider]  ketotifen (ZADITOR) 0.025 % ophthalmic solution Place 1 drop into both eyes daily as needed (for irritation).   Yes [provider]  losartan (COZAAR) 100 MG tablet TAKE 1 TABLET BY MOUTH EVERY DAY 06/08/21  Yes Evans Lance, MD  magnesium oxide (MAG-OX) 400 (241.3 Mg) MG tablet Take 1 tablet (400 mg total) by mouth 2 (two) times daily. Please make yearly appt with Dr. [name] for April 2022 for future refills. Thank you 1st attempt Patient taking differently: Take 400 mg by mouth 2 (two) times daily. 10/18/20  Yes Evans Lance, MD  methenamine (HIPREX) 1 g tablet Take 1 g by mouth 2 (two) times daily. 05/17/21  Yes [provider]  tamsulosin (FLOMAX) 0.4 MG CAPS capsule Take 0.4 mg by mouth daily. 12/08/20  Yes [provider]  tiZANidine (ZANAFLEX) 4  MG tablet Take 4 mg by mouth at bedtime. 11/04/19  Yes [provider]  VOLTAREN 1 % GEL Apply 2 g topically 4 (four) times daily as needed (for arthritic pain). 08/13/17  Yes [provider]  acetaminophen (TYLENOL) 500 MG tablet Take 500 mg by mouth every 6 (six) hours as needed for mild pain (or headaches).    [provider]  BD PEN NEEDLE NANO U/F 32G X 4 MM MISC 2 (two) times daily. as directed 02/26/15   [provider]  fluticasone (FLONASE) 50 MCG/ACT nasal spray Place 1 spray into both nostrils daily as needed for allergies.    [provider]  HYDROcodone-acetaminophen (NORCO/VICODIN) 5-325 MG tablet Take 1 tablet by mouth every 4 (four) hours as needed. Patient not taking: Reported on 08/16/2021 07/22/21   Isla Pence, MD  Insulin Pen Needle 32G X 4 MM MISC USE TWICE DAILY AS DIRECTED 05/31/15   [provider]  Lidocaine-Glycerin (PREPARATION H EX) Place 1 application rectally 2 (two) times daily as needed (for pain).    [provider]  methocarbamol (ROBAXIN) 500 MG tablet Take 1 tablet (500 mg total) by mouth 2 (two) times daily as needed for muscle spasms. Patient not taking: Reported on 08/16/2021 07/22/21   Isla Pence, MD  OXYGEN Inhale 3 L/min into the lungs continuous.    [provider]    Allergies    Meloxicam  Review of Systems   Review of Systems  Constitutional:  Negative for appetite change.  HENT:  Negative for congestion.   Respiratory:  Positive for cough and shortness of breath.   Cardiovascular:  Positive for leg swelling.  Gastrointestinal:  Negative for abdominal pain.  Genitourinary:  Negative for flank pain.  Musculoskeletal:  Negative for back pain.  Skin:  Negative for rash.  Neurological:  Negative for weakness.  Psychiatric/Behavioral:  Negative for confusion.    Physical Exam Updated Vital Signs BP (!) 179/72   Pulse 68   Temp 97.6 F (36.4 C) (Oral)   Resp 17   Ht  5\' 9"  (1.753 m)   Wt 98 kg   SpO2 96%   BMI 31.91 kg/m   Physical Exam Vitals and nursing note reviewed.  HENT:     Head: Normocephalic.  Cardiovascular:     Rate and Rhythm: Regular rhythm.  Pulmonary:     Comments: Mildly harsh breath sounds without focal rales or rhonchi. Chest:     Chest wall: No tenderness.  Musculoskeletal:     Left lower leg: Edema present.     Comments: Moderate pitting edema left lower extremity.  Previous below the knee amputation on right.  Skin:    General: Skin is warm.     Capillary Refill: Capillary refill takes less than 2 seconds.  Neurological:     Mental Status: She is alert and oriented to person, place, and time.    ED Results / Procedures / Treatments   Labs (all labs ordered are listed, but only abnormal results are displayed) Labs Reviewed  BASIC METABOLIC PANEL - Abnormal; Notable for the following components:      Result Value   Glucose, Bld 101 (*)    All other components within normal limits  CBC - Abnormal; Notable for the following components:   RBC 3.83 (*)    Hemoglobin 8.0 (*)    HCT 27.9 (*)    MCV 72.8 (*)    MCH 20.9 (*)    MCHC 28.7 (*)    RDW 17.1 (*)    All other components within normal limits  BRAIN NATRIURETIC PEPTIDE - Abnormal; Notable for the following components:   B Natriuretic Peptide 190.1 (*)    All other components within normal limits  RESP PANEL BY RT-PCR (FLU A&B, COVID) [id number]  TROPONIN I (HIGH SENSITIVITY)  TROPONIN I (HIGH SENSITIVITY)    EKG EKG Interpretation  Date/Time:  Thursday August 16 2021 12:11:32 EST Ventricular Rate:  67 PR Interval:  170 QRS Duration: 76 QT Interval:  438 QTC Calculation: 462 R Axis:   -6 Text Interpretation: Sinus rhythm with Premature atrial complexes with Abberant conduction Cannot rule out Anterior infarct , age undetermined Abnormal ECG Confirmed by Davonna Belling [phone]) on 08/16/2021 1:07:32 PM  Radiology DG Chest 2 View  Result Date:  08/16/2021 CLINICAL DATA:  Shortness of breath EXAM: CHEST - 2 VIEW COMPARISON:  03/04/2021 FINDINGS: The heart size and mediastinal contours are within normal limits. Mild, diffuse bilateral interstitial pulmonary opacity. Disc degenerative disease of the thoracic spine IMPRESSION: Mild, diffuse bilateral interstitial pulmonary opacity, likely edema. No focal airspace opacity. Electronically Signed   By: Delanna Ahmadi M.D.   On: 08/16/2021 12:40    Procedures Procedures   Medications Ordered in ED Medications - No data to display  ED Course  I have reviewed the triage vital signs and the nursing notes.  Pertinent labs & imaging results that were available during my care of the patient were reviewed by me and considered in my medical decision making (see chart for details).    MDM Rules/Calculators/A&P  Patient with shortness of breath.  Has had for the last few days.  Occasional cough.  Is on 3 L oxygen at baseline due to pulmonary hypertension.  Has been off before and still having desaturation.  Cannot do the same activity that she can.  Has some apparent CHF on x-ray.  Does have edema on her legs.  Will require admission to the hospital.  Some Lasix was given here.  COVID test negative.   Final Clinical Impression(s) / ED Diagnoses Final diagnoses:  Pulmonary hypertension ([place])  Acute on chronic respiratory failure with hypoxemia [facility])    Rx / DC Orders ED Discharge Orders     None        Davonna Belling, MD 08/16/21 1556

## 2021-08-16 NOTE — ED Notes (Signed)
Patient transported to X-ray 

## 2021-08-17 ENCOUNTER — Other Ambulatory Visit: Payer: Self-pay

## 2021-08-17 ENCOUNTER — Encounter (HOSPITAL_COMMUNITY): Payer: Self-pay | Admitting: Internal Medicine

## 2021-08-17 ENCOUNTER — Inpatient Hospital Stay (HOSPITAL_COMMUNITY): Payer: Medicare Other

## 2021-08-17 DIAGNOSIS — I5033 Acute on chronic diastolic (congestive) heart failure: Secondary | ICD-10-CM | POA: Diagnosis not present

## 2021-08-17 DIAGNOSIS — I5043 Acute on chronic combined systolic (congestive) and diastolic (congestive) heart failure: Secondary | ICD-10-CM

## 2021-08-17 DIAGNOSIS — I272 Pulmonary hypertension, unspecified: Secondary | ICD-10-CM | POA: Diagnosis not present

## 2021-08-17 LAB — GLUCOSE, CAPILLARY
Glucose-Capillary: 87 mg/dL (ref 70–99)
Glucose-Capillary: 97 mg/dL (ref 70–99)
Glucose-Capillary: 114 mg/dL — ABNORMAL HIGH (ref 70–99)
Glucose-Capillary: 167 mg/dL — ABNORMAL HIGH (ref 70–99)
Glucose-Capillary: 200 mg/dL — ABNORMAL HIGH (ref 70–99)

## 2021-08-17 LAB — CBC
HCT: 29.2 % — ABNORMAL LOW (ref 36.0–46.0)
Hemoglobin: 8.6 g/dL — ABNORMAL LOW (ref 12.0–15.0)
MCH: 21.5 pg — ABNORMAL LOW (ref 26.0–34.0)
MCHC: 29.5 g/dL — ABNORMAL LOW (ref 30.0–36.0)
MCV: 73 fL — ABNORMAL LOW (ref 80.0–100.0)
Platelets: 303 10*3/uL (ref 150–400)
RBC: 4 MIL/uL (ref 3.87–5.11)
RDW: 16.9 % — ABNORMAL HIGH (ref 11.5–15.5)
WBC: 13.8 10*3/uL — ABNORMAL HIGH (ref 4.0–10.5)
nRBC: 0 % (ref 0.0–0.2)

## 2021-08-17 LAB — ECHOCARDIOGRAM COMPLETE
AR max vel: 2.16 cm2
AV Peak grad: 9.5 mmHg
Ao pk vel: 1.54 m/s
Area-P 1/2: 3.06 cm2
Calc EF: 62.1 %
Height: 69 in
S' Lateral: 2.5 cm
Single Plane A2C EF: 60.2 %
Single Plane A4C EF: 62.2 %
Weight: 3298.08 oz

## 2021-08-17 LAB — BASIC METABOLIC PANEL
Anion gap: 11 (ref 5–15)
BUN: 13 mg/dL (ref 8–23)
CO2: 26 mmol/L (ref 22–32)
Calcium: 9.3 mg/dL (ref 8.9–10.3)
Chloride: 102 mmol/L (ref 98–111)
Creatinine, Ser: 0.81 mg/dL (ref 0.44–1.00)
GFR, Estimated: >60 mL/min (ref >60)
Glucose, Bld: 93 mg/dL (ref 70–99)
Potassium: 3.7 mmol/L (ref 3.5–5.1)
Sodium: 139 mmol/L (ref 135–145)

## 2021-08-17 LAB — MAGNESIUM: Magnesium: 1.7 mg/dL (ref 1.7–2.4)

## 2021-08-17 MED ORDER — POTASSIUM CHLORIDE CRYS ER 20 MEQ PO TBCR
40.0000 meq | EXTENDED_RELEASE_TABLET | Freq: Once | ORAL | Status: AC
  Administered 2021-08-17: 40 meq via ORAL
  Filled 2021-08-17: qty 2

## 2021-08-17 MED ORDER — PERFLUTREN LIPID MICROSPHERE
1.0000 mL | INTRAVENOUS | Status: AC | PRN
  Administered 2021-08-17: 2 mL via INTRAVENOUS
  Filled 2021-08-17: qty 10

## 2021-08-17 MED ORDER — POTASSIUM CHLORIDE CRYS ER 20 MEQ PO TBCR
40.0000 meq | EXTENDED_RELEASE_TABLET | Freq: Every day | ORAL | Status: DC
  Administered 2021-08-17 – 2021-08-19 (×3): 40 meq via ORAL
  Filled 2021-08-17 (×3): qty 2

## 2021-08-17 MED ORDER — MAGNESIUM SULFATE 4 GM/100ML IV SOLN
4.0000 g | Freq: Once | INTRAVENOUS | Status: AC
  Administered 2021-08-17: 4 g via INTRAVENOUS
  Filled 2021-08-17: qty 100

## 2021-08-17 NOTE — Progress Notes (Signed)
PROGRESS NOTE    Debbie [name]  JKD:[id number] DOB: 01-24-1946 DOA: 08/16/2021 PCP: [name], MD    Brief Narrative:  Patient with history of pulmonary hypertension, chronic diastolic heart failure, paroxysmal A. 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  No exacerbating or relieving factors.  In the emergency room hemoglobin at about baseline.  Respiratory panel negative.  Chest x-ray with mild diffuse interstitial opacity consistent with edema.  Patient admitted with shortness of breath with baseline chronic hypoxemia.   Assessment & Plan:   Principal Problem:   CHF (congestive heart failure) (HCC) Active Problems:   Type 2 diabetes mellitus with diabetic polyneuropathy, with long-term current use of insulin (HCC)   GERD (gastroesophageal reflux disease)   OSA (obstructive sleep apnea)   Pulmonary hypertension (HCC)   Persistent atrial fibrillation (HCC)   Mixed diabetic hyperlipidemia associated with type 2 diabetes mellitus (HCC)  Acute on chronic hypoxemic respiratory failure, suspect exacerbation of pulmonary arterial hypertension and diastolic dysfunction: Patient currently on 4 L oxygen which is slightly worse than her usual levels.  Given oxygen to keep saturations more than 90% and mobilize. Will continue low-dose IV Lasix.  Recent echocardiogram with normal ejection fraction and grade 1 diastolic dysfunction. Aggressive electrolyte replacement with magnesium and potassium. Recheck echocardiogram today. Intake and output monitoring. Due to significant symptoms, cardiology consulted.  Paroxysmal A. fib: Currently in sinus rhythm.  She is on diltiazem, Tikosyn and therapeutic on Eliquis.  Essential hypertension: Blood pressure stable on diltiazem and losartan.  GERD: On PPI.  Type  2 diabetes on insulin with hyperglycemia: Blood sugars fairly stable today.  Continued home doses of insulin.   DVT prophylaxis:  apixaban (ELIQUIS) tablet 5 mg   Code Status: Full code Family Communication: None at the bedside. Disposition Plan: Status is: Inpatient  Remains inpatient appropriate because: IV diuresis.  Significant oxygen requirement.      Consultants:  Cardiology, consult called  Procedures:  None  Antimicrobials:  None   Subjective: Patient seen and examined.  Denies any excessive shortness of breath at rest.  Denies any chest pain or congestion today.  She has some cramping of the left leg.  Objective: Vitals:   08/16/21 1945 08/17/21 0013 08/17/21 0329 08/17/21 0758  BP: (!) 171/61 (!) 165/76 (!) 167/59 (!) 172/82  Pulse: 67 72 68 66  Resp: 16  18 17   Temp: 98 F (36.7 C) 98.2 F (36.8 C) 97.9 F (36.6 C) 97.8 F (36.6 C)  TempSrc: Oral Oral Oral Oral  SpO2: 96% 97% 98% 97%  Weight:   93.5 kg   Height:        Intake/Output Summary (Last 24 hours) at 08/17/2021 1129 Last data filed at 08/17/2021 0956 Gross per 24 hour  Intake 600 ml  Output 4000 ml  Net -3400 ml   Filed Weights   08/16/21 1201 08/17/21 0329  Weight: 98 kg 93.5 kg    Examination:  General exam: Appears calm and comfortable on 4 L oxygen. Respiratory system: No added sounds.  Some upper airway conducted sounds. Cardiovascular system: S1 & S2 heard, RRR. No JVD, murmurs, rubs, gallops or clicks.  Trace edema left lower extremity. Gastrointestinal system: Abdomen is nondistended, soft and nontender. No organomegaly or masses felt. Normal bowel sounds heard. Central nervous system: Alert and oriented.  No focal neurological deficits. Extremities: Symmetric 5 x 5 power. Skin: No rashes, lesions or ulcers Psychiatry: Judgement and insight appear normal. Mood & affect appropriate.     Data Reviewed: I have personally reviewed following labs and imaging  studies  CBC: Recent Labs  Lab 08/16/21 1210 08/17/21 0024  WBC 10.3 13.8*  HGB 8.0* 8.6*  HCT 27.9* 29.2*  MCV 72.8* 73.0*  PLT 290 902   Basic Metabolic Panel: Recent Labs  Lab 08/16/21 1210 08/17/21 0024  NA 142 139  K 3.6 3.7  CL 107 102  CO2 27 26  GLUCOSE 101* 93  BUN 15 13  CREATININE 0.75 0.81  CALCIUM 9.4 9.3  MG  --  1.7   GFR: Estimated Creatinine Clearance: 73 mL/min (by C-G formula based on SCr of 0.81 mg/dL). Liver Function Tests: No results for input(s): AST, ALT, ALKPHOS, BILITOT, PROT, ALBUMIN in the last 168 hours. No results for input(s): LIPASE, AMYLASE in the last 168 hours. No results for input(s): AMMONIA in the last 168 hours. Coagulation Profile: No results for input(s): INR, PROTIME in the last 168 hours. Cardiac Enzymes: No results for input(s): CKTOTAL, CKMB, CKMBINDEX, TROPONINI in the last 168 hours. BNP (last 3 results) No results for input(s): PROBNP in the last 8760 hours. HbA1C: No results for input(s): HGBA1C in the last 72 hours. CBG: Recent Labs  Lab 08/16/21 1755 08/16/21 1841 08/16/21 2058 08/17/21 0631  GLUCAP 60* 95 146* 97   Lipid Profile: No results for input(s): CHOL, HDL, LDLCALC, TRIG, CHOLHDL, LDLDIRECT in the last 72 hours. Thyroid Function Tests: No results for input(s): TSH, T4TOTAL, FREET4, T3FREE, THYROIDAB in the last 72 hours. Anemia Panel: No results for input(s): VITAMINB12, FOLATE, FERRITIN, TIBC, IRON, RETICCTPCT in the last 72 hours. Sepsis Labs: No results for input(s): PROCALCITON, LATICACIDVEN in the last 168 hours.  Recent Results (from the past 240 hour(s))  Resp Panel by RT-PCR (Flu A&B, Covid) Nasopharyngeal Swab     Status: None   Collection Time: 08/16/21 12:34 PM   Specimen: Nasopharyngeal Swab; Nasopharyngeal(NP) swabs in vial transport medium  Result Value Ref Range Status   SARS Coronavirus 2 by RT PCR NEGATIVE NEGATIVE Final    Comment: (NOTE) SARS-CoV-2 target nucleic acids are  NOT DETECTED.  The SARS-CoV-2 RNA is generally detectable in upper respiratory specimens during the acute phase of infection. The lowest concentration of SARS-CoV-2 viral copies this assay can detect is 138 copies/mL. A negative result does not preclude SARS-Cov-2 infection and should not be used as the sole basis for treatment or other patient management decisions. A negative result may occur with  improper specimen collection/handling, submission of specimen other than nasopharyngeal swab, presence of viral mutation(s) within the areas targeted by this assay, and inadequate number of viral copies(<138 copies/mL). A negative result must be combined with clinical observations, patient history, and epidemiological information. The expected result is Negative.  Fact Sheet for Patients:  EntrepreneurPulse.com.au  Fact Sheet for Healthcare Providers:  IncredibleEmployment.be  This test is no t yet approved or cleared by the [place] FDA and  has been authorized for detection and/or diagnosis of SARS-CoV-2 by FDA under an Emergency Use Authorization (EUA). This EUA will remain  in effect (meaning this test can be used) for the duration of the COVID-19 declaration under Section 564(b)(1) of the Act, 21 U.S.C.section 360bbb-3(b)(1), unless the authorization is terminated  or revoked sooner.       Influenza A by PCR NEGATIVE NEGATIVE Final  Influenza B by PCR NEGATIVE NEGATIVE Final    Comment: (NOTE) The Xpert Xpress SARS-CoV-2/FLU/RSV plus assay is intended as an aid in the diagnosis of influenza from Nasopharyngeal swab specimens and should not be used as a sole basis for treatment. Nasal washings and aspirates are unacceptable for Xpert Xpress SARS-CoV-2/FLU/RSV testing.  Fact Sheet for Patients: EntrepreneurPulse.com.au  Fact Sheet for Healthcare Providers: IncredibleEmployment.be  This test is not  yet approved or cleared by the [place] FDA and has been authorized for detection and/or diagnosis of SARS-CoV-2 by FDA under an Emergency Use Authorization (EUA). This EUA will remain in effect (meaning this test can be used) for the duration of the COVID-19 declaration under Section 564(b)(1) of the Act, 21 U.S.C. section 360bbb-3(b)(1), unless the authorization is terminated or revoked.  Performed at [facility], [address], [place], [place] [address]          Radiology Studies: DG Chest 2 View  Result Date: 08/16/2021 CLINICAL DATA:  Shortness of breath EXAM: CHEST - 2 VIEW COMPARISON:  03/04/2021 FINDINGS: The heart size and mediastinal contours are within normal limits. Mild, diffuse bilateral interstitial pulmonary opacity. Disc degenerative disease of the thoracic spine IMPRESSION: Mild, diffuse bilateral interstitial pulmonary opacity, likely edema. No focal airspace opacity. Electronically Signed   By: Delanna Ahmadi M.D.   On: 08/16/2021 12:40        Scheduled Meds:  apixaban  5 mg Oral BID   diltiazem  240 mg Oral Daily   dofetilide  250 mcg Oral BID   ezetimibe  10 mg Oral QPM   furosemide  40 mg Intravenous BID   gabapentin  100 mg Oral QHS   gabapentin  200 mg Oral Daily   insulin aspart  0-15 Units Subcutaneous TID WC   insulin glargine-yfgn  30 Units Subcutaneous Daily   losartan  100 mg Oral Daily   pantoprazole  40 mg Oral Daily   potassium chloride  40 mEq Oral Daily   potassium chloride  40 mEq Oral Once   sodium chloride flush  3 mL Intravenous Q12H   tamsulosin  0.4 mg Oral Daily   Continuous Infusions:  magnesium sulfate bolus IVPB       LOS: 1 day    Time spent: 30 minutes    Barb Merino, MD [facility] Pager [phone]

## 2021-08-18 DIAGNOSIS — J9621 Acute and chronic respiratory failure with hypoxia: Secondary | ICD-10-CM

## 2021-08-18 DIAGNOSIS — E1169 Type 2 diabetes mellitus with other specified complication: Secondary | ICD-10-CM | POA: Diagnosis not present

## 2021-08-18 DIAGNOSIS — I4819 Other persistent atrial fibrillation: Secondary | ICD-10-CM | POA: Diagnosis not present

## 2021-08-18 DIAGNOSIS — E1142 Type 2 diabetes mellitus with diabetic polyneuropathy: Secondary | ICD-10-CM | POA: Diagnosis not present

## 2021-08-18 LAB — BASIC METABOLIC PANEL
Anion gap: 7 (ref 5–15)
BUN: 16 mg/dL (ref 8–23)
CO2: 29 mmol/L (ref 22–32)
Calcium: 9.1 mg/dL (ref 8.9–10.3)
Chloride: 100 mmol/L (ref 98–111)
Creatinine, Ser: 0.82 mg/dL (ref 0.44–1.00)
GFR, Estimated: >60 mL/min (ref >60)
Glucose, Bld: 135 mg/dL — ABNORMAL HIGH (ref 70–99)
Potassium: 4.2 mmol/L (ref 3.5–5.1)
Sodium: 136 mmol/L (ref 135–145)

## 2021-08-18 LAB — GLUCOSE, CAPILLARY
Glucose-Capillary: 147 mg/dL — ABNORMAL HIGH (ref 70–99)
Glucose-Capillary: 164 mg/dL — ABNORMAL HIGH (ref 70–99)
Glucose-Capillary: 166 mg/dL — ABNORMAL HIGH (ref 70–99)
Glucose-Capillary: 162 mg/dL — ABNORMAL HIGH (ref 70–99)

## 2021-08-18 LAB — PHOSPHORUS: Phosphorus: 5 mg/dL — ABNORMAL HIGH (ref 2.5–4.6)

## 2021-08-18 LAB — MAGNESIUM: Magnesium: 2.5 mg/dL — ABNORMAL HIGH (ref 1.7–2.4)

## 2021-08-18 MED ORDER — METHENAMINE HIPPURATE 1 G PO TABS
1.0000 g | ORAL_TABLET | Freq: Two times a day (BID) | ORAL | Status: DC
Start: 2021-08-18 — End: 2021-08-18

## 2021-08-18 MED ORDER — ORAL CARE MOUTH RINSE
15.0000 mL | Freq: Two times a day (BID) | OROMUCOSAL | Status: DC
  Administered 2021-08-18: 15 mL via OROMUCOSAL

## 2021-08-18 NOTE — Progress Notes (Signed)
PROGRESS NOTE    Debbie [name]  WEX:[id number] DOB: Aug 15, 1946 DOA: 08/16/2021 PCP: [name], MD    Brief Narrative:  Patient with history of pulmonary hypertension, chronic diastolic heart failure, paroxysmal A. 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  No exacerbating or relieving factors.  In the emergency room hemoglobin at about baseline.  Respiratory panel negative.  Chest x-ray with mild diffuse interstitial opacity consistent with edema.  Patient admitted with shortness of breath with baseline chronic hypoxemia.   Assessment & Plan:   Principal Problem:   CHF (congestive heart failure) (HCC) Active Problems:   Type 2 diabetes mellitus with diabetic polyneuropathy, with long-term current use of insulin (HCC)   GERD (gastroesophageal reflux disease)   OSA (obstructive sleep apnea)   Pulmonary hypertension (HCC)   Persistent atrial fibrillation (HCC)   Mixed diabetic hyperlipidemia associated with type 2 diabetes mellitus ([place])   Acute on chronic hypoxemic respiratory failure, suspect exacerbation of pulmonary arterial hypertension and diastolic dysfunction: -continue low-dose IV Lasix.  Recent echocardiogram with normal ejection fraction and grade 1 diastolic dysfunction. -Aggressive electrolyte replacement with magnesium and potassium. -echocardiogram: There is severe asymmetric left ventricular hypertrophy of the basal-septal segment. Intake and output monitoring. -cardiology consulted.  Paroxysmal A. fib: Currently in sinus rhythm.  She is on diltiazem, Tikosyn and therapeutic on Eliquis.  Essential hypertension: Blood pressure stable on diltiazem and losartan.  GERD: On PPI.  Type 2 diabetes on insulin with hyperglycemia:  -Continued home doses of insulin. -SSI   DVT  prophylaxis:  apixaban (ELIQUIS) tablet 5 mg   Code Status: Full code  Disposition Plan: Status is: Inpatient  Remains inpatient appropriate because: IV diuresis      Consultants:  Cardiology     Subjective: Breathing better today than yesterday  Objective: Vitals:   08/17/21 1552 08/17/21 1927 08/18/21 0448 08/18/21 0831  BP: 134/67 (!) 149/78 (!) 163/69 (!) 152/70  Pulse: 82 67 62   Resp: 17 19 17 18   Temp: (!) 97.5 F (36.4 C) (!) 97.5 F (36.4 C) 97.7 F (36.5 C) 98.3 F (36.8 C)  TempSrc: Oral Oral Oral Oral  SpO2: 97% 95% 99% 99%  Weight:   92.4 kg   Height:        Intake/Output Summary (Last 24 hours) at 08/18/2021 1137 Last data filed at 08/18/2021 1111 Gross per 24 hour  Intake 1141.7 ml  Output 2400 ml  Net -1258.3 ml   Filed Weights   08/16/21 1201 08/17/21 0329 08/18/21 0448  Weight: 98 kg 93.5 kg 92.4 kg    Examination:  General: Appearance:    Obese female in no acute distress     Lungs:     On [place], able to speak in complete sentences, respirations unlabored  Heart:    Normal heart rate.   MS:   Below knee amputation of right lower extremity is noted.    Neurologic:   Awake, alert, oriented x 3. No apparent focal neurological           defect.       Data Reviewed: I have personally reviewed following labs and imaging studies  CBC: Recent Labs  Lab 08/16/21 1210 08/17/21 0024  WBC 10.3 13.8*  HGB 8.0* 8.6*  HCT 27.9* 29.2*  MCV 72.8* 73.0*  PLT 290  270   Basic Metabolic Panel: Recent Labs  Lab 08/16/21 1210 08/17/21 0024 08/18/21 0331  NA 142 139 136  K 3.6 3.7 4.2  CL 107 102 100  CO2 27 26 29   GLUCOSE 101* 93 135*  BUN 15 13 16   CREATININE 0.75 0.81 0.82  CALCIUM 9.4 9.3 9.1  MG  --  1.7 2.5*  PHOS  --   --  5.0*   GFR: Estimated Creatinine Clearance: 71.8 mL/min (by C-G formula based on SCr of 0.82 mg/dL). Liver Function Tests: No results for input(s): AST, ALT, ALKPHOS, BILITOT, PROT, ALBUMIN in the last 168  hours. No results for input(s): LIPASE, AMYLASE in the last 168 hours. No results for input(s): AMMONIA in the last 168 hours. Coagulation Profile: No results for input(s): INR, PROTIME in the last 168 hours. Cardiac Enzymes: No results for input(s): CKTOTAL, CKMB, CKMBINDEX, TROPONINI in the last 168 hours. BNP (last 3 results) No results for input(s): PROBNP in the last 8760 hours. HbA1C: No results for input(s): HGBA1C in the last 72 hours. CBG: Recent Labs  Lab 08/17/21 0631 08/17/21 1207 08/17/21 1554 08/17/21 2110 08/18/21 0606  GLUCAP 97 114* 167* 200* 147*   Lipid Profile: No results for input(s): CHOL, HDL, LDLCALC, TRIG, CHOLHDL, LDLDIRECT in the last 72 hours. Thyroid Function Tests: No results for input(s): TSH, T4TOTAL, FREET4, T3FREE, THYROIDAB in the last 72 hours. Anemia Panel: No results for input(s): VITAMINB12, FOLATE, FERRITIN, TIBC, IRON, RETICCTPCT in the last 72 hours. Sepsis Labs: No results for input(s): PROCALCITON, LATICACIDVEN in the last 168 hours.  Recent Results (from the past 240 hour(s))  Resp Panel by RT-PCR (Flu A&B, Covid) Nasopharyngeal Swab     Status: None   Collection Time: 08/16/21 12:34 PM   Specimen: Nasopharyngeal Swab; Nasopharyngeal(NP) swabs in vial transport medium  Result Value Ref Range Status   SARS Coronavirus 2 by RT PCR NEGATIVE NEGATIVE Final    Comment: (NOTE) SARS-CoV-2 target nucleic acids are NOT DETECTED.  The SARS-CoV-2 RNA is generally detectable in upper respiratory specimens during the acute phase of infection. The lowest concentration of SARS-CoV-2 viral copies this assay can detect is 138 copies/mL. A negative result does not preclude SARS-Cov-2 infection and should not be used as the sole basis for treatment or other patient management decisions. A negative result may occur with  improper specimen collection/handling, submission of specimen other than nasopharyngeal swab, presence of viral mutation(s)  within the areas targeted by this assay, and inadequate number of viral copies(<138 copies/mL). A negative result must be combined with clinical observations, patient history, and epidemiological information. The expected result is Negative.  Fact Sheet for Patients:  EntrepreneurPulse.com.au  Fact Sheet for Healthcare Providers:  IncredibleEmployment.be  This test is no t yet approved or cleared by the [place] FDA and  has been authorized for detection and/or diagnosis of SARS-CoV-2 by FDA under an Emergency Use Authorization (EUA). This EUA will remain  in effect (meaning this test can be used) for the duration of the COVID-19 declaration under Section 564(b)(1) of the Act, 21 U.S.C.section 360bbb-3(b)(1), unless the authorization is terminated  or revoked sooner.       Influenza A by PCR NEGATIVE NEGATIVE Final   Influenza B by PCR NEGATIVE NEGATIVE Final    Comment: (NOTE) The Xpert Xpress SARS-CoV-2/FLU/RSV plus assay is intended as an aid in the diagnosis of influenza from Nasopharyngeal swab specimens and should not be used as a sole basis for treatment. Nasal washings and aspirates  are unacceptable for Xpert Xpress SARS-CoV-2/FLU/RSV testing.  Fact Sheet for Patients: EntrepreneurPulse.com.au  Fact Sheet for Healthcare Providers: IncredibleEmployment.be  This test is not yet approved or cleared by the [place] FDA and has been authorized for detection and/or diagnosis of SARS-CoV-2 by FDA under an Emergency Use Authorization (EUA). This EUA will remain in effect (meaning this test can be used) for the duration of the COVID-19 declaration under Section 564(b)(1) of the Act, 21 U.S.C. section 360bbb-3(b)(1), unless the authorization is terminated or revoked.  Performed at [facility], [address] [place], [place] [address]          Radiology  Studies: DG Chest 2 View  Result Date: 08/16/2021 CLINICAL DATA:  Shortness of breath EXAM: CHEST - 2 VIEW COMPARISON:  03/04/2021 FINDINGS: The heart size and mediastinal contours are within normal limits. Mild, diffuse bilateral interstitial pulmonary opacity. Disc degenerative disease of the thoracic spine IMPRESSION: Mild, diffuse bilateral interstitial pulmonary opacity, likely edema. No focal airspace opacity. Electronically Signed   By: Delanna Ahmadi M.D.   On: 08/16/2021 12:40   ECHOCARDIOGRAM COMPLETE  Result Date: 08/17/2021    ECHOCARDIOGRAM REPORT   Patient Name:   RENE GONSOULIN [facility] Date of Exam: 08/17/2021 Medical Rec #:  [id number]         Height:       69.0 in Accession #:    [id number]        Weight:       206.1 lb Date of Birth:  [birth year]         BSA:          2.093 m Patient Age:    56 years          BP:           165/76 mmHg Patient Gender: F                 HR:           84 bpm. Exam Location:  Inpatient Procedure: 2D Echo, Color Doppler, Cardiac Doppler and Intracardiac            Opacification Agent Indications:    CHF  History:        Patient has prior history of Echocardiogram examinations. CHF,                 Pulmonary HTN, Arrythmias:Atrial Fibrillation; Risk                 Factors:Diabetes and Sleep Apnea.  Sonographer:    Jyl Heinz Referring Phys: [id number] [place]  1. Left ventricular ejection fraction, by estimation, is 60 to 65%. The left ventricle has normal function. The left ventricle has no regional wall motion abnormalities. There is severe asymmetric left ventricular hypertrophy of the basal-septal segment. Left ventricular diastolic parameters are indeterminate.  2. Right ventricular systolic function is normal. The right ventricular size is normal. There is mildly elevated pulmonary artery systolic pressure. The estimated right ventricular systolic pressure is 00.9 mmHg.  3. The mitral valve is normal in structure. No evidence of mitral valve  regurgitation. No evidence of mitral stenosis.  4. The aortic valve is tricuspid. Aortic valve regurgitation is not visualized. Aortic valve sclerosis/calcification is present, without any evidence of aortic stenosis.  5. The inferior vena cava is normal in size with greater than 50% respiratory variability, suggesting right atrial pressure of 3 mmHg. FINDINGS  Left Ventricle: Left ventricular ejection fraction, by estimation, is 60  to 65%. The left ventricle has normal function. The left ventricle has no regional wall motion abnormalities. The left ventricular internal cavity size was normal in size. There is  severe asymmetric left ventricular hypertrophy of the basal-septal segment. Left ventricular diastolic parameters are indeterminate. Right Ventricle: The right ventricular size is normal. Right vetricular wall thickness was not well visualized. Right ventricular systolic function is normal. There is mildly elevated pulmonary artery systolic pressure. The tricuspid regurgitant velocity  is 2.96 m/s, and with an assumed right atrial pressure of 3 mmHg, the estimated right ventricular systolic pressure is 99.3 mmHg. Left Atrium: Left atrial size was normal in size. Right Atrium: Right atrial size was normal in size. Pericardium: There is no evidence of pericardial effusion. Mitral Valve: The mitral valve is normal in structure. No evidence of mitral valve regurgitation. No evidence of mitral valve stenosis. Tricuspid Valve: The tricuspid valve is normal in structure. Tricuspid valve regurgitation is trivial. Aortic Valve: The aortic valve is tricuspid. Aortic valve regurgitation is not visualized. Aortic valve sclerosis/calcification is present, without any evidence of aortic stenosis. Aortic valve peak gradient measures 9.5 mmHg. Pulmonic Valve: The pulmonic valve was not well visualized. Pulmonic valve regurgitation is not visualized. Aorta: The aortic root and ascending aorta are structurally normal, with no  evidence of dilitation. Venous: The inferior vena cava is normal in size with greater than 50% respiratory variability, suggesting right atrial pressure of 3 mmHg. IAS/Shunts: The interatrial septum was not well visualized.  LEFT VENTRICLE PLAX 2D LVIDd:         4.40 cm      Diastology LVIDs:         2.50 cm      LV e' medial:    4.57 cm/s LV PW:         1.20 cm      LV E/e' medial:  10.8 LV IVS:        1.20 cm      LV e' lateral:   8.27 cm/s LVOT diam:     2.00 cm      LV E/e' lateral: 6.0 LV SV:         58 LV SV Index:   28 LVOT Area:     3.14 cm  LV Volumes (MOD) LV vol d, MOD A2C: 98.5 ml LV vol d, MOD A4C: 108.0 ml LV vol s, MOD A2C: 39.2 ml LV vol s, MOD A4C: 40.8 ml LV SV MOD A2C:     59.3 ml LV SV MOD A4C:     108.0 ml LV SV MOD BP:      66.9 ml RIGHT VENTRICLE             IVC RV Basal diam:  3.20 cm     IVC diam: 1.70 cm RV Mid diam:    2.10 cm RV S prime:     10.30 cm/s TAPSE (M-mode): 1.8 cm LEFT ATRIUM             Index        RIGHT ATRIUM           Index LA diam:        3.50 cm 1.67 cm/m   RA Area:     16.30 cm LA Vol (A2C):   27.8 ml 13.28 ml/m  RA Volume:   35.00 ml  16.72 ml/m LA Vol (A4C):   42.9 ml 20.50 ml/m LA Biplane Vol: 35.1 ml 16.77 ml/m  AORTIC VALVE AV Area (Vmax):  2.16 cm AV Vmax:        154.00 cm/s AV Peak Grad:   9.5 mmHg LVOT Vmax:      106.00 cm/s LVOT Vmean:     74.800 cm/s LVOT VTI:       0.185 m  AORTA Ao Root diam: 2.70 cm Ao Asc diam:  3.30 cm MITRAL VALVE               TRICUSPID VALVE MV Area (PHT): 3.06 cm    TR Peak grad:   35.0 mmHg MV Decel Time: 248 msec    TR Vmax:        296.00 cm/s MV E velocity: 49.40 cm/s MV A velocity: 61.00 cm/s  SHUNTS MV E/A ratio:  0.81        Systemic VTI:  0.18 m                            Systemic Diam: 2.00 cm Oswaldo Milian MD Electronically signed by Oswaldo Milian MD Signature Date/Time: 08/17/2021/4:15:33 PM    Final         Scheduled Meds:  apixaban  5 mg Oral BID   diltiazem  240 mg Oral Daily   dofetilide   250 mcg Oral BID   ezetimibe  10 mg Oral QPM   furosemide  40 mg Intravenous BID   gabapentin  100 mg Oral QHS   gabapentin  200 mg Oral Daily   insulin aspart  0-15 Units Subcutaneous TID WC   insulin glargine-yfgn  30 Units Subcutaneous Daily   losartan  100 mg Oral Daily   mouth rinse  15 mL Mouth Rinse BID   pantoprazole  40 mg Oral Daily   potassium chloride  40 mEq Oral Daily   sodium chloride flush  3 mL Intravenous Q12H   tamsulosin  0.4 mg Oral Daily   Continuous Infusions:     LOS: 2 days    Time spent: 30 minutes    Geradine Girt, DO [facility]

## 2021-08-18 NOTE — Consult Note (Addendum)
Cardiology Consultation:   Patient ID: Debbie [name] MRN: [id number]; DOB: 02/12/1946  Admit date: 08/16/2021 Date of Consult: 08/18/2021  PCP:  Debbie Noon, MD   [facility] Providers Cardiologist:  None  Electrophysiologist:  Debbie Peru, MD    Patient Profile:   Debbie [name] is a 75 y.o. female with a hx of  paroxysmal Afib, HTN, HLD, GERD, DM, PAH, HFpEF, s/p R BKA and COPD on chronic O2 who is being seen 08/18/2021 for the evaluation of CHF/PAH at the request of Dr. Eliseo [name].  History of Present Illness:   [name] is a 75 year old female with past medical history noted above.  She has been followed by Dr. Lovena [name] as an outpatient in regards to her atrial fibrillation. She was started on Tikosyn back in 11/2019 and has tolerated well.  Dose was reduced to 250 mcg twice a day in the setting of QT prolongation.  Echo 02/2021 with LVEF of 60 to 65%, severe LVH, grade 1 diastolic dysfunction, normal RV, severely elevated pulmonary artery pressure, moderately dilated left atrium, no significant valvular disease noted.  She was last seen in the office with Dr. Lovena [name] on 06/06/2021 and reported being in her usual state of health at that time.  She was continued on Tikosyn as well as Eliquis without complications.  [facility] with complaints of dyspnea on exertion as well as edema.  Stated the week of Thanksgiving she began to develop progressive shortness of breath as well as abdominal/chest fullness and lower extremity edema in her left leg.  Normally uses 3 L nasal cannula chronically and had to increase her use to 4 L in the setting of dyspnea.  Symptoms lingered over the next several days.  Also noticed several pound weight gain.  She initially called her PCP office which directed her to her pulmonologist office.  There she was directed to the ER for further evaluation with concerns of cardiac etiology.  In the ED her labs showed sodium 142,  potassium 3.6, creatinine 0.75, BNP 190, high-sensitivity troponin 13>> 12, WBC 10.3, hemoglobin 8, negative respiratory panel.  Chest x-ray with mild diffuse bilateral pulmonary edema.  EKG showed sinus rhythm, baseline artifact with PVCs.  She was transferred to [facility] for further admission with plans for diuresis.  With patient she reports being on a as needed diuretic at home and has not used prior to admission.  States she has been compliant with all other home medications including Tikosyn and Eliquis.  Noted increased swelling to her left lower extremity as well as orthopnea/PND.  Denies any significant dietary indiscretion in regards to salt over the holiday.  States she does frequently drink water throughout the day as she experiences dry mouth on a regular basis.  States she feels her urine output has been adequate recently.  Echocardiogram 12/2 showed LVEF of 60 to 65% with no regional wall motion abnormality, severe LVH, mildly elevated pulmonary artery pressure, normal RV, no pericardial effusion or significant valvular disease.    Past Medical History:  Diagnosis Date   A-fib ([place]) 11/15/2019   Abnormal liver function    Adenomatous polyp 12/04/2006   AKI (acute kidney injury) ([place])    Asthma    DM type 2 (diabetes mellitus, type 2) ([place])    GERD (gastroesophageal reflux disease) 02/28/2012   Hemorrhoid 12/04/2006   Hyperlipidemia    Hypertension    Hypomagnesemia    Hypotension 09/21/2019   Peripheral arterial  disease ([place])    Persistent atrial fibrillation ([place]) 11/15/2019   Pulmonary hypertension ([place]) 03/28/2017   Echo 04/04/17 Compared to a prior study in 2015,   there is now moderate LVH and the LVEF is higher at 65-70%. No   obvious PFO noted by saline microbubble contrast. There is   moderate TR with an RVSP of 56 mmHg and a normal, collapsing IVC.   Consistent with moderate pulmonary hypertension.   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 Delight [name], adenomatous polyp   COLONOSCOPY  03/25/2012   Procedure: COLONOSCOPY;  Surgeon: Debbie Dolin, MD;  Location: [facility] ENDO SUITE;  Service: Endoscopy;  Laterality: N/A;  10:30   ESOPHAGOGASTRODUODENOSCOPY  11/03/2002   Dr. Gala [name]- normal exam- was done to check for possible foreign body   Fiberoptic bronchoscopy with endobronchial  ultrasound  10/22/2010   Debbie [name]   Right BKA  1990   RIGHT HEART CATHETERIZATION N/A 06/22/2014   Procedure: RIGHT HEART CATH;  Surgeon: Debbie Dresser, MD;  Location: [facility] CATH LAB;  Service: Cardiovascular;  Laterality: N/A;     Home Medications:  Prior to Admission medications   Medication Sig Start Date End Date Taking? Authorizing Provider  apixaban (ELIQUIS) 5 MG TABS tablet Take 1 tablet (5 mg total) by mouth 2 (two) times daily. 09/24/19  Yes Debbie Reasons, MD  Calcium Carb-Cholecalciferol (CALCIUM 1000 + D PO) Take 1,000 mg by mouth daily.   Yes [provider]  Coenzyme Q10 400 MG CAPS Take 400 mg by mouth daily.    Yes [provider]  dexlansoprazole (DEXILANT) 60 MG capsule Take 1 capsule (60 mg total) by mouth daily. 09/29/15  Yes Debbie Rockers, MD  diltiazem (CARDIZEM CD) 240 MG 24 hr capsule Take 1 capsule (240 mg total) by mouth daily. 09/25/19  Yes Debbie Reasons, MD  dofetilide [facility]) 250 MCG capsule TAKE 1 CAPSULE BY MOUTH TWICE A DAY 09/12/20  Yes Debbie Lance, MD  estradiol (ESTRACE) 0.1 MG/GM vaginal cream Place vaginally. 04/19/21  Yes [provider]  ezetimibe (ZETIA) 10 MG tablet Take 10 mg by mouth every evening.  08/27/19  Yes [provider]  furosemide (LASIX) 40 MG tablet Take 0.5 tablets (20 mg total) by mouth daily as needed for fluid or edema. 09/24/19 08/16/21 Yes Debbie Reasons, MD  gabapentin (NEURONTIN) 100 MG capsule Take 100-200 mg by mouth See admin instructions. Take 200 mg in the morning and 100 mg at night   Yes [provider]  halobetasol (ULTRAVATE) 0.05 % cream Apply 1 application topically 2 (two) times daily as needed (psoriasis).  08/15/16  Yes [provider]  ibandronate (BONIVA) 150 MG tablet Take 150 mg by mouth every 30 (thirty) days. 02/15/20  Yes [provider]  Insulin Glargine (BASAGLAR KWIKPEN) 100 UNIT/ML SOPN Inject 45 Units into the skin daily before breakfast.   Yes [provider]  JANUVIA 100 MG tablet Take 100 mg by mouth daily. 04/05/21  Yes [provider]  ketotifen (ZADITOR)  0.025 % ophthalmic solution Place 1 drop into both eyes daily as needed (for irritation).   Yes [provider]  losartan (COZAAR) 100 MG tablet TAKE 1 TABLET BY MOUTH EVERY DAY 06/08/21  Yes Debbie Lance, MD  magnesium oxide (MAG-OX) 400 (241.3 Mg) MG tablet Take 1 tablet (400 mg total) by mouth 2 (two) times daily. Please make yearly appt with Dr. Lovena [name] for April 2022 for future refills. Thank you 1st attempt Patient taking differently: Take 400 mg by mouth 2 (two) times daily. 10/18/20  Yes Debbie Lance, MD  methenamine (HIPREX) 1 g tablet Take 1 g by mouth 2 (two) times daily. 05/17/21  Yes [provider]  tamsulosin (FLOMAX) 0.4 MG CAPS capsule Take 0.4 mg by mouth daily. 12/08/20  Yes [provider]  tiZANidine (ZANAFLEX) 4 MG tablet Take 4 mg by mouth at bedtime. 11/04/19  Yes [provider]  VOLTAREN 1 % GEL Apply 2 g topically 4 (four) times daily as needed (for arthritic pain). 08/13/17  Yes [provider]  acetaminophen (TYLENOL) 500 MG tablet Take 500 mg by mouth every 6 (six) hours as needed for mild pain (or  headaches).    [provider]  BD PEN NEEDLE NANO U/F 32G X 4 MM MISC 2 (two) times daily. as directed 02/26/15   [provider]  fluticasone (FLONASE) 50 MCG/ACT nasal spray Place 1 spray into both nostrils daily as needed for allergies.    [provider]  HYDROcodone-acetaminophen (NORCO/VICODIN) 5-325 MG tablet Take 1 tablet by mouth every 4 (four) hours as needed. Patient not taking: Reported on 08/16/2021 07/22/21   Isla Pence, MD  Insulin Pen Needle 32G X 4 MM MISC USE TWICE DAILY AS DIRECTED 05/31/15   [provider]  Lidocaine-Glycerin (PREPARATION H EX) Place 1 application rectally 2 (two) times daily as needed (for pain).    [provider]  methocarbamol (ROBAXIN) 500 MG tablet Take 1 tablet (500 mg total) by mouth 2 (two) times daily as needed for muscle spasms. Patient not taking: Reported on 08/16/2021 07/22/21   Isla Pence, MD  OXYGEN Inhale 3 L/min into the lungs continuous.    [provider]    Inpatient Medications: Scheduled Meds:  apixaban  5 mg Oral BID   diltiazem  240 mg Oral Daily   dofetilide  250 mcg Oral BID   ezetimibe  10 mg Oral QPM   furosemide  40 mg Intravenous BID   gabapentin  100 mg Oral QHS   gabapentin  200 mg Oral Daily   insulin aspart  0-15 Units Subcutaneous TID WC   insulin glargine-yfgn  30 Units Subcutaneous Daily   losartan  100 mg Oral Daily   mouth rinse  15 mL Mouth Rinse BID   pantoprazole  40 mg Oral Daily   potassium chloride  40 mEq Oral Daily   sodium chloride flush  3 mL Intravenous Q12H   tamsulosin  0.4 mg Oral Daily   Continuous Infusions:  PRN Meds: acetaminophen **OR** acetaminophen, diclofenac Sodium, fluticasone, polyethylene glycol  Allergies:    Allergies  Allergen Reactions   Meloxicam Other (See Comments)    Causes excess Fluid buildup    Social History:   Social History   Socioeconomic History   Marital status: Married    Spouse name: Aleta Manternach   Number of children: 3   Years of education: Not on file   Highest education level: Not on file  Occupational  History   Occupation: retired    Fish farm manager: UNEMPLOYED  Tobacco Use   Smoking status: Former    Packs/day: 0.50    Years: 18.00    Pack years: 9.00    Types: Cigarettes    Quit date: 09/16/1990    Years since quitting: 30.9   Smokeless tobacco: Never  Vaping Use   Vaping Use: Never used  Substance and Sexual Activity   Alcohol use: No    Alcohol/week: 0.0 standard drinks   Drug use: No   Sexual activity: Not on file  Other Topics Concern   Not on file  Social History Narrative   Not on file   Social Determinants of Health   Financial Resource Strain: Not on file  Food Insecurity: Not on file  Transportation Needs: Not on file  Physical Activity: Not on file  Stress: Not on file  Social Connections: Not on file  Intimate Partner Violence: Not on file    Family History:    Family History  Problem Relation Age of Onset   CVA Mother 36   Heart disease Mother    Diabetes Mother 20   Lung cancer Father        lung carcinoma   COPD Sister    COPD Sister    Diabetes Sister    Breast cancer Sister        Mastectomy   Cancer Maternal Grandmother    Colon cancer Neg Hx      ROS:  Please see the history of present illness.   All other ROS reviewed and negative.     Physical Exam/Data:   Vitals:   08/17/21 1552 08/17/21 1927 08/18/21 0448 08/18/21 0831  BP: 134/67 (!) 149/78 (!) 163/69 (!) 152/70  Pulse: 82 67 62   Resp: 17 19 17 18   Temp: (!) 97.5 F (36.4 C) (!) 97.5 F (36.4 C) 97.7 F (36.5 C) 98.3 F (36.8 C)  TempSrc: Oral Oral Oral Oral  SpO2: 97% 95% 99% 99%  Weight:   92.4 kg   Height:        Intake/Output Summary (Last 24 hours) at 08/18/2021 0908 Last data filed at 08/18/2021 0805 Gross per 24 hour  Intake 1141.7 ml  Output 2500 ml  Net -1358.3 ml   Last 3 Weights 08/18/2021 08/17/2021 08/16/2021  Weight (lbs) 203 lb 11.3  oz 206 lb 2.1 oz 216 lb 0.8 oz  Weight (kg) 92.4 kg 93.5 kg 98 kg     Body mass index is 30.08 kg/m.  General:  Well nourished, well developed, in no acute distress, wearing nasal cannula HEENT: normal Neck: + JVD Vascular: No carotid bruits; Distal pulses 2+ bilaterally Cardiac:  normal S1, S2; RRR; no murmur  Lungs:  clear to auscultation bilaterally, no wheezing, rhonchi or rales  Abd: soft, nontender, no hepatomegaly  Ext: mild LLE edema, right BKA Musculoskeletal:  No deformities, BUE and BLE strength normal and equal Skin: warm and dry  Neuro:  CNs 2-12 intact, no focal abnormalities noted Psych:  Normal affect   EKG:  The EKG was personally reviewed and demonstrates:  SR with baseline artifact, PVCs  Relevant CV Studies:  Echo: 08/17/21  IMPRESSIONS     1. Left ventricular ejection fraction, by estimation, is 60 to 65%. The  left ventricle has normal function. The left ventricle has no regional  wall motion abnormalities. There is severe asymmetric left ventricular  hypertrophy of the basal-septal  segment. Left ventricular diastolic parameters are indeterminate.  2. Right ventricular systolic function is normal. The right ventricular  size is normal. There is mildly elevated pulmonary artery systolic  pressure. The estimated right ventricular systolic pressure is 69.4 mmHg.   3. The mitral valve is normal in structure. No evidence of mitral valve  regurgitation. No evidence of mitral stenosis.   4. The aortic valve is tricuspid. Aortic valve regurgitation is not  visualized. Aortic valve sclerosis/calcification is present, without any  evidence of aortic stenosis.   5. The inferior vena cava is normal in size with greater than 50%  respiratory variability, suggesting right atrial pressure of 3 mmHg.   FINDINGS   Left Ventricle: Left ventricular ejection fraction, by estimation, is 60  to 65%. The left ventricle has normal function. The left ventricle has no   regional wall motion abnormalities. The left ventricular internal cavity  size was normal in size. There is   severe asymmetric left ventricular hypertrophy of the basal-septal  segment. Left ventricular diastolic parameters are indeterminate.   Right Ventricle: The right ventricular size is normal. Right vetricular  wall thickness was not well visualized. Right ventricular systolic  function is normal. There is mildly elevated pulmonary artery systolic  pressure. The tricuspid regurgitant velocity   is 2.96 m/s, and with an assumed right atrial pressure of 3 mmHg, the  estimated right ventricular systolic pressure is 85.4 mmHg.   Left Atrium: Left atrial size was normal in size.   Right Atrium: Right atrial size was normal in size.   Pericardium: There is no evidence of pericardial effusion.   Mitral Valve: The mitral valve is normal in structure. No evidence of  mitral valve regurgitation. No evidence of mitral valve stenosis.   Tricuspid Valve: The tricuspid valve is normal in structure. Tricuspid  valve regurgitation is trivial.   Aortic Valve: The aortic valve is tricuspid. Aortic valve regurgitation is  not visualized. Aortic valve sclerosis/calcification is present, without  any evidence of aortic stenosis. Aortic valve peak gradient measures 9.5  mmHg.   Pulmonic Valve: The pulmonic valve was not well visualized. Pulmonic valve  regurgitation is not visualized.   Aorta: The aortic root and ascending aorta are structurally normal, with  no evidence of dilitation.   Venous: The inferior vena cava is normal in size with greater than 50%  respiratory variability, suggesting right atrial pressure of 3 mmHg.   IAS/Shunts: The interatrial septum was not well visualized.   Laboratory Data:  High Sensitivity Troponin:   Recent Labs  Lab 08/16/21 1210 08/16/21 1402  TROPONINIHS 13 12     Chemistry Recent Labs  Lab 08/16/21 1210 08/17/21 0024 08/18/21 0331  NA  142 139 136  K 3.6 3.7 4.2  CL 107 102 100  CO2 27 26 29   GLUCOSE 101* 93 135*  BUN 15 13 16   CREATININE 0.75 0.81 0.82  CALCIUM 9.4 9.3 9.1  MG  --  1.7 2.5*  GFRNONAA >60 >60 >60  ANIONGAP 8 11 7     No results for input(s): PROT, ALBUMIN, AST, ALT, ALKPHOS, BILITOT in the last 168 hours. Lipids No results for input(s): CHOL, TRIG, HDL, LABVLDL, LDLCALC, CHOLHDL in the last 168 hours.  Hematology Recent Labs  Lab 08/16/21 1210 08/17/21 0024  WBC 10.3 13.8*  RBC 3.83* 4.00  HGB 8.0* 8.6*  HCT 27.9* 29.2*  MCV 72.8* 73.0*  MCH 20.9* 21.5*  MCHC 28.7* 29.5*  RDW 17.1* 16.9*  PLT 290 303   Thyroid No results for input(s): TSH, FREET4  in the last 168 hours.  BNP Recent Labs  Lab 08/16/21 1210  BNP 190.1*    DDimer No results for input(s): DDIMER in the last 168 hours.   Radiology/Studies:  DG Chest 2 View  Result Date: 08/16/2021 CLINICAL DATA:  Shortness of breath EXAM: CHEST - 2 VIEW COMPARISON:  03/04/2021 FINDINGS: The heart size and mediastinal contours are within normal limits. Mild, diffuse bilateral interstitial pulmonary opacity. Disc degenerative disease of the thoracic spine IMPRESSION: Mild, diffuse bilateral interstitial pulmonary opacity, likely edema. No focal airspace opacity. Electronically Signed   By: Delanna Ahmadi M.D.   On: 08/16/2021 12:40   ECHOCARDIOGRAM COMPLETE  Result Date: 08/17/2021    ECHOCARDIOGRAM REPORT   Patient Name:   Debbie [name] [facility] Date of Exam: 08/17/2021 Medical Rec #:  [id number]         Height:       69.0 in Accession #:    [id number]        Weight:       206.1 lb Date of Birth:  1946-03-05         BSA:          2.093 m Patient Age:    6 years          BP:           165/76 mmHg Patient Gender: F                 HR:           84 bpm. Exam Location:  Inpatient Procedure: 2D Echo, Color Doppler, Cardiac Doppler and Intracardiac            Opacification Agent Indications:    CHF  History:        Patient has prior history of  Echocardiogram examinations. CHF,                 Pulmonary HTN, Arrythmias:Atrial Fibrillation; Risk                 Factors:Diabetes and Sleep Apnea.  Sonographer:    Jyl Heinz Referring Phys: [id number] [place]  1. Left ventricular ejection fraction, by estimation, is 60 to 65%. The left ventricle has normal function. The left ventricle has no regional wall motion abnormalities. There is severe asymmetric left ventricular hypertrophy of the basal-septal segment. Left ventricular diastolic parameters are indeterminate.  2. Right ventricular systolic function is normal. The right ventricular size is normal. There is mildly elevated pulmonary artery systolic pressure. The estimated right ventricular systolic pressure is 30.8 mmHg.  3. The mitral valve is normal in structure. No evidence of mitral valve regurgitation. No evidence of mitral stenosis.  4. The aortic valve is tricuspid. Aortic valve regurgitation is not visualized. Aortic valve sclerosis/calcification is present, without any evidence of aortic stenosis.  5. The inferior vena cava is normal in size with greater than 50% respiratory variability, suggesting right atrial pressure of 3 mmHg. FINDINGS  Left Ventricle: Left ventricular ejection fraction, by estimation, is 60 to 65%. The left ventricle has normal function. The left ventricle has no regional wall motion abnormalities. The left ventricular internal cavity size was normal in size. There is  severe asymmetric left ventricular hypertrophy of the basal-septal segment. Left ventricular diastolic parameters are indeterminate. Right Ventricle: The right ventricular size is normal. Right vetricular wall thickness was not well visualized. Right ventricular systolic function is normal. There is mildly elevated pulmonary artery systolic pressure. The tricuspid regurgitant velocity  is 2.96 m/s, and with an assumed right atrial pressure of 3 mmHg, the estimated right ventricular  systolic pressure is 67.1 mmHg. Left Atrium: Left atrial size was normal in size. Right Atrium: Right atrial size was normal in size. Pericardium: There is no evidence of pericardial effusion. Mitral Valve: The mitral valve is normal in structure. No evidence of mitral valve regurgitation. No evidence of mitral valve stenosis. Tricuspid Valve: The tricuspid valve is normal in structure. Tricuspid valve regurgitation is trivial. Aortic Valve: The aortic valve is tricuspid. Aortic valve regurgitation is not visualized. Aortic valve sclerosis/calcification is present, without any evidence of aortic stenosis. Aortic valve peak gradient measures 9.5 mmHg. Pulmonic Valve: The pulmonic valve was not well visualized. Pulmonic valve regurgitation is not visualized. Aorta: The aortic root and ascending aorta are structurally normal, with no evidence of dilitation. Venous: The inferior vena cava is normal in size with greater than 50% respiratory variability, suggesting right atrial pressure of 3 mmHg. IAS/Shunts: The interatrial septum was not well visualized.  LEFT VENTRICLE PLAX 2D LVIDd:         4.40 cm      Diastology LVIDs:         2.50 cm      LV e' medial:    4.57 cm/s LV PW:         1.20 cm      LV E/e' medial:  10.8 LV IVS:        1.20 cm      LV e' lateral:   8.27 cm/s LVOT diam:     2.00 cm      LV E/e' lateral: 6.0 LV SV:         58 LV SV Index:   28 LVOT Area:     3.14 cm  LV Volumes (MOD) LV vol d, MOD A2C: 98.5 ml LV vol d, MOD A4C: 108.0 ml LV vol s, MOD A2C: 39.2 ml LV vol s, MOD A4C: 40.8 ml LV SV MOD A2C:     59.3 ml LV SV MOD A4C:     108.0 ml LV SV MOD BP:      66.9 ml RIGHT VENTRICLE             IVC RV Basal diam:  3.20 cm     IVC diam: 1.70 cm RV Mid diam:    2.10 cm RV S prime:     10.30 cm/s TAPSE (M-mode): 1.8 cm LEFT ATRIUM             Index        RIGHT ATRIUM           Index LA diam:        3.50 cm 1.67 cm/m   RA Area:     16.30 cm LA Vol (A2C):   27.8 ml 13.28 ml/m  RA Volume:   35.00 ml   16.72 ml/m LA Vol (A4C):   42.9 ml 20.50 ml/m LA Biplane Vol: 35.1 ml 16.77 ml/m  AORTIC VALVE AV Area (Vmax): 2.16 cm AV Vmax:        154.00 cm/s AV Peak Grad:   9.5 mmHg LVOT Vmax:      106.00 cm/s LVOT Vmean:     74.800 cm/s LVOT VTI:       0.185 m  AORTA Ao Root diam: 2.70 cm Ao Asc diam:  3.30 cm MITRAL VALVE               TRICUSPID VALVE MV  Area (PHT): 3.06 cm    TR Peak grad:   35.0 mmHg MV Decel Time: 248 msec    TR Vmax:        296.00 cm/s MV E velocity: 49.40 cm/s MV A velocity: 61.00 cm/s  SHUNTS MV E/A ratio:  0.81        Systemic VTI:  0.18 m                            Systemic Diam: 2.00 cm Oswaldo Milian MD Electronically signed by Oswaldo Milian MD Signature Date/Time: 08/17/2021/4:15:33 PM    Final      Assessment and Plan:   Debbie [name] is a 75 y.o. female with a hx of  paroxysmal Afib, HTN, HLD, GERD, DM, PAH, HFpEF, s/p R BKA and COPD on chronic O2 who is being seen 08/18/2021 for the evaluation of CHF/PAH at the request of Dr. Eliseo [name].  HFpEF: Echo 12/2 showed LVEF of 60 to 65%.  BNP 190 on admission, Pulmonary edema on chest x-ray.  She has been diuresed with IV Lasix, 40 mg twice daily.  -3.5 L, weight is down 3 pounds overnight.  She was only using as needed Lasix prior to admission, suspect will need daily maintenance dose at discharge. --Would continue IV Lasix for now, follow response --I's and O's, daily weights  Paroxysmal atrial fibrillation: Sinus rhythm on admission --Continue Eliquis 5 mg twice daily, tikosyn 250 mg twice daily --K>4, mag>2  COPD on Chronic O2: Chronically on 3 L, followed by Dr. Melvyn Novas as an outpatient. --Had to increase to 4 L PTA, remains on 4 L, would attempt to wean to home dose as able  PAH: Echo 02/2021 noted severely elevated pulmonary artery pressure, though echo this admission reports as mild -- on CCB therapy  HTN: Blood pressures remain elevated --Cardizem 240 mg daily, losartan 100 mg daily --Reports her blood  pressures are better controlled at home, could consider hydralazine in the future if needed   Diabetes:  --SSI --Januvia PTA  Chronic anemia: Hemoglobin 8>> 8.6.  Reports history of hematuria, denies gross hematuria at this time.  Denies any frank bleeding or dark tarry stools --Follows with urology as an outpatient --Follow CBC with need for anticoagulation  S/p R BKA   Risk Assessment/Risk Scores:    New York Heart Association (NYHA) Functional Class NYHA Class II  CHA2DS2-VASc Score = 7  This indicates a 11.2% annual risk of stroke. The patient's score is based upon: CHF History: 1 HTN History: 1 Diabetes History: 1 Stroke History: 0 Vascular Disease History: 1 Age Score: 2 Gender Score: 1     For questions or updates, please contact [place] Please consult [email or web address] for contact info under    Signed, Reino Bellis, NP  08/18/2021 9:08 AM  Patient seen and examined and agree with Reino Bellis, NP as detailed above.  In brief, the patient is a 75 year old female with history of paroxysmal Afib, HTN, HLD, GERD, DM, PAH, HFpEF, s/p R BKA and COPD on chronic O2 who presented DOE and [name] edema found to have acute on chronic HF exacerbation for which Cardiology has been consulted.  Patient presented with progressive dyspnea, [name] edema and weight gain. BNP 190, trop flat. TTE with LVEF of 60 to 65% with no regional wall motion abnormality, severe LVH, mildly elevated pulmonary artery pressure, normal RV, no pericardial effusion or significant valvular disease. Has been started on lasix  40mg  IV BID with good response. Wt down 3lbs overnight.  Suspect symptoms are due to acute on chronic diastolic HF with volume overload rather than progression of PAH. Will continue diuresis.  GEN: No acute distress.   Neck: No JVD Cardiac: RRR, no murmurs, rubs, or gallops.  Respiratory: Clear to auscultation bilaterally. GI: Soft, nontender, non-distended  MS: No edema; No  deformity. Neuro:  Nonfocal  Psych: Normal affect    Plan: -Continue lasix 40mg  IV BID as responding well -Continue tikosyn 250mg  BID and apixaban 5mg  BID -Keep K>4, Mg>2 especially while on tikosyn -Wean O2 back to home 3L as tolerated -Monitor I/Os and daily weights  Gwyndolyn Kaufman, MD

## 2021-08-19 DIAGNOSIS — G4733 Obstructive sleep apnea (adult) (pediatric): Secondary | ICD-10-CM | POA: Diagnosis not present

## 2021-08-19 DIAGNOSIS — I5033 Acute on chronic diastolic (congestive) heart failure: Secondary | ICD-10-CM | POA: Diagnosis not present

## 2021-08-19 DIAGNOSIS — E782 Mixed hyperlipidemia: Secondary | ICD-10-CM | POA: Diagnosis not present

## 2021-08-19 DIAGNOSIS — E1169 Type 2 diabetes mellitus with other specified complication: Secondary | ICD-10-CM | POA: Diagnosis not present

## 2021-08-19 DIAGNOSIS — I5043 Acute on chronic combined systolic (congestive) and diastolic (congestive) heart failure: Secondary | ICD-10-CM

## 2021-08-19 LAB — GLUCOSE, CAPILLARY
Glucose-Capillary: 170 mg/dL — ABNORMAL HIGH (ref 70–99)
Glucose-Capillary: 160 mg/dL — ABNORMAL HIGH (ref 70–99)

## 2021-08-19 LAB — BASIC METABOLIC PANEL
Anion gap: 10 (ref 5–15)
BUN: 26 mg/dL — ABNORMAL HIGH (ref 8–23)
CO2: 27 mmol/L (ref 22–32)
Calcium: 9.4 mg/dL (ref 8.9–10.3)
Chloride: 97 mmol/L — ABNORMAL LOW (ref 98–111)
Creatinine, Ser: 1.04 mg/dL — ABNORMAL HIGH (ref 0.44–1.00)
GFR, Estimated: 56 mL/min — ABNORMAL LOW (ref >60)
Glucose, Bld: 144 mg/dL — ABNORMAL HIGH (ref 70–99)
Potassium: 4.6 mmol/L (ref 3.5–5.1)
Sodium: 134 mmol/L — ABNORMAL LOW (ref 135–145)

## 2021-08-19 LAB — PROCALCITONIN: Procalcitonin: <0.1 ng/mL

## 2021-08-19 MED ORDER — POTASSIUM CHLORIDE CRYS ER 20 MEQ PO TBCR: 20.0000 meq | EXTENDED_RELEASE_TABLET | ORAL | 0 refills | Status: AC

## 2021-08-19 MED ORDER — FUROSEMIDE 40 MG PO TABS: 40.0000 mg | ORAL_TABLET | ORAL | 0 refills | Status: DC

## 2021-08-19 NOTE — Progress Notes (Addendum)
Progress Note  Patient Name: Debbie [name] Date of Encounter: 08/19/2021  [facility] Cardiologist: Sherren Mocha, MD   Subjective   Feeling well this morning. Has been weaned to 3L [place] (baseline)  Inpatient Medications    Scheduled Meds:  apixaban  5 mg Oral BID   diltiazem  240 mg Oral Daily   dofetilide  250 mcg Oral BID   ezetimibe  10 mg Oral QPM   furosemide  40 mg Intravenous BID   gabapentin  100 mg Oral QHS   gabapentin  200 mg Oral Daily   insulin aspart  0-15 Units Subcutaneous TID WC   insulin glargine-yfgn  30 Units Subcutaneous Daily   losartan  100 mg Oral Daily   mouth rinse  15 mL Mouth Rinse BID   pantoprazole  40 mg Oral Daily   potassium chloride  40 mEq Oral Daily   sodium chloride flush  3 mL Intravenous Q12H   tamsulosin  0.4 mg Oral Daily   Continuous Infusions:  PRN Meds: acetaminophen **OR** acetaminophen, diclofenac Sodium, fluticasone, polyethylene glycol   Vital Signs    Vitals:   08/18/21 1205 08/18/21 1919 08/19/21 0500 08/19/21 0828  BP: 119/69 (!) 141/71 (!) 149/76 (!) 133/91  Pulse: (!) 106 92 85 88  Resp: 16 18 19    Temp: 98 F (36.7 C) 98.2 F (36.8 C) 97.7 F (36.5 C)   TempSrc: Oral Oral Oral   SpO2: 99% 97% 98% 100%  Weight:   90.7 kg   Height:        Intake/Output Summary (Last 24 hours) at 08/19/2021 0849 Last data filed at 08/19/2021 8546 Gross per 24 hour  Intake 840 ml  Output 1750 ml  Net -910 ml   Last 3 Weights 08/19/2021 08/18/2021 08/17/2021  Weight (lbs) 199 lb 15.3 oz 203 lb 11.3 oz 206 lb 2.1 oz  Weight (kg) 90.7 kg 92.4 kg 93.5 kg      Telemetry    SR 90s - Personally Reviewed  ECG    No new tracing this morning   Physical Exam   GEN: No acute distress.   Neck: No JVD Cardiac: RRR, no murmurs, rubs, or gallops.  Respiratory: Clear to auscultation bilaterally. GI: Soft, nontender, non-distended  MS: Right BKA Neuro:  Nonfocal  Psych: Normal affect   Labs    High Sensitivity  Troponin:   Recent Labs  Lab 08/16/21 1210 08/16/21 1402  TROPONINIHS 13 12     Chemistry Recent Labs  Lab 08/17/21 0024 08/18/21 0331 08/19/21 0535  NA 139 136 134*  K 3.7 4.2 4.6  CL 102 100 97*  CO2 26 29 27   GLUCOSE 93 135* 144*  BUN 13 16 26*  CREATININE 0.81 0.82 1.04*  CALCIUM 9.3 9.1 9.4  MG 1.7 2.5*  --   GFRNONAA >60 >60 56*  ANIONGAP 11 7 10     Lipids No results for input(s): CHOL, TRIG, HDL, LABVLDL, LDLCALC, CHOLHDL in the last 168 hours.  Hematology Recent Labs  Lab 08/16/21 1210 08/17/21 0024  WBC 10.3 13.8*  RBC 3.83* 4.00  HGB 8.0* 8.6*  HCT 27.9* 29.2*  MCV 72.8* 73.0*  MCH 20.9* 21.5*  MCHC 28.7* 29.5*  RDW 17.1* 16.9*  PLT 290 303   Thyroid No results for input(s): TSH, FREET4 in the last 168 hours.  BNP Recent Labs  Lab 08/16/21 1210  BNP 190.1*    DDimer No results for input(s): DDIMER in the last 168 hours.  Radiology    ECHOCARDIOGRAM COMPLETE  Result Date: 08/17/2021    ECHOCARDIOGRAM REPORT   Patient Name:   Debbie [name] [facility] Date of Exam: 08/17/2021 Medical Rec #:  [id number]         Height:       69.0 in Accession #:    [id number]        Weight:       206.1 lb Date of Birth:  [birth year]         BSA:          2.093 m Patient Age:    75 years          BP:           165/76 mmHg Patient Gender: F                 HR:           84 bpm. Exam Location:  Inpatient Procedure: 2D Echo, Color Doppler, Cardiac Doppler and Intracardiac            Opacification Agent Indications:    CHF  History:        Patient has prior history of Echocardiogram examinations. CHF,                 Pulmonary HTN, Arrythmias:Atrial Fibrillation; Risk                 Factors:Diabetes and Sleep Apnea.  Sonographer:    Jyl Heinz Referring Phys: [id number] [place]  1. Left ventricular ejection fraction, by estimation, is 60 to 65%. The left ventricle has normal function. The left ventricle has no regional wall motion abnormalities. There is severe  asymmetric left ventricular hypertrophy of the basal-septal segment. Left ventricular diastolic parameters are indeterminate.  2. Right ventricular systolic function is normal. The right ventricular size is normal. There is mildly elevated pulmonary artery systolic pressure. The estimated right ventricular systolic pressure is 70.1 mmHg.  3. The mitral valve is normal in structure. No evidence of mitral valve regurgitation. No evidence of mitral stenosis.  4. The aortic valve is tricuspid. Aortic valve regurgitation is not visualized. Aortic valve sclerosis/calcification is present, without any evidence of aortic stenosis.  5. The inferior vena cava is normal in size with greater than 50% respiratory variability, suggesting right atrial pressure of 3 mmHg. FINDINGS  Left Ventricle: Left ventricular ejection fraction, by estimation, is 60 to 65%. The left ventricle has normal function. The left ventricle has no regional wall motion abnormalities. The left ventricular internal cavity size was normal in size. There is  severe asymmetric left ventricular hypertrophy of the basal-septal segment. Left ventricular diastolic parameters are indeterminate. Right Ventricle: The right ventricular size is normal. Right vetricular wall thickness was not well visualized. Right ventricular systolic function is normal. There is mildly elevated pulmonary artery systolic pressure. The tricuspid regurgitant velocity  is 2.96 m/s, and with an assumed right atrial pressure of 3 mmHg, the estimated right ventricular systolic pressure is 77.9 mmHg. Left Atrium: Left atrial size was normal in size. Right Atrium: Right atrial size was normal in size. Pericardium: There is no evidence of pericardial effusion. Mitral Valve: The mitral valve is normal in structure. No evidence of mitral valve regurgitation. No evidence of mitral valve stenosis. Tricuspid Valve: The tricuspid valve is normal in structure. Tricuspid valve regurgitation is  trivial. Aortic Valve: The aortic valve is tricuspid. Aortic valve regurgitation is not visualized. Aortic valve sclerosis/calcification is present, without any  evidence of aortic stenosis. Aortic valve peak gradient measures 9.5 mmHg. Pulmonic Valve: The pulmonic valve was not well visualized. Pulmonic valve regurgitation is not visualized. Aorta: The aortic root and ascending aorta are structurally normal, with no evidence of dilitation. Venous: The inferior vena cava is normal in size with greater than 50% respiratory variability, suggesting right atrial pressure of 3 mmHg. IAS/Shunts: The interatrial septum was not well visualized.  LEFT VENTRICLE PLAX 2D LVIDd:         4.40 cm      Diastology LVIDs:         2.50 cm      LV e' medial:    4.57 cm/s LV PW:         1.20 cm      LV E/e' medial:  10.8 LV IVS:        1.20 cm      LV e' lateral:   8.27 cm/s LVOT diam:     2.00 cm      LV E/e' lateral: 6.0 LV SV:         58 LV SV Index:   28 LVOT Area:     3.14 cm  LV Volumes (MOD) LV vol d, MOD A2C: 98.5 ml LV vol d, MOD A4C: 108.0 ml LV vol s, MOD A2C: 39.2 ml LV vol s, MOD A4C: 40.8 ml LV SV MOD A2C:     59.3 ml LV SV MOD A4C:     108.0 ml LV SV MOD BP:      66.9 ml RIGHT VENTRICLE             IVC RV Basal diam:  3.20 cm     IVC diam: 1.70 cm RV Mid diam:    2.10 cm RV S prime:     10.30 cm/s TAPSE (M-mode): 1.8 cm LEFT ATRIUM             Index        RIGHT ATRIUM           Index LA diam:        3.50 cm 1.67 cm/m   RA Area:     16.30 cm LA Vol (A2C):   27.8 ml 13.28 ml/m  RA Volume:   35.00 ml  16.72 ml/m LA Vol (A4C):   42.9 ml 20.50 ml/m LA Biplane Vol: 35.1 ml 16.77 ml/m  AORTIC VALVE AV Area (Vmax): 2.16 cm AV Vmax:        154.00 cm/s AV Peak Grad:   9.5 mmHg LVOT Vmax:      106.00 cm/s LVOT Vmean:     74.800 cm/s LVOT VTI:       0.185 m  AORTA Ao Root diam: 2.70 cm Ao Asc diam:  3.30 cm MITRAL VALVE               TRICUSPID VALVE MV Area (PHT): 3.06 cm    TR Peak grad:   35.0 mmHg MV Decel Time: 248 msec     TR Vmax:        296.00 cm/s MV E velocity: 49.40 cm/s MV A velocity: 61.00 cm/s  SHUNTS MV E/A ratio:  0.81        Systemic VTI:  0.18 m                            Systemic Diam: 2.00 cm Oswaldo Milian MD Electronically signed by Oswaldo Milian MD Signature Date/Time:  08/17/2021/4:15:33 PM    Final     Cardiac Studies   Echo: 08/17/21   IMPRESSIONS     1. Left ventricular ejection fraction, by estimation, is 60 to 65%. The  left ventricle has normal function. The left ventricle has no regional  wall motion abnormalities. There is severe asymmetric left ventricular  hypertrophy of the basal-septal  segment. Left ventricular diastolic parameters are indeterminate.   2. Right ventricular systolic function is normal. The right ventricular  size is normal. There is mildly elevated pulmonary artery systolic  pressure. The estimated right ventricular systolic pressure is 78.2 mmHg.   3. The mitral valve is normal in structure. No evidence of mitral valve  regurgitation. No evidence of mitral stenosis.   4. The aortic valve is tricuspid. Aortic valve regurgitation is not  visualized. Aortic valve sclerosis/calcification is present, without any  evidence of aortic stenosis.   5. The inferior vena cava is normal in size with greater than 50%  respiratory variability, suggesting right atrial pressure of 3 mmHg.   FINDINGS   Left Ventricle: Left ventricular ejection fraction, by estimation, is 60  to 65%. The left ventricle has normal function. The left ventricle has no  regional wall motion abnormalities. The left ventricular internal cavity  size was normal in size. There is   severe asymmetric left ventricular hypertrophy of the basal-septal  segment. Left ventricular diastolic parameters are indeterminate.   Right Ventricle: The right ventricular size is normal. Right vetricular  wall thickness was not well visualized. Right ventricular systolic  function is normal. There is  mildly elevated pulmonary artery systolic  pressure. The tricuspid regurgitant velocity   is 2.96 m/s, and with an assumed right atrial pressure of 3 mmHg, the  estimated right ventricular systolic pressure is 95.6 mmHg.   Left Atrium: Left atrial size was normal in size.   Right Atrium: Right atrial size was normal in size.   Pericardium: There is no evidence of pericardial effusion.   Mitral Valve: The mitral valve is normal in structure. No evidence of  mitral valve regurgitation. No evidence of mitral valve stenosis.   Tricuspid Valve: The tricuspid valve is normal in structure. Tricuspid  valve regurgitation is trivial.   Aortic Valve: The aortic valve is tricuspid. Aortic valve regurgitation is  not visualized. Aortic valve sclerosis/calcification is present, without  any evidence of aortic stenosis. Aortic valve peak gradient measures 9.5  mmHg.   Pulmonic Valve: The pulmonic valve was not well visualized. Pulmonic valve  regurgitation is not visualized.   Aorta: The aortic root and ascending aorta are structurally normal, with  no evidence of dilitation.   Venous: The inferior vena cava is normal in size with greater than 50%  respiratory variability, suggesting right atrial pressure of 3 mmHg.   IAS/Shunts: The interatrial septum was not well visualized.     Patient Profile     75 y.o. female  a hx of  paroxysmal Afib, HTN, HLD, GERD, DM, PAH, HFpEF, s/p R BKA and COPD on chronic O2 who is being seen 08/18/2021 for the evaluation of CHF/PAH at the request of Dr. Eliseo Squires.  Assessment & Plan    HFpEF: Echo 12/2 showed LVEF of 60 to 65%.  BNP 190 on admission, Pulmonary edema on chest x-ray.  She has been diuresing with IV Lasix, 40 mg twice daily.  -4.4 L, weight is down additional 4 pounds overnight.  She was only using as needed Lasix prior to admission, suspect will need  daily maintenance dose at discharge. --Cr increased from 0.82>>1.04 today. Stop IV lasix, would plan  to resume on oral lasix 40mg  daily tomorrow --I's and O's, daily weights   Paroxysmal atrial fibrillation: Sinus rhythm on admission --Continue Eliquis 5 mg twice daily, tikosyn 250 mg twice daily --K>4, mag>2   COPD on Chronic O2: Chronically on 3 L, followed by Dr. Melvyn Novas as an outpatient. --now weaned to 3L baseline   PAH: Echo 02/2021 noted severely elevated pulmonary artery pressure, though echo this admission reports as mild -- on CCB therapy   HTN: Blood pressures improved this morning --Cardizem 240 mg daily, losartan 100 mg daily --Reports her blood pressures are better controlled at home, could consider hydralazine in the future if needed    Diabetes:  --SSI --Januvia PTA   Chronic anemia: Hemoglobin 8>> 8.6.  Reports history of hematuria, denies gross hematuria at this time.  Denies any frank bleeding or dark tarry stools --Follows with urology as an outpatient --Follow CBC with need for anticoagulation   S/p R BKA  For questions or updates, please contact [place] Please consult [email or web address] for contact info under        Signed, Reino Bellis, NP  08/19/2021, 8:49 AM    Patient seen and examined   I agree with findings as noted by Eugene Garnet    Patient is feeling much better today    Breathing good  She says her weight went up gradually over months       ON exam, pt comfortable in bed Neck  JVP is normal  Lungs with no rales   CTA Cardiac RRR  No S3 Ext are without edema    HFpEF   Prior to admit she was on prn 20 mg lasix   I feel that she will need lasix on a regular basis   Will ned K as well esp with Tikosyn use  I would probably recomm lasix 3x per week 40 mg  with 20 KCL Can arrange for quick follow up in clnic with labs  Urged her to weigh daily   Limit salt and fluid    Could go home later today from cardiac standpoint Dorris Carnes

## 2021-08-19 NOTE — Discharge Summary (Signed)
Physician Discharge Summary  Debbie [name] FKC:[id number] DOB: [birth year] DOA: 08/16/2021  PCP: [name], MD  Admit date: 08/16/2021 Discharge date: 08/19/2021  Admitted From: home Discharge disposition: home   Recommendations for Outpatient Follow-Up:   BMP 1 week: cardiology recommends lasix 3x per week 40 mg  with 20 KCL   Discharge Diagnosis:   Principal Problem:   CHF (congestive heart failure) ([place]) Active Problems:   Type 2 diabetes mellitus with diabetic polyneuropathy, with long-term current use of insulin (HCC)   GERD (gastroesophageal reflux disease)   OSA (obstructive sleep apnea)   Pulmonary hypertension (HCC)   Persistent atrial fibrillation (HCC)   Mixed diabetic hyperlipidemia associated with type 2 diabetes mellitus ([place])   Acute on chronic combined systolic and diastolic CHF (congestive heart failure) ([place])    Discharge Condition: Improved.  Diet recommendation: Low sodium, heart healthy.  Carbohydrate-modified  Wound care: None.  Code status: Full.   History of Present Illness:   Debbie [name] is a 75 y.o. female with medical history significant of pulmonary hypertension, CHF, A. fib, COPD, hypertension, GERD, status post right BKA, hyperlipidemia, diabetes, OSA presenting with shortness of breath for the past 3 to 4 days.  Patient states that she is noticing shortness of breath for last 3 to 4 days.  She is chronically on 3 L at home and has had to increase this to 4 L.  She has had desaturation to the 80s on ambulation.  She also is noted lower extremity edema and a nonproductive cough.  She has anxiety about her inability to maintain oxygen.  She denies fevers, chills, chest pain, shortness of breath, abdominal pain, constipation, diarrhea, nausea, vomiting.    Hospital Course by Problem:   Acute on chronic hypoxemic respiratory failure, suspect exacerbation of pulmonary arterial hypertension and diastolic dysfunction: -  Recent echocardiogram with normal ejection fraction and grade 1 diastolic dysfunction. -Aggressive electrolyte replacement with magnesium and potassium. -echocardiogram: There is severe asymmetric left ventricular hypertrophy of the basal-septal segment. Intake and output monitoring. -cardiology consulted: recomm lasix 3x per week 40 mg  with 20 KCL  Paroxysmal A. fib: Currently in sinus rhythm.  She is on diltiazem, Tikosyn and therapeutic on Eliquis.  Essential hypertension: Blood pressure stable on diltiazem and losartan.  GERD: On PPI.  Type 2 diabetes on insulin with hyperglycemia:  -Continued home doses of insulin. -SSI      Medical Consultants:   cards   Discharge Exam:   Vitals:   08/19/21 0828 08/19/21 1109  BP: (!) 133/91 (!) 144/88  Pulse: 88 100  Resp:  18  Temp:  97.9 F (36.6 C)  SpO2: 100% 98%   Vitals:   08/18/21 1919 08/19/21 0500 08/19/21 0828 08/19/21 1109  BP: (!) 141/71 (!) 149/76 (!) 133/91 (!) 144/88  Pulse: 92 85 88 100  Resp: 18 19  18   Temp: 98.2 F (36.8 C) 97.7 F (36.5 C)  97.9 F (36.6 C)  TempSrc: Oral Oral  Oral  SpO2: 97% 98% 100% 98%  Weight:  90.7 kg    Height:        General exam: Appears calm and comfortable.  The results of significant diagnostics from this hospitalization (including imaging, microbiology, ancillary and laboratory) are listed below for reference.     Procedures and Diagnostic Studies:   DG Chest 2 View  Result Date: 08/16/2021 CLINICAL DATA:  Shortness of breath EXAM: CHEST - 2 VIEW COMPARISON:  03/04/2021 FINDINGS: The  heart size and mediastinal contours are within normal limits. Mild, diffuse bilateral interstitial pulmonary opacity. Disc degenerative disease of the thoracic spine IMPRESSION: Mild, diffuse bilateral interstitial pulmonary opacity, likely edema. No focal airspace opacity. Electronically Signed   By: Delanna Ahmadi M.D.   On: 08/16/2021 12:40   ECHOCARDIOGRAM COMPLETE  Result Date:  08/17/2021    ECHOCARDIOGRAM REPORT   Patient Name:   Debbie [name] [facility] Date of Exam: 08/17/2021 Medical Rec #:  [id number]         Height:       69.0 in Accession #:    [id number]        Weight:       206.1 lb Date of Birth:  [birth year]         BSA:          2.093 m Patient Age:    20 years          BP:           165/76 mmHg Patient Gender: F                 HR:           84 bpm. Exam Location:  Inpatient Procedure: 2D Echo, Color Doppler, Cardiac Doppler and Intracardiac            Opacification Agent Indications:    CHF  History:        Patient has prior history of Echocardiogram examinations. CHF,                 Pulmonary HTN, Arrythmias:Atrial Fibrillation; Risk                 Factors:Diabetes and Sleep Apnea.  Sonographer:    Jyl Heinz Referring Phys: [id number] [place]  1. Left ventricular ejection fraction, by estimation, is 60 to 65%. The left ventricle has normal function. The left ventricle has no regional wall motion abnormalities. There is severe asymmetric left ventricular hypertrophy of the basal-septal segment. Left ventricular diastolic parameters are indeterminate.  2. Right ventricular systolic function is normal. The right ventricular size is normal. There is mildly elevated pulmonary artery systolic pressure. The estimated right ventricular systolic pressure is 27.7 mmHg.  3. The mitral valve is normal in structure. No evidence of mitral valve regurgitation. No evidence of mitral stenosis.  4. The aortic valve is tricuspid. Aortic valve regurgitation is not visualized. Aortic valve sclerosis/calcification is present, without any evidence of aortic stenosis.  5. The inferior vena cava is normal in size with greater than 50% respiratory variability, suggesting right atrial pressure of 3 mmHg. FINDINGS  Left Ventricle: Left ventricular ejection fraction, by estimation, is 60 to 65%. The left ventricle has normal function. The left ventricle has no regional wall motion  abnormalities. The left ventricular internal cavity size was normal in size. There is  severe asymmetric left ventricular hypertrophy of the basal-septal segment. Left ventricular diastolic parameters are indeterminate. Right Ventricle: The right ventricular size is normal. Right vetricular wall thickness was not well visualized. Right ventricular systolic function is normal. There is mildly elevated pulmonary artery systolic pressure. The tricuspid regurgitant velocity  is 2.96 m/s, and with an assumed right atrial pressure of 3 mmHg, the estimated right ventricular systolic pressure is 82.4 mmHg. Left Atrium: Left atrial size was normal in size. Right Atrium: Right atrial size was normal in size. Pericardium: There is no evidence of pericardial effusion. Mitral Valve: The mitral valve is normal in  structure. No evidence of mitral valve regurgitation. No evidence of mitral valve stenosis. Tricuspid Valve: The tricuspid valve is normal in structure. Tricuspid valve regurgitation is trivial. Aortic Valve: The aortic valve is tricuspid. Aortic valve regurgitation is not visualized. Aortic valve sclerosis/calcification is present, without any evidence of aortic stenosis. Aortic valve peak gradient measures 9.5 mmHg. Pulmonic Valve: The pulmonic valve was not well visualized. Pulmonic valve regurgitation is not visualized. Aorta: The aortic root and ascending aorta are structurally normal, with no evidence of dilitation. Venous: The inferior vena cava is normal in size with greater than 50% respiratory variability, suggesting right atrial pressure of 3 mmHg. IAS/Shunts: The interatrial septum was not well visualized.  LEFT VENTRICLE PLAX 2D LVIDd:         4.40 cm      Diastology LVIDs:         2.50 cm      LV e' medial:    4.57 cm/s LV PW:         1.20 cm      LV E/e' medial:  10.8 LV IVS:        1.20 cm      LV e' lateral:   8.27 cm/s LVOT diam:     2.00 cm      LV E/e' lateral: 6.0 LV SV:         58 LV SV Index:   28  LVOT Area:     3.14 cm  LV Volumes (MOD) LV vol d, MOD A2C: 98.5 ml LV vol d, MOD A4C: 108.0 ml LV vol s, MOD A2C: 39.2 ml LV vol s, MOD A4C: 40.8 ml LV SV MOD A2C:     59.3 ml LV SV MOD A4C:     108.0 ml LV SV MOD BP:      66.9 ml RIGHT VENTRICLE             IVC RV Basal diam:  3.20 cm     IVC diam: 1.70 cm RV Mid diam:    2.10 cm RV S prime:     10.30 cm/s TAPSE (M-mode): 1.8 cm LEFT ATRIUM             Index        RIGHT ATRIUM           Index LA diam:        3.50 cm 1.67 cm/m   RA Area:     16.30 cm LA Vol (A2C):   27.8 ml 13.28 ml/m  RA Volume:   35.00 ml  16.72 ml/m LA Vol (A4C):   42.9 ml 20.50 ml/m LA Biplane Vol: 35.1 ml 16.77 ml/m  AORTIC VALVE AV Area (Vmax): 2.16 cm AV Vmax:        154.00 cm/s AV Peak Grad:   9.5 mmHg LVOT Vmax:      106.00 cm/s LVOT Vmean:     74.800 cm/s LVOT VTI:       0.185 m  AORTA Ao Root diam: 2.70 cm Ao Asc diam:  3.30 cm MITRAL VALVE               TRICUSPID VALVE MV Area (PHT): 3.06 cm    TR Peak grad:   35.0 mmHg MV Decel Time: 248 msec    TR Vmax:        296.00 cm/s MV E velocity: 49.40 cm/s MV A velocity: 61.00 cm/s  SHUNTS MV E/A ratio:  0.81  Systemic VTI:  0.18 m                            Systemic Diam: 2.00 cm Oswaldo Milian MD Electronically signed by Oswaldo Milian MD Signature Date/Time: 08/17/2021/4:15:33 PM    Final      Labs:   Basic Metabolic Panel: Recent Labs  Lab 08/16/21 1210 08/17/21 0024 08/18/21 0331 08/19/21 0535  NA 142 139 136 134*  K 3.6 3.7 4.2 4.6  CL 107 102 100 97*  CO2 27 26 29 27   GLUCOSE 101* 93 135* 144*  BUN 15 13 16  26*  CREATININE 0.75 0.81 0.82 1.04*  CALCIUM 9.4 9.3 9.1 9.4  MG  --  1.7 2.5*  --   PHOS  --   --  5.0*  --    GFR Estimated Creatinine Clearance: 56.1 mL/min (A) (by C-G formula based on SCr of 1.04 mg/dL (H)). Liver Function Tests: No results for input(s): AST, ALT, ALKPHOS, BILITOT, PROT, ALBUMIN in the last 168 hours. No results for input(s): LIPASE, AMYLASE in the last 168  hours. No results for input(s): AMMONIA in the last 168 hours. Coagulation profile No results for input(s): INR, PROTIME in the last 168 hours.  CBC: Recent Labs  Lab 08/16/21 1210 08/17/21 0024  WBC 10.3 13.8*  HGB 8.0* 8.6*  HCT 27.9* 29.2*  MCV 72.8* 73.0*  PLT 290 303   Cardiac Enzymes: No results for input(s): CKTOTAL, CKMB, CKMBINDEX, TROPONINI in the last 168 hours. BNP: Invalid input(s): POCBNP CBG: Recent Labs  Lab 08/18/21 1206 08/18/21 1614 08/18/21 2101 08/19/21 0557 08/19/21 1110  GLUCAP 164* 166* 162* 170* 160*   D-Dimer No results for input(s): DDIMER in the last 72 hours. Hgb A1c No results for input(s): HGBA1C in the last 72 hours. Lipid Profile No results for input(s): CHOL, HDL, LDLCALC, TRIG, CHOLHDL, LDLDIRECT in the last 72 hours. Thyroid function studies No results for input(s): TSH, T4TOTAL, T3FREE, THYROIDAB in the last 72 hours.  Invalid input(s): FREET3 Anemia work up No results for input(s): VITAMINB12, FOLATE, FERRITIN, TIBC, IRON, RETICCTPCT in the last 72 hours. Microbiology Recent Results (from the past 240 hour(s))  Resp Panel by RT-PCR (Flu A&B, Covid) Nasopharyngeal Swab     Status: None   Collection Time: 08/16/21 12:34 PM   Specimen: Nasopharyngeal Swab; Nasopharyngeal(NP) swabs in vial transport medium  Result Value Ref Range Status   SARS Coronavirus 2 by RT PCR NEGATIVE NEGATIVE Final    Comment: (NOTE) SARS-CoV-2 target nucleic acids are NOT DETECTED.  The SARS-CoV-2 RNA is generally detectable in upper respiratory specimens during the acute phase of infection. The lowest concentration of SARS-CoV-2 viral copies this assay can detect is 138 copies/mL. A negative result does not preclude SARS-Cov-2 infection and should not be used as the sole basis for treatment or other patient management decisions. A negative result may occur with  improper specimen collection/handling, submission of specimen other than  nasopharyngeal swab, presence of viral mutation(s) within the areas targeted by this assay, and inadequate number of viral copies(<138 copies/mL). A negative result must be combined with clinical observations, patient history, and epidemiological information. The expected result is Negative.  Fact Sheet for Patients:  EntrepreneurPulse.com.au  Fact Sheet for Healthcare Providers:  IncredibleEmployment.be  This test is no t yet approved or cleared by the [place] FDA and  has been authorized for detection and/or diagnosis of SARS-CoV-2 by FDA under an Emergency Use Authorization (  EUA). This EUA will remain  in effect (meaning this test can be used) for the duration of the COVID-19 declaration under Section 564(b)(1) of the Act, 21 U.S.C.section 360bbb-3(b)(1), unless the authorization is terminated  or revoked sooner.       Influenza A by PCR NEGATIVE NEGATIVE Final   Influenza B by PCR NEGATIVE NEGATIVE Final    Comment: (NOTE) The Xpert Xpress SARS-CoV-2/FLU/RSV plus assay is intended as an aid in the diagnosis of influenza from Nasopharyngeal swab specimens and should not be used as a sole basis for treatment. Nasal washings and aspirates are unacceptable for Xpert Xpress SARS-CoV-2/FLU/RSV testing.  Fact Sheet for Patients: EntrepreneurPulse.com.au  Fact Sheet for Healthcare Providers: IncredibleEmployment.be  This test is not yet approved or cleared by the [place] FDA and has been authorized for detection and/or diagnosis of SARS-CoV-2 by FDA under an Emergency Use Authorization (EUA). This EUA will remain in effect (meaning this test can be used) for the duration of the COVID-19 declaration under Section 564(b)(1) of the Act, 21 U.S.C. section 360bbb-3(b)(1), unless the authorization is terminated or revoked.  Performed at [facility], [address] [place], [place] [address]      Discharge Instructions:   Discharge Instructions     ([place]) Call MD:  Anytime you have any of the following symptoms: 1) 3 pound weight gain in 24 hours or 5 pounds in 1 week 2) shortness of breath, with or without a dry hacking cough 3) swelling in the hands, feet or stomach 4) if you have to sleep on extra pillows at night in order to breathe.   Complete by: As directed    Diet - low sodium heart healthy   Complete by: As directed    Diet Carb Modified   Complete by: As directed    Heart Failure patients record your daily weight using the same scale at the same time of day   Complete by: As directed    Increase activity slowly   Complete by: As directed       Allergies as of 08/19/2021       Reactions   Meloxicam Other (See Comments)   Causes excess Fluid buildup        Medication List     STOP taking these medications    HYDROcodone-acetaminophen 5-325 MG tablet Commonly known as: NORCO/VICODIN   methocarbamol 500 MG tablet Commonly known as: ROBAXIN       TAKE these medications    acetaminophen 500 MG tablet Commonly known as: TYLENOL Take 500 mg by mouth every 6 (six) hours as needed for mild pain (or headaches).   apixaban 5 MG Tabs tablet Commonly known as: ELIQUIS Take 1 tablet (5 mg total) by mouth 2 (two) times daily.   Basaglar KwikPen 100 UNIT/ML Inject 45 Units into the skin daily before breakfast.   BD Pen Needle Nano U/F 32G X 4 MM Misc Generic drug: Insulin Pen Needle 2 (two) times daily. as directed   Insulin Pen Needle 32G X 4 MM Misc USE TWICE DAILY AS DIRECTED   CALCIUM 1000 + D PO Take 1,000 mg by mouth daily.   Coenzyme Q10 400 MG Caps Take 400 mg by mouth daily.   dexlansoprazole 60 MG capsule Commonly known as: Dexilant Take 1 capsule (60 mg total) by mouth daily.   diltiazem 240 MG 24 hr capsule Commonly known as: CARDIZEM CD Take 1 capsule (240 mg total) by mouth  daily.  dofetilide 250 MCG capsule Commonly known as: TIKOSYN TAKE 1 CAPSULE BY MOUTH TWICE A DAY   estradiol 0.1 MG/GM vaginal cream Commonly known as: ESTRACE Place vaginally.   ezetimibe 10 MG tablet Commonly known as: ZETIA Take 10 mg by mouth every evening.   fluticasone 50 MCG/ACT nasal spray Commonly known as: FLONASE Place 1 spray into both nostrils daily as needed for allergies.   furosemide 40 MG tablet Commonly known as: Lasix Take 1 tablet (40 mg total) by mouth 3 (three) times a week. Start taking on: August 20, 2021 What changed:  how much to take when to take this reasons to take this   gabapentin 100 MG capsule Commonly known as: NEURONTIN Take 100-200 mg by mouth See admin instructions. Take 200 mg in the morning and 100 mg at night   halobetasol 0.05 % cream Commonly known as: ULTRAVATE Apply 1 application topically 2 (two) times daily as needed (psoriasis).   ibandronate 150 MG tablet Commonly known as: BONIVA Take 150 mg by mouth every 30 (thirty) days.   Januvia 100 MG tablet Generic drug: sitaGLIPtin Take 100 mg by mouth daily.   ketotifen 0.025 % ophthalmic solution Commonly known as: ZADITOR Place 1 drop into both eyes daily as needed (for irritation).   losartan 100 MG tablet Commonly known as: COZAAR TAKE 1 TABLET BY MOUTH EVERY DAY   magnesium oxide 400 (241.3 Mg) MG tablet Commonly known as: MAG-OX Take 1 tablet (400 mg total) by mouth 2 (two) times daily. Please make yearly appt with Dr. [name] for April 2022 for future refills. Thank you 1st attempt What changed: additional instructions   methenamine 1 g tablet Commonly known as: HIPREX Take 1 g by mouth 2 (two) times daily.   OXYGEN Inhale 3 L/min into the lungs continuous.   potassium chloride SA 20 MEQ tablet Commonly known as: KLOR-CON M Take 1 tablet (20 mEq total) by mouth 3 (three) times a week. Start taking on: August 20, 2021   PREPARATION H EX Place 1  application rectally 2 (two) times daily as needed (for pain).   tamsulosin 0.4 MG Caps capsule Commonly known as: FLOMAX Take 0.4 mg by mouth daily.   tiZANidine 4 MG tablet Commonly known as: ZANAFLEX Take 4 mg by mouth at bedtime.   Voltaren 1 % Gel Generic drug: diclofenac Sodium Apply 2 g topically 4 (four) times daily as needed (for arthritic pain).        Follow-up Information     [name], MD Follow up in 1 week(s).   Specialty: Family Medicine Why: bmp 1 week Contact information: [place] [address] [phone]         Evans Lance, MD .   Specialty: Cardiology Contact information: [phone] [address] [place] [address] [phone]                  Time coordinating discharge: 35 min  Signed:  Geradine Girt DO  [facility] 08/19/2021, 1:19 PM

## 2021-08-19 NOTE — Progress Notes (Signed)
Discharge information was provided to patient and her daughter, including her medications, follow up appointments etc. Pt and her family verbalized of understanding.

## 2021-08-19 NOTE — Progress Notes (Signed)
Mobility Specialist: Progress Note   08/19/21 1404  Mobility  Activity Ambulated in hall  Level of Assistance Contact guard assist, steadying assist  Assistive Device Cane  Distance Ambulated (ft) 24 ft  Mobility Ambulated with assistance in hallway  Mobility Response Tolerated fair  Mobility performed by Mobility specialist  $Mobility charge 1 Mobility   Pt independent to sit EOB and mod I to stand using four point cane. Distance limited d/t pt feeling a little light headed and back pain, no rating given. Pt sitting EOB after walk with family present in the room.   [facility] Chyane Greer Mobility Specialist Mobility Specialist Phone #1: [phone] Mobility Specialist Phone #2: [phone]

## 2021-09-03 NOTE — Progress Notes (Signed)
PCP:  [name], MD Primary Cardiologist: Sherren Mocha, MD Electrophysiologist: Cristopher Peru, MD   Debbie [name] is a 75 y.o. female seen today for Cristopher Peru, MD for post hospital follow up.    Admitted 12/1 -12/4 with CHF and acute hypoxemic respiratory failure. EF normal, maintained NSR during admission on tikosyn.   Since discharge from hospital the patient reports doing OK. She continues to struggle with at least mild volume overload. She has been taking lasix 3 times DAILY. Remains on chronic O2, stabilized on 3 Lpm. she denies chest pain, palpitations, dyspnea, PND, orthopnea, nausea, vomiting, dizziness, syncope, weight gain, or early satiety.  Past Medical History:  Diagnosis Date   A-fib ([place]) 11/15/2019   Abnormal liver function    Adenomatous polyp 12/04/2006   AKI (acute kidney injury) ([place])    Asthma    DM type 2 (diabetes mellitus, type 2) ([place])    GERD (gastroesophageal reflux disease) 02/28/2012   Hemorrhoid 12/04/2006   Hyperlipidemia    Hypertension    Hypomagnesemia    Hypotension 09/21/2019   Peripheral arterial disease ([place])    Persistent atrial fibrillation ([place]) 11/15/2019   Pulmonary hypertension ([place]) 03/28/2017   Echo 04/04/17 Compared to a prior study in 2015,   there is now moderate LVH and the LVEF is higher at 65-70%. No   obvious PFO noted by saline microbubble contrast. There is   moderate TR with an RVSP of 56 mmHg and a normal, collapsing IVC.   Consistent with moderate pulmonary hypertension.   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  Delight Ovens, adenomatous polyp   COLONOSCOPY  03/25/2012   Procedure: COLONOSCOPY;  Surgeon: Daneil Dolin, MD;  [facility];  Service: Endoscopy;  Laterality: N/A;  10:30   ESOPHAGOGASTRODUODENOSCOPY  11/03/2002   Dr. Gala Romney- normal exam- was done to check for possible foreign body   Fiberoptic bronchoscopy with endobronchial  ultrasound  10/22/2010   [place]   Right BKA  1990   RIGHT HEART CATHETERIZATION N/A 06/22/2014   Procedure: RIGHT HEART CATH;  Surgeon: Larey Dresser, MD;  Location: [facility] CATH LAB;  Service: Cardiovascular;  Laterality: N/A;    Current Outpatient Medications  Medication Sig Dispense Refill   acetaminophen (TYLENOL) 500 MG tablet Take 500 mg by mouth every 6 (six) hours as needed for mild pain (or headaches).     apixaban (ELIQUIS) 5 MG TABS tablet Take 1 tablet (5 mg total) by mouth 2 (two) times daily. 60 tablet 0   BD PEN NEEDLE NANO U/F 32G X 4 MM MISC 2 (two) times daily. as directed  6   Calcium Carb-Cholecalciferol (CALCIUM 1000 + D PO) Take 1,000 mg by mouth daily.     Coenzyme Q10 400 MG CAPS Take 400 mg by mouth daily.      dexlansoprazole (DEXILANT) 60 MG capsule Take 1 capsule (60 mg total) by mouth daily.     diltiazem (CARDIZEM CD) 240 MG 24 hr capsule Take 1 capsule (240 mg total) by  mouth daily. 30 capsule 0   dofetilide (TIKOSYN) 250 MCG capsule TAKE 1 CAPSULE BY MOUTH TWICE A DAY 180 capsule 3   estradiol (ESTRACE) 0.1 MG/GM vaginal cream Place vaginally.     ezetimibe (ZETIA) 10 MG tablet Take 10 mg by mouth every evening.      fluticasone (FLONASE) 50 MCG/ACT nasal spray Place 1 spray into both nostrils daily as needed for allergies.     furosemide (LASIX) 40 MG tablet Take 1 tablet (40 mg total) by mouth 3 (three) times a week. (Patient taking differently: Take 40 mg by mouth 3 (three) times daily.) 30 tablet 0   gabapentin (NEURONTIN) 100 MG capsule Take 100-200 mg by mouth See admin instructions. Take 200 mg in the morning and 100  mg at night     halobetasol (ULTRAVATE) 0.05 % cream Apply 1 application topically 2 (two) times daily as needed (psoriasis).      ibandronate (BONIVA) 150 MG tablet Take 150 mg by mouth every 30 (thirty) days.     Insulin Glargine (BASAGLAR KWIKPEN) 100 UNIT/ML SOPN Inject 45 Units into the skin daily before breakfast.     Insulin Pen Needle 32G X 4 MM MISC USE TWICE DAILY AS DIRECTED     JANUVIA 100 MG tablet Take 100 mg by mouth daily.     ketotifen (ZADITOR) 0.025 % ophthalmic solution Place 1 drop into both eyes daily as needed (for irritation).     Lidocaine-Glycerin (PREPARATION H EX) Place 1 application rectally 2 (two) times daily as needed (for pain).     losartan (COZAAR) 100 MG tablet TAKE 1 TABLET BY MOUTH EVERY DAY 90 tablet 3   magnesium oxide (MAG-OX) 400 (241.3 Mg) MG tablet Take 1 tablet (400 mg total) by mouth 2 (two) times daily. Please make yearly appt with Dr. [name] for April 2022 for future refills. Thank you 1st attempt (Patient taking differently: Take 400 mg by mouth 2 (two) times daily.) 60 tablet 2   methenamine (HIPREX) 1 g tablet Take 1 g by mouth 2 (two) times daily.     OXYGEN Inhale 3 L/min into the lungs continuous.     potassium chloride SA (KLOR-CON M) 20 MEQ tablet Take 1 tablet (20 mEq total) by mouth 3 (three) times a week. 30 tablet 0   tamsulosin (FLOMAX) 0.4 MG CAPS capsule Take 0.4 mg by mouth daily.     tiZANidine (ZANAFLEX) 4 MG tablet Take 4 mg by mouth at bedtime.     VOLTAREN 1 % GEL Apply 2 g topically 4 (four) times daily as needed (for arthritic pain).  2   No current facility-administered medications for this visit.    Allergies  Allergen Reactions   Meloxicam Other (See Comments)    Causes excess Fluid buildup    Social History   Socioeconomic History   Marital status: Married    Spouse name: Engineer, water   Number of children: 3   Years of education: Not on file   Highest education level: Not on file  Occupational History    Occupation: retired    Fish farm manager: UNEMPLOYED  Tobacco Use   Smoking status: Former    Packs/day: 0.50    Years: 18.00    Pack years: 9.00    Types: Cigarettes    Quit date: 09/16/1990    Years since quitting: 30.9   Smokeless tobacco: Never  Vaping Use   Vaping Use: Never used  Substance and Sexual Activity   Alcohol use: No  Alcohol/week: 0.0 standard drinks   Drug use: No   Sexual activity: Not on file  Other Topics Concern   Not on file  Social History Narrative   Not on file   Social Determinants of Health   Financial Resource Strain: Not on file  Food Insecurity: Not on file  Transportation Needs: Not on file  Physical Activity: Not on file  Stress: Not on file  Social Connections: Not on file  Intimate Partner Violence: Not on file     Review of Systems: All other systems reviewed and are otherwise negative except as noted above.  Physical Exam: Vitals:   09/04/21 0844  BP: 126/70  Pulse: 97  SpO2: 97%  Weight: 208 lb (94.3 kg)  Height: 5\' 8"  (1.727 m)    GEN- The patient is well appearing, alert and oriented x 3 today.   HEENT: normocephalic, atraumatic; sclera clear, conjunctiva pink; hearing intact; oropharynx clear; neck supple, no JVP Lymph- no cervical lymphadenopathy Lungs- Clear to ausculation bilaterally, normal work of breathing.  No wheezes, rales, rhonchi Heart- Regular rate and rhythm, no murmurs, rubs or gallops, PMI not laterally displaced GI- soft, non-tender, non-distended, bowel sounds present, no hepatosplenomegaly Extremities- no clubbing, cyanosis, or edema; DP/PT/radial pulses 2+ bilaterally MS- no significant deformity or atrophy Skin- warm and dry, no rash or lesion Psych- euthymic mood, full affect Neuro- strength and sensation are intact  EKG is ordered. Personal review of EKG from today shows NSR at 85 bpm with stable QTc on tikosyn.  Additional studies reviewed include: Previous EP admission notes.   Assessment and  Plan:  1. Paroxysmal atrial fibrillation Echo 08/17/2021 LVEF 60-65%. EKG today shows stable QTc on tikosyn 250 mcg BID  2. HTN Stable on current regimen   3. COPD On home 02  4. Chronic diastolic CHF EF 00-76% She has been taking lasix 40 mg TID since discharge.  Labs today Change lasix to 80 mg daily for easier management, discussed sliding scale diuretics. Hold lasix for weight < 198 lbs. Take extra 40 mg for weight > 206-207.   Follow up with EP APP in 4 weeks. Sooner if needed pending labwork.    Shirley Friar, PA-C  09/04/21 8:56 AM

## 2021-09-04 ENCOUNTER — Encounter: Payer: Self-pay | Admitting: Student

## 2021-09-04 ENCOUNTER — Other Ambulatory Visit: Payer: Self-pay

## 2021-09-04 ENCOUNTER — Ambulatory Visit (INDEPENDENT_AMBULATORY_CARE_PROVIDER_SITE_OTHER): Payer: Medicare Other | Admitting: Student

## 2021-09-04 VITALS — BP 126/70 | HR 97 | Ht 68.0 in | Wt 208.0 lb

## 2021-09-04 DIAGNOSIS — I4891 Unspecified atrial fibrillation: Secondary | ICD-10-CM

## 2021-09-04 DIAGNOSIS — I10 Essential (primary) hypertension: Secondary | ICD-10-CM | POA: Diagnosis not present

## 2021-09-04 DIAGNOSIS — J449 Chronic obstructive pulmonary disease, unspecified: Secondary | ICD-10-CM

## 2021-09-04 MED ORDER — FUROSEMIDE 40 MG PO TABS
80.0000 mg | ORAL_TABLET | Freq: Every day | ORAL | 3 refills | Status: AC
Start: 2021-09-04 — End: 2023-02-18

## 2021-09-04 NOTE — Patient Instructions (Signed)
Medication Instructions:  Your physician recommends that you continue on your current medications as directed. Please refer to the Current Medication list given to you today.  CHANGE: Furosemide 80mg  daily  *If you need a refill on your cardiac medications before your next appointment, please call your pharmacy*   Lab Work: TODAY: BMET, Mag  If you have labs (blood work) drawn today and your tests are completely normal, you will receive your results only by: [place] (if you have MyChart) OR A paper copy in the mail If you have any lab test that is abnormal or we need to change your treatment, we will call you to review the results.   Follow-Up: At [facility], you and your health needs are our priority.  As part of our continuing mission to provide you with exceptional heart care, we have created designated Provider Care Teams.  These Care Teams include your primary Cardiologist (physician) and Advanced Practice Providers (APPs -  Physician Assistants and Nurse Practitioners) who all work together to provide you with the care you need, when you need it.   Your next appointment:   As scheduled

## 2021-09-05 LAB — BASIC METABOLIC PANEL
BUN/Creatinine Ratio: 24 (ref 12–28)
BUN: 19 mg/dL (ref 8–27)
CO2: 27 mmol/L (ref 20–29)
Calcium: 9.6 mg/dL (ref 8.7–10.3)
Chloride: 96 mmol/L (ref 96–106)
Creatinine, Ser: 0.8 mg/dL (ref 0.57–1.00)
Glucose: 145 mg/dL — ABNORMAL HIGH (ref 70–99)
Potassium: 4.5 mmol/L (ref 3.5–5.2)
Sodium: 138 mmol/L (ref 134–144)
eGFR: 77 mL/min/{1.73_m2} (ref >59)

## 2021-09-05 LAB — MAGNESIUM: Magnesium: 1.9 mg/dL (ref 1.6–2.3)

## 2021-09-21 ENCOUNTER — Encounter: Payer: Self-pay | Admitting: Internal Medicine

## 2021-10-01 NOTE — Progress Notes (Signed)
PCP:  [name], MD Primary Cardiologist: Sherren Mocha, MD Electrophysiologist: Cristopher Peru, MD   Debbie [name] is a 76 y.o. female seen today for Cristopher Peru, MD for routine electrophysiology followup.  Since last being seen in our clinic the patient reports doing poorly. She is having recurrent UTIs/hematuria and plan for cystoscopy with possible biopsy next week. Breathing and edema have gotten "much better" on lasix. Currently, denies chest pain, palpitations, dyspnea worse than baseline, PND, orthopnea, nausea, vomiting, dizziness, syncope, weight gain, or early satiety.  Past Medical History:  Diagnosis Date   A-fib ([place]) 11/15/2019   Abnormal liver function    Adenomatous polyp 12/04/2006   AKI (acute kidney injury) ([place])    Asthma    DM type 2 (diabetes mellitus, type 2) ([place])    GERD (gastroesophageal reflux disease) 02/28/2012   Hemorrhoid 12/04/2006   Hyperlipidemia    Hypertension    Hypomagnesemia    Hypotension 09/21/2019   Peripheral arterial disease ([place])    Persistent atrial fibrillation ([place]) 11/15/2019   Pulmonary hypertension ([place]) 03/28/2017   Echo 04/04/17 Compared to a prior study in 2015,   there is now moderate LVH and the LVEF is higher at 65-70%. No   obvious PFO noted by saline microbubble contrast. There is   moderate TR with an RVSP of 56 mmHg and a normal, collapsing IVC.   Consistent with moderate pulmonary hypertension.   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 Delight Ovens, adenomatous polyp   COLONOSCOPY  03/25/2012   Procedure:  COLONOSCOPY;  Surgeon: Daneil Dolin, MD;  [facility] ENDO [facility];  Service: Endoscopy;  Laterality: N/A;  10:30   ESOPHAGOGASTRODUODENOSCOPY  11/03/2002   Dr. Gala Romney- normal exam- was done to check for possible foreign body   Fiberoptic bronchoscopy with endobronchial  ultrasound  10/22/2010   [place]   Right BKA  1990   RIGHT HEART CATHETERIZATION N/A 06/22/2014   Procedure: RIGHT HEART CATH;  Surgeon: Larey Dresser, MD;  Location: [facility] CATH LAB;  Service: Cardiovascular;  Laterality: N/A;    Current Outpatient Medications  Medication Sig Dispense Refill   acetaminophen (TYLENOL) 500 MG tablet Take 500 mg by mouth every 6 (six) hours as needed for mild pain (or headaches).     apixaban (ELIQUIS) 5 MG TABS tablet Take 1 tablet (5 mg total) by mouth 2 (two) times daily. 60 tablet 0   BD PEN NEEDLE NANO U/F 32G X 4 MM MISC 2 (two) times daily. as directed  6   Calcium Carb-Cholecalciferol (CALCIUM 1000 + D PO) Take 1,000 mg by mouth daily.     cetirizine (ZYRTEC) 10 MG tablet Take by mouth as needed.     Coenzyme Q10 400 MG CAPS Take 400 mg by mouth daily.      dexlansoprazole (DEXILANT) 60 MG capsule Take 1 capsule (60 mg total) by mouth daily.     diltiazem (CARDIZEM CD) 240 MG 24 hr capsule Take 1 capsule (240 mg total) by mouth daily. 30 capsule 0  dofetilide (TIKOSYN) 250 MCG capsule TAKE 1 CAPSULE BY MOUTH TWICE A DAY 180 capsule 3   estradiol (ESTRACE) 0.1 MG/GM vaginal cream Place vaginally.     ezetimibe (ZETIA) 10 MG tablet Take 10 mg by mouth every evening.      fluticasone (FLONASE) 50 MCG/ACT nasal spray Place 1 spray into both nostrils daily as needed for allergies.     furosemide (LASIX) 40 MG tablet Take 2 tablets (80 mg total) by mouth daily. 180 tablet 3   gabapentin (NEURONTIN) 100 MG capsule Take 100-200 mg by mouth See admin instructions. Take 200 mg in the morning and 100 mg at night     glucosamine-chondroitin 500-400 MG tablet Take 1 tablet by mouth every morning.      halobetasol (ULTRAVATE) 0.05 % cream Apply 1 application topically 2 (two) times daily as needed (psoriasis).      ibandronate (BONIVA) 150 MG tablet Take 150 mg by mouth every 30 (thirty) days.     Insulin Glargine (BASAGLAR KWIKPEN) 100 UNIT/ML SOPN Inject 45 Units into the skin daily before breakfast.     Insulin Pen Needle 32G X 4 MM MISC USE TWICE DAILY AS DIRECTED     JANUVIA 100 MG tablet Take 100 mg by mouth daily.     ketotifen (ZADITOR) 0.025 % ophthalmic solution Place 1 drop into both eyes daily as needed (for irritation).     Lidocaine-Glycerin (PREPARATION H EX) Place 1 application rectally 2 (two) times daily as needed (for pain).     losartan (COZAAR) 100 MG tablet TAKE 1 TABLET BY MOUTH EVERY DAY 90 tablet 3   magnesium oxide (MAG-OX) 400 (241.3 Mg) MG tablet Take 1 tablet (400 mg total) by mouth 2 (two) times daily. Please make yearly appt with Dr. [name] for April 2022 for future refills. Thank you 1st attempt 60 tablet 2   methenamine (HIPREX) 1 g tablet Take 1 g by mouth 2 (two) times daily.     OXYGEN Inhale 3 L/min into the lungs continuous.     potassium chloride SA (KLOR-CON M) 20 MEQ tablet Take 1 tablet (20 mEq total) by mouth 3 (three) times a week. 30 tablet 0   tamsulosin (FLOMAX) 0.4 MG CAPS capsule Take 0.4 mg by mouth daily.     tiZANidine (ZANAFLEX) 4 MG tablet Take 4 mg by mouth at bedtime.     traMADol (ULTRAM) 50 MG tablet as needed.     VOLTAREN 1 % GEL Apply 2 g topically 4 (four) times daily as needed (for arthritic pain).  2   No current facility-administered medications for this visit.    Allergies  Allergen Reactions   Meloxicam Other (See Comments)    Causes excess Fluid buildup    Social History   Socioeconomic History   Marital status: Married    Spouse name: Engineer, water   Number of children: 3   Years of education: Not on file   Highest education level: Not on file  Occupational History   Occupation: retired    Fish farm manager: UNEMPLOYED   Tobacco Use   Smoking status: Former    Packs/day: 0.50    Years: 18.00    Pack years: 9.00    Types: Cigarettes    Quit date: 09/16/1990    Years since quitting: 31.0   Smokeless tobacco: Never  Vaping Use   Vaping Use: Never used  Substance and Sexual Activity   Alcohol use: No    Alcohol/week: 0.0 standard drinks   Drug  use: No   Sexual activity: Not on file  Other Topics Concern   Not on file  Social History Narrative   Not on file   Social Determinants of Health   Financial Resource Strain: Not on file  Food Insecurity: Not on file  Transportation Needs: Not on file  Physical Activity: Not on file  Stress: Not on file  Social Connections: Not on file  Intimate Partner Violence: Not on file     Review of Systems: All other systems reviewed and are otherwise negative except as noted above.  Physical Exam: Vitals:   10/02/21 0838  BP: (!) 156/76  Pulse: 66  SpO2: 97%  Weight: 206 lb (93.4 kg)  Height: 5\' 8"  (1.727 m)    GEN- The patient is well appearing, alert and oriented x 3 today.   HEENT: normocephalic, atraumatic; sclera clear, conjunctiva pink; hearing intact; oropharynx clear; neck supple, no JVP Lymph- no cervical lymphadenopathy Lungs- Clear to ausculation bilaterally, normal work of breathing.  No wheezes, rales, rhonchi Heart- Regular rate and rhythm, no murmurs, rubs or gallops, PMI not laterally displaced GI- soft, non-tender, non-distended, bowel sounds present, no hepatosplenomegaly Extremities- no clubbing, cyanosis, or edema; DP/PT/radial pulses 2+ bilaterally MS- no significant deformity or atrophy Skin- warm and dry, no rash or lesion Psych- euthymic mood, full affect Neuro- strength and sensation are intact  EKG is not ordered.   Additional studies reviewed include: Previous EP office notes.   Assessment and Plan:  1. Paroxysmal atrial fibrillation Echo 08/17/2021 LVEF 60-65%. EKG 09/04/21 showed stable QTc on tikosyn 250 mcg  BID Continue Eliquis for CHA2DS2/VASc at least 6.  2. HTN Stable on current regimen    3. COPD On home 02   4. Chronic diastolic CHF Echo 41/9622 60-65% Continue lasix 80 mg daily. Hold lasix for weight < 198 lbs. Take extra 40 mg for weight > 206-207 per previous instructions  5. Cardiac Clearance for cystoscopy/biopsy We have not yet received clearance request, but should likely at least moderate risk if not high risk for sedation given chronic O2. Normal EF.  Her volume status is optimized, and I'm not sure there is anything else to do currently to further minimize her risk.  Discussed case with Dr. [name] who states risk is "acceptable" for this particular procedure.  We are still awaiting actual clearance request from the office.  Typically we recommend holding Eliquis for only 48 hours; Again, awaiting actual clearance request for specifics.  She should not hold tikosyn for any doses.     Follow up with EP APP in 3 months   Shirley Friar, [place]  10/02/21 8:48 AM

## 2021-10-02 ENCOUNTER — Encounter: Payer: Self-pay | Admitting: Student

## 2021-10-02 ENCOUNTER — Other Ambulatory Visit: Payer: Self-pay

## 2021-10-02 ENCOUNTER — Ambulatory Visit (INDEPENDENT_AMBULATORY_CARE_PROVIDER_SITE_OTHER): Payer: Medicare Other | Admitting: Student

## 2021-10-02 VITALS — BP 156/76 | HR 66 | Ht 68.0 in | Wt 206.0 lb

## 2021-10-02 DIAGNOSIS — Z01818 Encounter for other preprocedural examination: Secondary | ICD-10-CM

## 2021-10-02 DIAGNOSIS — I5033 Acute on chronic diastolic (congestive) heart failure: Secondary | ICD-10-CM

## 2021-10-02 DIAGNOSIS — I4891 Unspecified atrial fibrillation: Secondary | ICD-10-CM | POA: Diagnosis not present

## 2021-10-02 DIAGNOSIS — Z01810 Encounter for preprocedural cardiovascular examination: Secondary | ICD-10-CM

## 2021-10-02 DIAGNOSIS — I10 Essential (primary) hypertension: Secondary | ICD-10-CM | POA: Diagnosis not present

## 2021-10-02 DIAGNOSIS — J449 Chronic obstructive pulmonary disease, unspecified: Secondary | ICD-10-CM

## 2021-10-02 LAB — BASIC METABOLIC PANEL
BUN/Creatinine Ratio: 19 (ref 12–28)
BUN: 14 mg/dL (ref 8–27)
CO2: 28 mmol/L (ref 20–29)
Calcium: 9.6 mg/dL (ref 8.7–10.3)
Chloride: 102 mmol/L (ref 96–106)
Creatinine, Ser: 0.74 mg/dL (ref 0.57–1.00)
Glucose: 144 mg/dL — ABNORMAL HIGH (ref 70–99)
Potassium: 4.6 mmol/L (ref 3.5–5.2)
Sodium: 139 mmol/L (ref 134–144)
eGFR: 84 mL/min/{1.73_m2} (ref >59)

## 2021-10-02 LAB — MAGNESIUM: Magnesium: 1.9 mg/dL (ref 1.6–2.3)

## 2021-10-02 NOTE — Patient Instructions (Signed)
Medication Instructions:  Your physician recommends that you continue on your current medications as directed. Please refer to the Current Medication list given to you today.  *If you need a refill on your cardiac medications before your next appointment, please call your pharmacy*   Lab Work: TODAY: BMET, Mag  If you have labs (blood work) drawn today and your tests are completely normal, you will receive your results only by: [place] (if you have MyChart) OR A paper copy in the mail If you have any lab test that is abnormal or we need to change your treatment, we will call you to review the results.   Follow-Up: At [facility], you and your health needs are our priority.  As part of our continuing mission to provide you with exceptional heart care, we have created designated Provider Care Teams.  These Care Teams include your primary Cardiologist (physician) and Advanced Practice Providers (APPs -  Physician Assistants and Nurse Practitioners) who all work together to provide you with the care you need, when you need it.   Your next appointment:   01/07/2022

## 2021-10-05 ENCOUNTER — Other Ambulatory Visit: Payer: Self-pay | Admitting: Internal Medicine

## 2021-10-08 ENCOUNTER — Telehealth: Payer: Self-pay | Admitting: *Deleted

## 2021-10-08 NOTE — Telephone Encounter (Signed)
° °  Primary Cardiologist: Sherren Mocha, MD  Chart reviewed as part of pre-operative protocol coverage. Given past medical history and time since last visit, based on ACC/AHA guidelines, ANAISA RADI would be at acceptable risk for the planned procedure without further cardiovascular testing.   Cardiac clearance request received on 10/08/2021 which is the date of her cystoscopy procedure.  She may hold her apixaban 2 days prior to her procedure.  Please resume as soon as hemostasis is achieved.  I will route this recommendation to the requesting party via Epic fax function and remove from pre-op pool.  Please call with questions.  Jossie Ng. Tianne Plott NP-C    10/08/2021, 3:45 PM [place] [address] Office [phone] Fax [phone]

## 2021-10-08 NOTE — Telephone Encounter (Signed)
° °  Pre-operative Risk Assessment    Patient Name: Debbie [name]  DOB: 15-Jan-1946 MRN: [id number]     SEE NOTES FROM Joesph July, [facility] 10/02/21 Request for Surgical Clearance    Procedure:   CYSTOSCOPY RIGHT  URETEROSCOPY bilateral retrograde pyelogram , possible biopsy  Date of Surgery:  Clearance 10/08/21                                 Surgeon:  DR. Ky Barban Surgeon's Group or Practice Name:  [facility] Phone number:  [phone] Fax number:  [phone]   Type of Clearance Requested:   - Medical  - Pharmacy:  Hold Apixaban (Eliquis) x 2 DAYS PRIOR   Type of Anesthesia:  Not Indicated   Additional requests/questions:    Jiles Prows   10/08/2021, 3:06 PM

## 2021-10-17 ENCOUNTER — Emergency Department (HOSPITAL_BASED_OUTPATIENT_CLINIC_OR_DEPARTMENT_OTHER): Payer: Medicare Other | Admitting: Radiology

## 2021-10-17 ENCOUNTER — Other Ambulatory Visit: Payer: Self-pay

## 2021-10-17 ENCOUNTER — Emergency Department (HOSPITAL_BASED_OUTPATIENT_CLINIC_OR_DEPARTMENT_OTHER): Payer: Medicare Other

## 2021-10-17 ENCOUNTER — Inpatient Hospital Stay (HOSPITAL_BASED_OUTPATIENT_CLINIC_OR_DEPARTMENT_OTHER)
Admission: EM | Admit: 2021-10-17 | Discharge: 2021-10-19 | DRG: 812 | Disposition: A | Discharge status: Home / Self Care | Payer: Medicare Other | Attending: Internal Medicine | Admitting: Internal Medicine

## 2021-10-17 ENCOUNTER — Encounter (HOSPITAL_BASED_OUTPATIENT_CLINIC_OR_DEPARTMENT_OTHER): Payer: Self-pay | Admitting: *Deleted

## 2021-10-17 DIAGNOSIS — Z20822 Contact with and (suspected) exposure to covid-19: Secondary | ICD-10-CM | POA: Diagnosis present

## 2021-10-17 DIAGNOSIS — I5032 Chronic diastolic (congestive) heart failure: Secondary | ICD-10-CM | POA: Diagnosis present

## 2021-10-17 DIAGNOSIS — R002 Palpitations: Secondary | ICD-10-CM | POA: Diagnosis present

## 2021-10-17 DIAGNOSIS — Z825 Family history of asthma and other chronic lower respiratory diseases: Secondary | ICD-10-CM

## 2021-10-17 DIAGNOSIS — Z7983 Long term (current) use of bisphosphonates: Secondary | ICD-10-CM

## 2021-10-17 DIAGNOSIS — D649 Anemia, unspecified: Secondary | ICD-10-CM | POA: Diagnosis not present

## 2021-10-17 DIAGNOSIS — Z79899 Other long term (current) drug therapy: Secondary | ICD-10-CM

## 2021-10-17 DIAGNOSIS — E1169 Type 2 diabetes mellitus with other specified complication: Secondary | ICD-10-CM | POA: Diagnosis present

## 2021-10-17 DIAGNOSIS — Z87891 Personal history of nicotine dependence: Secondary | ICD-10-CM

## 2021-10-17 DIAGNOSIS — Z79890 Hormone replacement therapy (postmenopausal): Secondary | ICD-10-CM

## 2021-10-17 DIAGNOSIS — I480 Paroxysmal atrial fibrillation: Secondary | ICD-10-CM | POA: Diagnosis present

## 2021-10-17 DIAGNOSIS — D62 Acute posthemorrhagic anemia: Secondary | ICD-10-CM | POA: Diagnosis present

## 2021-10-17 DIAGNOSIS — Z9981 Dependence on supplemental oxygen: Secondary | ICD-10-CM | POA: Diagnosis not present

## 2021-10-17 DIAGNOSIS — Z801 Family history of malignant neoplasm of trachea, bronchus and lung: Secondary | ICD-10-CM

## 2021-10-17 DIAGNOSIS — R319 Hematuria, unspecified: Secondary | ICD-10-CM | POA: Diagnosis present

## 2021-10-17 DIAGNOSIS — E1159 Type 2 diabetes mellitus with other circulatory complications: Secondary | ICD-10-CM | POA: Diagnosis present

## 2021-10-17 DIAGNOSIS — Z803 Family history of malignant neoplasm of breast: Secondary | ICD-10-CM

## 2021-10-17 DIAGNOSIS — N029 Recurrent and persistent hematuria with unspecified morphologic changes: Secondary | ICD-10-CM | POA: Diagnosis present

## 2021-10-17 DIAGNOSIS — J9611 Chronic respiratory failure with hypoxia: Secondary | ICD-10-CM | POA: Diagnosis present

## 2021-10-17 DIAGNOSIS — Z823 Family history of stroke: Secondary | ICD-10-CM | POA: Diagnosis not present

## 2021-10-17 DIAGNOSIS — Z87440 Personal history of urinary (tract) infections: Secondary | ICD-10-CM | POA: Diagnosis not present

## 2021-10-17 DIAGNOSIS — Z8249 Family history of ischemic heart disease and other diseases of the circulatory system: Secondary | ICD-10-CM

## 2021-10-17 DIAGNOSIS — E1151 Type 2 diabetes mellitus with diabetic peripheral angiopathy without gangrene: Secondary | ICD-10-CM | POA: Diagnosis present

## 2021-10-17 DIAGNOSIS — J449 Chronic obstructive pulmonary disease, unspecified: Secondary | ICD-10-CM | POA: Diagnosis present

## 2021-10-17 DIAGNOSIS — E785 Hyperlipidemia, unspecified: Secondary | ICD-10-CM | POA: Diagnosis present

## 2021-10-17 DIAGNOSIS — Z7901 Long term (current) use of anticoagulants: Secondary | ICD-10-CM | POA: Diagnosis not present

## 2021-10-17 DIAGNOSIS — Z794 Long term (current) use of insulin: Secondary | ICD-10-CM | POA: Diagnosis not present

## 2021-10-17 DIAGNOSIS — I152 Hypertension secondary to endocrine disorders: Secondary | ICD-10-CM | POA: Diagnosis present

## 2021-10-17 DIAGNOSIS — Z89511 Acquired absence of right leg below knee: Secondary | ICD-10-CM | POA: Diagnosis not present

## 2021-10-17 DIAGNOSIS — Z7984 Long term (current) use of oral hypoglycemic drugs: Secondary | ICD-10-CM

## 2021-10-17 DIAGNOSIS — E1142 Type 2 diabetes mellitus with diabetic polyneuropathy: Secondary | ICD-10-CM | POA: Diagnosis present

## 2021-10-17 DIAGNOSIS — Z833 Family history of diabetes mellitus: Secondary | ICD-10-CM

## 2021-10-17 LAB — CBC
HCT: 26.1 % — ABNORMAL LOW (ref 36.0–46.0)
Hemoglobin: 7.7 g/dL — ABNORMAL LOW (ref 12.0–15.0)
MCH: 21.6 pg — ABNORMAL LOW (ref 26.0–34.0)
MCHC: 29.5 g/dL — ABNORMAL LOW (ref 30.0–36.0)
MCV: 73.3 fL — ABNORMAL LOW (ref 80.0–100.0)
Platelets: 334 10*3/uL (ref 150–400)
RBC: 3.56 MIL/uL — ABNORMAL LOW (ref 3.87–5.11)
RDW: 18.4 % — ABNORMAL HIGH (ref 11.5–15.5)
WBC: 11.7 10*3/uL — ABNORMAL HIGH (ref 4.0–10.5)
nRBC: 0 % (ref 0.0–0.2)

## 2021-10-17 LAB — RESP PANEL BY RT-PCR (FLU A&B, COVID) ARPGX2
Influenza A by PCR: NEGATIVE
Influenza B by PCR: NEGATIVE
SARS Coronavirus 2 by RT PCR: NEGATIVE

## 2021-10-17 LAB — GLUCOSE, CAPILLARY: Glucose-Capillary: 131 mg/dL — ABNORMAL HIGH (ref 70–99)

## 2021-10-17 LAB — BASIC METABOLIC PANEL
Anion gap: 10 (ref 5–15)
BUN: 10 mg/dL (ref 8–23)
CO2: 23 mmol/L (ref 22–32)
Calcium: 8.7 mg/dL — ABNORMAL LOW (ref 8.9–10.3)
Chloride: 105 mmol/L (ref 98–111)
Creatinine, Ser: 0.86 mg/dL (ref 0.44–1.00)
GFR, Estimated: >60 mL/min (ref >60)
Glucose, Bld: 148 mg/dL — ABNORMAL HIGH (ref 70–99)
Potassium: 3.8 mmol/L (ref 3.5–5.1)
Sodium: 138 mmol/L (ref 135–145)

## 2021-10-17 LAB — TROPONIN I (HIGH SENSITIVITY)
Troponin I (High Sensitivity): 5 ng/L (ref <18)
Troponin I (High Sensitivity): 5 ng/L (ref <18)

## 2021-10-17 LAB — RETICULOCYTES
Immature Retic Fract: 13.3 % (ref 2.3–15.9)
RBC.: 3.21 MIL/uL — ABNORMAL LOW (ref 3.87–5.11)
Retic Count, Absolute: 54.2 10*3/uL (ref 19.0–186.0)
Retic Ct Pct: 1.7 % (ref 0.4–3.1)

## 2021-10-17 LAB — URINALYSIS, ROUTINE W REFLEX MICROSCOPIC
Bilirubin Urine: NEGATIVE
Glucose, UA: NEGATIVE mg/dL
Nitrite: NEGATIVE
Protein, ur: 100 mg/dL — AB
RBC / HPF: >50 RBC/hpf — ABNORMAL HIGH (ref 0–5)
Specific Gravity, Urine: 1.022 (ref 1.005–1.030)
WBC, UA: >50 WBC/hpf — ABNORMAL HIGH (ref 0–5)
pH: 6 (ref 5.0–8.0)

## 2021-10-17 LAB — MAGNESIUM: Magnesium: 1.9 mg/dL (ref 1.7–2.4)

## 2021-10-17 LAB — FOLATE: Folate: 33.5 ng/mL (ref >5.9)

## 2021-10-17 LAB — BRAIN NATRIURETIC PEPTIDE: B Natriuretic Peptide: 53.4 pg/mL (ref 0.0–100.0)

## 2021-10-17 LAB — FERRITIN: Ferritin: 3 ng/mL — ABNORMAL LOW (ref 11–307)

## 2021-10-17 LAB — IRON AND TIBC
Iron: 16 ug/dL — ABNORMAL LOW (ref 28–170)
Saturation Ratios: 4 % — ABNORMAL LOW (ref 10.4–31.8)
TIBC: 445 ug/dL (ref 250–450)
UIBC: 429 ug/dL

## 2021-10-17 LAB — ABO/RH: ABO/RH(D): O POS

## 2021-10-17 LAB — VITAMIN B12: Vitamin B-12: 478 pg/mL (ref 180–914)

## 2021-10-17 LAB — CBG MONITORING, ED: Glucose-Capillary: 161 mg/dL — ABNORMAL HIGH (ref 70–99)

## 2021-10-17 LAB — PREPARE RBC (CROSSMATCH)

## 2021-10-17 MED ORDER — SENNOSIDES-DOCUSATE SODIUM 8.6-50 MG PO TABS: 1.0000 | ORAL_TABLET | Freq: Every evening | ORAL | Status: DC | PRN

## 2021-10-17 MED ORDER — INSULIN ASPART 100 UNIT/ML IJ SOLN
0.0000 [IU] | Freq: Three times a day (TID) | INTRAMUSCULAR | Status: DC
  Administered 2021-10-18 (×2): 2 [IU] via SUBCUTANEOUS
  Administered 2021-10-18: 3 [IU] via SUBCUTANEOUS
  Administered 2021-10-19: 1 [IU] via SUBCUTANEOUS

## 2021-10-17 MED ORDER — GABAPENTIN 100 MG PO CAPS
100.0000 mg | ORAL_CAPSULE | Freq: Every day | ORAL | Status: DC
  Filled 2021-10-17: qty 1

## 2021-10-17 MED ORDER — FUROSEMIDE 40 MG PO TABS
80.0000 mg | ORAL_TABLET | Freq: Every day | ORAL | Status: DC
  Administered 2021-10-18: 80 mg via ORAL
  Filled 2021-10-17 (×2): qty 2

## 2021-10-17 MED ORDER — SODIUM CHLORIDE 0.45 % IV SOLN: INTRAVENOUS | Status: DC

## 2021-10-17 MED ORDER — SODIUM CHLORIDE 0.9 % IV SOLN
1.0000 g | INTRAVENOUS | Status: DC
  Administered 2021-10-18: 1 g via INTRAVENOUS
  Filled 2021-10-17 (×2): qty 10

## 2021-10-17 MED ORDER — MAGNESIUM OXIDE -MG SUPPLEMENT 400 (240 MG) MG PO TABS
400.0000 mg | ORAL_TABLET | Freq: Two times a day (BID) | ORAL | Status: DC
  Administered 2021-10-17 – 2021-10-19 (×4): 400 mg via ORAL
  Filled 2021-10-17 (×4): qty 1

## 2021-10-17 MED ORDER — LOSARTAN POTASSIUM 50 MG PO TABS
100.0000 mg | ORAL_TABLET | Freq: Every day | ORAL | Status: DC
  Administered 2021-10-18 – 2021-10-19 (×2): 100 mg via ORAL
  Filled 2021-10-17 (×2): qty 2

## 2021-10-17 MED ORDER — SODIUM CHLORIDE 0.9% IV SOLUTION: Freq: Once | INTRAVENOUS | Status: AC

## 2021-10-17 MED ORDER — ONDANSETRON HCL 4 MG PO TABS: 4.0000 mg | ORAL_TABLET | Freq: Four times a day (QID) | ORAL | Status: DC | PRN

## 2021-10-17 MED ORDER — GABAPENTIN 100 MG PO CAPS
200.0000 mg | ORAL_CAPSULE | Freq: Every day | ORAL | Status: DC
  Administered 2021-10-18 – 2021-10-19 (×2): 200 mg via ORAL
  Filled 2021-10-17 (×2): qty 2

## 2021-10-17 MED ORDER — SODIUM CHLORIDE 0.9 % IV SOLN
1.0000 g | Freq: Once | INTRAVENOUS | Status: AC
  Administered 2021-10-17: 1 g via INTRAVENOUS
  Filled 2021-10-17: qty 10

## 2021-10-17 MED ORDER — EZETIMIBE 10 MG PO TABS
10.0000 mg | ORAL_TABLET | Freq: Every evening | ORAL | Status: DC
  Administered 2021-10-17 – 2021-10-18 (×2): 10 mg via ORAL
  Filled 2021-10-17 (×2): qty 1

## 2021-10-17 MED ORDER — DOFETILIDE 250 MCG PO CAPS
250.0000 ug | ORAL_CAPSULE | Freq: Two times a day (BID) | ORAL | Status: DC
  Administered 2021-10-17 – 2021-10-19 (×4): 250 ug via ORAL
  Filled 2021-10-17 (×4): qty 1

## 2021-10-17 MED ORDER — ACETAMINOPHEN 325 MG PO TABS: 650.0000 mg | ORAL_TABLET | Freq: Four times a day (QID) | ORAL | Status: DC | PRN

## 2021-10-17 MED ORDER — SODIUM CHLORIDE 0.9% FLUSH
3.0000 mL | Freq: Two times a day (BID) | INTRAVENOUS | Status: DC
  Administered 2021-10-17 – 2021-10-19 (×4): 3 mL via INTRAVENOUS

## 2021-10-17 MED ORDER — PANTOPRAZOLE SODIUM 40 MG PO TBEC
40.0000 mg | DELAYED_RELEASE_TABLET | Freq: Every day | ORAL | Status: DC
  Administered 2021-10-18 – 2021-10-19 (×2): 40 mg via ORAL
  Filled 2021-10-17 (×2): qty 1

## 2021-10-17 MED ORDER — ACETAMINOPHEN 650 MG RE SUPP: 650.0000 mg | Freq: Four times a day (QID) | RECTAL | Status: DC | PRN

## 2021-10-17 MED ORDER — POTASSIUM CHLORIDE CRYS ER 20 MEQ PO TBCR
20.0000 meq | EXTENDED_RELEASE_TABLET | ORAL | Status: DC
  Administered 2021-10-17: 20 meq via ORAL
  Filled 2021-10-17: qty 1

## 2021-10-17 MED ORDER — ONDANSETRON HCL 4 MG/2ML IJ SOLN
4.0000 mg | Freq: Four times a day (QID) | INTRAMUSCULAR | Status: DC | PRN
  Filled 2021-10-17: qty 2

## 2021-10-17 MED ORDER — DILTIAZEM HCL ER COATED BEADS 240 MG PO CP24
240.0000 mg | ORAL_CAPSULE | Freq: Every day | ORAL | Status: DC
  Administered 2021-10-18 – 2021-10-19 (×2): 240 mg via ORAL
  Filled 2021-10-17 (×2): qty 1

## 2021-10-17 NOTE — Hospital Course (Addendum)
with medical history significant for paroxysmal atrial fibrillation on Eliquis, recurrent UTI with hematuria, chronic diastolic CHF (EF 04-88% 89/1694), COPD/hypoxic respiratory failure on 3 L of home O2, T2DM, HTN, HLD, s/p right BKA who is admitted with palpitation, shortness of breath and found to have symptomatic anemia with a hemoglobin of 7.7 g in the ED. Patient has history of recurrent UTI with hematuria.She says hematuria usually last 2-3 weeks after she has a UTI-due to recurrence of gross hematuria, she underwent cystoscopy with right ureteroscopy by her urologist,Dr. Peterson Lombard, with [facility] on 10/08/2021.  UA-showed negative nitrates, moderate leukocytes, >50 RBCs and WBC/hpf, few bacteria microscopy.Urine culture sent, cxr-increased interstitial changes without pleural effusion or pneumothorax. Patient was given IV ceftriaxone and started on maintenance fluids with half-normal saline.  1 unit PRBC ordered and admitted. Hemoglobin has improved, at this time hematuria has resolved after holding Eliquis.  Discussed with Dr. [name] from cardiology agrees for holding for next few more days patient may resume back on Monday.  Complete antibiotics 2 more days

## 2021-10-17 NOTE — ED Provider Notes (Signed)
[facility] EMERGENCY DEPT Provider Note   CSN: [id number] Arrival date & time: 10/17/21  1201     History  Chief Complaint  Patient presents with   Palpitations    Debbie [name] is a 76 y.o. female.  Patient presents chief complaint of palpitations, shortness of breath.  She is on Eliquis at baseline for atrial fibrillation.  She requires 2 L nasal cannula for history of chronic lung disease.  She otherwise denies any new fevers no cough no vomiting or diarrhea no headache no chest pain no abdominal pain.  She is struggling with hematuria for several months now.  She saw urology with the [facility] and had a renal biopsy done last week.  She states that she still has intermittent hematuria although currently she states that it is much improvement her urine just looks mildly pink.      Home Medications Prior to Admission medications   Medication Sig Start Date End Date Taking? Authorizing Provider  acetaminophen (TYLENOL) 500 MG tablet Take 500 mg by mouth every 6 (six) hours as needed for mild pain (or headaches).    [provider]  apixaban (ELIQUIS) 5 MG TABS tablet Take 1 tablet (5 mg total) by mouth 2 (two) times daily. 09/24/19   Florencia Reasons, MD  BD PEN NEEDLE NANO U/F 32G X 4 MM MISC 2 (two) times daily. as directed 02/26/15   [provider]  Calcium Carb-Cholecalciferol (CALCIUM 1000 + D PO) Take 1,000 mg by mouth daily.    [provider]  cetirizine (ZYRTEC) 10 MG tablet Take by mouth as needed.    [provider]  Coenzyme Q10 400 MG CAPS Take 400 mg by mouth daily.     [provider]  dexlansoprazole (DEXILANT) 60 MG capsule Take 1 capsule (60 mg total) by mouth daily. 09/29/15   Tanda Rockers, MD  diltiazem (CARDIZEM CD) 240 MG 24 hr capsule Take 1 capsule (240 mg total) by mouth daily. 09/25/19   Florencia Reasons, MD  dofetilide [facility]) 250 MCG capsule TAKE 1 CAPSULE BY MOUTH TWICE A DAY 10/05/21   Evans Lance, MD  estradiol (ESTRACE) 0.1 MG/GM vaginal cream Place vaginally. 04/19/21   [provider]  ezetimibe (ZETIA) 10 MG tablet Take 10 mg by mouth every evening.  08/27/19   [provider]  fluticasone (FLONASE) 50 MCG/ACT nasal spray Place 1 spray into both nostrils daily as needed for allergies.    [provider]  furosemide (LASIX) 40 MG tablet Take 2 tablets (80 mg total) by mouth daily. 09/04/21 12/03/21  Shirley Friar, PA-C  gabapentin (NEURONTIN) 100 MG capsule Take 100-200 mg by mouth See admin instructions. Take 200 mg in the morning and 100 mg at night    [provider]  glucosamine-chondroitin 500-400 MG tablet Take 1 tablet by mouth every morning.    [provider]  halobetasol (ULTRAVATE) 0.05 % cream Apply 1 application topically 2 (two) times daily as needed (psoriasis).  08/15/16   [provider]  ibandronate (BONIVA) 150 MG tablet Take 150 mg by mouth every 30 (thirty) days. 02/15/20   [provider]  Insulin Glargine (BASAGLAR KWIKPEN) 100 UNIT/ML SOPN Inject 45 Units into the skin daily before breakfast.    [provider]  Insulin Pen Needle 32G X 4 MM MISC USE TWICE DAILY AS DIRECTED 05/31/15   [provider]  JANUVIA 100 MG tablet Take 100 mg by mouth daily. 04/05/21  [provider]  ketotifen (ZADITOR) 0.025 % ophthalmic solution Place 1 drop into both eyes daily as needed (for irritation).    [provider]  Lidocaine-Glycerin (PREPARATION H EX) Place 1 application rectally 2 (two) times daily as needed (for pain).    [provider]  losartan (COZAAR) 100 MG tablet TAKE 1 TABLET BY MOUTH EVERY DAY 06/08/21   Evans Lance, MD  magnesium oxide (MAG-OX) 400 (241.3 Mg) MG tablet Take 1 tablet (400 mg total) by mouth 2 (two) times daily. Please make yearly appt with Dr. [name] for April 2022 for future refills. Thank you 1st attempt 10/18/20   Evans Lance, MD  methenamine (HIPREX) 1 g tablet Take 1 g by mouth 2 (two) times daily. 05/17/21   [provider]  OXYGEN Inhale 3 L/min into the lungs continuous.    [provider]  potassium chloride SA (KLOR-CON M) 20 MEQ tablet Take 1 tablet (20 mEq total) by mouth 3 (three) times a week. 08/20/21   Geradine Girt, DO  tiZANidine (ZANAFLEX) 4 MG tablet Take 4 mg by mouth at bedtime. 11/04/19   [provider]  traMADol (ULTRAM) 50 MG tablet as needed. 09/24/21   [provider]  VOLTAREN 1 % GEL Apply 2 g topically 4 (four) times daily as needed (for arthritic pain). 08/13/17   [provider]      Allergies    Meloxicam    Review of Systems   Review of Systems  Constitutional:  Negative for fever.  HENT:  Negative for ear pain.   Eyes:  Negative for pain.  Respiratory:  Positive for shortness of breath. Negative for cough.   Cardiovascular:  Negative for chest pain.  Gastrointestinal:  Negative for abdominal pain.  Genitourinary:  Negative for flank pain.  Musculoskeletal:  Negative for back pain.  Skin:  Negative for rash.  Neurological:  Negative for headaches.   Physical Exam Updated Vital Signs BP (!) 155/63    Pulse 71    Temp 98.2 F (36.8 C) (Oral)    Resp 19    SpO2 97%  Physical Exam Constitutional:      General: She is not in acute distress.    Appearance: Normal appearance.  HENT:     Head: Normocephalic.     Nose: Nose normal.  Eyes:     Extraocular Movements: Extraocular movements intact.  Cardiovascular:     Rate and Rhythm: Tachycardia present.  Pulmonary:     Effort: Pulmonary effort is normal.  Abdominal:     Tenderness: There is no abdominal tenderness. There is no guarding or rebound.  Musculoskeletal:        General: Normal range of motion.     Cervical back: Normal range of motion.  Neurological:     General: No focal deficit present.     Mental Status: She is alert. Mental status is at baseline.    ED  Results / Procedures / Treatments   Labs (all labs ordered are listed, but only abnormal results are displayed) Labs Reviewed  BASIC METABOLIC PANEL - Abnormal; Notable for the following components:      Result Value   Glucose, Bld 148 (*)    Calcium 8.7 (*)    All other components within normal limits  CBC - Abnormal; Notable for the following components:   WBC 11.7 (*)    RBC 3.56 (*)    Hemoglobin 7.7 (*)    HCT 26.1 (*)  MCV 73.3 (*)    MCH 21.6 (*)    MCHC 29.5 (*)    RDW 18.4 (*)    All other components within normal limits  URINALYSIS, ROUTINE W REFLEX MICROSCOPIC - Abnormal; Notable for the following components:   Color, Urine ORANGE (*)    APPearance CLOUDY (*)    Hgb urine dipstick LARGE (*)    Ketones, ur TRACE (*)    Protein, ur 100 (*)    Leukocytes,Ua MODERATE (*)    RBC / HPF >50 (*)    WBC, UA >50 (*)    Bacteria, UA FEW (*)    All other components within normal limits  RESP PANEL BY RT-PCR (FLU A&B, COVID) ARPGX2  BRAIN NATRIURETIC PEPTIDE  MAGNESIUM  VITAMIN B12  FOLATE  IRON AND TIBC  FERRITIN  RETICULOCYTES  TROPONIN I (HIGH SENSITIVITY)  TROPONIN I (HIGH SENSITIVITY)    EKG EKG Interpretation  Date/Time:  Wednesday October 17 2021 12:19:10 EST Ventricular Rate:  116 PR Interval:  172 QRS Duration: 76 QT Interval:  342 QTC Calculation: 475 R Axis:   -23 Text Interpretation: Sinus tachycardia with Premature atrial complexes Anterior infarct , age undetermined Abnormal ECG When compared with ECG of 17-Aug-2021 22:28, Premature atrial complexes are now Present Vent. rate has increased BY  55 BPM Anterior infarct is now Present Confirmed by Thamas Jaegers ([id number]) on 10/17/2021 1:25:00 PM  Radiology DG Chest Port 1 View  Result Date: 10/17/2021 CLINICAL DATA:  Palpitations EXAM: PORTABLE CHEST 1 VIEW COMPARISON:  08/16/2021 FINDINGS: Increased interstitial changes. No pleural effusion or pneumothorax. Similar cardiomediastinal contours. IMPRESSION:  Increased interstitial changes may reflect mild edema superimposed on chronic findings. Electronically Signed   By: Macy Mis M.D.   On: 10/17/2021 13:46    Procedures Procedures    Medications Ordered in ED Medications  0.45 % sodium chloride infusion (has no administration in time range)    ED Course/ Medical Decision Making/ A&P                           Medical Decision Making Amount and/or Complexity of Data Reviewed Labs: ordered. Radiology: ordered.   History obtained from patient as well as her daughter at bedside.  Review of charts show procedure on October 08, 2021 by urology.  Work-up today shows severe anemia hemoglobin 7.7.  Chemistry labs otherwise unremarkable.  I feel this is likely the cause of the patient's symptoms.  I feel she will benefit from transfusion.  Risk benefits discussed.  Patient to be admitted to hospital for blood transfusion.  Case discussed with hospitalist who will bring the patient in for further evaluation and blood transfusion.  No blood available here at free standing ER.         Final Clinical Impression(s) / ED Diagnoses Final diagnoses:  Palpitations    Rx / DC Orders ED Discharge Orders     None         Luna Fuse, MD 10/17/21 1506

## 2021-10-17 NOTE — H&P (Signed)
History and Physical    Debbie [name] JOI:[id number] DOB: 05-09-1946 DOA: 10/17/2021  PCP: [name], MD  Patient coming from: Home  I have personally briefly reviewed patient's old medical records in [place]  Chief Complaint: Palpitations, shortness of breath  HPI: Debbie [name] is a 76 y.o. female with medical history significant for paroxysmal atrial fibrillation on Eliquis, recurrent UTI with hematuria, chronic diastolic CHF (EF 47-09% 62/8366), COPD on 3 L of home O2 via [place] chronically, T2DM, HTN, HLD, s/p right BKA who presented to the ED for evaluation of palpitations and shortness of breath.  Patient reports a history of recurrent UTI with hematuria.  She says hematuria usually last 2-3 weeks after she has a UTI.  Due to recurrence of gross hematuria, she underwent cystoscopy with right ureteroscopy by her urologist, Dr. Peterson Lombard, with [facility] on 10/08/2021.  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  Patient states that since then, she has had continued gross hematuria.  She initially had Eliquis 3 days prior to her procedure and restarted the day after.  Over the last day she has been experiencing dyspnea with exertion as well as palpitations with activity.  She was concerned that she went back to A. fib and called her PCP for advice.  She was subsequently advised to present to the ED for further evaluation and management.  She otherwise denies any chest pain, nausea, vomiting, abdominal pain, dysuria, melena, hematochezia, hematemesis, hemoptysis.  [facility] ED Course:  Initial vitals showed BP 133/73, pulse 105, RR 16, temp 98.2 F, SPO2 94% on 3 L supplemental O2 via [place].  Labs show hemoglobin 7.7 (baseline 8-10), hematocrit 26.1, MCV 73.3, platelets 334,000, WBC 11.7, sodium 138, potassium 3.8, bicarbonate 23, BUN 10, creatinine 0.6, serum glucose 148, BNP 53.4, troponin  5x2 magnesium 1.9.  Anemia panel ordered and in process.  Urinalysis showed negative nitrates, moderate leukocytes, >50 RBCs and WBC/hpf, few bacteria microscopy.  Urine culture in process.  Portable chest x-ray shows increased interstitial changes without pleural effusion or pneumothorax.  Patient was given IV ceftriaxone and started on maintenance fluids with half-normal saline.  The hospitalist service was consulted to admit for further evaluation and management.  Review of Systems: All systems reviewed and are negative except as documented in history of present illness above.   Past Medical History:  Diagnosis Date   A-fib ([place]) 11/15/2019   Abnormal liver function    Adenomatous polyp 12/04/2006   AKI (acute kidney injury) ([place])    Asthma    DM type 2 (diabetes mellitus, type 2) ([place])    GERD (gastroesophageal reflux disease) 02/28/2012   Hemorrhoid 12/04/2006   Hyperlipidemia    Hypertension    Hypomagnesemia    Hypotension 09/21/2019   Peripheral arterial disease ([place])    Persistent atrial fibrillation ([place]) 11/15/2019   Pulmonary hypertension ([place]) 03/28/2017   Echo 04/04/17 Compared to a prior study in 2015,   there is now moderate LVH and the LVEF is higher at 65-70%. No   obvious PFO noted by saline microbubble contrast. There is   moderate TR with an RVSP of 56 mmHg and a normal, collapsing IVC.   Consistent with moderate pulmonary hypertension.   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 Delight Ovens, adenomatous polyp   COLONOSCOPY  03/25/2012   Procedure: COLONOSCOPY;   Surgeon: Daneil Dolin, MD;  [facility];  Service: Endoscopy;  Laterality: N/A;  10:30   ESOPHAGOGASTRODUODENOSCOPY  11/03/2002   Dr. Gala Romney- normal exam- was done to check for possible foreign body   Fiberoptic bronchoscopy with endobronchial  ultrasound  10/22/2010   [place]   Right BKA  1990   RIGHT HEART CATHETERIZATION N/A 06/22/2014   Procedure: RIGHT HEART CATH;  Surgeon: Larey Dresser, MD;  Location: [facility] CATH LAB;  Service: Cardiovascular;  Laterality: N/A;    Social History:  reports that she quit smoking about 31 years ago. Her smoking use included cigarettes. She has a 9.00 pack-year smoking history. She has never used smokeless tobacco. She reports that she does not drink alcohol and does not use drugs.  Allergies  Allergen Reactions   Meloxicam Other (See Comments)    Causes excess Fluid buildup    Family History  Problem Relation Age of Onset   CVA Mother 46   Heart disease Mother    Diabetes Mother 61   Lung cancer Father        lung carcinoma   COPD Sister    COPD Sister    Diabetes Sister    Breast cancer Sister        Mastectomy   Cancer Maternal Grandmother    Colon cancer Neg Hx      Prior to Admission medications   Medication Sig Start Date End Date Taking? Authorizing Provider  acetaminophen (TYLENOL) 500 MG tablet Take 500 mg by mouth every 6 (six) hours as needed for mild pain (or headaches).    [provider]  apixaban (ELIQUIS) 5 MG TABS tablet Take 1 tablet (5 mg total) by mouth 2 (two) times daily. 09/24/19   Florencia Reasons, MD  BD PEN NEEDLE NANO U/F 32G X 4 MM MISC 2 (two) times daily. as directed 02/26/15   [provider]  Calcium Carb-Cholecalciferol (CALCIUM 1000 + D PO) Take 1,000 mg by mouth daily.    [provider]  cetirizine (ZYRTEC) 10 MG tablet Take by mouth as needed.    [provider]  Coenzyme Q10 400 MG CAPS Take 400 mg by mouth daily.     [provider]  dexlansoprazole  (DEXILANT) 60 MG capsule Take 1 capsule (60 mg total) by mouth daily. 09/29/15   Tanda Rockers, MD  diltiazem (CARDIZEM CD) 240 MG 24 hr capsule Take 1 capsule (240 mg total) by mouth daily. 09/25/19   Florencia Reasons, MD  dofetilide [facility]) 250 MCG capsule TAKE 1 CAPSULE BY MOUTH TWICE A DAY 10/05/21   Evans Lance, MD  estradiol (ESTRACE) 0.1 MG/GM vaginal cream Place vaginally. 04/19/21   [provider]  ezetimibe (ZETIA) 10 MG tablet Take 10 mg by mouth every evening.  08/27/19   [provider]  fluticasone (FLONASE) 50 MCG/ACT nasal spray Place 1 spray into both nostrils daily as needed for allergies.    [provider]  furosemide (LASIX) 40 MG tablet Take 2 tablets (80 mg total) by mouth daily. 09/04/21 12/03/21  Shirley Friar,  PA-C  gabapentin (NEURONTIN) 100 MG capsule Take 100-200 mg by mouth See admin instructions. Take 200 mg in the morning and 100 mg at night    [provider]  glucosamine-chondroitin 500-400 MG tablet Take 1 tablet by mouth every morning.    [provider]  halobetasol (ULTRAVATE) 0.05 % cream Apply 1 application topically 2 (two) times daily as needed (psoriasis).  08/15/16   [provider]  ibandronate (BONIVA) 150 MG tablet Take 150 mg by mouth every 30 (thirty) days. 02/15/20   [provider]  Insulin Glargine (BASAGLAR KWIKPEN) 100 UNIT/ML SOPN Inject 45 Units into the skin daily before breakfast.    [provider]  Insulin Pen Needle 32G X 4 MM MISC USE TWICE DAILY AS DIRECTED 05/31/15   [provider]  JANUVIA 100 MG tablet Take 100 mg by mouth daily. 04/05/21   [provider]  ketotifen (ZADITOR) 0.025 % ophthalmic solution Place 1 drop into both eyes daily as needed (for irritation).    [provider]  Lidocaine-Glycerin (PREPARATION H EX) Place 1 application rectally 2 (two) times daily as needed (for pain).    [provider]  losartan (COZAAR)  100 MG tablet TAKE 1 TABLET BY MOUTH EVERY DAY 06/08/21   Evans Lance, MD  magnesium oxide (MAG-OX) 400 (241.3 Mg) MG tablet Take 1 tablet (400 mg total) by mouth 2 (two) times daily. Please make yearly appt with Dr. [name] for April 2022 for future refills. Thank you 1st attempt 10/18/20   Evans Lance, MD  methenamine (HIPREX) 1 g tablet Take 1 g by mouth 2 (two) times daily. 05/17/21   [provider]  OXYGEN Inhale 3 L/min into the lungs continuous.    [provider]  potassium chloride SA (KLOR-CON M) 20 MEQ tablet Take 1 tablet (20 mEq total) by mouth 3 (three) times a week. 08/20/21   Geradine Girt, DO  tiZANidine (ZANAFLEX) 4 MG tablet Take 4 mg by mouth at bedtime. 11/04/19   [provider]  traMADol (ULTRAM) 50 MG tablet as needed. 09/24/21   [provider]  VOLTAREN 1 % GEL Apply 2 g topically 4 (four) times daily as needed (for arthritic pain). 08/13/17   [provider]    Physical Exam: Vitals:   10/17/21 1836 10/17/21 2150 10/17/21 2259 10/17/21 2322  BP: (!) 159/62 (!) 144/65 (!) 177/74 (!) 172/76  Pulse: 76 74 71 75  Resp: 16 18 18 18   Temp: 98.4 F (36.9 C) 98.4 F (36.9 C) 98 F (36.7 C) 97.9 F (36.6 C)  TempSrc:   Oral Oral  SpO2: 100% 97% 98% 98%   Constitutional: Resting supine in bed, NAD, calm, comfortable Eyes: PERRL, lids and conjunctivae normal ENMT: Mucous membranes are moist. Posterior pharynx clear of any exudate or lesions.Normal dentition.  Neck: normal, supple, no masses. Respiratory: clear to auscultation bilaterally, no wheezing, no crackles. Normal respiratory effort. No accessory muscle use.  Cardiovascular: Regular rate and rhythm, no murmurs / rubs / gallops. No extremity edema. 2+ pedal pulses. Abdomen: no tenderness, no masses palpated. No hepatosplenomegaly. Bowel sounds positive.  Musculoskeletal: S/p right BKA no clubbing / cyanosis. No joint deformity upper and lower extremities. Good ROM, no  contractures. Normal muscle tone.  Skin: no rashes, lesions, ulcers. No induration Neurologic: CN 2-12 grossly intact. Sensation intact. Strength 5/5 in all 4.  Psychiatric: Normal judgment and insight. Alert and oriented x 3. Normal mood.   Labs on  Admission: I have personally reviewed following labs and imaging studies  EKG: Personally reviewed. Sinus rhythm, rate 116, PACs.  Rate is faster when compared to prior.  Assessment/Plan Principal Problem:   Symptomatic anemia Active Problems:   Hematuria, undiagnosed cause with recurrent uti    Paroxysmal atrial fibrillation (HCC)   Chronic diastolic CHF (congestive heart failure) (HCC)   COPD (chronic obstructive pulmonary disease) (HCC)   Chronic respiratory failure with hypoxia (HCC)   Type 2 diabetes mellitus with diabetic polyneuropathy, with long-term current use of insulin ([place])   Hypertension associated with diabetes (HCC)   Debbie [name] is a 75 y.o. female with medical history significant for paroxysmal atrial fibrillation on Eliquis, recurrent UTI with hematuria, chronic diastolic CHF (EF 25-85% 27/7824), COPD on 3 L of home O2 via [place] chronically, T2DM, HTN, HLD, s/p right BKA who is admitted with symptomatic anemia in the setting of recurrent and persistent hematuria.  Assessment and Plan: * Symptomatic anemia- (present on admission) Secondary to chronic blood loss via hematuria.  Anemia panel shows significant iron deficiency. -Transfusing 1 unit PRBC -Obtain posttransfusion CBC -Consider IV iron transfusion prior to discharge -Hold Eliquis for now  Hematuria, undiagnosed cause with recurrent uti - (present on admission) Follows with [facility], Dr. Ky Barban.  S/p cystoscopy with right ureteroscopy 10/08/2021.  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  She was given IV ceftriaxone in ED for possible UTI/cystitis.  Urine culture currently  pending. -Follow urine culture -Follow-up with urology as an outpatient  Paroxysmal atrial fibrillation ([place])- (present on admission) In sinus rhythm at time of admission.  Holding Eliquis as above. Continue Tikosyn and diltiazem.  Continue magnesium and potassium supplementation.  Chronic diastolic CHF (congestive heart failure) ([place])- (present on admission) Stable and compensated on admission.  Continue home Lasix 80 mg daily with potassium and magnesium supplement.  COPD (chronic obstructive pulmonary disease) ([place])- (present on admission) Chronic respiratory failure with hypoxia: Stable on home 3 L O2 via [place].  Continue albuterol as needed.  Type 2 diabetes mellitus with diabetic polyneuropathy, with long-term current use of insulin ([place]) Continue reduced home Basaglar 20 units daily, add SSI.  Hold Januvia.  Hypertension associated with diabetes ([place])- (present on admission) Continue losartan, diltiazem, Lasix.  DVT prophylaxis: SCDs Start: 10/17/21 1947 Code Status: Full code, confirmed with patient on admission Family Communication: Discussed with patient, she has discussed with family Disposition Plan: From home and likely discharge to home pending clinical progress Consults called: None Severity of Illness: The appropriate patient status for this patient is INPATIENT. Inpatient status is judged to be reasonable and necessary in order to provide the required intensity of service to ensure the patient's safety. The patient's presenting symptoms, physical exam findings, and initial radiographic and laboratory data in the context of their chronic comorbidities is felt to place them at high risk for further clinical deterioration. Furthermore, it is not anticipated that the patient will be medically stable for discharge from the hospital within 2 midnights of admission.   * I certify that at the point of admission it is my clinical judgment that the patient will require inpatient  hospital care spanning beyond 2 midnights from the point of admission due to high intensity of service, high risk for further deterioration and high frequency of surveillance required.Zada Finders MD [facility]  If 7PM-7AM, please contact night-coverage [email or web address]  10/18/2021, 12:16 AM

## 2021-10-17 NOTE — ED Triage Notes (Signed)
Pt is here for "heart racing".  Pt reports hx of afib (on elaquis) and reports that she feels like her heart is racing.  She reports that she first noted this 2 days ago but it has gotten worse and she has sob with this and reports that she drops bleow 90% spo2 even on her 3L [place] which she always wears at home, pt has been taking her home medications as prescribed.

## 2021-10-17 NOTE — Care Plan (Signed)
Was called by ED doc for admission for patient Debbie [name], 76 year old female history of A. fib on chronic anticoagulation with Eliquis, ongoing hematuria being followed by urology in the outpatient setting with some intermittent hematuria recently seen by urology presented to the ED with complaints of palpitations shortness of breath on exertion.  Lab work obtained noted patient to have an anemia with a hemoglobin of 7.7.  EKG with a sinus tachycardia with PACs, basic metabolic profile with a glucose of 148, calcium of 8.7, magnesium of 1.9.  Urinalysis moderate leukocytes, nitrite negative, greater than 50 WBCs.  COVID-19 PCR negative.  ED requested admission for evaluation for symptomatic anemia and transfusion of packed red blood cells.  We will add urine cultures to be drawn and patient to be placed empirically on IV antibiotics of Rocephin pending urine cultures. Patient accepted to telemetry at [facility] to [facility].  No charge.

## 2021-10-18 DIAGNOSIS — D649 Anemia, unspecified: Secondary | ICD-10-CM

## 2021-10-18 LAB — BASIC METABOLIC PANEL
Anion gap: 5 (ref 5–15)
BUN: 9 mg/dL (ref 8–23)
CO2: 24 mmol/L (ref 22–32)
Calcium: 7.9 mg/dL — ABNORMAL LOW (ref 8.9–10.3)
Chloride: 107 mmol/L (ref 98–111)
Creatinine, Ser: 0.68 mg/dL (ref 0.44–1.00)
GFR, Estimated: >60 mL/min (ref >60)
Glucose, Bld: 139 mg/dL — ABNORMAL HIGH (ref 70–99)
Potassium: 4 mmol/L (ref 3.5–5.1)
Sodium: 136 mmol/L (ref 135–145)

## 2021-10-18 LAB — GLUCOSE, CAPILLARY
Glucose-Capillary: 135 mg/dL — ABNORMAL HIGH (ref 70–99)
Glucose-Capillary: 183 mg/dL — ABNORMAL HIGH (ref 70–99)
Glucose-Capillary: 219 mg/dL — ABNORMAL HIGH (ref 70–99)
Glucose-Capillary: 187 mg/dL — ABNORMAL HIGH (ref 70–99)
Glucose-Capillary: 190 mg/dL — ABNORMAL HIGH (ref 70–99)

## 2021-10-18 LAB — TYPE AND SCREEN
ABO/RH(D): O POS
Antibody Screen: NEGATIVE
Unit division: 0

## 2021-10-18 LAB — CBC
HCT: 27.4 % — ABNORMAL LOW (ref 36.0–46.0)
Hemoglobin: 8.4 g/dL — ABNORMAL LOW (ref 12.0–15.0)
MCH: 23.6 pg — ABNORMAL LOW (ref 26.0–34.0)
MCHC: 30.7 g/dL (ref 30.0–36.0)
MCV: 77 fL — ABNORMAL LOW (ref 80.0–100.0)
Platelets: 265 10*3/uL (ref 150–400)
RBC: 3.56 MIL/uL — ABNORMAL LOW (ref 3.87–5.11)
RDW: 18.5 % — ABNORMAL HIGH (ref 11.5–15.5)
WBC: 9.7 10*3/uL (ref 4.0–10.5)
nRBC: 0 % (ref 0.0–0.2)

## 2021-10-18 LAB — BPAM RBC
Blood Product Expiration Date: 202303072359
ISSUE DATE / TIME: 202302012254
Unit Type and Rh: 5100

## 2021-10-18 LAB — URINE CULTURE: Culture: <10000 — AB

## 2021-10-18 LAB — HEMOGLOBIN AND HEMATOCRIT, BLOOD
HCT: 31.3 % — ABNORMAL LOW (ref 36.0–46.0)
Hemoglobin: 9.5 g/dL — ABNORMAL LOW (ref 12.0–15.0)

## 2021-10-18 MED ORDER — INSULIN GLARGINE-YFGN 100 UNIT/ML ~~LOC~~ SOLN
25.0000 [IU] | Freq: Every day | SUBCUTANEOUS | Status: DC
  Administered 2021-10-18 – 2021-10-19 (×2): 25 [IU] via SUBCUTANEOUS
  Filled 2021-10-18 (×2): qty 0.25

## 2021-10-18 MED ORDER — BASAGLAR KWIKPEN 100 UNIT/ML ~~LOC~~ SOPN: 25.0000 [IU] | PEN_INJECTOR | Freq: Every day | SUBCUTANEOUS | Status: DC

## 2021-10-18 MED ORDER — ALBUTEROL SULFATE (2.5 MG/3ML) 0.083% IN NEBU: 2.5000 mg | INHALATION_SOLUTION | RESPIRATORY_TRACT | Status: DC | PRN

## 2021-10-18 MED ORDER — TIZANIDINE HCL 4 MG PO TABS
2.0000 mg | ORAL_TABLET | Freq: Once | ORAL | Status: AC
  Administered 2021-10-18: 2 mg via ORAL
  Filled 2021-10-18: qty 1

## 2021-10-18 NOTE — Assessment & Plan Note (Addendum)
Not in exacerbation.  BNP stable 53.4 with negative troponin x2.Continue home Lasix 80 mg daily with potassium and magnesium supplement.

## 2021-10-18 NOTE — Progress Notes (Signed)
PROGRESS NOTE    WEI NEWBROUGH  CVE:[id number] DOB: 11/30/1945 DOA: 10/17/2021 PCP: [name], MD   Brief Narrative/Hospital Course: Artis Delay, 76 y.o. female with medical history significant for paroxysmal atrial fibrillation on Eliquis, recurrent UTI with hematuria, chronic diastolic CHF (EF 02-58% 52/7782), COPD/hypoxic respiratory failure on 3 L of home O2, T2DM, HTN, HLD, s/p right BKA who is admitted with palpitation, shortness of breath and found to have symptomatic anemia with a hemoglobin of 7.7 g in the ED. Patient has history of recurrent UTI with hematuria.She says hematuria usually last 2-3 weeks after she has a UTI-due to recurrence of gross hematuria, she underwent cystoscopy with right ureteroscopy by her urologist,Dr. Peterson Lombard, with [facility] on 10/08/2021.  UA-showed negative nitrates, moderate leukocytes, >50 RBCs and WBC/hpf, few bacteria microscopy.Urine culture sent, cxr-increased interstitial changes without pleural effusion or pneumothorax. Patient was given IV ceftriaxone and started on maintenance fluids with half-normal saline.  1 unit PRBC ordered and admitted.      Subjective: Seen and examined this morning resting comfortably on the bedside chair daughter at the bedside  reports she had clot passed in urine, urine appearing lighter now, No new or other complaints  Assessment and Plan: * Symptomatic anemia- (present on admission) Due to acute on chronic blood loss anemia from hematuria. S/P 1 unit PRBC, hemoglobin increased appropriately, Eliquis on hold.  Iron supplementation upon discharge Recent Labs  Lab 10/17/21 1236 10/18/21 0419  HGB 7.7* 8.4*  HCT 26.1* 27.4*    Hematuria, undiagnosed cause with recurrent uti - (present on admission) Patient recurrent hematuria and UTI, followed by Dr. 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  UA with RBC and WBC >50, on empiric antibiotics for possible UTI/cystitis.  Has recurrent UTI.culture pending.  Continue to hold Eliquis for now.    Paroxysmal atrial fibrillation ([place])- (present on admission) Currently NSR, Eliquis on hold due to severe and symptomatic anemia.Continue Tikosyn and diltiazem.  Sent message Dr. [name] regarding Eliquis  Chronic diastolic CHF (congestive heart failure) ([place])- (present on admission) Not in exacerbation.  BNP stable 53.4 with negative troponin x2.Continue home Lasix 80 mg daily with potassium and magnesium supplement.  Chronic respiratory failure with hypoxia ([place])- (present on admission) Currently on home oxygen setting 3 L  Hypertension associated with diabetes ([place])- (present on admission) Well-controlled on home losartan, diltiazem, Lasix.  COPD (chronic obstructive pulmonary disease) ([place])- (present on admission) Chronic respiratory failure with hypoxia: COPD not in exacerbation.  Continue home oxygen 3 L, continue as needed inhalers.     Type 2 diabetes mellitus with diabetic polyneuropathy, with long-term current use of insulin ([place]) Sugar well controlled, continue reduced home Basaglar 20 units daily, add SSI.  Hold Januvia. Recent Labs  Lab 10/17/21 1542 10/17/21 2333 10/18/21 0522 10/18/21 0710  GLUCAP 161* 131* 135* 183*    DVT prophylaxis: SCDs Start: 10/17/21 1945-12-10 Code Status:   Code Status: Full Code Family Communication: plan of care discussed with patient/daughter at bedside.  Disposition: Currently not medically stable for discharge. Status is: Inpatient Remains inpatient appropriate because: Ongoing monitoring of hematuria and anemia Objective: Vitals last 24 hrs: Vitals:   10/17/21 2259 10/17/21 2322 10/18/21 0235 10/18/21 0635  BP: (!) 177/74 (!) 172/76 (!) 168/74 (!) 154/85  Pulse: 71 75 80 82  Resp: 18 18 16 16   Temp: 98 F (36.7 C) 97.9  F (36.6 C) 97.9 F  (36.6 C) 98.1 F (36.7 C)  TempSrc: Oral Oral Oral Oral  SpO2: 98% 98% 97% 98%   Weight change:   Physical Examination: General exam: AA0x3, weak,older than stated age. HEENT:Oral mucosa moist, Ear/Nose WNL grossly,dentition normal. Respiratory system: B/l clear BS, no use of accessory muscle, non tender. Cardiovascular system: S1 & S2 +,No JVD. Gastrointestinal system: Abdomen soft, NT,ND, BS+. Nervous System:Alert, awake, moving extremities. Extremities: edema none, distal peripheral pulses palpable.  Skin: No rashes, no icterus. MSK: Normal muscle bulk, tone, power.  Medications reviewed:  Scheduled Meds:  diltiazem  240 mg Oral Daily   dofetilide  250 mcg Oral BID   ezetimibe  10 mg Oral QPM   furosemide  80 mg Oral Daily   gabapentin  100 mg Oral QHS   gabapentin  200 mg Oral Daily   insulin aspart  0-9 Units Subcutaneous TID WC   insulin glargine-yfgn  25 Units Subcutaneous QAC breakfast   losartan  100 mg Oral Daily   magnesium oxide  400 mg Oral BID   pantoprazole  40 mg Oral Daily   potassium chloride SA  20 mEq Oral Once per day on Mon Wed Fri   sodium chloride flush  3 mL Intravenous Q12H   Continuous Infusions:  cefTRIAXone (ROCEPHIN)  IV        Diet Order             Diet heart healthy/carb modified Room service appropriate? Yes; Fluid consistency: Thin  Diet effective now                            Intake/Output Summary (Last 24 hours) at 10/18/2021 0954 Last data filed at 10/18/2021 0235 Gross per 24 hour  Intake 315 ml  Output --  Net 315 ml   Net IO Since Admission: 315 mL [10/18/21 0954]  Wt Readings from Last 3 Encounters:  10/02/21 93.4 kg  09/04/21 94.3 kg  08/19/21 90.7 kg     Unresulted Labs (From admission, onward)     Start     Ordered   10/17/21 1524  Urine Culture  Add-on,   AD       Question:  Indication  Answer:  Acute gross hematuria   10/17/21 1524          Data Reviewed: I have personally reviewed following  labs and imaging studies CBC: Recent Labs  Lab 10/17/21 1236 10/18/21 0419  WBC 11.7* 9.7  HGB 7.7* 8.4*  HCT 26.1* 27.4*  MCV 73.3* 77.0*  PLT 334 811   Basic Metabolic Panel: Recent Labs  Lab 10/17/21 1236 10/18/21 0419  NA 138 136  K 3.8 4.0  CL 105 107  CO2 23 24  GLUCOSE 148* 139*  BUN 10 9  CREATININE 0.86 0.68  CALCIUM 8.7* 7.9*  MG 1.9  --    GFR: CrCl cannot be calculated (Unknown ideal weight.). Liver Function Tests: No results for input(s): AST, ALT, ALKPHOS, BILITOT, PROT, ALBUMIN in the last 168 hours. No results for input(s): LIPASE, AMYLASE in the last 168 hours. No results for input(s): AMMONIA in the last 168 hours. Coagulation Profile: No results for input(s): INR, PROTIME in the last 168 hours. Cardiac Enzymes: No results for input(s): CKTOTAL, CKMB, CKMBINDEX, TROPONINI in the last 168 hours. BNP (last 3 results) No results for input(s): PROBNP in the last 8760 hours. HbA1C: No results for input(s): HGBA1C in the last  72 hours. CBG: Recent Labs  Lab 10/17/21 1542 10/17/21 2333 10/18/21 0522 10/18/21 0710  GLUCAP 161* 131* 135* 183*   Lipid Profile: No results for input(s): CHOL, HDL, LDLCALC, TRIG, CHOLHDL, LDLDIRECT in the last 72 hours. Thyroid Function Tests: No results for input(s): TSH, T4TOTAL, FREET4, T3FREE, THYROIDAB in the last 72 hours. Anemia Panel: Recent Labs    10/17/21 1559  VITAMINB12 478  FOLATE 33.5  FERRITIN 3*  TIBC 445  IRON 16*  RETICCTPCT 1.7   Sepsis Labs: No results for input(s): PROCALCITON, LATICACIDVEN in the last 168 hours.  Recent Results (from the past 240 hour(s))  Resp Panel by RT-PCR (Flu A&B, Covid) Nasopharyngeal Swab     Status: None   Collection Time: 10/17/21 12:36 PM   Specimen: Nasopharyngeal Swab; Nasopharyngeal(NP) swabs in vial transport medium  Result Value Ref Range Status   SARS Coronavirus 2 by RT PCR NEGATIVE NEGATIVE Final    Comment: (NOTE) SARS-CoV-2 target nucleic acids  are NOT DETECTED.  The SARS-CoV-2 RNA is generally detectable in upper respiratory specimens during the acute phase of infection. The lowest concentration of SARS-CoV-2 viral copies this assay can detect is 138 copies/mL. A negative result does not preclude SARS-Cov-2 infection and should not be used as the sole basis for treatment or other patient management decisions. A negative result may occur with  improper specimen collection/handling, submission of specimen other than nasopharyngeal swab, presence of viral mutation(s) within the areas targeted by this assay, and inadequate number of viral copies(<138 copies/mL). A negative result must be combined with clinical observations, patient history, and epidemiological information. The expected result is Negative.  Fact Sheet for Patients:  EntrepreneurPulse.com.au  Fact Sheet for Healthcare Providers:  IncredibleEmployment.be  This test is no t yet approved or cleared by the [place] FDA and  has been authorized for detection and/or diagnosis of SARS-CoV-2 by FDA under an Emergency Use Authorization (EUA). This EUA will remain  in effect (meaning this test can be used) for the duration of the COVID-19 declaration under Section 564(b)(1) of the Act, 21 U.S.C.section 360bbb-3(b)(1), unless the authorization is terminated  or revoked sooner.       Influenza A by PCR NEGATIVE NEGATIVE Final   Influenza B by PCR NEGATIVE NEGATIVE Final    Comment: (NOTE) The Xpert Xpress SARS-CoV-2/FLU/RSV plus assay is intended as an aid in the diagnosis of influenza from Nasopharyngeal swab specimens and should not be used as a sole basis for treatment. Nasal washings and aspirates are unacceptable for Xpert Xpress SARS-CoV-2/FLU/RSV testing.  Fact Sheet for Patients: EntrepreneurPulse.com.au  Fact Sheet for Healthcare Providers: IncredibleEmployment.be  This test is  not yet approved or cleared by the [place] FDA and has been authorized for detection and/or diagnosis of SARS-CoV-2 by FDA under an Emergency Use Authorization (EUA). This EUA will remain in effect (meaning this test can be used) for the duration of the COVID-19 declaration under Section 564(b)(1) of the Act, 21 U.S.C. section 360bbb-3(b)(1), unless the authorization is terminated or revoked.  Performed at [facility], [address], [place], [place] [address]     Antimicrobials: Anti-infectives (From admission, onward)    Start     Dose/Rate Route Frequency Ordered Stop   10/18/21 1500  cefTRIAXone (ROCEPHIN) 1 g in sodium chloride 0.9 % 100 mL IVPB        1 g 200 mL/hr over 30 Minutes Intravenous Every 24 hours 10/17/21 2158     10/17/21 1515  cefTRIAXone (ROCEPHIN) 1  g in sodium chloride 0.9 % 100 mL IVPB        1 g 200 mL/hr over 30 Minutes Intravenous  Once 10/17/21 1510 10/17/21 1707      Culture/Microbiology    Component Value Date/Time   SDES  03/04/2021 0036    URINE, RANDOM Performed at [facility], [address] [place], [place] [address]    [facility]  03/04/2021 0036    NONE Performed at [facility], [address], [place], [place] [address]    CULT >=100,000 COLONIES/mL ESCHERICHIA COLI (A) 03/04/2021 0036   REPTSTATUS 03/06/2021 FINAL 03/04/2021 0036    Other culture-see note   Radiology Studies: DG Chest Port 1 View  Result Date: 10/17/2021 CLINICAL DATA:  Palpitations EXAM: PORTABLE CHEST 1 VIEW COMPARISON:  08/16/2021 FINDINGS: Increased interstitial changes. No pleural effusion or pneumothorax. Similar cardiomediastinal contours. IMPRESSION: Increased interstitial changes may reflect mild edema superimposed on chronic findings. Electronically Signed   By: Macy Mis M.D.   On: 10/17/2021 13:46     LOS: 1 day   Antonieta Pert, MD [facility]  10/18/2021, 9:54 AM

## 2021-10-18 NOTE — Assessment & Plan Note (Addendum)
Currently NSR, Eliquis on hold due to severe and symptomatic anemia.Continue Tikosyn and diltiazem.  Sent message Dr. [name] regarding Eliquis and discussed he agrees with holding x5 days. Discussed with the patient's-agrees with recommendation accepting risk benefits

## 2021-10-18 NOTE — Assessment & Plan Note (Addendum)
Due to acute on chronic blood loss anemia from hematuria. S/P 1 unit PRBC, hemoglobin holding steady in 8.6-Eliquis on hold.  Iron supplementation as OP Recent Labs  Lab 10/17/21 1236 10/18/21 0419 10/18/21 1858 10/19/21 0356  HGB 7.7* 8.4* 9.5* 8.8*  HCT 26.1* 27.4* 31.3* 28.8*

## 2021-10-18 NOTE — Assessment & Plan Note (Signed)
Currently on home oxygen setting 3 L

## 2021-10-18 NOTE — Assessment & Plan Note (Addendum)
Sugar well controlled, continue home insulin upon discharge and po meds. Recent Labs  Lab 10/18/21 0710 10/18/21 1125 10/18/21 1659 10/18/21 2210 10/19/21 0729  GLUCAP 183* 219* 187* 190* 142*

## 2021-10-18 NOTE — Assessment & Plan Note (Addendum)
Patient recurrent hematuria and UTI, followed by Dr. 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  UA with RBC and WBC >50, on empiric antibiotics for possible UTI/cystitis-culture negative.  Complete 2 more days of antibiotics.  Hematuria at this time is improved, continue to hold Eliquis follow-up with urology as outpatient

## 2021-10-18 NOTE — Assessment & Plan Note (Addendum)
Well-controlled on home losartan, diltiazem, Lasix.

## 2021-10-18 NOTE — Assessment & Plan Note (Addendum)
Chronic respiratory failure with hypoxia: COPD not in exacerbation.  Continue home oxygen 3 L, continue as needed inhalers.

## 2021-10-19 LAB — CBC
HCT: 28.8 % — ABNORMAL LOW (ref 36.0–46.0)
Hemoglobin: 8.8 g/dL — ABNORMAL LOW (ref 12.0–15.0)
MCH: 23.7 pg — ABNORMAL LOW (ref 26.0–34.0)
MCHC: 30.6 g/dL (ref 30.0–36.0)
MCV: 77.6 fL — ABNORMAL LOW (ref 80.0–100.0)
Platelets: 294 10*3/uL (ref 150–400)
RBC: 3.71 MIL/uL — ABNORMAL LOW (ref 3.87–5.11)
RDW: 18.6 % — ABNORMAL HIGH (ref 11.5–15.5)
WBC: 11 10*3/uL — ABNORMAL HIGH (ref 4.0–10.5)
nRBC: 0 % (ref 0.0–0.2)

## 2021-10-19 LAB — GLUCOSE, CAPILLARY: Glucose-Capillary: 142 mg/dL — ABNORMAL HIGH (ref 70–99)

## 2021-10-19 MED ORDER — CEFDINIR 300 MG PO CAPS: 300.0000 mg | ORAL_CAPSULE | Freq: Two times a day (BID) | ORAL | 0 refills | Status: AC

## 2021-10-19 MED ORDER — APIXABAN 5 MG PO TABS: 5.0000 mg | ORAL_TABLET | Freq: Two times a day (BID) | ORAL | 0 refills | Status: DC

## 2021-10-19 NOTE — Progress Notes (Signed)
Patient discharged home via family transport, using personal portable oxygen. AVS summary reviewed and given to patient.

## 2021-10-19 NOTE — Discharge Summary (Signed)
Physician Discharge Summary   Patient: Debbie [name] MRN: [id number] DOB: 08-05-1946  Admit date:     10/17/2021  Discharge date: 10/19/21  Discharge Physician: Antonieta Pert   PCP: [name], MD   Recommendations at discharge:   Follow-up with urology regarding biopsy report  Discharge Diagnoses: Principal Problem:   Symptomatic anemia Active Problems:   Hematuria, undiagnosed cause with recurrent uti    Paroxysmal atrial fibrillation (HCC)   Type 2 diabetes mellitus with diabetic polyneuropathy, with long-term current use of insulin (HCC)   COPD (chronic obstructive pulmonary disease) (HCC)   Hypertension associated with diabetes ([place])   Chronic respiratory failure with hypoxia (HCC)   Chronic diastolic CHF (congestive heart failure) ([place])  Resolved Problems:   * No resolved hospital problems. *   Hospital Course: 76 yo F with medical history significant for paroxysmal atrial fibrillation on Eliquis, recurrent UTI with hematuria, chronic diastolic CHF (EF 25-42% 70/6237), COPD/hypoxic respiratory failure on 3 L of home O2, T2DM, HTN, HLD, s/p right BKA who is admitted with palpitation, shortness of breath and found to have symptomatic anemia with a hemoglobin of 7.7 g in the ED. Patient has history of recurrent UTI with hematuria.She says hematuria usually last 2-3 weeks after she has a UTI-due to recurrence of gross hematuria, she underwent cystoscopy with right ureteroscopy by her urologist,Dr. Peterson [place]ard, with [facility] on 10/08/2021.  UA-showed negative nitrates, moderate leukocytes, >50 RBCs and WBC/hpf, few bacteria microscopy.Urine culture sent, cxr-increased interstitial changes without pleural effusion or pneumothorax. Patient was given IV ceftriaxone and started on maintenance fluids with half-normal saline.  1 unit PRBC ordered and admitted. Hemoglobin has improved, at this time hematuria has resolved after holding Eliquis.  Discussed with Dr. [name] from  cardiology agrees for holding for next few more days patient may resume back on Monday.  Complete antibiotics 2 more days  At this time she is stable for discharge home  Assessment and Plan: * Symptomatic anemia- (present on admission) Due to acute on chronic blood loss anemia from hematuria. S/P 1 unit PRBC, hemoglobin holding steady in 8.6-Eliquis on hold.  Iron supplementation as OP Recent Labs  Lab 10/17/21 1236 10/18/21 0419 10/18/21 1858 10/19/21 0356  HGB 7.7* 8.4* 9.5* 8.8*  HCT 26.1* 27.4* 31.3* 28.8*    Hematuria, undiagnosed cause with recurrent uti - (present on admission) Patient recurrent hematuria and UTI, followed by Dr. 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  UA with RBC and WBC >50, on empiric antibiotics for possible UTI/cystitis-culture negative.  Complete 2 more days of antibiotics.  Hematuria at this time is improved, continue to hold Eliquis follow-up with urology as outpatient   Paroxysmal atrial fibrillation ([place])- (present on admission) Currently NSR, Eliquis on hold due to severe and symptomatic anemia.Continue Tikosyn and diltiazem.  Sent message Dr. [name] regarding Eliquis and discussed he agrees with holding x5 days. Discussed with the patient's-agrees with recommendation accepting risk benefits  Chronic diastolic CHF (congestive heart failure) ([place])- (present on admission) Not in exacerbation.  BNP stable 53.4 with negative troponin x2.Continue home Lasix 80 mg daily with potassium and magnesium supplement.  Chronic respiratory failure with hypoxia ([place])- (present on admission) Currently on home oxygen setting 3 L  Hypertension associated with diabetes ([place])- (present on admission) Well-controlled on home losartan, diltiazem, Lasix.  COPD (chronic obstructive pulmonary disease) ([place])- (present on  admission) Chronic respiratory  failure with hypoxia: COPD not in exacerbation.  Continue home oxygen 3 L, continue as needed inhalers.     Type 2 diabetes mellitus with diabetic polyneuropathy, with long-term current use of insulin ([place]) Sugar well controlled, continue home insulin upon discharge and po meds. Recent Labs  Lab 10/18/21 0710 10/18/21 1125 10/18/21 1659 10/18/21 2210 10/19/21 0729  GLUCAP 183* 219* 187* 190* 142*       Consultants: Cardiology over the phone Procedures performed: None Disposition: Home Diet recommendation:  Diet Orders (From admission, onward)     Start     Ordered   10/17/21 1916  Diet heart healthy/carb modified Room service appropriate? Yes; Fluid consistency: Thin  Diet effective now       Question Answer Comment  Diet-HS Snack? Nothing   Room service appropriate? Yes   Fluid consistency: Thin      10/17/21 1916             DISCHARGE MEDICATION: Allergies as of 10/19/2021       Reactions   Meloxicam Other (See Comments)   Causes excess Fluid buildup        Medication List     TAKE these medications    acetaminophen 500 MG tablet Commonly known as: TYLENOL Take 500 mg by mouth every 6 (six) hours as needed for mild pain (or headaches).   apixaban 5 MG Tabs tablet Commonly known as: ELIQUIS Take 1 tablet (5 mg total) by mouth 2 (two) times daily. Start taking on: October 22, 2021 What changed: These instructions start on October 22, 2021. If you are unsure what to do until then, ask your doctor or other care provider.   Basaglar KwikPen 100 UNIT/ML Inject 45 Units into the skin daily before breakfast.   BD Pen Needle Nano U/F 32G X 4 MM Misc Generic drug: Insulin Pen Needle 2 (two) times daily. as directed   Insulin Pen Needle 32G X 4 MM Misc USE TWICE DAILY AS DIRECTED   CALCIUM 1000 + D PO Take 1,000 mg by mouth daily.   cefdinir 300 MG capsule Commonly known as: OMNICEF Take 1 capsule (300 mg total) by  mouth 2 (two) times daily for 2 days.   Coenzyme Q10 400 MG Caps Take 400 mg by mouth daily.   dexlansoprazole 60 MG capsule Commonly known as: Dexilant Take 1 capsule (60 mg total) by mouth daily.   diltiazem 240 MG 24 hr capsule Commonly known as: CARDIZEM CD Take 1 capsule (240 mg total) by mouth daily.   dofetilide 250 MCG capsule Commonly known as: TIKOSYN TAKE 1 CAPSULE BY MOUTH TWICE A DAY   estradiol 0.1 MG/GM vaginal cream Commonly known as: ESTRACE Place 1 Applicatorful vaginally 3 (three) times a week.   ezetimibe 10 MG tablet Commonly known as: ZETIA Take 10 mg by mouth every evening.   furosemide 40 MG tablet Commonly known as: Lasix Take 2 tablets (80 mg total) by mouth daily.   gabapentin 100 MG capsule Commonly known as: NEURONTIN Take 300 mg by mouth daily. Take 200 mg in the morning and 100 mg at night   glucosamine-chondroitin 500-400 MG tablet Take 1 tablet by mouth every morning.   halobetasol 0.05 % cream Commonly known as: ULTRAVATE Apply 1 application topically 2 (two) times daily as needed (psoriasis).   ibandronate 150 MG tablet Commonly known as: BONIVA Take 150 mg by mouth every 30 (thirty) days.   Januvia 100 MG tablet Generic drug: sitaGLIPtin Take 100 mg by mouth  daily.   ketotifen 0.025 % ophthalmic solution Commonly known as: ZADITOR Place 1 drop into both eyes daily as needed (for irritation).   losartan 100 MG tablet Commonly known as: COZAAR TAKE 1 TABLET BY MOUTH EVERY DAY   magnesium oxide 400 (241.3 Mg) MG tablet Commonly known as: MAG-OX Take 1 tablet (400 mg total) by mouth 2 (two) times daily. Please make yearly appt with Dr. [name] for April 2022 for future refills. Thank you 1st attempt   methenamine 1 g tablet Commonly known as: HIPREX Take 1 g by mouth 2 (two) times daily.   ondansetron 4 MG disintegrating tablet Commonly known as: ZOFRAN-ODT Take 4 mg by mouth every 8 (eight) hours as needed for nausea or  vomiting.   OXYGEN Inhale 3 L/min into the lungs continuous.   potassium chloride SA 20 MEQ tablet Commonly known as: KLOR-CON M Take 1 tablet (20 mEq total) by mouth 3 (three) times a week.   PREPARATION H EX Place 1 application rectally 2 (two) times daily as needed (for pain).   tiZANidine 4 MG tablet Commonly known as: ZANAFLEX Take 4 mg by mouth at bedtime.   Voltaren 1 % Gel Generic drug: diclofenac Sodium Apply 2 g topically 4 (four) times daily as needed (for pain).        Follow-up Information     [name], MD Follow up in 1 week(s).   Specialty: Family Medicine Contact information: [place] [address] [phone]         Evans Lance, MD .   Specialty: Cardiology Contact information: [phone] [address] [address] [phone]         Sherren Mocha, MD .   Specialty: Cardiology Contact information: [phone] [address] [address] [phone]                 Discharge Exam: Danley Danker Weights   10/19/21 0351  Weight: 91.2 kg  Condition at discharge: Stable  The results of significant diagnostics from this hospitalization (including imaging, microbiology, ancillary and laboratory) are listed below for reference.   Imaging Studies: DG Chest Port 1 View  Result Date: 10/17/2021 CLINICAL DATA:  Palpitations EXAM: PORTABLE CHEST 1 VIEW COMPARISON:  08/16/2021 FINDINGS: Increased interstitial changes. No pleural effusion or pneumothorax. Similar cardiomediastinal contours. IMPRESSION: Increased interstitial changes may reflect mild edema superimposed on chronic findings. Electronically Signed   By: Macy Mis M.D.   On: 10/17/2021 13:46    Microbiology: Results for orders placed or performed during the hospital encounter of 10/17/21  Resp Panel by RT-PCR (Flu A&B, Covid) Nasopharyngeal Swab     Status: None   Collection Time: 10/17/21 12:36 PM   Specimen:  Nasopharyngeal Swab; Nasopharyngeal(NP) swabs in vial transport medium  Result Value Ref Range Status   SARS Coronavirus 2 by RT PCR NEGATIVE NEGATIVE Final    Comment: (NOTE) SARS-CoV-2 target nucleic acids are NOT DETECTED.  The SARS-CoV-2 RNA is generally detectable in upper respiratory specimens during the acute phase of infection. The lowest concentration of SARS-CoV-2 viral copies this assay can detect is 138 copies/mL. A negative result does not preclude SARS-Cov-2 infection and should not be used as the sole basis for treatment or other patient management decisions. A negative result may occur with  improper specimen collection/handling, submission of specimen other than nasopharyngeal swab, presence of viral mutation(s) within the areas targeted by this assay, and inadequate number of viral copies(<138 copies/mL).  A negative result must be combined with clinical observations, patient history, and epidemiological information. The expected result is Negative.  Fact Sheet for Patients:  EntrepreneurPulse.com.au  Fact Sheet for Healthcare Providers:  IncredibleEmployment.be  This test is no t yet approved or cleared by the [place] FDA and  has been authorized for detection and/or diagnosis of SARS-CoV-2 by FDA under an Emergency Use Authorization (EUA). This EUA will remain  in effect (meaning this test can be used) for the duration of the COVID-19 declaration under Section 564(b)(1) of the Act, 21 U.S.C.section 360bbb-3(b)(1), unless the authorization is terminated  or revoked sooner.       Influenza A by PCR NEGATIVE NEGATIVE Final   Influenza B by PCR NEGATIVE NEGATIVE Final    Comment: (NOTE) The Xpert Xpress SARS-CoV-2/FLU/RSV plus assay is intended as an aid in the diagnosis of influenza from Nasopharyngeal swab specimens and should not be used as a sole basis for treatment. Nasal washings and aspirates are unacceptable for  Xpert Xpress SARS-CoV-2/FLU/RSV testing.  Fact Sheet for Patients: EntrepreneurPulse.com.au  Fact Sheet for Healthcare Providers: IncredibleEmployment.be  This test is not yet approved or cleared by the [place] FDA and has been authorized for detection and/or diagnosis of SARS-CoV-2 by FDA under an Emergency Use Authorization (EUA). This EUA will remain in effect (meaning this test can be used) for the duration of the COVID-19 declaration under Section 564(b)(1) of the Act, 21 U.S.C. section 360bbb-3(b)(1), unless the authorization is terminated or revoked.  Performed at [facility], [address], [place], [place] [address]   Urine Culture     Status: Abnormal   Collection Time: 10/17/21  3:24 PM   Specimen: Urine, Clean Catch  Result Value Ref Range Status   Specimen Description   Final    URINE, CLEAN CATCH Performed at [facility], [address], [place], [place] [address]    Special Requests   Final    NONE Performed at [facility], [address] [place], [place] [address]    Culture (A)  Final    <10,000 COLONIES/mL INSIGNIFICANT GROWTH Performed at [facility] [address], [place], [place] [address]    Report Status 10/18/2021 FINAL  Final    Labs: CBC: Recent Labs  Lab 10/17/21 1236 10/18/21 0419 10/18/21 1858 10/19/21 0356  WBC 11.7* 9.7  --  11.0*  HGB 7.7* 8.4* 9.5* 8.8*  HCT 26.1* 27.4* 31.3* 28.8*  MCV 73.3* 77.0*  --  77.6*  PLT 334 265  --  372   Basic Metabolic Panel: Recent Labs  Lab 10/17/21 1236 10/18/21 0419  NA 138 136  K 3.8 4.0  CL 105 107  CO2 23 24  GLUCOSE 148* 139*  BUN 10 9  CREATININE 0.86 0.68  CALCIUM 8.7* 7.9*  MG 1.9  --    Liver Function Tests: No results for input(s): AST, ALT, ALKPHOS, BILITOT, PROT, ALBUMIN in the last 168 hours. CBG: Recent Labs  Lab 10/18/21 0710 10/18/21 1125  10/18/21 1659 10/18/21 2210 10/19/21 0729  GLUCAP 183* 219* 187* 190* 142*    Discharge time spent: 35 minutes.  Signed: Antonieta Pert, MD [facility] 10/19/2021

## 2021-10-19 NOTE — Plan of Care (Signed)
°  Problem: Education: Goal: Knowledge of General Education information will improve Description: Including pain rating scale, medication(s)/side effects and non-pharmacologic comfort measures 10/19/2021 1040 by Cassell Smiles, RN Outcome: Adequate for Discharge 10/19/2021 1039 by Cassell Smiles, RN Outcome: Adequate for Discharge   Problem: Clinical Measurements: Goal: Ability to maintain clinical measurements within normal limits will improve 10/19/2021 1040 by Cassell Smiles, RN Outcome: Adequate for Discharge 10/19/2021 1039 by Cassell Smiles, RN Outcome: Adequate for Discharge Goal: Will remain free from infection 10/19/2021 1040 by Cassell Smiles, RN Outcome: Adequate for Discharge 10/19/2021 1039 by Cassell Smiles, RN Outcome: Adequate for Discharge Goal: Diagnostic test results will improve 10/19/2021 1040 by Cassell Smiles, RN Outcome: Adequate for Discharge 10/19/2021 1039 by Cassell Smiles, RN Outcome: Adequate for Discharge Goal: Respiratory complications will improve 10/19/2021 1040 by Cassell Smiles, RN Outcome: Adequate for Discharge 10/19/2021 1039 by Cassell Smiles, RN Outcome: Adequate for Discharge Goal: Cardiovascular complication will be avoided 10/19/2021 1040 by Cassell Smiles, RN Outcome: Adequate for Discharge 10/19/2021 1039 by Cassell Smiles, RN Outcome: Adequate for Discharge   Problem: Activity: Goal: Risk for activity intolerance will decrease 10/19/2021 1040 by Cassell Smiles, RN Outcome: Adequate for Discharge 10/19/2021 1039 by Cassell Smiles, RN Outcome: Adequate for Discharge   Problem: Nutrition: Goal: Adequate nutrition will be maintained 10/19/2021 1040 by Cassell Smiles, RN Outcome: Adequate for Discharge 10/19/2021 1039 by Cassell Smiles, RN Outcome: Adequate for Discharge   Problem: Coping: Goal: Level of anxiety will decrease 10/19/2021 1040 by Cassell Smiles, RN Outcome: Adequate for Discharge 10/19/2021 1039 by Cassell Smiles, RN Outcome: Adequate for Discharge   Problem: Elimination: Goal: Will not experience complications related to bowel motility 10/19/2021 1040 by Cassell Smiles, RN Outcome: Adequate for Discharge 10/19/2021 1039 by Cassell Smiles, RN Outcome: Adequate for Discharge Goal: Will not experience complications related to urinary retention 10/19/2021 1040 by Cassell Smiles, RN Outcome: Adequate for Discharge 10/19/2021 1039 by Cassell Smiles, RN Outcome: Adequate for Discharge   Problem: Pain Managment: Goal: General experience of comfort will improve 10/19/2021 1040 by Cassell Smiles, RN Outcome: Adequate for Discharge 10/19/2021 1039 by Cassell Smiles, RN Outcome: Adequate for Discharge   Problem: Safety: Goal: Ability to remain free from injury will improve 10/19/2021 1040 by Cassell Smiles, RN Outcome: Adequate for Discharge 10/19/2021 1039 by Cassell Smiles, RN Outcome: Adequate for Discharge   Problem: Skin Integrity: Goal: Risk for impaired skin integrity will decrease 10/19/2021 1040 by Cassell Smiles, RN Outcome: Adequate for Discharge 10/19/2021 1039 by Cassell Smiles, RN Outcome: Adequate for Discharge

## 2021-10-19 NOTE — Progress Notes (Signed)
°  Transition of Care [facility]) Screening Note   Patient Details  Name: Debbie [name] Date of Birth: Feb 07, 1946   Transition of Care [facility]) CM/SW Contact:    Dessa Phi, RN Phone Number: 10/19/2021, 10:51 AM    Transition of Care Department [facility]) has reviewed patient and no TOC needs have been identified at this time. We will continue to monitor patient advancement through interdisciplinary progression rounds. If new patient transition needs arise, please place a TOC consult.

## 2021-11-15 ENCOUNTER — Encounter: Payer: Self-pay | Admitting: Student

## 2021-11-15 ENCOUNTER — Ambulatory Visit (INDEPENDENT_AMBULATORY_CARE_PROVIDER_SITE_OTHER): Payer: Medicare Other | Admitting: Student

## 2021-11-15 ENCOUNTER — Other Ambulatory Visit: Payer: Self-pay

## 2021-11-15 VITALS — BP 120/64 | HR 89 | Ht 68.0 in | Wt 203.0 lb

## 2021-11-15 DIAGNOSIS — J449 Chronic obstructive pulmonary disease, unspecified: Secondary | ICD-10-CM | POA: Diagnosis not present

## 2021-11-15 DIAGNOSIS — I4891 Unspecified atrial fibrillation: Secondary | ICD-10-CM | POA: Diagnosis not present

## 2021-11-15 DIAGNOSIS — R319 Hematuria, unspecified: Secondary | ICD-10-CM

## 2021-11-15 DIAGNOSIS — I10 Essential (primary) hypertension: Secondary | ICD-10-CM

## 2021-11-15 NOTE — Patient Instructions (Signed)
Medication Instructions:  ?Your physician has recommended you make the following change in your medication:  ? ?DISCONTINUE: Eliquis ? ?*If you need a refill on your cardiac medications before your next appointment, please call your pharmacy* ? ? ?Lab Work: ?None ?If you have labs (blood work) drawn today and your tests are completely normal, you will receive your results only by: ?MyChart Message (if you have MyChart) OR ?A paper copy in the mail ?If you have any lab test that is abnormal or we need to change your treatment, we will call you to review the results. ? ? ?Follow-Up: ?At [facility], you and your health needs are our priority.  As part of our continuing mission to provide you with exceptional heart care, we have created designated Provider Care Teams.  These Care Teams include your primary Cardiologist (physician) and Advanced Practice Providers (APPs -  Physician Assistants and Nurse Practitioners) who all work together to provide you with the care you need, when you need it. ? ?Your next appointment:   ?4 month(s) ? ?The format for your next appointment:   ?In Person ? ?Provider:   ?Cristopher Peru, MD{ ?

## 2021-11-15 NOTE — Progress Notes (Signed)
PCP:  [name], MD Primary Cardiologist: Sherren Mocha, MD Electrophysiologist: Cristopher Peru, MD   Debbie [name] is a 76 y.o. female seen today for Cristopher Peru, MD for acute visit due to hematuria .  Pt with history of intermittent hematuria. Recently worsened leading to anemia and requiring transfusion.  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 They would like to hold eliquis for an indefinite time frame so that this can be definitively managed.     Since last being seen in our clinic the patient reports doing OK. As above has recurrent, intermittent hematuria with worsening severity. She felt like she had one, short isolated episode of afib, but otherwise no recurrence on tikosyn. she denies chest pain, dyspnea, PND, orthopnea, nausea, vomiting, dizziness, syncope, edema, weight gain, or early satiety. On chronic O2.  Past Medical History:  Diagnosis Date   A-fib ([place]) 11/15/2019   Abnormal liver function    Adenomatous polyp 12/04/2006   AKI (acute kidney injury) ([place])    Asthma    DM type 2 (diabetes mellitus, type 2) ([place])    GERD (gastroesophageal reflux disease) 02/28/2012   Hemorrhoid 12/04/2006   Hyperlipidemia    Hypertension    Hypomagnesemia    Hypotension 09/21/2019   Peripheral arterial disease ([place])    Persistent atrial fibrillation ([place]) 11/15/2019   Pulmonary hypertension ([place]) 03/28/2017   Echo 04/04/17 Compared to a prior study in 2015,   there is now moderate LVH and the LVEF is higher at 65-70%. No   obvious PFO noted by saline microbubble contrast. There is   moderate TR with an RVSP of 56 mmHg and a normal, collapsing IVC.   Consistent with moderate pulmonary hypertension.   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 Delight Ovens, adenomatous polyp   COLONOSCOPY  03/25/2012   Procedure: COLONOSCOPY;  Surgeon: Daneil Dolin, MD;  Location: [facility] ENDO SUITE;  Service: Endoscopy;  Laterality: N/A;  10:30   ESOPHAGOGASTRODUODENOSCOPY  11/03/2002   Dr. Gala Romney- normal exam- was done to check for possible foreign body   Fiberoptic bronchoscopy with endobronchial  ultrasound  10/22/2010   [place]   Right BKA  1990   RIGHT HEART CATHETERIZATION N/A 06/22/2014   Procedure: RIGHT HEART CATH;  Surgeon: Larey Dresser, MD;  Location: [facility] CATH LAB;  Service: Cardiovascular;  Laterality: N/A;    Current Outpatient Medications  Medication Sig Dispense Refill   acetaminophen (TYLENOL) 500 MG tablet Take 500 mg by mouth every 6 (six) hours as needed for mild pain (or headaches).     apixaban (ELIQUIS) 5 MG TABS tablet Take 1 tablet (5 mg total) by mouth 2 (two) times daily. 1 tablet 0   BD PEN NEEDLE NANO U/F 32G X 4 MM MISC 2 (two) times daily. as directed  6   Calcium Carb-Cholecalciferol (CALCIUM 1000 + D PO) Take 1,000 mg by mouth daily.     Coenzyme Q10 400 MG CAPS Take 400 mg by mouth daily.      dexlansoprazole (DEXILANT)  60 MG capsule Take 1 capsule (60 mg total) by mouth daily.     diltiazem (CARDIZEM CD) 240 MG 24 hr capsule Take 1 capsule (240 mg total) by mouth daily. 30 capsule 0   dofetilide (TIKOSYN) 250 MCG capsule TAKE 1 CAPSULE BY MOUTH TWICE A DAY (Patient taking differently: Take 250 mcg by mouth 2 (two) times daily.) 180 capsule 3   estradiol (ESTRACE) 0.1 MG/GM vaginal cream Place 1 Applicatorful vaginally 3 (three) times a week.     ezetimibe (ZETIA) 10 MG tablet Take 10 mg by mouth every evening.      furosemide (LASIX) 40 MG tablet Take 2 tablets (80 mg total) by mouth daily. 180 tablet 3   gabapentin (NEURONTIN) 100 MG  capsule Take 300 mg by mouth daily. Take 200 mg in the morning and 100 mg at night     glucosamine-chondroitin 500-400 MG tablet Take 1 tablet by mouth every morning.     halobetasol (ULTRAVATE) 0.05 % cream Apply 1 application topically 2 (two) times daily as needed (psoriasis).      ibandronate (BONIVA) 150 MG tablet Take 150 mg by mouth every 30 (thirty) days.     Insulin Glargine (BASAGLAR KWIKPEN) 100 UNIT/ML SOPN Inject 45 Units into the skin daily before breakfast.     Insulin Pen Needle 32G X 4 MM MISC USE TWICE DAILY AS DIRECTED     JANUVIA 100 MG tablet Take 100 mg by mouth daily.     ketotifen (ZADITOR) 0.025 % ophthalmic solution Place 1 drop into both eyes daily as needed (for irritation).     Lidocaine-Glycerin (PREPARATION H EX) Place 1 application rectally 2 (two) times daily as needed (for pain).     losartan (COZAAR) 100 MG tablet TAKE 1 TABLET BY MOUTH EVERY DAY (Patient taking differently: Take 100 mg by mouth daily.) 90 tablet 3   magnesium oxide (MAG-OX) 400 (241.3 Mg) MG tablet Take 1 tablet (400 mg total) by mouth 2 (two) times daily. Please make yearly appt with Dr. [name] for April 2022 for future refills. Thank you 1st attempt 60 tablet 2   methenamine (HIPREX) 1 g tablet Take 1 g by mouth 2 (two) times daily.     ondansetron (ZOFRAN-ODT) 4 MG disintegrating tablet Take 4 mg by mouth every 8 (eight) hours as needed for nausea or vomiting.     OXYGEN Inhale 3 L/min into the lungs continuous.     potassium chloride SA (KLOR-CON M) 20 MEQ tablet Take 1 tablet (20 mEq total) by mouth 3 (three) times a week. 30 tablet 0   tiZANidine (ZANAFLEX) 4 MG tablet Take 4 mg by mouth at bedtime.     VOLTAREN 1 % GEL Apply 2 g topically 4 (four) times daily as needed (for pain).  2   No current facility-administered medications for this visit.    Allergies  Allergen Reactions   Meloxicam Other (See Comments)    Causes excess Fluid buildup    Social History   Socioeconomic  History   Marital status: Married    Spouse name: Engineer, water   Number of children: 3   Years of education: Not on file   Highest education level: Not on file  Occupational History   Occupation: retired    Fish farm manager: UNEMPLOYED  Tobacco Use   Smoking status: Former    Packs/day: 0.50    Years: 18.00    Pack years: 9.00    Types: Cigarettes    Quit date: 09/16/1990  Years since quitting: 31.1   Smokeless tobacco: Never  Vaping Use   Vaping Use: Never used  Substance and Sexual Activity   Alcohol use: No    Alcohol/week: 0.0 standard drinks   Drug use: No   Sexual activity: Not on file  Other Topics Concern   Not on file  Social History Narrative   Not on file   Social Determinants of Health   Financial Resource Strain: Not on file  Food Insecurity: Not on file  Transportation Needs: Not on file  Physical Activity: Not on file  Stress: Not on file  Social Connections: Not on file  Intimate Partner Violence: Not on file     Review of Systems: All other systems reviewed and are otherwise negative except as noted above.  Physical Exam: There were no vitals filed for this visit.  GEN- The patient is well appearing, alert and oriented x 3 today.   HEENT: normocephalic, atraumatic; sclera clear, conjunctiva pink; hearing intact; oropharynx clear; neck supple, no JVP Lymph- no cervical lymphadenopathy Lungs- Clear to ausculation bilaterally, normal work of breathing.  No wheezes, rales, rhonchi Heart- Regular rate and rhythm, no murmurs, rubs or gallops, PMI not laterally displaced GI- soft, non-tender, non-distended, bowel sounds present, no hepatosplenomegaly Extremities- no clubbing, cyanosis, or edema; DP/PT/radial pulses 2+ bilaterally MS- no significant deformity or atrophy Skin- warm and dry, no rash or lesion Psych- euthymic mood, full affect Neuro- strength and sensation are intact  EKG is ordered. Personal review of EKG from today shows NSr at 89 bpm  with stable QTc  Additional studies reviewed include: Previous EP notes. Urology notes.   Per urology notes:  Cytology on 09/21/21 - atypical with FISH positive  cysto URS on 10/08/21 - Normal bladder. Normal left retrograde pyelogram. Right retrograde pyelogram showed delayed filling of a lower pole calyx and filling defect.   Ureteroscopy showed normal ureter. 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 This was difficult to visualize given angle of the lower pole. Remaining kidney was normal. Right renal washing obtained. Attempted to brush the lower calyx as well.  Assessment and Plan:  1. Paroxysmal atrial fibrillation Echo 08/17/2021 LVEF 60-65%. EKG 09/04/21 showed stable QTc on tikosyn 250 mcg BID She has CHA2DS2/VASc at least 6. We discussed stroke risk at length, which would be additionally exacerbated by malignancy and sedentary nature of any post surgery healing process. She verbalizes understanding of stroke risk, and at this time prefers to hold Eliquis so that her bleeding can be more easily controlled, and so that her kidney mass can be more definitively managed.  Would consider resuming Eliquis once this has been done.   2. HTN Stable on current regimen    3. COPD On home 02   4. Chronic diastolic CHF Echo 74/0814 60-65% Continue lasix 80 mg daily. Hold lasix for weight < 198 lbs. Take extra 40 mg for weight > 206-207 per previous instructions  Follow up with Dr. [name] in  4-6 months  to re-assess bleeding and management and potentially consider resuming [place].   Shirley Friar, PA-C  11/15/21 9:06 AM

## 2021-12-17 ENCOUNTER — Ambulatory Visit: Payer: Medicare Other | Admitting: Internal Medicine

## 2021-12-27 ENCOUNTER — Ambulatory Visit: Payer: Medicare Other | Admitting: Internal Medicine

## 2022-01-06 NOTE — Progress Notes (Signed)
Subjective:  ?  ? Patient ID: Debbie [name], female   DOB: April 07, 1946     MRN: [id number] ? ?  ?Brief patient profile:  ?89   yowf quit smoking 1990 with ? pna in 2011/2012  [place] > [place] all acute changes resolved p rx for pna but ever since has required 02 at hs/ and with activity and followed previously by Dr [name] for chronic resp failure but no airflow obst on spirometry  ? ? ?History of Present Illness  ?09/29/2015 1st  office visit/ Shabreka Coulon  Transition of care  ?Chief Complaint  ?Patient presents with  ? Follow-up  ?  Former Dr [name] pt. Breathing is unchanged. She does notice it gets worse with colder weather.   ?sob bending over / struggles to walk eg HT due to R leg prosthesis and doe  So has used scooter x years ?rec ?No change in recommendations for 02  ?Try to keep your weight trending down if at all possible to help your lower lobes get better airflow  ?Please schedule a follow up visit in 12 months but call sooner if needed  ? ? ?04/04/17 echo ?Compared to a prior study in 2015, ?  there is now moderate LVH and the LVEF is higher at 65-70%. No ?  obvious PFO noted by saline microbubble contrast. There is ?  moderate TR with an RVSP of 56 mmHg and a normal, collapsing IVC. ?  Consistent with moderate pulmonary hypertension. ? ?  ? 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 Follow-up  ?  Breathing is doing well. She is using her o2 3lpm 24/7- checks sats with exertion at times and it reads above 90%3lpm pulsed.   ?Dyspnea:  100 ft / does loop at home x 5 loops and sats / also by limited by stump pain R BKA  ?Cough: none  ?Sleeping: flat /one pillow  ?SABA use: none ?02: 3lpm bedtime, none sitting and up to 3lpm ?Rec ?Make sure you check your oxygen saturation  at your highest level of activity   ?Follow up in Oct 2022  ? ?06/18/2021  f/u ov/Almus Woodham re: 02 dep resp failure p ALI from pna 2012 c/b mod WHO 3 PH   maint on 02/freq uti's   and  Macrodantin exp but not since 09/08/2019 per Epic records  ?Chief Complaint  ?Patient presents with  ? Follow-up  ?  Some labored breathing  ?Dyspnea:  50 ft / mostly uses scooter to go anywhere  ?Cough: none  ?Sleeping: flat bed one pillow no am ha /ams feels she sleeps fine ?SABA use: none  ?02: 3lpm cont and the 4lpm POC but not checking sats  ?Covid status:   total 2 x  ?Rec ?Avoid nitrofurantin at all costs  ?May need to consider reducing the dose of eliquis to 2.5 mg twice daily if Dr [name] supports  ?Make sure you check your oxygen saturation  at your highest level of activity  to be sure it stays over 90% ? ?01/07/2022  f/u ov/Laelyn Blumenthal re: 02 dep RF ? Macrodantin pf  maint on 02  3lpm   ?Chief Complaint  ?Patient presents with  ? Follow-up  ?  Breathing is fine with the oxygen  ?Dyspnea:  50 ft max / very sedentary / now riding scooter ?Cough: none  ?Sleeping: no resp cc flat bed one pillow  ?SABA use: none  ?02: 3lpm  POC  ?  ? ? ?No obvious day to day or daytime variability or assoc excess/ purulent sputum or mucus plugs or hemoptysis or cp or chest tightness, subjective wheeze or overt sinus or hb symptoms.  ? ?Sleeping as above  without nocturnal  or early am exacerbation  of respiratory  c/o's or need for noct saba. Also denies any obvious fluctuation of symptoms with weather or environmental changes or other aggravating or alleviating factors except as outlined above  ? ?No unusual exposure hx or h/o childhood pna/ asthma or knowledge of premature birth. ? ?Current Allergies, Complete Past Medical History, Past Surgical History, Family History, and Social History were reviewed in [employer] record. ? ?ROS  The following are not active complaints unless bolded ?Hoarseness, sore throat, dysphagia, dental problems, itching, sneezing,  nasal congestion or discharge of excess mucus or purulent secretions, ear ache,   fever, chills, sweats, unintended wt loss or wt gain, classically  pleuritic or exertional cp,  orthopnea pnd or arm/hand swelling  or leg swelling, presyncope, palpitations, abdominal pain, anorexia, nausea, vomiting, diarrhea  or change in bowel habits or change in bladder habits, change in stools or change in urine, dysuria, hematuria,  rash, arthralgias, visual complaints, headache, numbness, weakness or ataxia or problems with walking or coordination,  change in mood or  memory. ?      ? ?Current Meds  ?Medication Sig  ? acetaminophen (TYLENOL) 500 MG tablet Take 500 mg by mouth every 6 (six) hours as needed for mild pain (or headaches).  ? BD PEN NEEDLE NANO U/F 32G X 4 MM MISC 2 (two) times daily. as directed  ? Calcium Carb-Cholecalciferol (CALCIUM 1000 + D PO) Take 1,000 mg by mouth daily.  ? Coenzyme Q10 400 MG CAPS Take 400 mg by mouth daily.   ? dexlansoprazole (DEXILANT) 60 MG capsule Take 1 capsule (60 mg total) by mouth daily.  ? diltiazem (CARDIZEM CD) 240 MG 24 hr capsule Take 1 capsule (240 mg total) by mouth daily.  ? dofetilide (TIKOSYN) 250 MCG capsule TAKE 1 CAPSULE BY MOUTH TWICE A DAY (Patient taking differently: Take 250 mcg by mouth 2 (two) times daily.)  ? donepezil (ARICEPT) 10 MG tablet Take by mouth.  ? estradiol (ESTRACE) 0.1 MG/GM vaginal cream Place 1 Applicatorful vaginally 3 (three) times a week.  ? ezetimibe (ZETIA) 10 MG tablet Take 10 mg by mouth every evening.   ? gabapentin (NEURONTIN) 100 MG capsule Take 300 mg by mouth daily. Take 200 mg in the morning and 100 mg at night  ? glucosamine-chondroitin 500-400 MG tablet Take 1 tablet by mouth every morning.  ? halobetasol (ULTRAVATE) 0.05 % cream Apply 1 application topically 2 (two) times daily as needed (psoriasis).   ? ibandronate (BONIVA) 150 MG tablet Take 150 mg by mouth every 30 (thirty) days.  ? Insulin Glargine (BASAGLAR KWIKPEN) 100 UNIT/ML SOPN Inject 45 Units into the skin daily before breakfast.  ? Insulin Pen Needle 32G X 4 MM MISC USE TWICE DAILY AS DIRECTED  ? JANUVIA 100 MG  tablet Take 100 mg by mouth daily.  ? ketotifen (ZADITOR) 0.025 % ophthalmic solution Place 1 drop into both eyes daily as needed (for irritation).  ? Lidocaine-Glycerin (PREPARATION H EX) Place 1 application rectally 2 (two) times daily as needed (for pain).  ? losartan (COZAAR) 100 MG tablet TAKE 1 TABLET BY MOUTH EVERY DAY (Patient taking differently: Take 100 mg by mouth daily.)  ? magnesium oxide (MAG-OX)  400 (241.3 Mg) MG tablet Take 1 tablet (400 mg total) by mouth 2 (two) times daily. Please make yearly appt with Dr. [name] for April 2022 for future refills. Thank you 1st attempt  ? methenamine (HIPREX) 1 g tablet Take 1 g by mouth 2 (two) times daily.  ? ondansetron (ZOFRAN-ODT) 4 MG disintegrating tablet Take 4 mg by mouth every 8 (eight) hours as needed for nausea or vomiting.  ? OXYGEN Inhale 3 L/min into the lungs continuous.  ? potassium chloride SA (KLOR-CON M) 20 MEQ tablet Take 1 tablet (20 mEq total) by mouth 3 (three) times a week.  ? tamsulosin (FLOMAX) 0.4 MG CAPS capsule Take 0.4 mg by mouth daily.  ? tiZANidine (ZANAFLEX) 4 MG tablet Take 4 mg by mouth at bedtime.  ? VOLTAREN 1 % GEL Apply 2 g topically 4 (four) times daily as needed (for pain).  ?    ?   ?Objective:  ? Physical Exam  ? ?Wts ? ?01/07/2022      200 ? 06/18/2021     209 ? 10/17/2020       200 ?06/29/2020    195 ?06/29/2019    208  ?06/25/2018    204  ?03/24/2018        201 ?03/24/2017         211  ?09/30/2016       216   ?09/29/15 221 lb 6.4 oz (100.426 kg)  ?08/16/15 214 lb (97.07 kg)  ?05/17/15 216 lb 8 oz (98.204 kg)  ?   ?Vital signs reviewed  01/07/2022  - Note at rest 02 sats  97% on RA  ? ?General appearance:    pleasant wf riding scooter/ nad  ?  ? HEENT : nl exam   ? ? ?NECK :  without JVD/Nodes/TM/ nl carotid upstrokes bilaterally ? ? ?LUNGS: no acc muscle use,  Nl contour chest with minimal insp crackles in bases  bilaterally without cough on insp or exp maneuvers ? ? ?CV:  RRR  no s3 or murmur or increase in P2, and no edema   ? ?ABD:  soft and nontender with nl inspiratory excursion in the supine position. No bruits or organomegaly appreciated, bowel sounds nl ? ?MS:  Nl gait/ ext warm with R BKA/ prosthesis, calf tenderness,

## 2022-01-07 ENCOUNTER — Ambulatory Visit (INDEPENDENT_AMBULATORY_CARE_PROVIDER_SITE_OTHER): Payer: Medicare Other | Admitting: Internal Medicine

## 2022-01-07 ENCOUNTER — Encounter: Payer: Self-pay | Admitting: Internal Medicine

## 2022-01-07 ENCOUNTER — Ambulatory Visit: Payer: Medicare Other | Admitting: Student

## 2022-01-07 DIAGNOSIS — I272 Pulmonary hypertension, unspecified: Secondary | ICD-10-CM | POA: Diagnosis not present

## 2022-01-07 DIAGNOSIS — J9611 Chronic respiratory failure with hypoxia: Secondary | ICD-10-CM | POA: Diagnosis not present

## 2022-01-07 NOTE — Patient Instructions (Addendum)
Make sure you check your oxygen saturation  AT  your highest level of activity (NOT after you stop)   to be sure it stays over 90% and adjust  02 flow upward to maintain this level if needed but remember to turn it back to previous settings when you stop (to conserve your supply).  ? ? ?Please schedule a follow up visit in 12 months but call sooner if needed  ? ? ? ? ? ? ?

## 2022-01-08 ENCOUNTER — Encounter: Payer: Self-pay | Admitting: Internal Medicine

## 2022-01-08 NOTE — Assessment & Plan Note (Signed)
S/p pna ? ali / 2012 > on 02 ever since hs  ?ONO RA 03/2014:  Desat to 72% ?Ambulatory ox 2015:  desat with walking ?CT chest 04/2014 with no PE, no ISLD, mild basilar atx from obesity. ?PFTs 04/25/14   VC 2.82 (87%) no airflow obst/ ERV 42% and dlco 50% > corrects to 60% for alv vol ?V/Q 07/2014:  No chronic TE disease ?Echo with DD, unable to estimate PA pressures, but nothing overt suggestive of clinically significant pulmonary htn. ?[place] 06/2014:  PA 43/13, mean PA 25, PCWP 10, PVR 2 WU, negative shunt run.  ?- 09/29/2015   Walked 3lpm   2 laps @ 185 ft each stopped due to sob / slow pace no desats  ?- 03/24/2017 Patient Saturations on Room Air at Rest = 88%--increased to 96%3lpm pulsed o2  ?- Echo 04/04/17 ?- Technically difficult study. Compared to a prior study in 2015, ???there is now moderate LVH and the LVEF is higher at 65-70%. ? No obvious PFO noted by saline microbubble contrast. There is ???moderate TR with an RVSP of 56 mmHg and a normal, collapsing IVC. ???Consistent with moderate pulmonary hypertension. ?- ono RA  05/12/17  desat x 2: 55 min - 05/27/2017  rec 3lpm hs and repeat > ? Done ?> repeat  03/24/2018 >>>  ?- HC03   02/06/18  = 22  ?- 03/24/2018   Walked 3lpm Pulsed x one lap @ 185 stopped due to  Sob/ nl pace, sats 91% at end > referred for POC  ?- ONO 03/25/18 ? Done on RA desat x 380 min < 89%  So   03/31/2018 >>>  rec 3lpm and repeat > done 04/17/18 with desat x 1mn on 3lpm > no change rx  ?- PFT's  06/25/2018  FEV1 2.13 (95 % ) ratio 74  p 7 % improvement from saba p nothing prior to study with DLCO  46 % corrects to 55  % for alv volume  And ERV 4%  ?- 06/29/2019   Walked 3lpm  x one lap =  approx 250 ft - stopped due to  Back pain min  sob with sats 92% at slow pace  ? ?Well compensated but unfortunately more and more sedentary on multifactorial basis  ? ?rec ?Make sure you check your oxygen saturation at your highest level of activity to be sure it stays over 90% and keep track of it at least once a  week, more often if breathing getting worse, and let me know if losing ground.  ?

## 2022-01-08 NOTE — Assessment & Plan Note (Signed)
Echo 04/04/17 ?Compared to a prior study in 2015, ?  there is now moderate LVH and the LVEF is higher at 65-70%. No ?  obvious PFO noted by saline microbubble contrast. There is ?  moderate TR with an RVSP of 56 mmHg and a normal, collapsing IVC. ?  Consistent with moderate pulmonary hypertension. ?Echo 03/05/21 ??1. Left ventricular ejection fraction, by estimation, is 60 to 65%. The  ?left ventricle has normal function. The left ventricle has no regional  ?wall motion abnormalities. There is severe concentric left ventricular  ?hypertrophy. Left ventricular diastolic  ??parameters are consistent with Grade I diastolic dysfunction (impaired  ?relaxation). There is incoordinate septal motion.  ??2. Right ventricular systolic function is normal. The right ventricular  ?size is normal. There is severely elevated pulmonary artery systolic  ?pressure.  ??3. Left atrial size was moderately dilated.  ? ?Main rx from pulmonary persepective is to maintain 02 sats > 90% at all times  ? ?F/u can be yearly at this point, sooner prn  ? ?    ?  ? ?Each maintenance medication was reviewed in detail including emphasizing most importantly the difference between maintenance and prns and under what circumstances the prns are to be triggered using an action plan format where appropriate. ? ?Total time for H and P, chart review, counseling, reviewing 02  device(s) and generating customized AVS unique to this office visit / same day charting = 22 min  ?     ?

## 2022-01-22 ENCOUNTER — Ambulatory Visit (INDEPENDENT_AMBULATORY_CARE_PROVIDER_SITE_OTHER): Payer: Medicare Other | Admitting: Internal Medicine

## 2022-01-22 ENCOUNTER — Encounter: Payer: Self-pay | Admitting: Internal Medicine

## 2022-01-22 VITALS — BP 140/78 | HR 101 | Temp 98.1°F | Ht 68.0 in | Wt 199.6 lb

## 2022-01-22 DIAGNOSIS — K5909 Other constipation: Secondary | ICD-10-CM | POA: Diagnosis not present

## 2022-01-22 NOTE — Patient Instructions (Signed)
It was good to see you again today! ? ?Continue Dexilant 60 mg daily for acid reflux disease ? ?Stop stool softener, Gas-X and MiraLAX ? ?Try Linzess 145 for constipation.  1 gelcap daily X 14 days; samples provided.  Call me in 2 weeks and let me know how you have been doing on Linzess over the 2-week.  And we will go from there. ? ?Further GI testing may or may not be needed for your symptoms ? ?We will plan to see you back in the office in 3 months ?

## 2022-01-22 NOTE — Progress Notes (Signed)
? ? ?Primary Care Physician:  [name], MD ?Primary Gastroenterologist:  Dr.  Marland Kitchen ?Pre-Procedure History & Physical: ?HPI:  Debbie [name] is a 76 y.o. female here for follow-up.  History of GERD;  history of diarrhea-last seen a year ago.  Diarrhea felt to be medication effect (metformin).  She came off metformin due to diarrhea -diarrhea resolved and since that time she has reverted to constipation and upper abdominal discomfort/bloating.  May go 3 to 4 days without a bowel movement.  Since we saw her a year ago she developed gross hematuria on Eliquis for atrial fibrillation.  Urological work-up ultimately found a small right kidney cancer which is being followed.  She has come off Eliquis.  Hematuria has improved.  She has not had any melena or rectal bleeding.  Last colonoscopy 10 years ago no polyps at that time but history of colonic adenomas on prior examinations. ? ?GERD well-controlled on Dexilant.  No dysphagia. ? ?She has multiple significant comorbidities including heart failure, cor pulmonale and chronic respiratory failure. ? ?Past Medical History:  ?Diagnosis Date  ? A-fib ([place]) 11/15/2019  ? Abnormal liver function   ? Adenomatous polyp 12/04/2006  ? AKI (acute kidney injury) ([place])   ? Asthma   ? DM type 2 (diabetes mellitus, type 2) ([place])   ? GERD (gastroesophageal reflux disease) 02/28/2012  ? Hemorrhoid 12/04/2006  ? Hyperlipidemia   ? Hypertension   ? Hypomagnesemia   ? Hypotension 09/21/2019  ? Peripheral arterial disease ([place])   ? Persistent atrial fibrillation ([place]) 11/15/2019  ? Pulmonary hypertension ([place]) 03/28/2017  ? Echo 04/04/17 Compared to a prior study in 2015,   there is now moderate LVH and the LVEF is higher at 65-70%. No   obvious PFO noted by saline microbubble contrast. There is   moderate TR with an RVSP of 56 mmHg and a normal, collapsing IVC.   Consistent with moderate pulmonary hypertension.   C/w WHO III  rx  = adequate 02 / wt loss if possible   ? Sepsis ([place])  09/22/2019  ? 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 Onset around 2019  - rec rx for IBS/ diet 06/29/2019   ? ? ?Past Surgical History:  ?Procedure Laterality Date  ? CARDIOVERSION N/A 10/18/2019  ? Procedure: CARDIOVERSION;  Surgeon: [name], MD;  Location: [facility] ENDOSCOPY;  Service: Cardiovascular;  Laterality: N/A;  ? CHOLECYSTECTOMY    ? COLONOSCOPY  12/03/2006  ? Dr. Delight Ovens, adenomatous polyp  ? COLONOSCOPY  03/25/2012  ? Procedure: COLONOSCOPY;  Surgeon: Daneil Dolin, MD;  [facility];  Service: Endoscopy;  Laterality: N/A;  10:30  ? ESOPHAGOGASTRODUODENOSCOPY  11/03/2002  ? Dr. Gala Romney- normal exam- was done to check for possible foreign body  ? Fiberoptic bronchoscopy with endobronchial  ultrasound  10/22/2010  ? [place]  ? Right BKA  1990  ? RIGHT HEART CATHETERIZATION N/A 06/22/2014  ? Procedure: RIGHT HEART CATH;  Surgeon: Larey Dresser, MD;  Location: [facility] CATH LAB;  Service: Cardiovascular;  Laterality: N/A;  ? ? ?Prior to Admission medications   ?Medication Sig Start Date End Date Taking? Authorizing Provider  ?acetaminophen (TYLENOL) 500 MG tablet Take 500 mg by mouth every 6 (six) hours as needed for mild pain (or headaches).   Yes [provider]  ?BD PEN NEEDLE NANO U/F 32G X 4 MM MISC 2 (two) times daily. as directed 02/26/15  Yes [provider]  ?Calcium Carb-Cholecalciferol (CALCIUM 1000 + D PO) Take  1,000 mg by mouth daily.   Yes [provider]  ?Coenzyme Q10 400 MG CAPS Take 400 mg by mouth daily.    Yes [provider]  ?dexlansoprazole (DEXILANT) 60 MG capsule Take 1 capsule (60 mg total) by mouth daily. 09/29/15  Yes Tanda Rockers, MD  ?diltiazem (CARDIZEM CD) 240 MG 24 hr capsule Take 1 capsule (240 mg total) by mouth daily. 09/25/19  Yes Florencia Reasons, MD  ?docusate sodium (COLACE) 100 MG capsule Take 100 mg by mouth 2 (two) times daily.   Yes [provider]  ?dofetilide (TIKOSYN) 250 MCG capsule TAKE 1 CAPSULE BY MOUTH TWICE A  DAY ?Patient taking differently: Take 250 mcg by mouth 2 (two) times daily. 10/05/21  Yes Evans Lance, MD  ?donepezil (ARICEPT) 10 MG tablet Take by mouth. 10/26/21  Yes [provider]  ?estradiol (ESTRACE) 0.1 MG/GM vaginal cream Place 1 Applicatorful vaginally 3 (three) times a week. 04/19/21  Yes [provider]  ?ezetimibe (ZETIA) 10 MG tablet Take 10 mg by mouth every evening.  08/27/19  Yes [provider]  ?furosemide (LASIX) 40 MG tablet Take 2 tablets (80 mg total) by mouth daily. 09/04/21 01/22/22 Yes TillerySatira Mccallum, PA-C  ?gabapentin (NEURONTIN) 100 MG capsule Take 300 mg by mouth daily. Take 200 mg in the morning and 100 mg at night   Yes [provider]  ?glucosamine-chondroitin 500-400 MG tablet Take 1 tablet by mouth every morning.   Yes [provider]  ?halobetasol (ULTRAVATE) 0.05 % cream Apply 1 application topically 2 (two) times daily as needed (psoriasis).  08/15/16  Yes [provider]  ?ibandronate (BONIVA) 150 MG tablet Take 150 mg by mouth every 30 (thirty) days. 02/15/20  Yes [provider]  ?Insulin Glargine (BASAGLAR KWIKPEN) 100 UNIT/ML SOPN Inject 45 Units into the skin daily before breakfast.   Yes [provider]  ?Insulin Pen Needle 32G X 4 MM MISC USE TWICE DAILY AS DIRECTED 05/31/15  Yes [provider]  ?JANUVIA 100 MG tablet Take 100 mg by mouth daily. 04/05/21  Yes [provider]  ?ketotifen (ZADITOR) 0.025 % ophthalmic solution Place 1 drop into both eyes daily as needed (for irritation).   Yes [provider]  ?Lidocaine-Glycerin (PREPARATION H EX) Place 1 application rectally 2 (two) times daily as needed (for pain).   Yes [provider]  ?losartan (COZAAR) 100 MG tablet TAKE 1 TABLET BY MOUTH EVERY DAY ?Patient taking differently: Take 100 mg by mouth daily. 06/08/21  Yes Evans Lance, MD  ?magnesium oxide (MAG-OX) 400 (241.3 Mg) MG tablet Take 1 tablet (400  mg total) by mouth 2 (two) times daily. Please make yearly appt with Dr. [name] for April 2022 for future refills. Thank you 1st attempt 10/18/20  Yes Evans Lance, MD  ?methenamine (HIPREX) 1 g tablet Take 1 g by mouth 2 (two) times daily. 05/17/21  Yes [provider]  ?ondansetron (ZOFRAN-ODT) 4 MG disintegrating tablet Take 4 mg by mouth every 8 (eight) hours as needed for nausea or vomiting.   Yes [provider]  ?OXYGEN Inhale 3 L/min into the lungs continuous.   Yes [provider]  ?polyethylene glycol (MIRALAX / GLYCOLAX) 17 g packet Take 17 g by mouth daily.   Yes [provider]  ?potassium chloride SA (KLOR-CON M) 20 MEQ tablet Take 1 tablet (20 mEq total) by mouth 3 (three) times a week. 08/20/21  Yes Geradine Girt, DO  ?  simethicone (MYLICON) 485 MG chewable tablet Chew 125 mg by mouth every 6 (six) hours as needed for flatulence.   Yes [provider]  ?tamsulosin (FLOMAX) 0.4 MG CAPS capsule Take 0.4 mg by mouth daily. 10/29/21  Yes [provider]  ?tiZANidine (ZANAFLEX) 4 MG tablet Take 4 mg by mouth at bedtime. 11/04/19  Yes [provider]  ?VOLTAREN 1 % GEL Apply 2 g topically 4 (four) times daily as needed (for pain). 08/13/17  Yes [provider]  ? ? ?Allergies as of 01/22/2022 - Review Complete 01/22/2022  ?Allergen Reaction Noted  ? Meloxicam Other (See Comments) 10/18/2010  ? ? ?Family History  ?Problem Relation Age of Onset  ? CVA Mother 35  ? Heart disease Mother   ? Diabetes Mother 65  ? Lung cancer Father   ?     lung carcinoma  ? COPD Sister   ? COPD Sister   ? Diabetes Sister   ? Breast cancer Sister   ?     Mastectomy  ? Cancer Maternal Grandmother   ? Colon cancer Neg Hx   ? ? ?Social History  ? ?Socioeconomic History  ? Marital status: Married  ?  Spouse name: Novis League  ? Number of children: 3  ? Years of education: Not on file  ? Highest education level: Not on file  ?Occupational History  ? Occupation:  retired  ?  Employer: UNEMPLOYED  ?Tobacco Use  ? Smoking status: Former  ?  Packs/day: 0.50  ?  Years: 18.00  ?  Pack years: 9.00  ?  Types: Cigarettes  ?  Quit date: 09/16/1990  ?  Years since quitting:

## 2022-02-06 ENCOUNTER — Telehealth: Payer: Self-pay

## 2022-02-06 NOTE — Telephone Encounter (Signed)
Pt called to let you know that the linzess is working well. Pt also wanting to let you know that she had an u/s and ct done on her kidney and has to have another surgery for that by her urologist.

## 2022-03-22 ENCOUNTER — Ambulatory Visit: Payer: Medicare Other | Admitting: Internal Medicine

## 2022-03-26 ENCOUNTER — Encounter: Payer: Self-pay | Admitting: Gastroenterology

## 2022-03-28 ENCOUNTER — Encounter: Payer: Self-pay | Admitting: Internal Medicine

## 2022-03-28 ENCOUNTER — Ambulatory Visit (INDEPENDENT_AMBULATORY_CARE_PROVIDER_SITE_OTHER): Payer: Medicare Other | Admitting: Internal Medicine

## 2022-03-28 VITALS — BP 142/86 | HR 72 | Ht 68.0 in | Wt 182.2 lb

## 2022-03-28 DIAGNOSIS — I480 Paroxysmal atrial fibrillation: Secondary | ICD-10-CM

## 2022-03-28 MED ORDER — APIXABAN 5 MG PO TABS: 5.0000 mg | ORAL_TABLET | Freq: Two times a day (BID) | ORAL | 6 refills | Status: DC

## 2022-03-28 NOTE — Patient Instructions (Addendum)
Medication Instructions:  Your physician has recommended you make the following change in your medication:    YOU will START TAKING ELIQUIS 5 mg- on 05/03/2022  TAKE ONE tablet by mouth TWICE DAILY.    Lab Work: None ordered.  If you have labs (blood work) drawn today and your tests are completely normal, you will receive your results only by: [place] (if you have MyChart) OR A paper copy in the mail If you have any lab test that is abnormal or we need to change your treatment, we will call you to review the results.  Testing/Procedures: None ordered.  Follow-Up: At [facility], you and your health needs are our priority.  As part of our continuing mission to provide you with exceptional heart care, we have created designated Provider Care Teams.  These Care Teams include your primary Cardiologist (physician) and Advanced Practice Providers (APPs -  Physician Assistants and Nurse Practitioners) who all work together to provide you with the care you need, when you need it.  We recommend signing up for the patient portal called "MyChart".  Sign up information is provided on this After Visit Summary.  MyChart is used to connect with patients for Virtual Visits (Telemedicine).  Patients are able to view lab/test results, encounter notes, upcoming appointments, etc.  Non-urgent messages can be sent to your provider as well.   To learn more about what you can do with MyChart, go to [email or web address].    Your next appointment:   1 year(s)  The format for your next appointment:   In Person  Provider:   Cristopher Peru, MD{or one of the following Advanced Practice Providers on your designated Care Team:   Tommye Standard, [place] Legrand Como "Jonni Sanger" Chalmers Cater, [place]   Important Information About Sugar      Apixaban Tablets What is this medication? APIXABAN (a PIX a ban) prevents or treats blood clots. It is also used to lower the risk of stroke in people with AFib (atrial  fibrillation). It belongs to a group of medications called blood thinners. This medicine may be used for other purposes; ask your health care provider or pharmacist if you have questions. COMMON BRAND NAME(S): Eliquis What should I tell my care team before I take this medication? They need to know if you have any of these conditions: Antiphospholipid antibody syndrome Bleeding disorder History of bleeding in the brain History of blood clots History of stomach bleeding Kidney disease Liver disease Mechanical heart valve Spinal surgery An unusual or allergic reaction to apixaban, other medications, foods, dyes, or preservatives Pregnant or trying to get pregnant Breast-feeding How should I use this medication? Take this medication by mouth. For your therapy to work as well as possible, take each dose exactly as prescribed on the prescription label. Do not skip doses. Skipping doses or stopping this medication can increase your risk of a blood clot or stroke. Keep taking this medication unless your care team tells you to stop. Take it as directed on the prescription label at the same time every day. You can take it with or without food. If it upsets your stomach, take it with food. A special MedGuide will be given to you by the pharmacist with each prescription and refill. Be sure to read this information carefully each time. Talk to your care team about the use of this medication in children. Special care may be needed. Overdosage: If you think you have taken too much of this medicine contact a poison control center or  emergency room at once. NOTE: This medicine is only for you. Do not share this medicine with others. What if I miss a dose? If you miss a dose, take it as soon as you can. If it is almost time for your next dose, take only that dose. Do not take double or extra doses. What may interact with this medication? This medication may interact with the following: Aspirin and  aspirin-like medications Certain medications for fungal infections like itraconazole and ketoconazole Certain medications for seizures like carbamazepine and phenytoin Certain medications for blood clots like enoxaparin, dalteparin, heparin, and warfarin Clarithromycin NSAIDs, medications for pain and inflammation, like ibuprofen or naproxen Rifampin Ritonavir St. John's wort This list may not describe all possible interactions. Give your health care provider a list of all the medicines, herbs, non-prescription drugs, or dietary supplements you use. Also tell them if you smoke, drink alcohol, or use illegal drugs. Some items may interact with your medicine. What should I watch for while using this medication? Visit your healthcare professional for regular checks on your progress. You may need blood work done while you are taking this medication. Your condition will be monitored carefully while you are receiving this medication. It is important not to miss any appointments. Avoid sports and activities that might cause injury while you are using this medication. Severe falls or injuries can cause unseen bleeding. Be careful when using sharp tools or knives. Consider using an Copy. Take special care brushing or flossing your teeth. Report any injuries, bruising, or red spots on the skin to your healthcare professional. If you are going to need surgery or other procedure, tell your healthcare professional that you are taking this medication. Wear a medical ID bracelet or chain. Carry a card that describes your disease and details of your medication and dosage times. What side effects may I notice from receiving this medication? Side effects that you should report to your care team as soon as possible: Allergic reactions--skin rash, itching, hives, swelling of the face, lips, tongue, or throat Bleeding--bloody or black, tar-like stools, vomiting blood or brown material that looks like coffee  grounds, red or dark brown urine, small red or purple spots on the skin, unusual bruising or bleeding Bleeding in the brain--severe headache, stiff neck, confusion, dizziness, change in vision, numbness or weakness of the face, arm, or leg, trouble speaking, trouble walking, vomiting Heavy periods This list may not describe all possible side effects. Call your doctor for medical advice about side effects. You may report side effects to FDA at 1-800-FDA-1088. Where should I keep my medication? Keep out of the reach of children and pets. Store at room temperature between 20 and 25 degrees C (68 and 77 degrees F). Get rid of any unused medication after the expiration date. To get rid of medications that are no longer needed or expired: Take the medication to a medication take-back program. Check with your pharmacy or law enforcement to find a location. If you cannot return the medication, check the label or package insert to see if the medication should be thrown out in the garbage or flushed down the toilet. If you are not sure, ask your care team. If it is safe to put in the trash, empty the medication out of the container. Mix the medication with cat litter, dirt, coffee grounds, or other unwanted substance. Seal the mixture in a bag or container. Put it in the trash. NOTE: This sheet is a summary. It may not cover  all possible information. If you have questions about this medicine, talk to your doctor, pharmacist, or health care provider.  2023 Elsevier/Gold Standard (2020-09-29 00:00:00)

## 2022-03-28 NOTE — Progress Notes (Signed)
HPI Debbie [name] returns today for followup of atrial fib. She is a pleasant 76 yo woman with a h/o atrial fib and underwent initiation of dofetilide over 3 years ago. In the interim, she has done reasonably well except she has developed hematuria which has been found to be due to Renal CA.  She has no chest pain. Her dyspnea is improved in NSR. We had to reduce her dose of dofetilide from 500 to 250 due to QT prolongation. She is pending nephrectomy. Her hematuria resolved when she stopped eliquis. Allergies  Allergen Reactions   Meloxicam Other (See Comments)    Causes excess Fluid buildup     Current Outpatient Medications  Medication Sig Dispense Refill   acetaminophen (TYLENOL) 500 MG tablet Take 500 mg by mouth every 6 (six) hours as needed for mild pain (or headaches).     [START ON 05/03/2022] apixaban (ELIQUIS) 5 MG TABS tablet Take 1 tablet (5 mg total) by mouth 2 (two) times daily. 60 tablet 6   BD PEN NEEDLE NANO U/F 32G X 4 MM MISC 2 (two) times daily. as directed  6   Calcium Carb-Cholecalciferol (CALCIUM 1000 + D PO) Take 1,000 mg by mouth daily.     Coenzyme Q10 400 MG CAPS Take 400 mg by mouth daily.      dexlansoprazole (DEXILANT) 60 MG capsule Take 1 capsule (60 mg total) by mouth daily.     diltiazem (CARDIZEM CD) 240 MG 24 hr capsule Take 1 capsule (240 mg total) by mouth daily. 30 capsule 0   docusate sodium (COLACE) 100 MG capsule Take 100 mg by mouth 2 (two) times daily.     dofetilide (TIKOSYN) 250 MCG capsule TAKE 1 CAPSULE BY MOUTH TWICE A DAY (Patient taking differently: Take 250 mcg by mouth 2 (two) times daily.) 180 capsule 3   donepezil (ARICEPT) 10 MG tablet Take by mouth.     estradiol (ESTRACE) 0.1 MG/GM vaginal cream Place 1 Applicatorful vaginally 3 (three) times a week.     ezetimibe (ZETIA) 10 MG tablet Take 10 mg by mouth every evening.      gabapentin (NEURONTIN) 100 MG capsule Take 300 mg by mouth daily. Take 200 mg in the morning and 100 mg  at night     glucosamine-chondroitin 500-400 MG tablet Take 1 tablet by mouth every morning.     halobetasol (ULTRAVATE) 0.05 % cream Apply 1 application topically 2 (two) times daily as needed (psoriasis).      ibandronate (BONIVA) 150 MG tablet Take 150 mg by mouth every 30 (thirty) days.     Insulin Glargine (BASAGLAR KWIKPEN) 100 UNIT/ML SOPN Inject 45 Units into the skin daily before breakfast.     Insulin Pen Needle 32G X 4 MM MISC USE TWICE DAILY AS DIRECTED     JANUVIA 100 MG tablet Take 100 mg by mouth daily.     ketotifen (ZADITOR) 0.025 % ophthalmic solution Place 1 drop into both eyes daily as needed (for irritation).     Lidocaine-Glycerin (PREPARATION H EX) Place 1 application rectally 2 (two) times daily as needed (for pain).     losartan (COZAAR) 100 MG tablet TAKE 1 TABLET BY MOUTH EVERY DAY (Patient taking differently: Take 100 mg by mouth daily.) 90 tablet 3   magnesium oxide (MAG-OX) 400 (241.3 Mg) MG tablet Take 1 tablet (400 mg total) by mouth 2 (two) times daily. Please make yearly appt with Dr. [name] for April 2022 for future  refills. Thank you 1st attempt 60 tablet 2   methenamine (HIPREX) 1 g tablet Take 1 g by mouth 2 (two) times daily.     ondansetron (ZOFRAN-ODT) 4 MG disintegrating tablet Take 4 mg by mouth every 8 (eight) hours as needed for nausea or vomiting.     OXYGEN Inhale 3 L/min into the lungs continuous.     polyethylene glycol (MIRALAX / GLYCOLAX) 17 g packet Take 17 g by mouth daily.     potassium chloride SA (KLOR-CON M) 20 MEQ tablet Take 1 tablet (20 mEq total) by mouth 3 (three) times a week. 30 tablet 0   simethicone (MYLICON) 240 MG chewable tablet Chew 125 mg by mouth every 6 (six) hours as needed for flatulence.     tamsulosin (FLOMAX) 0.4 MG CAPS capsule Take 0.4 mg by mouth daily.     tiZANidine (ZANAFLEX) 4 MG tablet Take 4 mg by mouth at bedtime.     VOLTAREN 1 % GEL Apply 2 g topically 4 (four) times daily as needed (for pain).  2    furosemide (LASIX) 40 MG tablet Take 2 tablets (80 mg total) by mouth daily. 180 tablet 3   No current facility-administered medications for this visit.     Past Medical History:  Diagnosis Date   A-fib ([place]) 11/15/2019   Abnormal liver function    Adenomatous polyp 12/04/2006   AKI (acute kidney injury) ([place])    Asthma    DM type 2 (diabetes mellitus, type 2) ([place])    GERD (gastroesophageal reflux disease) 02/28/2012   Hemorrhoid 12/04/2006   Hyperlipidemia    Hypertension    Hypomagnesemia    Hypotension 09/21/2019   Peripheral arterial disease ([place])    Persistent atrial fibrillation ([place]) 11/15/2019   Pulmonary hypertension ([place]) 03/28/2017   Echo 04/04/17 Compared to a prior study in 2015,   there is now moderate LVH and the LVEF is higher at 65-70%. No   obvious PFO noted by saline microbubble contrast. There is   moderate TR with an RVSP of 56 mmHg and a normal, collapsing IVC.   Consistent with moderate pulmonary hypertension.   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   Past Surgical History:  Procedure Laterality Date   CARDIOVERSION N/A 10/18/2019   Procedure: CARDIOVERSION;  Surgeon: [name], MD;  Location: [place];  Service: Cardiovascular;  Laterality: N/A;   CHOLECYSTECTOMY     COLONOSCOPY  12/03/2006   Dr. Delight Ovens, adenomatous polyp   COLONOSCOPY  03/25/2012   Procedure: COLONOSCOPY;  Surgeon: Daneil Dolin, MD;  Location: [facility];  Service: Endoscopy;  Laterality: N/A;  10:30   ESOPHAGOGASTRODUODENOSCOPY  11/03/2002   Dr. Gala Romney- normal exam- was done to check for possible foreign body   Fiberoptic bronchoscopy with endobronchial  ultrasound  10/22/2010   [place]   Right BKA  1990   RIGHT HEART CATHETERIZATION N/A 06/22/2014    Procedure: RIGHT HEART CATH;  Surgeon: Larey Dresser, MD;  Location: [facility] CATH LAB;  Service: Cardiovascular;  Laterality: N/A;     Family History  Problem Relation Age of Onset   CVA Mother 44   Heart disease Mother    Diabetes Mother 15  Lung cancer Father        lung carcinoma   COPD Sister    COPD Sister    Diabetes Sister    Breast cancer Sister        Mastectomy   Cancer Maternal Grandmother    Colon cancer Neg Hx      Social History   Socioeconomic History   Marital status: Married    Spouse name: Engineer, water   Number of children: 3   Years of education: Not on file   Highest education level: Not on file  Occupational History   Occupation: retired    Fish farm manager: UNEMPLOYED  Tobacco Use   Smoking status: Former    Packs/day: 0.50    Years: 18.00    Total pack years: 9.00    Types: Cigarettes    Quit date: 09/16/1990    Years since quitting: 31.5   Smokeless tobacco: Never  Vaping Use   Vaping Use: Never used  Substance and Sexual Activity   Alcohol use: No    Alcohol/week: 0.0 standard drinks of alcohol   Drug use: No   Sexual activity: Not Currently  Other Topics Concern   Not on file  Social History Narrative   Not on file   Social Determinants of Health   Financial Resource Strain: Not on file  Food Insecurity: Not on file  Transportation Needs: Not on file  Physical Activity: Not on file  Stress: Not on file  Social Connections: Not on file  Intimate Partner Violence: Not on file     BP (!) 142/86   Pulse 72   Ht '5\' 8"'$  (1.727 m)   Wt 182 lb 3.2 oz (82.6 kg)   SpO2 95%   BMI 27.70 kg/m   Physical Exam:  Well appearing NAD HEENT: Unremarkable Neck:  No JVD, no thyromegally Lymphatics:  No adenopathy Back:  No CVA tenderness Lungs:  Clear with no wheeze HEART:  Regular rate rhythm, no murmurs, no rubs, no clicks Abd:  soft, positive bowel sounds, no organomegally, no rebound, no guarding Ext:  2 plus pulses, no edema, no  cyanosis, no clubbing Skin:  No rashes no nodules Neuro:  CN II through XII intact, motor grossly intact  EKG - nsr  Assess/Plan:  Hematuria - she has not had more since stopping her eliquis. She will undergo nephrectomy PAF - she is maintaining NSR. Continue dofetilide. Coags - I recommend she restart the eliqius one week after nephrectomy.  Carleene Overlie Delorus Langwell,MD

## 2022-04-12 ENCOUNTER — Emergency Department (HOSPITAL_BASED_OUTPATIENT_CLINIC_OR_DEPARTMENT_OTHER)
Admission: EM | Admit: 2022-04-12 | Discharge: 2022-04-13 | Disposition: A | Discharge status: Home / Self Care | Payer: Medicare Other | Attending: Emergency Medicine | Admitting: Emergency Medicine

## 2022-04-12 ENCOUNTER — Other Ambulatory Visit: Payer: Self-pay

## 2022-04-12 ENCOUNTER — Encounter (HOSPITAL_BASED_OUTPATIENT_CLINIC_OR_DEPARTMENT_OTHER): Payer: Self-pay

## 2022-04-12 DIAGNOSIS — Z7901 Long term (current) use of anticoagulants: Secondary | ICD-10-CM | POA: Diagnosis not present

## 2022-04-12 DIAGNOSIS — D4959 Neoplasm of unspecified behavior of other genitourinary organ: Secondary | ICD-10-CM | POA: Insufficient documentation

## 2022-04-12 DIAGNOSIS — D649 Anemia, unspecified: Secondary | ICD-10-CM

## 2022-04-12 DIAGNOSIS — R319 Hematuria, unspecified: Secondary | ICD-10-CM | POA: Insufficient documentation

## 2022-04-12 DIAGNOSIS — Z79899 Other long term (current) drug therapy: Secondary | ICD-10-CM | POA: Insufficient documentation

## 2022-04-12 LAB — CBC WITH DIFFERENTIAL/PLATELET
Abs Immature Granulocytes: 0.04 10*3/uL (ref 0.00–0.07)
Basophils Absolute: 0.1 10*3/uL (ref 0.0–0.1)
Basophils Relative: 0 %
Eosinophils Absolute: 0.2 10*3/uL (ref 0.0–0.5)
Eosinophils Relative: 2 %
HCT: 25 % — ABNORMAL LOW (ref 36.0–46.0)
Hemoglobin: 7.6 g/dL — ABNORMAL LOW (ref 12.0–15.0)
Immature Granulocytes: 0 %
Lymphocytes Relative: 20 %
Lymphs Abs: 2.3 10*3/uL (ref 0.7–4.0)
MCH: 23.7 pg — ABNORMAL LOW (ref 26.0–34.0)
MCHC: 30.4 g/dL (ref 30.0–36.0)
MCV: 77.9 fL — ABNORMAL LOW (ref 80.0–100.0)
Monocytes Absolute: 0.7 10*3/uL (ref 0.1–1.0)
Monocytes Relative: 6 %
Neutro Abs: 8 10*3/uL — ABNORMAL HIGH (ref 1.7–7.7)
Neutrophils Relative %: 72 %
Platelets: 369 10*3/uL (ref 150–400)
RBC: 3.21 MIL/uL — ABNORMAL LOW (ref 3.87–5.11)
RDW: 15.4 % (ref 11.5–15.5)
WBC: 11.3 10*3/uL — ABNORMAL HIGH (ref 4.0–10.5)
nRBC: 0 % (ref 0.0–0.2)

## 2022-04-12 LAB — COMPREHENSIVE METABOLIC PANEL
ALT: 7 U/L (ref 0–44)
AST: 10 U/L — ABNORMAL LOW (ref 15–41)
Albumin: 4.1 g/dL (ref 3.5–5.0)
Alkaline Phosphatase: 75 U/L (ref 38–126)
Anion gap: 11 (ref 5–15)
BUN: 14 mg/dL (ref 8–23)
CO2: 26 mmol/L (ref 22–32)
Calcium: 9.7 mg/dL (ref 8.9–10.3)
Chloride: 101 mmol/L (ref 98–111)
Creatinine, Ser: 0.99 mg/dL (ref 0.44–1.00)
GFR, Estimated: 59 mL/min — ABNORMAL LOW (ref >60)
Glucose, Bld: 108 mg/dL — ABNORMAL HIGH (ref 70–99)
Potassium: 4.2 mmol/L (ref 3.5–5.1)
Sodium: 138 mmol/L (ref 135–145)
Total Bilirubin: 0.3 mg/dL (ref 0.3–1.2)
Total Protein: 7.2 g/dL (ref 6.5–8.1)

## 2022-04-12 LAB — CBG MONITORING, ED: Glucose-Capillary: 93 mg/dL (ref 70–99)

## 2022-04-12 LAB — PREPARE RBC (CROSSMATCH)

## 2022-04-12 MED ORDER — SODIUM CHLORIDE 0.9 % IV SOLN
10.0000 mL/h | Freq: Once | INTRAVENOUS | Status: AC
  Administered 2022-04-12: 10 mL/h via INTRAVENOUS

## 2022-04-12 NOTE — ED Triage Notes (Signed)
Patient here POV from Home.  Patient endorsing being Anemic and having Difficulty with PCP Office to have Blood Transfusion scheduled. Sent by Same for Transfusion. 7.4 Hgb. Lab Specimens collected Yesterday.   Scheduled for Surgery to have Kidney and Uterus removed in approximately 2 Weeks. Wears 3LNC of Oxygen at Home Chronically.   NAD Noted during Triage. A&Ox4. GCS 15. BIB Wheelchair.

## 2022-04-12 NOTE — ED Notes (Addendum)
Patient BIB [facility] from [facility], report received from transport.

## 2022-04-12 NOTE — ED Notes (Signed)
[facility] at the Bedside. 

## 2022-04-12 NOTE — Discharge Instructions (Addendum)
We evaluated you in the emergency department for your anemia.  You received a blood transfusion.  Please follow-up with your primary care physician for repeat lab testing.  Please return to the emergency department should you experience any recurrent symptoms.

## 2022-04-12 NOTE — ED Provider Notes (Signed)
[facility] EMERGENCY DEPT Provider Note   CSN: [id number] Arrival date & time: 04/12/22  1402     History  Chief Complaint  Patient presents with   Anemia    Debbie [name] is a 76 y.o. female.  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 Ky Barban urology presenting to emergency department for blood transfusion after mentation from Dr. Ky Barban.  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  Current globin level at 7.4 with active hematuria.  Patient admits to generalized weakness, fatigue, and feeling cold.  The history is provided by the patient. No language interpreter was used.  Anemia Pertinent negatives include no chest pain, no abdominal pain and no shortness of breath.       Home Medications Prior to Admission medications   Medication Sig Start Date End Date Taking? Authorizing Provider  acetaminophen (TYLENOL) 500 MG tablet Take 500 mg by mouth every 6 (six) hours as needed for mild pain (or headaches).    [provider]  apixaban (ELIQUIS) 5 MG TABS tablet Take 1 tablet (5 mg total) by mouth 2 (two) times daily. 05/03/22   Evans Lance, MD  BD PEN NEEDLE NANO U/F 32G X 4 MM MISC 2 (two) times daily. as directed 02/26/15   [provider]  Calcium Carb-Cholecalciferol (CALCIUM 1000 + D PO) Take 1,000 mg by mouth daily.    [provider]  Coenzyme Q10 400 MG CAPS Take 400 mg by mouth daily.     [provider]  dexlansoprazole (DEXILANT) 60 MG capsule Take 1 capsule (60 mg total) by mouth daily. 09/29/15   Tanda Rockers, MD  diltiazem (CARDIZEM CD) 240 MG 24 hr capsule Take 1 capsule (240 mg total) by mouth daily. 09/25/19   Florencia Reasons, MD  docusate sodium (COLACE) 100 MG capsule Take 100 mg by mouth 2 (two) times daily.     [provider]  dofetilide (TIKOSYN) 250 MCG capsule TAKE 1 CAPSULE BY MOUTH TWICE A DAY Patient taking differently: Take 250 mcg by mouth 2 (two) times daily. 10/05/21   Evans Lance, MD  donepezil (ARICEPT) 10 MG tablet Take by mouth. 10/26/21   [provider]  estradiol (ESTRACE) 0.1 MG/GM vaginal cream Place 1 Applicatorful vaginally 3 (three) times a week. 04/19/21   [provider]  ezetimibe (ZETIA) 10 MG tablet Take 10 mg by mouth every evening.  08/27/19   [provider]  furosemide (LASIX) 40 MG tablet Take 2 tablets (80 mg total) by mouth daily. 09/04/21 01/22/22  Shirley Friar, PA-C  gabapentin (NEURONTIN) 100 MG capsule Take 300 mg by mouth daily. Take 200 mg in the morning and 100 mg at night    [provider]  glucosamine-chondroitin 500-400 MG tablet Take 1 tablet by mouth every morning.    [provider]  halobetasol (ULTRAVATE) 0.05 % cream Apply 1 application topically 2 (two) times daily as needed (psoriasis).  08/15/16   [provider]  ibandronate (BONIVA) 150 MG tablet Take 150 mg by mouth every 30 (thirty) days. 02/15/20   [provider]  Insulin Glargine (BASAGLAR KWIKPEN) 100 UNIT/ML SOPN Inject 45 Units into the skin daily before breakfast.    [provider]  Insulin Pen Needle 32G X 4 MM MISC USE TWICE DAILY AS  DIRECTED 05/31/15   [provider]  JANUVIA 100 MG tablet Take 100 mg by mouth daily. 04/05/21   [provider]  ketotifen (ZADITOR) 0.025 % ophthalmic solution Place 1 drop into both eyes daily as needed (for irritation).    [provider]  Lidocaine-Glycerin (PREPARATION H EX) Place 1 application rectally 2 (two) times daily as needed (for pain).    [provider]  losartan (COZAAR) 100 MG tablet TAKE 1 TABLET BY MOUTH EVERY DAY Patient taking differently: Take 100 mg by mouth daily. 06/08/21   Evans Lance, MD  magnesium oxide  (MAG-OX) 400 (241.3 Mg) MG tablet Take 1 tablet (400 mg total) by mouth 2 (two) times daily. Please make yearly appt with Dr. [name] for April 2022 for future refills. Thank you 1st attempt 10/18/20   Evans Lance, MD  methenamine (HIPREX) 1 g tablet Take 1 g by mouth 2 (two) times daily. 05/17/21   [provider]  ondansetron (ZOFRAN-ODT) 4 MG disintegrating tablet Take 4 mg by mouth every 8 (eight) hours as needed for nausea or vomiting.    [provider]  OXYGEN Inhale 3 L/min into the lungs continuous.    [provider]  polyethylene glycol (MIRALAX / GLYCOLAX) 17 g packet Take 17 g by mouth daily.    [provider]  potassium chloride SA (KLOR-CON M) 20 MEQ tablet Take 1 tablet (20 mEq total) by mouth 3 (three) times a week. 08/20/21   Geradine Girt, DO  simethicone (MYLICON) 559 MG chewable tablet Chew 125 mg by mouth every 6 (six) hours as needed for flatulence.    [provider]  tamsulosin (FLOMAX) 0.4 MG CAPS capsule Take 0.4 mg by mouth daily. 10/29/21   [provider]  tiZANidine (ZANAFLEX) 4 MG tablet Take 4 mg by mouth at bedtime. 11/04/19   [provider]  VOLTAREN 1 % GEL Apply 2 g topically 4 (four) times daily as needed (for pain). 08/13/17   [provider]      Allergies    Meloxicam    Review of Systems   Review of Systems  Constitutional:  Positive for chills and fatigue. Negative for fever.  HENT:  Negative for ear pain and sore throat.   Eyes:  Negative for pain and visual disturbance.  Respiratory:  Negative for cough and shortness of breath.   Cardiovascular:  Negative for chest pain and palpitations.  Gastrointestinal:  Negative for abdominal pain and vomiting.  Genitourinary:  Positive for hematuria. Negative for dysuria.  Musculoskeletal:  Negative for arthralgias and back pain.  Skin:  Negative for color change and rash.  Neurological:  Positive for weakness. Negative for seizures and  syncope.  All other systems reviewed and are negative.   Physical Exam Updated Vital Signs BP (!) 151/82   Pulse 94   Temp 98.8 F (37.1 C) (Oral)   Resp 18   Ht '5\' 8"'$  (1.727 m)   Wt 82.6 kg   SpO2 100%   BMI 27.69 kg/m  Physical Exam Vitals and nursing note reviewed.  Constitutional:      General: She is not in acute distress.    Appearance: She is well-developed.  HENT:     Head: Normocephalic and atraumatic.  Eyes:     Conjunctiva/sclera: Conjunctivae normal.  Cardiovascular:     Rate and Rhythm: Normal rate and regular rhythm.     Heart sounds: No murmur heard. Pulmonary:     Effort: Pulmonary  effort is normal. No respiratory distress.     Breath sounds: Normal breath sounds.  Abdominal:     Palpations: Abdomen is soft.     Tenderness: There is no abdominal tenderness.  Musculoskeletal:        General: No swelling.     Cervical back: Neck supple.  Skin:    General: Skin is warm and dry.     Capillary Refill: Capillary refill takes less than 2 seconds.     Coloration: Skin is pale.  Neurological:     General: No focal deficit present.     Mental Status: She is alert and oriented to person, place, and time.     GCS: GCS eye subscore is 4. GCS verbal subscore is 5. GCS motor subscore is 6.  Psychiatric:        Mood and Affect: Mood normal.     ED Results / Procedures / Treatments   Labs (all labs ordered are listed, but only abnormal results are displayed) Labs Reviewed  CBC WITH DIFFERENTIAL/PLATELET - Abnormal; Notable for the following components:      Result Value   WBC 11.3 (*)    RBC 3.21 (*)    Hemoglobin 7.6 (*)    HCT 25.0 (*)    MCV 77.9 (*)    MCH 23.7 (*)    Neutro Abs 8.0 (*)    All other components within normal limits  COMPREHENSIVE METABOLIC PANEL - Abnormal; Notable for the following components:   Glucose, Bld 108 (*)    AST 10 (*)    GFR, Estimated 59 (*)    All other components within normal limits  PREPARE RBC (CROSSMATCH)     EKG None  Radiology No results found.  Procedures .Critical Care  Performed by: Lianne Cure, DO Authorized by: Lianne Cure, DO   Critical care provider statement:    Critical care time (minutes):  36   Critical care was necessary to treat or prevent imminent or life-threatening deterioration of the following conditions: blood transfusion.   Critical care was time spent personally by me on the following activities:  Development of treatment plan with patient or surrogate, discussions with consultants, evaluation of patient's response to treatment, examination of patient, ordering and review of laboratory studies, ordering and review of radiographic studies, ordering and performing treatments and interventions, pulse oximetry, re-evaluation of patient's condition and review of old charts     Medications Ordered in ED Medications  0.9 %  sodium chloride infusion (has no administration in time range)    ED Course/ Medical Decision Making/ A&P                           Medical Decision Making  80:40 PM 76 year old female with past medical history of 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 Ky Barban urology presenting to emergency department for blood transfusion after mentation from Dr. Ky Barban.   Patient accepted for ED to ED transfer by Dr. Regenia Skeeter for blood transfusion for symptomatic anemia.  Blood transfusion is uncomplicated patient will be stable for discharge home with follow-up with her urologist.        Final Clinical Impression(s) / ED Diagnoses Final diagnoses:  Symptomatic anemia  Hematuria, unspecified type  Ureteral tumor    Rx / DC Orders ED Discharge Orders     None         Pearline Cables,  Delora Fuel, DO 25/83/46 1625

## 2022-04-13 LAB — BPAM RBC
Blood Product Expiration Date: 202308292359
ISSUE DATE / TIME: 202307282052
Unit Type and Rh: 5100

## 2022-04-13 LAB — TYPE AND SCREEN
ABO/RH(D): O POS
Antibody Screen: NEGATIVE
Unit division: 0

## 2022-04-13 NOTE — ED Provider Notes (Signed)
  Physical Exam  BP (!) 170/79   Pulse 94   Temp 97.9 F (36.6 C) (Oral)   Resp 14   Ht '5\' 8"'$  (1.727 m)   Wt 82.6 kg   SpO2 100%   BMI 27.69 kg/m   Physical Exam Constitutional:      General: She is not in acute distress.    Appearance: She is well-developed.  HENT:     Head: Normocephalic and atraumatic.     Mouth/Throat:     Mouth: Mucous membranes are moist.  Eyes:     Pupils: Pupils are equal, round, and reactive to light.  Cardiovascular:     Rate and Rhythm: Normal rate and regular rhythm.     Heart sounds: No murmur heard. Pulmonary:     Effort: Pulmonary effort is normal. No respiratory distress.     Breath sounds: Normal breath sounds.  Abdominal:     General: Abdomen is flat.     Palpations: Abdomen is soft.     Tenderness: There is no abdominal tenderness.  Musculoskeletal:        General: No tenderness.     Right lower leg: No edema.     Left lower leg: No edema.  Skin:    General: Skin is warm and dry.  Neurological:     General: No focal deficit present.     Mental Status: She is alert. Mental status is at baseline.  Psychiatric:        Mood and Affect: Mood normal.        Behavior: Behavior normal.     Procedures  Procedures  ED Course / MDM    Medical Decision Making Amount and/or Complexity of Data Reviewed Labs: ordered.  Risk Prescription drug management.   76 year old female with history of renal cancer transferred from outside emergency department for blood transfusion in the setting of symptomatic anemia.  Patient received blood transfusion without complication or reaction.  Patient denies any other symptoms at this time. Will discharge patient to home. All questions answered. Patient comfortable with plan of discharge. Return precautions discussed with patient and specified on the after visit summary.        Cristie Hem, MD 04/13/22 [phone]

## 2022-04-29 ENCOUNTER — Ambulatory Visit: Payer: Medicare Other | Admitting: Gastroenterology

## 2022-05-06 IMAGING — CT CT NECK W/ CM
1 of 5 series · 2 of 14 positions shown, 3 images · IV contrast (iopamidol)
Comparison: Barium swallow 11/13/2017

CLINICAL DATA: Right neck swelling pain. Difficulty swallowing.
Symptoms over the last 3 months.

EXAM:
CT NECK WITH CONTRAST
TECHNIQUE: Multidetector CT imaging of the neck was performed using the
standard protocol following the bolus administration of intravenous
contrast.
CONTRAST:  75mL [id number] IOPAMIDOL ([id number]) INJECTION 61%

[Series 2: neck · axial · 0.47mm/px · z∈[-216,-134]mm · 2 of 123 slices shown, 3 images]
[im 41/123  soft-tissue]
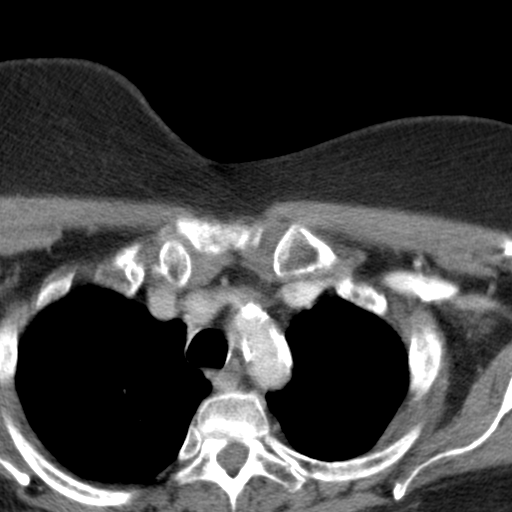
[im 41/123  bone]
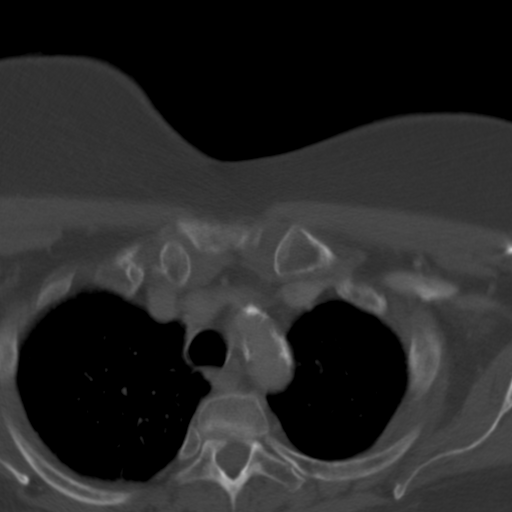
[im 82/123  bone]
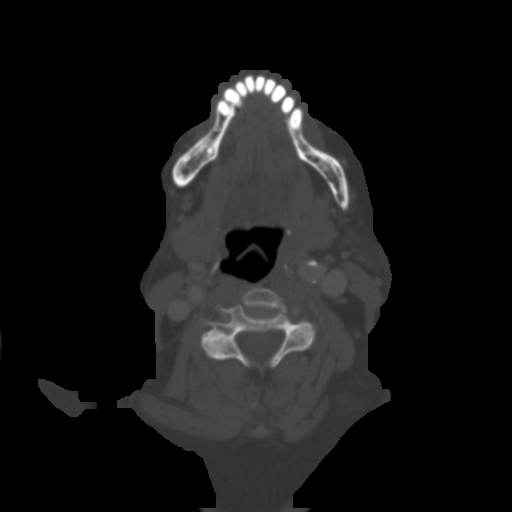

[2 of 14 positions shown; findings below may reference images not displayed]

FINDINGS: Pharynx and larynx: No mucosal or submucosal lesion.

Salivary glands: Parotid and submandibular glands are normal.

Thyroid: Normal. Right lobe is somewhat more superficially
positioned than the left, possibly up lifted by tortuous
brachiocephalic vessels.

Lymph nodes: No enlarged or low-density nodes on either side of the
neck.

Vascular: Atherosclerotic calcification at the carotid bifurcations.
Otherwise no vascular finding.

Limited intracranial: Negative

Visualized orbits: Negative

Mastoids and visualized paranasal sinuses: Clear

Skeleton: Ordinary osteoarthritis at the C1-2 articulation.

Upper chest: Emphysema and pulmonary scarring. Aortic
atherosclerosis. Coronary artery calcification.

Other: None
IMPRESSION: No abnormality seen to explain the clinical presentation. No
evidence of right neck mass or lymphadenopathy. Right lobe of the
thyroid is somewhat more superficially positioned than the left.

C1-2 osteoarthritis.

Aortic Atherosclerosis (OAV45-QMQ.Q) and Emphysema (OAV45-2MF.4).

Carotid bifurcation calcification.

## 2022-05-21 ENCOUNTER — Ambulatory Visit: Payer: Medicare Other | Admitting: Gastroenterology

## 2022-05-26 ENCOUNTER — Other Ambulatory Visit (HOSPITAL_COMMUNITY): Payer: Self-pay | Admitting: Internal Medicine

## 2022-08-06 ENCOUNTER — Telehealth: Payer: Self-pay | Admitting: *Deleted

## 2022-08-06 ENCOUNTER — Ambulatory Visit (INDEPENDENT_AMBULATORY_CARE_PROVIDER_SITE_OTHER): Payer: Medicare Other | Admitting: Internal Medicine

## 2022-08-06 ENCOUNTER — Encounter: Payer: Self-pay | Admitting: Internal Medicine

## 2022-08-06 VITALS — BP 158/79 | HR 60 | Temp 97.8°F | Ht 68.0 in | Wt 195.4 lb

## 2022-08-06 DIAGNOSIS — R142 Eructation: Secondary | ICD-10-CM | POA: Diagnosis not present

## 2022-08-06 MED ORDER — LINACLOTIDE 145 MCG PO CAPS: 145.0000 ug | ORAL_CAPSULE | Freq: Every day | ORAL | 11 refills | Status: AC

## 2022-08-06 NOTE — Progress Notes (Signed)
Primary Care Physician:  [name], MD Primary Gastroenterologist:  Dr. Gala Romney  Pre-Procedure History & Physical: HPI:  Debbie [name] is a 76 y.o. female with multiple co-morbidities including likely metastatic renal cell carcinoma; recent nephrectomy. Right ureteral and bladder involvement.  She is undergoing immunotherapy and iron infusions.   She is here for further evaluation of a 1 year history of intermittent nearly incessant belching.  Worse at night.  Not associate with eating has not had any nausea or vomiting has had some typical reflux symptoms with Dexilant 60 mg daily -  appears to be controlling those symptoms nicely.  Historically, poorly controlled constipation on MiraLAX and Colace.  We started Linzess 145 earlier in the year with excellent improvement in bowel function.  However, she ran out and did not call us back  - may go a couple of days without a bowel movement on occasion.  No rectal bleeding or melena. Recent labs revealed a hemoglobin of 10.1.  LFTs all good.  Cortisol somewhat depressed at 5.6.  Small adenoma removed from her colon back in 2013.  She is going back on her Eliquis today after postop holiday.  Reglan was added to her regimen recently without much improvement.  She does not have any abdominal pain.  No difficulty consuming a meal.  Complains of some pill and food dysphagia, pointing to her suprasternal notch.  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  EGD was normal as far so esophageal findings were concerned. Past Medical History:  Diagnosis Date   A-fib ([place]) 11/15/2019   Abnormal liver function    Adenomatous polyp 12/04/2006   AKI (acute kidney injury) ([place])    Asthma    DM type 2 (diabetes mellitus, type 2) ([place])    GERD (gastroesophageal reflux disease) 02/28/2012   Hemorrhoid 12/04/2006   Hyperlipidemia    Hypertension    Hypomagnesemia    Hypotension 09/21/2019    Peripheral arterial disease ([place])    Persistent atrial fibrillation ([place]) 11/15/2019   Pulmonary hypertension ([place]) 03/28/2017   Echo 04/04/17 Compared to a prior study in 2015,   there is now moderate LVH and the LVEF is higher at 65-70%. No   obvious PFO noted by saline microbubble contrast. There is   moderate TR with an RVSP of 56 mmHg and a normal, collapsing IVC.   Consistent with moderate pulmonary hypertension.   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 Delight Ovens, adenomatous polyp   COLONOSCOPY  03/25/2012   Procedure: COLONOSCOPY;  Surgeon: Daneil Dolin, MD;  [facility] ENDO [facility];  Service: Endoscopy;  Laterality: N/A;  10:30   ESOPHAGOGASTRODUODENOSCOPY  11/03/2002   Dr. Gala Romney- normal exam- was done to check for possible foreign body   Fiberoptic bronchoscopy with endobronchial  ultrasound  10/22/2010   [place]   Right BKA  1990   RIGHT HEART CATHETERIZATION N/A 06/22/2014   Procedure: RIGHT HEART CATH;  Surgeon: Larey Dresser, MD;  Location: [facility] CATH LAB;  Service: Cardiovascular;  Laterality: N/A;    Prior to Admission medications   Medication Sig Start Date End Date Taking? Authorizing Provider  acetaminophen (TYLENOL) 500 MG tablet Take 500 mg by mouth every 6 (six) hours as needed for mild pain (or headaches).   Yes [provider]  BD PEN NEEDLE NANO U/F 32G X 4 MM MISC 2 (two) times daily. as directed 02/26/15  Yes [provider]  Calcium Carb-Cholecalciferol (CALCIUM 1000 + D PO) Take 1,000 mg by mouth daily.   Yes [provider]  Coenzyme  Q10 400 MG CAPS Take 400 mg by mouth daily.    Yes [provider]  dexlansoprazole (DEXILANT) 60 MG capsule Take 1 capsule (60 mg total) by mouth daily. 09/29/15  Yes Tanda Rockers, MD  diltiazem (CARDIZEM CD) 240 MG 24 hr capsule Take 1 capsule (240 mg total) by mouth daily. 09/25/19  Yes Florencia Reasons, MD  docusate sodium (COLACE) 100 MG capsule Take 100 mg by mouth 2 (two) times daily.   Yes [provider]  dofetilide (TIKOSYN) 250 MCG capsule TAKE 1 CAPSULE BY MOUTH TWICE A DAY Patient taking differently: Take 250 mcg by mouth 2 (two) times daily. 10/05/21  Yes Evans Lance, MD  donepezil (ARICEPT) 10 MG tablet Take by mouth. 10/26/21  Yes [provider]  estradiol (ESTRACE) 0.1 MG/GM vaginal cream Place 1 Applicatorful vaginally 3 (three) times a week. 04/19/21  Yes [provider]  ezetimibe (ZETIA) 10 MG tablet Take 10 mg by mouth every evening.  08/27/19  Yes [provider]  furosemide (LASIX) 40 MG tablet Take 2 tablets (80 mg total) by mouth daily. 09/04/21 08/06/22 Yes TillerySatira Mccallum, PA-C  gabapentin (NEURONTIN) 100 MG capsule Take 300 mg by mouth daily. Take 200 mg in the morning and 100 mg at night   Yes [provider]  glucosamine-chondroitin 500-400 MG tablet Take 1 tablet by mouth every morning.   Yes [provider]  halobetasol (ULTRAVATE) 0.05 % cream Apply 1 application topically 2 (two) times daily as needed (psoriasis).  08/15/16  Yes [provider]  ibandronate (BONIVA) 150 MG tablet Take 150 mg by mouth every 30 (thirty) days. 02/15/20  Yes [provider]  Insulin Glargine (BASAGLAR KWIKPEN) 100 UNIT/ML SOPN Inject 45 Units into the skin daily before breakfast.   Yes [provider]  Insulin Pen Needle 32G X 4 MM MISC USE TWICE DAILY AS DIRECTED 05/31/15  Yes [provider]  JANUVIA 100 MG tablet Take 100 mg by mouth daily. 04/05/21  Yes [provider]   ketotifen (ZADITOR) 0.025 % ophthalmic solution Place 1 drop into both eyes daily as needed (for irritation).   Yes [provider]  Lidocaine-Glycerin (PREPARATION H EX) Place 1 application rectally 2 (two) times daily as needed (for pain).   Yes [provider]  losartan (COZAAR) 100 MG tablet TAKE 1 TABLET BY MOUTH EVERY DAY 05/28/22  Yes Evans Lance, MD  magnesium oxide (MAG-OX) 400 (241.3 Mg) MG tablet Take 1 tablet (400 mg total) by mouth 2 (two) times daily. Please make yearly appt with Dr. [name] for April 2022 for future refills. Thank you 1st attempt 10/18/20  Yes Evans Lance, MD  methenamine (HIPREX) 1 g tablet Take 1 g by mouth 2 (two) times daily. 05/17/21  Yes [provider]  ondansetron (ZOFRAN-ODT) 4 MG disintegrating tablet Take 4 mg by mouth every 8 (eight)  hours as needed for nausea or vomiting.   Yes [provider]  oxybutynin (DITROPAN) 5 MG tablet Take by mouth. 07/29/22  Yes [provider]  OXYGEN Inhale 3 L/min into the lungs continuous.   Yes [provider]  polyethylene glycol (MIRALAX / GLYCOLAX) 17 g packet Take 17 g by mouth daily.   Yes [provider]  potassium chloride SA (KLOR-CON M) 20 MEQ tablet Take 1 tablet (20 mEq total) by mouth 3 (three) times a week. 08/20/21  Yes Eulogio Bear U, DO  tamsulosin (FLOMAX) 0.4 MG CAPS capsule Take 0.4 mg by mouth daily. 10/29/21  Yes [provider]  tiZANidine (ZANAFLEX) 4 MG tablet Take 4 mg by mouth at bedtime. 11/04/19  Yes [provider]  VOLTAREN 1 % GEL Apply 2 g topically 4 (four) times daily as needed (for pain). 08/13/17  Yes [provider]  apixaban (ELIQUIS) 5 MG TABS tablet Take 1 tablet (5 mg total) by mouth 2 (two) times daily. Patient not taking: Reported on 08/06/2022 05/03/22   Evans Lance, MD  metoCLOPramide (REGLAN) 5 MG tablet Take 5 mg by mouth. Patient not taking: Reported on 08/06/2022 07/25/22   [provider]    Allergies as of 08/06/2022 - Review Complete 08/06/2022  Allergen Reaction Noted   Codeine Itching 07/16/2022   Meloxicam Other (See Comments) 10/18/2010   Rofecoxib Other (See Comments) 11/05/2017    Family History  Problem Relation Age of Onset   CVA Mother 12   Heart disease Mother    Diabetes Mother 31   Lung cancer Father        lung carcinoma   COPD Sister    COPD Sister    Diabetes Sister    Breast cancer Sister        Mastectomy   Cancer Maternal Grandmother    Colon cancer Neg Hx     Social History   Socioeconomic History   Marital status: Married    Spouse name: Freida Nebel   Number of children: 3   Years of education: Not on file   Highest education level: Not on file  Occupational History   Occupation: retired    Fish farm manager: UNEMPLOYED  Tobacco Use   Smoking status: Former    Packs/day: 0.50    Years: 18.00    Total pack years: 9.00    Types: Cigarettes    Quit date: 09/16/1990    Years since quitting: 31.9   Smokeless tobacco: Never  Vaping Use   Vaping Use: Never used  Substance and Sexual Activity   Alcohol use: No    Alcohol/week: 0.0 standard drinks of alcohol   Drug use: No   Sexual activity: Not Currently  Other Topics Concern   Not on file  Social History Narrative   Not on file   Social Determinants of Health   Financial Resource Strain: Not on file  Food Insecurity: Not on file  Transportation Needs: Not on file  Physical Activity: Not on file  Stress: Not on file  Social Connections: Not on file  Intimate Partner Violence: Not on file    Review of Systems: See HPI, otherwise negative ROS  Physical Exam: BP (!) 163/110 (BP Location: Right Arm, Patient Position: Sitting, Cuff Size: Large)   Pulse 60   Temp 97.8 F (36.6 C) (Oral)   Ht '5\' 8"'$  (1.727 m)   Wt 195 lb 6.4 oz (88.6 kg)   SpO2 97%   BMI 29.71 kg/m  General: chronically ill lady with nasal O2 in place.  Has some difficulty speaking in  complete sentences but is in no acute distress. Eyes:  Sclera clear, no icterus.   Conjunctiva pink. Lungs:  Clear throughout to auscultation.   No wheezes, crackles, or rhonchi. No acute distress. Heart:  Regular rate and rhythm; no murmurs, clicks, rubs,  or gallops. Abdomen: Non-distended, normal bowel sounds.  Soft and nontender without appreciable mass or hepatosplenomegaly.  Pulses:  Normal pulses noted. Extremities:  Without clubbing or edema.  Impression/Plan: Pleasant 76 year old lady with recently diagnosed right renal cell carcinoma with apparent local extension status post right nephrectomy.  Presents with a 1 year history of incessant belching.  Has a history of GERD and describes chronic intermittent esophageal dysphagia.  Need to be concerned about a structural lesion in her esophagus as well as a hernia (paraesophageal type, except for) in evolution.  Catching and esophageal dysphagia symptoms may or may not be related.  Cannot rule out rumination syndrome at this time.  Gallbladder is long gone.  Constipation continues to be an issue.  Responded to [place].  Needs to continue on with that treatment.  Changes of cirrhosis on recent cross-sectional imaging.  She has a very good preserved hepatic synthetic function.   Recommendations  We will resume Linzess 145  -  1 capsule daily for management of constipation (dispense 30 with 11 refills)  May not need Colace while taking Linzess  Evaluate belching  - we will proceed with a barium pill esophagram/upper GI series to look for occult hernias, etc.  Continue Dexilant 60 mg daily 30 minutes before breakfast  recent CT did find some changes or scarring in the  liver consistent with cirrhosis.  That likely has nothing to do with other medical problems.  That condition does not need an extensive evaluation at this time with other ongoing medical problems  Further recommendations to follow.       Notice: This dictation  was prepared with Dragon dictation along with smaller phrase technology. Any transcriptional errors that result from this process are unintentional and may not be corrected upon review.

## 2022-08-06 NOTE — Patient Instructions (Signed)
It was nice to see you again today!  We will resume Linzess 145 1 capsule daily for management of constipation dispense 30 with 11 refills)  May not need Colace while taking Linzess  Evaluate your belching we will proceed with a barium pill esophagram/upper GI series to look for occult hernias, etc.  Continue Dexilant 60 mg daily 30 minutes before breakfast  As discussed recent CT did find some changes or scarring in your liver consistent with cirrhosis.  That likely has nothing to do with your other medical problems.  That condition does not need an extensive evaluation at this time with your other ongoing medical problems  Further recommendations to follow.

## 2022-08-06 NOTE — Telephone Encounter (Signed)
Pt informed of procedure on 08/12/22 at 10:30 am, arrive at 9:45 am to check in, nothing to eat or drink after midnight. Verbalized understanding

## 2022-08-12 ENCOUNTER — Ambulatory Visit (HOSPITAL_COMMUNITY)
Admission: RE | Admit: 2022-08-12 | Discharge: 2022-08-12 | Disposition: A | Discharge status: Home / Self Care | Payer: Medicare Other | Source: Ambulatory Visit | Attending: Internal Medicine | Admitting: Internal Medicine

## 2022-08-12 ENCOUNTER — Other Ambulatory Visit: Payer: Self-pay | Admitting: Internal Medicine

## 2022-08-12 ENCOUNTER — Other Ambulatory Visit (HOSPITAL_COMMUNITY): Payer: Medicare Other

## 2022-08-12 ENCOUNTER — Ambulatory Visit (HOSPITAL_COMMUNITY): Payer: Medicare Other

## 2022-08-12 DIAGNOSIS — R142 Eructation: Secondary | ICD-10-CM | POA: Insufficient documentation

## 2022-08-22 ENCOUNTER — Other Ambulatory Visit: Payer: Self-pay

## 2022-08-22 MED ORDER — DOFETILIDE 250 MCG PO CAPS: 250.0000 ug | ORAL_CAPSULE | Freq: Two times a day (BID) | ORAL | 3 refills | Status: AC

## 2022-09-24 ENCOUNTER — Encounter: Payer: Self-pay | Admitting: Internal Medicine

## 2022-09-24 ENCOUNTER — Ambulatory Visit (INDEPENDENT_AMBULATORY_CARE_PROVIDER_SITE_OTHER): Payer: Medicare Other | Admitting: Internal Medicine

## 2022-09-24 VITALS — BP 151/84 | HR 62 | Temp 97.6°F | Ht 68.0 in | Wt 193.0 lb

## 2022-09-24 DIAGNOSIS — K219 Gastro-esophageal reflux disease without esophagitis: Secondary | ICD-10-CM

## 2022-09-24 DIAGNOSIS — K5909 Other constipation: Secondary | ICD-10-CM

## 2022-09-24 NOTE — Progress Notes (Signed)
Primary Care Physician:  [name], MD Primary Gastroenterologist:  Dr. Gala Romney  Pre-Procedure History & Physical: HPI:  Debbie [name] is a 77 y.o. female here for above incessant belching.  History of metastatic renal/urothelial noma on chemotherapy bladder washes as well as immunotherapy.  Typical GERD symptoms well-controlled on Dexilant 60 mg daily.  Constipation well-controlled on Linzess 145.  Patient's complaint is incessant belching.  Upper GI series since her last visit revealed no hernia or other abnormality which would explain her symptoms.  She never brings up food -  not associate with heartburn.  Symptoms worse at night. May have a cirrhotic liver on imaging but continues to have very good hepatic synthetic function.  No evidence of portal hypertension.  She is on Eliquis for atrial fibrillation.  Only down 2 pounds since she was last seen in our office in November.  Stopped Reglan at my direction without any change in symptoms (medication did not help).  Past Medical History:  Diagnosis Date   A-fib ([place]) 11/15/2019   Abnormal liver function    Adenomatous polyp 12/04/2006   AKI (acute kidney injury) ([place])    Asthma    DM type 2 (diabetes mellitus, type 2) ([place])    GERD (gastroesophageal reflux disease) 02/28/2012   Hemorrhoid 12/04/2006   Hyperlipidemia    Hypertension    Hypomagnesemia    Hypotension 09/21/2019   Peripheral arterial disease ([place])    Persistent atrial fibrillation ([place]) 11/15/2019   Pulmonary hypertension ([place]) 03/28/2017   Echo 04/04/17 Compared to a prior study in 2015,   there is now moderate LVH and the LVEF is higher at 65-70%. No   obvious PFO noted by saline microbubble contrast. There is   moderate TR with an RVSP of 56 mmHg and a normal, collapsing IVC.   Consistent with moderate pulmonary hypertension.   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 Delight Ovens, adenomatous polyp   COLONOSCOPY  03/25/2012   Procedure: COLONOSCOPY;  Surgeon: Daneil Dolin, MD;  [facility]: [facility] ENDO [facility];  Service: Endoscopy;  Laterality: N/A;  10:30   ESOPHAGOGASTRODUODENOSCOPY  11/03/2002   Dr. Gala Romney- normal exam- was done to check for possible foreign body   Fiberoptic bronchoscopy with endobronchial  ultrasound  10/22/2010   [place]   Right BKA  1990   RIGHT HEART CATHETERIZATION N/A 06/22/2014   Procedure: RIGHT HEART CATH;  Surgeon: Larey Dresser, MD;  Location: [facility] CATH LAB;  Service: Cardiovascular;  Laterality: N/A;    Prior to Admission medications   Medication Sig Start Date End Date Taking? Authorizing Provider  acetaminophen (TYLENOL) 500 MG tablet Take 500 mg by mouth every 6 (six) hours as needed for mild pain (or headaches).   Yes [provider]  apixaban (ELIQUIS) 5 MG TABS tablet Take 1 tablet (5 mg total) by mouth 2 (two) times daily. 05/03/22  Yes Evans Lance, MD  BD PEN NEEDLE NANO U/F 32G X 4 MM MISC 2 (two) times daily. as directed 02/26/15  Yes [provider]  Calcium Carb-Cholecalciferol (CALCIUM 1000 + D PO) Take 1,000 mg by mouth daily.   Yes [provider]  Coenzyme Q10 400 MG CAPS Take 400 mg by mouth daily.    Yes [provider]  dexlansoprazole (DEXILANT) 60 MG capsule Take 1 capsule (60 mg total) by mouth daily. 09/29/15  Yes Tanda Rockers, MD  diltiazem (CARDIZEM CD) 240 MG 24 hr capsule Take 1 capsule (240 mg total) by mouth daily. 09/25/19  Yes Florencia Reasons, MD  docusate sodium (COLACE) 100 MG capsule Take 100 mg by mouth 2 (two) times daily.   Yes [provider]   dofetilide (TIKOSYN) 250 MCG capsule Take 1 capsule (250 mcg total) by mouth 2 (two) times daily. 08/22/22  Yes Evans Lance, MD  donepezil (ARICEPT) 10 MG tablet Take by mouth. 10/26/21  Yes [provider]  estradiol (ESTRACE) 0.1 MG/GM vaginal cream Place 1 Applicatorful vaginally 3 (three) times a week. 04/19/21  Yes [provider]  ezetimibe (ZETIA) 10 MG tablet Take 10 mg by mouth every evening.  08/27/19  Yes [provider]  furosemide (LASIX) 40 MG tablet Take 2 tablets (80 mg total) by mouth daily. 09/04/21 09/24/22 Yes TillerySatira Mccallum, PA-C  gabapentin (NEURONTIN) 100 MG capsule Take 300 mg by mouth daily. Take 200 mg in the morning and 100 mg at night   Yes [provider]  glucosamine-chondroitin 500-400 MG tablet Take 1 tablet by mouth every morning.   Yes [provider]  halobetasol (ULTRAVATE) 0.05 % cream Apply 1 application topically 2 (two) times daily as needed (psoriasis).  08/15/16  Yes [provider]  ibandronate (BONIVA) 150 MG tablet Take 150 mg by mouth every 30 (thirty) days. 02/15/20  Yes [provider]  Insulin Glargine (BASAGLAR KWIKPEN) 100 UNIT/ML SOPN Inject 45 Units into the skin daily before breakfast.   Yes [provider]  Insulin Pen Needle 32G X 4 MM MISC USE TWICE DAILY AS DIRECTED 05/31/15  Yes [provider]  JANUVIA 100 MG tablet Take 100 mg by mouth daily. 04/05/21  Yes [provider]  ketotifen (ZADITOR) 0.025 % ophthalmic solution Place 1 drop into both eyes daily as needed (for irritation).   Yes [provider]  Lidocaine-Glycerin (PREPARATION H EX) Place 1 application rectally 2 (two) times daily as needed (for pain).   Yes [provider]  linaclotide Rolan Lipa) 145 MCG CAPS capsule Take 1 capsule (145 mcg total) by mouth daily before breakfast. 08/06/22  Yes Mouhamed Glassco, Cristopher Estimable, MD  losartan (COZAAR) 100 MG tablet TAKE 1 TABLET BY MOUTH  EVERY DAY 05/28/22  Yes Evans Lance, MD  magnesium oxide (MAG-OX) 400 (241.3 Mg) MG tablet Take 1 tablet (400 mg total) by mouth 2 (two) times daily. Please make yearly appt with Dr. [name] for April 2022 for future refills. Thank you 1st attempt 10/18/20  Yes Evans Lance, MD  methenamine (HIPREX) 1 g tablet Take 1 g by mouth 2 (two) times daily. 05/17/21  Yes [provider]  MYRBETRIQ 25 MG TB24 tablet Take 25 mg by mouth daily. 04/14/22  Yes [provider]  ondansetron (ZOFRAN-ODT) 4 MG disintegrating tablet Take 4 mg by mouth every 8 (eight) hours as needed for nausea or vomiting.   Yes [provider]  OXYGEN Inhale 3 L/min into the lungs continuous.   Yes [provider]  polyethylene glycol (MIRALAX / GLYCOLAX) 17 g packet Take 17 g  by mouth daily.   Yes [provider]  potassium chloride SA (KLOR-CON M) 20 MEQ tablet Take 1 tablet (20 mEq total) by mouth 3 (three) times a week. 08/20/21  Yes Eulogio Bear U, DO  tamsulosin (FLOMAX) 0.4 MG CAPS capsule Take 0.4 mg by mouth daily. 10/29/21  Yes [provider]  tiZANidine (ZANAFLEX) 4 MG tablet Take 4 mg by mouth at bedtime. 11/04/19  Yes [provider]  VOLTAREN 1 % GEL Apply 2 g topically 4 (four) times daily as needed (for pain). 08/13/17  Yes [provider]    Allergies as of 09/24/2022 - Review Complete 09/24/2022  Allergen Reaction Noted   Codeine Itching 07/16/2022   Meloxicam Other (See Comments) 10/18/2010   Rofecoxib Other (See Comments) 11/05/2017    Family History  Problem Relation Age of Onset   CVA Mother 71   Heart disease Mother    Diabetes Mother 6   Lung cancer Father        lung carcinoma   COPD Sister    COPD Sister    Diabetes Sister    Breast cancer Sister        Mastectomy   Cancer Maternal Grandmother    Colon cancer Neg Hx     Social History   Socioeconomic History   Marital status: Married    Spouse name: Deziree Mokry    Number of children: 3   Years of education: Not on file   Highest education level: Not on file  Occupational History   Occupation: retired    Fish farm manager: UNEMPLOYED  Tobacco Use   Smoking status: Former    Packs/day: 0.50    Years: 18.00    Total pack years: 9.00    Types: Cigarettes    Quit date: 09/16/1990    Years since quitting: 32.0   Smokeless tobacco: Never  Vaping Use   Vaping Use: Never used  Substance and Sexual Activity   Alcohol use: No    Alcohol/week: 0.0 standard drinks of alcohol   Drug use: No   Sexual activity: Not Currently  Other Topics Concern   Not on file  Social History Narrative   Not on file   Social Determinants of Health   Financial Resource Strain: Not on file  Food Insecurity: Not on file  Transportation Needs: Not on file  Physical Activity: Not on file  Stress: Not on file  Social Connections: Not on file  Intimate Partner Violence: Not on file    Review of Systems: See HPI, otherwise negative ROS  Physical Exam: BP (!) 188/78 (BP Location: Right Arm, Patient Position: Sitting, Cuff Size: Large)   Pulse 85   Temp 97.6 F (36.4 C) (Oral)   Ht '5\' 8"'$  (1.727 m)   Wt 193 lb (87.5 kg)   SpO2 95%   BMI 29.35 kg/m  General:   Alert, chronically ill-appearing pleasant and cooperative in NAD Skin:  Intact without significant lesions or rashes. Neck:  Supple; no masses or thyromegaly. No significant cervical adenopathy. Lungs:  Clear throughout to auscultation.   No wheezes, crackles, or rhonchi. No acute distress. Heart:  Regular rate and rhythm; no murmurs, clicks, rubs,  or gallops. Abdomen: Non-distended, normal bowel sounds.  Soft and nontender without appreciable mass or hepatosplenomegaly.   Impression/Plan: 77 year old lady with metastatic renal/ urothelial cancer.  GERD well-controlled on PPI.  history of constipation well-controlled on Linzess.  Incessant belching.  Worse at night.  She is not bringing up any food.  And the  belches or not consistent with pyrosis.  This does not clearly amount to rumination syndrome.  There could be a functional component.  High-resolution manometry may be helpful.  This would be difficult for this lady to undergo  Might consider a course of baclofen.  However, would like to keep her regimen as simple as possible.  Her quality of life is suffering due to the belching, however.  Recommendations: Continue Linzess.  Continue Dexilant.  [place]/bloat diet provided  Take Gas-X (or similar over-the-counter simethicone containing preparation) after supper every evening  Symptoms of belching are a clinical challenge.  Before adding a prescription medication to your regimen lets see how the above measures do for the short run.  Consider a trial of nightly  baclofen in the future  Please call me in 2 weeks and let me know how you are doing.  Office visit in 2 months.                          Notice: This dictation was prepared with Dragon dictation along with smaller phrase technology. Any transcriptional errors that result from this process are unintentional and may not be corrected upon review.

## 2022-09-24 NOTE — Patient Instructions (Signed)
It was good to see you again today  Continue Linzess.  Continue Dexilant.  [place]/bloat diet provided  Take Gas-X (or similar over-the-counter simethicone containing preparation) after supper every evening  Symptoms of belching are a clinical challenge.  Before adding a prescription medication to your regimen lets see how the above measures do for the short run.  Please call me in 2 weeks and let me know how you are doing.  Office visit in 2 months.

## 2022-10-24 ENCOUNTER — Encounter (HOSPITAL_COMMUNITY): Payer: Self-pay | Admitting: *Deleted

## 2022-11-19 ENCOUNTER — Encounter: Payer: Self-pay | Admitting: Internal Medicine

## 2022-11-19 ENCOUNTER — Ambulatory Visit (INDEPENDENT_AMBULATORY_CARE_PROVIDER_SITE_OTHER): Payer: Medicare Other | Admitting: Internal Medicine

## 2022-11-19 VITALS — BP 166/87 | HR 76 | Temp 98.0°F | Ht 68.0 in | Wt 199.0 lb

## 2022-11-19 DIAGNOSIS — K5909 Other constipation: Secondary | ICD-10-CM

## 2022-11-19 DIAGNOSIS — R142 Eructation: Secondary | ICD-10-CM | POA: Diagnosis not present

## 2022-11-19 DIAGNOSIS — K219 Gastro-esophageal reflux disease without esophagitis: Secondary | ICD-10-CM

## 2022-11-19 MED ORDER — PANTOPRAZOLE SODIUM 40 MG PO TBEC: 40.0000 mg | DELAYED_RELEASE_TABLET | Freq: Two times a day (BID) | ORAL | 11 refills | Status: AC

## 2022-11-19 NOTE — Progress Notes (Unsigned)
Primary Care Physician:  [name], MD Primary Gastroenterologist:  Dr. Gala Romney  Pre-Procedure History & Physical: HPI:  Debbie [name] is a 77 y.o. female here for follow-up of reflux.  Has issues with reflux because insurance will never no longer pay for Dexilant.  It was prescribed originally by her PCP.  Pharmacy said continuing on [place] will cost her $800 a month.  They reached out to PCP reportedly but no alternative suggested.  No dysphagia.  Linzess 145 daily is just right for her bowel function moves her bowels daily very happy with that still has some belching.  She still able to eat.  She has advanced pulmonary hypertension.  Denies abdominal pain.  Locally metastatic bladder cancer she is getting bladder washes.  Recent PET/CT through [facility] revealed residual bladder lesions and a right hilar nodule found for which further evaluation recommended.  Continues to be anticoagulated with Eliquis.  Past Medical History:  Diagnosis Date   A-fib ([place]) 11/15/2019   Abnormal liver function    Adenomatous polyp 12/04/2006   AKI (acute kidney injury) ([place])    Asthma    DM type 2 (diabetes mellitus, type 2) ([place])    GERD (gastroesophageal reflux disease) 02/28/2012   Hemorrhoid 12/04/2006   Hyperlipidemia    Hypertension    Hypomagnesemia    Hypotension 09/21/2019   Peripheral arterial disease ([place])    Persistent atrial fibrillation ([place]) 11/15/2019   Pulmonary hypertension ([place]) 03/28/2017   Echo 04/04/17 Compared to a prior study in 2015,   there is now moderate LVH and the LVEF is higher at 65-70%. No   obvious PFO noted by saline microbubble contrast. There is   moderate TR with an RVSP of 56 mmHg and a normal, collapsing IVC.   Consistent with moderate pulmonary hypertension.   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 Delight Ovens, adenomatous polyp   COLONOSCOPY  03/25/2012   Procedure: COLONOSCOPY;  Surgeon: Daneil Dolin, MD;  Location: [facility] ENDO SUITE;  Service: Endoscopy;  Laterality: N/A;  10:30   ESOPHAGOGASTRODUODENOSCOPY  11/03/2002   Dr. Gala Romney- normal exam- was done to check for possible foreign body   Fiberoptic bronchoscopy with endobronchial  ultrasound  10/22/2010   [place]   Right BKA  1990   RIGHT HEART CATHETERIZATION N/A 06/22/2014   Procedure: RIGHT HEART CATH;  Surgeon: Larey Dresser, MD;  Location: [facility] CATH LAB;  Service: Cardiovascular;  Laterality: N/A;    Prior to Admission medications   Medication Sig Start Date End Date Taking? Authorizing Provider  acetaminophen (TYLENOL) 500 MG tablet Take 500 mg by mouth every 6 (six) hours as needed for mild pain (or headaches).   Yes [provider]  apixaban (ELIQUIS) 5 MG TABS tablet Take 1 tablet (5 mg total) by mouth 2 (two) times daily. 05/03/22  Yes Evans Lance, MD  BD PEN NEEDLE NANO U/F 32G X 4 MM MISC 2 (two) times daily. as directed 02/26/15  Yes [provider]  Calcium Carb-Cholecalciferol (CALCIUM  1000 + D PO) Take 1,000 mg by mouth daily.   Yes [provider]  Coenzyme Q10 400 MG CAPS Take 400 mg by mouth daily.    Yes [provider]  diltiazem (CARDIZEM CD) 240 MG 24 hr capsule Take 1 capsule (240 mg total) by mouth daily. 09/25/19  Yes Florencia Reasons, MD  docusate sodium (COLACE) 100 MG capsule Take 100 mg by mouth 2 (two) times daily.   Yes [provider]  dofetilide (TIKOSYN) 250 MCG capsule Take 1 capsule (250 mcg total) by mouth 2 (two) times daily. 08/22/22  Yes Evans Lance, MD  donepezil (ARICEPT) 10 MG tablet Take by mouth. 10/26/21   Yes [provider]  estradiol (ESTRACE) 0.1 MG/GM vaginal cream Place 1 Applicatorful vaginally 3 (three) times a week. 04/19/21  Yes [provider]  ezetimibe (ZETIA) 10 MG tablet Take 10 mg by mouth every evening.  08/27/19  Yes [provider]  furosemide (LASIX) 40 MG tablet Take 2 tablets (80 mg total) by mouth daily. 09/04/21 11/19/22 Yes TillerySatira Mccallum, PA-C  gabapentin (NEURONTIN) 100 MG capsule Take 300 mg by mouth daily. Take 200 mg in the morning and 100 mg at night   Yes [provider]  glucosamine-chondroitin 500-400 MG tablet Take 1 tablet by mouth every morning.   Yes [provider]  halobetasol (ULTRAVATE) 0.05 % cream Apply 1 application topically 2 (two) times daily as needed (psoriasis).  08/15/16  Yes [provider]  ibandronate (BONIVA) 150 MG tablet Take 150 mg by mouth every 30 (thirty) days. 02/15/20  Yes [provider]  Insulin Glargine (BASAGLAR KWIKPEN) 100 UNIT/ML SOPN Inject 45 Units into the skin daily before breakfast.   Yes [provider]  Insulin Pen Needle 32G X 4 MM MISC USE TWICE DAILY AS DIRECTED 05/31/15  Yes [provider]  JANUVIA 100 MG tablet Take 100 mg by mouth daily. 04/05/21  Yes [provider]  ketotifen (ZADITOR) 0.025 % ophthalmic solution Place 1 drop into both eyes daily as needed (for irritation).   Yes [provider]  Lidocaine-Glycerin (PREPARATION H EX) Place 1 application rectally 2 (two) times daily as needed (for pain).   Yes [provider]  linaclotide Rolan Lipa) 145 MCG CAPS capsule Take 1 capsule (145 mcg total) by mouth daily before breakfast. 08/06/22  Yes Jannice Beitzel, Cristopher Estimable, MD  losartan (COZAAR) 100 MG tablet TAKE 1 TABLET BY MOUTH EVERY DAY 05/28/22  Yes Evans Lance, MD  magnesium oxide (MAG-OX) 400 (241.3 Mg) MG tablet Take 1 tablet (400 mg total) by mouth 2 (two) times daily. Please make yearly appt with Dr. [name]  for April 2022 for future refills. Thank you 1st attempt 10/18/20  Yes Evans Lance, MD  methenamine (HIPREX) 1 g tablet Take 1 g by mouth 2 (two) times daily. 05/17/21  Yes [provider]  MYRBETRIQ 25 MG TB24 tablet Take 25 mg by mouth daily. 04/14/22  Yes [provider]  ondansetron (ZOFRAN-ODT) 4 MG disintegrating tablet Take 4 mg by mouth every 8 (eight) hours as needed for nausea or vomiting.   Yes [provider]  OXYGEN Inhale 3 L/min into the lungs continuous.   Yes [provider]  polyethylene glycol (MIRALAX / GLYCOLAX) 17 g packet Take 17 g by mouth daily.   Yes [provider]  potassium chloride SA (KLOR-CON M) 20 MEQ tablet Take 1 tablet (20 mEq total) by mouth 3 (three) times a  week. 08/20/21  Yes Eliseo Squires, Jessica U, DO  tamsulosin (FLOMAX) 0.4 MG CAPS capsule Take 0.4 mg by mouth daily. 10/29/21  Yes [provider]  tiZANidine (ZANAFLEX) 4 MG tablet Take 4 mg by mouth at bedtime. 11/04/19  Yes [provider]  VOLTAREN 1 % GEL Apply 2 g topically 4 (four) times daily as needed (for pain). 08/13/17  Yes [provider]  dexlansoprazole (DEXILANT) 60 MG capsule Take 1 capsule (60 mg total) by mouth daily. Patient not taking: Reported on 11/19/2022 09/29/15   Tanda Rockers, MD    Allergies as of 11/19/2022 - Review Complete 11/19/2022  Allergen Reaction Noted   Codeine Itching 07/16/2022   Meloxicam Other (See Comments) 10/18/2010   Rofecoxib Other (See Comments) 11/05/2017    Family History  Problem Relation Age of Onset   CVA Mother 32   Heart disease Mother    Diabetes Mother 50   Lung cancer Father        lung carcinoma   COPD Sister    COPD Sister    Diabetes Sister    Breast cancer Sister        Mastectomy   Cancer Maternal Grandmother    Colon cancer Neg Hx     Social History   Socioeconomic History   Marital status: Married    Spouse name: Tyrielle Vallas   Number of children: 3   Years  of education: Not on file   Highest education level: Not on file  Occupational History   Occupation: retired    Fish farm manager: UNEMPLOYED  Tobacco Use   Smoking status: Former    Packs/day: 0.50    Years: 18.00    Total pack years: 9.00    Types: Cigarettes    Quit date: 09/16/1990    Years since quitting: 32.1   Smokeless tobacco: Never  Vaping Use   Vaping Use: Never used  Substance and Sexual Activity   Alcohol use: No    Alcohol/week: 0.0 standard drinks of alcohol   Drug use: No   Sexual activity: Not Currently  Other Topics Concern   Not on file  Social History Narrative   Not on file   Social Determinants of Health   Financial Resource Strain: Not on file  Food Insecurity: Not on file  Transportation Needs: Not on file  Physical Activity: Not on file  Stress: Not on file  Social Connections: Not on file  Intimate Partner Violence: Not on file    Review of Systems: See HPI, otherwise negative ROS  Physical Exam: BP (!) 166/87 (BP Location: Right Arm, Patient Position: Sitting, Cuff Size: Large)   Pulse 76   Temp 98 F (36.7 C) (Oral)   Ht '5\' 8"'$  (1.727 m)   Wt 199 lb (90.3 kg)   SpO2 97%   BMI 30.26 kg/m  General:   Alert,   pleasant and cooperative in NAD; nasal O2 prongs in place Abdomen: Non-distended, normal bowel sounds.  Soft and nontender without appreciable mass or hepatosplenomegaly.  Pulses:  Normal pulses noted. Extremities:  Without clubbing or edema.  Impression/Plan: 77 year old lady with longstanding GERD.  Multiple comorbidities including atrial fibrillation pulmonary hypertension (O2 dependent) and locally metastatic bladder cancer.  Right hilar nodule noted on recent CT/PET. Belching seems to be more of an aggravation than anything else.  She needs to be back on acid suppression therapy.  Changes of cirrhosis seen on recent cross-sectional imaging.  She continues to have good hepatic synthetic function.  Favor  conservative course of management  as discussed with patient. Recommendations:   We will try a new prescription: Pantoprazole Protonix 40 mg twice daily (take 30 minutes before breakfast and supper) dispense 60 with 11 refills  Continue Linzess 145 daily  As discussed, you appear to have scarring or cirrhosis in your liver.  Your liver is functioning well.  We will simply follow things along without any extensive evaluation at this time  Blood pressure is elevated today.  It will be rechecked.  Plan to see you back in the office in 3 months.   Notice: This dictation was prepared with Dragon dictation along with smaller phrase technology. Any transcriptional errors that result from this process are unintentional and may not be corrected upon review.

## 2022-11-19 NOTE — Patient Instructions (Addendum)
Good to see you again today!  I am sorry you have been without Dexilant for so long.  We will try a new prescription: Pantoprazole Protonix 40 mg twice daily (take 30 minutes before breakfast and supper) dispense 60 with 11 refills  Continue Linzess 145 daily  As discussed, you appear to have scarring or cirrhosis in your liver.  Your liver is functioning well.  We will simply follow things along without any extensive evaluation at this time  Blood pressure is elevated today.  It will be rechecked.  Plan to see you back in the office in 3 months.

## 2022-11-19 NOTE — Progress Notes (Unsigned)
Primary Care Physician:  [name], MD Primary Gastroenterologist:  Dr. Gala Romney  Pre-Procedure History & Physical: HPI:  Debbie [name] is a 77 y.o. female here for   Past Medical History:  Diagnosis Date   A-fib ([place]) 11/15/2019   Abnormal liver function    Adenomatous polyp 12/04/2006   AKI (acute kidney injury) ([place])    Asthma    DM type 2 (diabetes mellitus, type 2) ([place])    GERD (gastroesophageal reflux disease) 02/28/2012   Hemorrhoid 12/04/2006   Hyperlipidemia    Hypertension    Hypomagnesemia    Hypotension 09/21/2019   Peripheral arterial disease ([place])    Persistent atrial fibrillation ([place]) 11/15/2019   Pulmonary hypertension ([place]) 03/28/2017   Echo 04/04/17 Compared to a prior study in 2015,   there is now moderate LVH and the LVEF is higher at 65-70%. No   obvious PFO noted by saline microbubble contrast. There is   moderate TR with an RVSP of 56 mmHg and a normal, collapsing IVC.   Consistent with moderate pulmonary hypertension.   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 Delight Ovens, adenomatous polyp   COLONOSCOPY  03/25/2012   Procedure: COLONOSCOPY;  Surgeon: Daneil Dolin, MD;  Location: [facility] ENDO SU[facility];  Service: Endoscopy;  Laterality: N/A;  10:30   ESOPHAGOGASTRODUODENOSCOPY  11/03/2002   Dr. Gala Romney- normal exam- was done to check for possible foreign body   Fiberoptic bronchoscopy with endobronchial  ultrasound  10/22/2010   [place]   Right BKA  1990   RIGHT HEART CATHETERIZATION N/A 06/22/2014   Procedure: RIGHT HEART CATH;  Surgeon: Larey Dresser,  MD;  Location: [facility] CATH LAB;  Service: Cardiovascular;  Laterality: N/A;    Prior to Admission medications   Medication Sig Start Date End Date Taking? Authorizing Provider  acetaminophen (TYLENOL) 500 MG tablet Take 500 mg by mouth every 6 (six) hours as needed for mild pain (or headaches).   Yes [provider]  apixaban (ELIQUIS) 5 MG TABS tablet Take 1 tablet (5 mg total) by mouth 2 (two) times daily. 05/03/22  Yes Evans Lance, MD  BD PEN NEEDLE NANO U/F 32G X 4 MM MISC 2 (two) times daily. as directed 02/26/15  Yes [provider]  Calcium Carb-Cholecalciferol (CALCIUM 1000 + D PO) Take 1,000 mg by mouth daily.   Yes [provider]  Coenzyme Q10 400 MG CAPS Take 400 mg by mouth daily.    Yes [provider]  diltiazem (CARDIZEM CD) 240 MG 24 hr capsule Take 1 capsule (240 mg total) by mouth daily. 09/25/19  Yes Florencia Reasons, MD  docusate sodium (COLACE) 100 MG capsule Take 100 mg by mouth 2 (two) times daily.   Yes [provider]  dofetilide (TIKOSYN) 250 MCG capsule Take 1 capsule (250 mcg total) by mouth 2 (two) times daily. 08/22/22  Yes Evans Lance, MD  donepezil (ARICEPT) 10 MG tablet Take by mouth. 10/26/21  Yes [provider]  estradiol (ESTRACE) 0.1 MG/GM  vaginal cream Place 1 Applicatorful vaginally 3 (three) times a week. 04/19/21  Yes [provider]  ezetimibe (ZETIA) 10 MG tablet Take 10 mg by mouth every evening.  08/27/19  Yes [provider]  furosemide (LASIX) 40 MG tablet Take 2 tablets (80 mg total) by mouth daily. 09/04/21 11/19/22 Yes TillerySatira Mccallum, PA-C  gabapentin (NEURONTIN) 100 MG capsule Take 300 mg by mouth daily. Take 200 mg in the morning and 100 mg at night   Yes [provider]  glucosamine-chondroitin 500-400 MG tablet Take 1 tablet by mouth every morning.   Yes [provider]  halobetasol (ULTRAVATE) 0.05 % cream Apply 1 application topically 2 (two) times daily as  needed (psoriasis).  08/15/16  Yes [provider]  ibandronate (BONIVA) 150 MG tablet Take 150 mg by mouth every 30 (thirty) days. 02/15/20  Yes [provider]  Insulin Glargine (BASAGLAR KWIKPEN) 100 UNIT/ML SOPN Inject 45 Units into the skin daily before breakfast.   Yes [provider]  Insulin Pen Needle 32G X 4 MM MISC USE TWICE DAILY AS DIRECTED 05/31/15  Yes [provider]  JANUVIA 100 MG tablet Take 100 mg by mouth daily. 04/05/21  Yes [provider]  ketotifen (ZADITOR) 0.025 % ophthalmic solution Place 1 drop into both eyes daily as needed (for irritation).   Yes [provider]  Lidocaine-Glycerin (PREPARATION H EX) Place 1 application rectally 2 (two) times daily as needed (for pain).   Yes [provider]  linaclotide Rolan Lipa) 145 MCG CAPS capsule Take 1 capsule (145 mcg total) by mouth daily before breakfast. 08/06/22  Yes Gearlene Godsil, Cristopher Estimable, MD  losartan (COZAAR) 100 MG tablet TAKE 1 TABLET BY MOUTH EVERY DAY 05/28/22  Yes Evans Lance, MD  magnesium oxide (MAG-OX) 400 (241.3 Mg) MG tablet Take 1 tablet (400 mg total) by mouth 2 (two) times daily. Please make yearly appt with Dr. [name] for April 2022 for future refills. Thank you 1st attempt 10/18/20  Yes Evans Lance, MD  methenamine (HIPREX) 1 g tablet Take 1 g by mouth 2 (two) times daily. 05/17/21  Yes [provider]  MYRBETRIQ 25 MG TB24 tablet Take 25 mg by mouth daily. 04/14/22  Yes [provider]  ondansetron (ZOFRAN-ODT) 4 MG disintegrating tablet Take 4 mg by mouth every 8 (eight) hours as needed for nausea or vomiting.   Yes [provider]  OXYGEN Inhale 3 L/min into the lungs continuous.   Yes [provider]  polyethylene glycol (MIRALAX / GLYCOLAX) 17 g packet Take 17 g by mouth daily.   Yes [provider]  potassium chloride SA (KLOR-CON M) 20 MEQ tablet Take 1 tablet (20 mEq total) by mouth 3 (three) times a  week. 08/20/21  Yes Eulogio Bear U, DO  tamsulosin (FLOMAX) 0.4 MG CAPS capsule Take 0.4 mg by mouth daily. 10/29/21  Yes [provider]  tiZANidine (ZANAFLEX) 4 MG tablet Take 4 mg by mouth at bedtime. 11/04/19  Yes [provider]  VOLTAREN 1 % GEL Apply 2 g topically 4 (four) times daily as needed (for pain). 08/13/17  Yes [provider]  dexlansoprazole (DEXILANT) 60 MG capsule Take 1 capsule (60 mg total) by mouth daily. Patient not taking: Reported on 11/19/2022 09/29/15   Tanda Rockers, MD    Allergies as of 11/19/2022 - Review Complete 11/19/2022  Allergen Reaction Noted   Codeine Itching 07/16/2022   Meloxicam Other (See Comments) 10/18/2010  Rofecoxib Other (See Comments) 11/05/2017    Family History  Problem Relation Age of Onset   CVA Mother 66   Heart disease Mother    Diabetes Mother 55   Lung cancer Father        lung carcinoma   COPD Sister    COPD Sister    Diabetes Sister    Breast cancer Sister        Mastectomy   Cancer Maternal Grandmother    Colon cancer Neg Hx     Social History   Socioeconomic History   Marital status: Married    Spouse name: Engineer, water   Number of children: 3   Years of education: Not on file   Highest education level: Not on file  Occupational History   Occupation: retired    Fish farm manager: UNEMPLOYED  Tobacco Use   Smoking status: Former    Packs/day: 0.50    Years: 18.00    Total pack years: 9.00    Types: Cigarettes    Quit date: 09/16/1990    Years since quitting: 32.1   Smokeless tobacco: Never  Vaping Use   Vaping Use: Never used  Substance and Sexual Activity   Alcohol use: No    Alcohol/week: 0.0 standard drinks of alcohol   Drug use: No   Sexual activity: Not Currently  Other Topics Concern   Not on file  Social History Narrative   Not on file   Social Determinants of Health   Financial Resource Strain: Not on file  Food Insecurity: Not on file  Transportation Needs: Not on  file  Physical Activity: Not on file  Stress: Not on file  Social Connections: Not on file  Intimate Partner Violence: Not on file    Review of Systems: See HPI, otherwise negative ROS  Physical Exam: BP (!) 154/87 (BP Location: Right Arm, Patient Position: Sitting, Cuff Size: Large)   Pulse 83   Temp 98 F (36.7 C) (Oral)   Ht '5\' 8"'$  (1.727 m)   Wt 199 lb (90.3 kg)   SpO2 97%   BMI 30.26 kg/m  General:   Alert,  Well-developed, well-nourished, pleasant and cooperative in NAD Skin:  Intact without significant lesions or rashes. Eyes:  Sclera clear, no icterus.   Conjunctiva pink. Ears:  Normal auditory acuity. Nose:  No deformity, discharge,  or lesions. Mouth:  No deformity or lesions. Neck:  Supple; no masses or thyromegaly. No significant cervical adenopathy. Lungs:  Clear throughout to auscultation.   No wheezes, crackles, or rhonchi. No acute distress. Heart:  Regular rate and rhythm; no murmurs, clicks, rubs,  or gallops. Abdomen: Non-distended, normal bowel sounds.  Soft and nontender without appreciable mass or hepatosplenomegaly.  Pulses:  Normal pulses noted. Extremities:  Without clubbing or edema.  Impression/Plan:  ***     Notice: This dictation was prepared with Dragon dictation along with smaller phrase technology. Any transcriptional errors that result from this process are unintentional and may not be corrected upon review.

## 2023-02-18 ENCOUNTER — Encounter: Payer: Self-pay | Admitting: Gastroenterology

## 2023-02-18 ENCOUNTER — Ambulatory Visit (INDEPENDENT_AMBULATORY_CARE_PROVIDER_SITE_OTHER): Payer: Medicare Other | Admitting: Gastroenterology

## 2023-02-18 ENCOUNTER — Ambulatory Visit: Payer: Medicare Other | Admitting: Internal Medicine

## 2023-02-18 VITALS — BP 152/78 | HR 60 | Temp 97.6°F | Ht 68.0 in | Wt 201.4 lb

## 2023-02-18 DIAGNOSIS — K5900 Constipation, unspecified: Secondary | ICD-10-CM | POA: Diagnosis not present

## 2023-02-18 DIAGNOSIS — K219 Gastro-esophageal reflux disease without esophagitis: Secondary | ICD-10-CM | POA: Diagnosis not present

## 2023-02-18 NOTE — Patient Instructions (Signed)
Try cutting back on your pantoprazole to once daily before breakfast. As long as your heartburn, indigestion are controlled you do not need twice daily dosing anymore.  Your belching may be worsened by your usage of oxygen. May sure to limit other activities that increase air in your stomach. Avoid straws, hard candies, chewing gum. Take your time when eating and drinking, chew food thoroughly. Also limit carbonated beverages.   Return to the office as needed or call with any questions or concerns.

## 2023-02-18 NOTE — Progress Notes (Signed)
GI Office Note    Referring Provider: Eartha Inch, MD Primary Care Physician:  Eartha Inch, MD  Primary Gastroenterologist: Roetta Sessions, MD   Chief Complaint   Chief Complaint  Patient presents with   Follow-up    History of Present Illness   Debbie [name] is a 77 y.o. female presenting today for follow up. Last seen in 11/2022.  History of reflux and constipation.  Other comorbidities including atrial fibrillation, pulmonary hypertension (O2 dependent), locally metastatic bladder cancer.  Right hilar nodule noted on recent CT/PET scan.  History of cirrhosis seen on recent cross-sectional imaging, good hepatic synthetic function, favor conservative course of management as previously discussed with patient.  At time of last office visit, complaining of aggravating belching.  Come off of Dexilant, no longer covered by insurance. Started on pantoprazole 40 mg twice daily at last office visit.    Today: Frequent belching, a lot of air. Uses oxygen 3L via [place]. No heartburn or regurgitation. Took 2-3 oxycodone per day but does not help her left/pelvic pain very much. Uses Tylenol predominantly. BMs regular with Linzess. No melena, brbpr. No abdominal pain.  CT chest abdomen pelvis with IV contrast February 17, 2023 1. Interval resolution of the previously described nodularity in the right posterior bladder with some residual thickening of the right posterior bladder wall.  2.  Mildly increased in size of the retroperitoneal lymph nodes and left axillary lymph nodes. This is a nonspecific finding and may be reactive.  3.  Stable 2.5 cm right hilar lymph node.  4.  Stable 6 mm nodule in the right upper lobe.  5.  Hepatic cirrhosis.   MRI pelvis without and with contrast 02/17/23: 1.  Multiple bone lesions involving the pelvis and proximal femur consistent with metastases with surrounding areas of soft tissue edema and enhancement possibly representing early associated soft  tissue mass/cortical breakthrough especially involving the left acetabular lesion.  2.  Probable pathologic fracture involving the junction of the anterior column and superior pubic ramus on the left.  3.  Severe degenerative disc and moderate degenerative facet change of the lower lumbar spine.  4.  Tear of the left superior acetabular labrum with degenerative changes of the labra bilaterally.  5.  Mass involving the right posterior aspect of the bladder.  6.  Fibroid uterus.   NM Bone Scan 02/17/23: Impression: multiple new bone metastases.    Current Outpatient Medications  Medication Sig Dispense Refill   acetaminophen (TYLENOL) 500 MG tablet Take 500 mg by mouth every 6 (six) hours as needed for mild pain (or headaches).     apixaban (ELIQUIS) 5 MG TABS tablet Take 1 tablet (5 mg total) by mouth 2 (two) times daily. 60 tablet 6   BD PEN NEEDLE NANO U/F 32G X 4 MM MISC 2 (two) times daily. as directed  6   Calcium Carb-Cholecalciferol (CALCIUM 1000 + D PO) Take 1,000 mg by mouth daily.     Coenzyme Q10 400 MG CAPS Take 400 mg by mouth daily.      diltiazem (CARDIZEM CD) 240 MG 24 hr capsule Take 1 capsule (240 mg total) by mouth daily. 30 capsule 0   docusate sodium (COLACE) 100 MG capsule Take 100 mg by mouth 2 (two) times daily.     dofetilide (TIKOSYN) 250 MCG capsule Take 1 capsule (250 mcg total) by mouth 2 (two) times daily. 180 capsule 3   donepezil (ARICEPT) 10 MG tablet Take by mouth.  estradiol (ESTRACE) 0.1 MG/GM vaginal cream Place 1 Applicatorful vaginally 3 (three) times a week.     ezetimibe (ZETIA) 10 MG tablet Take 10 mg by mouth every evening.      furosemide (LASIX) 40 MG tablet Take 2 tablets (80 mg total) by mouth daily. 180 tablet 3   gabapentin (NEURONTIN) 100 MG capsule Take 300 mg by mouth daily. Take 200 mg in the morning and 100 mg at night     glucosamine-chondroitin 500-400 MG tablet Take 1 tablet by mouth every morning.     halobetasol (ULTRAVATE) 0.05 %  cream Apply 1 application topically 2 (two) times daily as needed (psoriasis).      ibandronate (BONIVA) 150 MG tablet Take 150 mg by mouth every 30 (thirty) days.     Insulin Glargine (BASAGLAR KWIKPEN) 100 UNIT/ML SOPN Inject 45 Units into the skin daily before breakfast.     Insulin Pen Needle 32G X 4 MM MISC USE TWICE DAILY AS DIRECTED     JANUVIA 100 MG tablet Take 100 mg by mouth daily.     ketotifen (ZADITOR) 0.025 % ophthalmic solution Place 1 drop into both eyes daily as needed (for irritation).     Lidocaine-Glycerin (PREPARATION H EX) Place 1 application rectally 2 (two) times daily as needed (for pain).     linaclotide (LINZESS) 145 MCG CAPS capsule Take 1 capsule (145 mcg total) by mouth daily before breakfast. 30 capsule 11   losartan (COZAAR) 100 MG tablet TAKE 1 TABLET BY MOUTH EVERY DAY 90 tablet 3   magnesium oxide (MAG-OX) 400 (241.3 Mg) MG tablet Take 1 tablet (400 mg total) by mouth 2 (two) times daily. Please make yearly appt with Dr. Ladona Ridgel for April 2022 for future refills. Thank you 1st attempt 60 tablet 2   methenamine (HIPREX) 1 g tablet Take 1 g by mouth 2 (two) times daily.     MYRBETRIQ 25 MG TB24 tablet Take 25 mg by mouth daily.     ondansetron (ZOFRAN-ODT) 4 MG disintegrating tablet Take 4 mg by mouth every 8 (eight) hours as needed for nausea or vomiting.     oxyCODONE (OXY IR/ROXICODONE) 5 MG immediate release tablet Take by mouth.     OXYGEN Inhale 3 L/min into the lungs continuous.     pantoprazole (PROTONIX) 40 MG tablet Take 1 tablet (40 mg total) by mouth 2 (two) times daily. 60 tablet 11   polyethylene glycol (MIRALAX / GLYCOLAX) 17 g packet Take 17 g by mouth daily as needed.     potassium chloride SA (KLOR-CON M) 20 MEQ tablet Take 1 tablet (20 mEq total) by mouth 3 (three) times a week. 30 tablet 0   tamsulosin (FLOMAX) 0.4 MG CAPS capsule Take 0.4 mg by mouth daily.     tiZANidine (ZANAFLEX) 4 MG tablet Take 4 mg by mouth at bedtime.     VOLTAREN 1 %  GEL Apply 2 g topically 4 (four) times daily as needed (for pain).  2   No current facility-administered medications for this visit.    Allergies   Allergies as of 02/18/2023 - Review Complete 11/19/2022  Allergen Reaction Noted   Codeine Itching 07/16/2022   Meloxicam Other (See Comments) 10/18/2010   Rofecoxib Other (See Comments) 11/05/2017       Review of Systems   General: Negative for anorexia, weight loss, fever, chills, fatigue, +weakness. ENT: Negative for hoarseness, difficulty swallowing , nasal congestion. CV: Negative for chest pain, angina, palpitations, +dyspnea on exertion, peripheral  edema.  Respiratory: Negative for dyspnea at rest, +dyspnea on exertion, cough, sputum, wheezing.  GI: See history of present illness. GU:  Negative for dysuria, hematuria, urinary incontinence, urinary frequency, nocturnal urination.  Endo: Negative for unusual weight change.     Physical Exam   BP (!) 163/72 (BP Location: Right Arm, Patient Position: Sitting, Cuff Size: Normal)   Pulse 69   Temp 97.6 F (36.4 C) (Oral)   Ht 5\' 8"  (1.727 m)   Wt 201 lb 6.4 oz (91.4 kg)   SpO2 92%   BMI 30.62 kg/m    General: Well-nourished, well-developed in no acute distress.  Eyes: No icterus. Mouth: Oropharyngeal mucosa moist and pink   Abdomen: Bowel sounds are normal, nontender, nondistended, no hepatosplenomegaly or masses,  no abdominal bruits or hernia , no rebound or guarding.  Rectal: not performed Extremities: No left lower extremity edema. Right AKA. No clubbing or deformities. Neuro: Alert and oriented x 4   Skin: Warm and dry, no jaundice.   Psych: Alert and cooperative, normal mood and affect.  Labs   02/13/23: wbc 8500, Hgb 11.7, plt 294, cre 1.3,LFTs normal. Imaging Studies   No results found.  Assessment   GERD/belching: belching is still problematic, mostly a nuisance. May be exacerbated by oxygen use. No alarm symptoms.  Constipation: doing well on current  regimen.   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 To see hematology and urology this week.   The patient was found to have elevated blood pressure when vital signs were checked in the office. The blood pressure was rechecked by the nursing staff and it was found be persistently elevated >140/90 mmHg. I personally advised to the patient to follow up closely with his PCP for hypertension control.   PLAN   Try reducing pantoprazole to 40mg  daily if tolerated.  Reduce activities that increase ingestion of air/belching. Avoid straws, sucking on hard candies, chewing gum, carbonated beverages.  Return to the office as needed.   Leanna Battles. Melvyn Neth, MHS, PA-C [facility]

## 2023-02-21 ENCOUNTER — Encounter: Payer: Self-pay | Admitting: Gastroenterology

## 2023-02-27 ENCOUNTER — Other Ambulatory Visit: Payer: Self-pay

## 2023-02-27 ENCOUNTER — Emergency Department (HOSPITAL_BASED_OUTPATIENT_CLINIC_OR_DEPARTMENT_OTHER)
Admission: EM | Admit: 2023-02-27 | Discharge: 2023-02-27 | Disposition: A | Discharge status: Home / Self Care | Payer: Medicare Other | Attending: Emergency Medicine | Admitting: Emergency Medicine

## 2023-02-27 ENCOUNTER — Encounter (HOSPITAL_BASED_OUTPATIENT_CLINIC_OR_DEPARTMENT_OTHER): Payer: Self-pay | Admitting: Emergency Medicine

## 2023-02-27 DIAGNOSIS — J45909 Unspecified asthma, uncomplicated: Secondary | ICD-10-CM | POA: Insufficient documentation

## 2023-02-27 DIAGNOSIS — K5641 Fecal impaction: Secondary | ICD-10-CM | POA: Insufficient documentation

## 2023-02-27 DIAGNOSIS — Z79899 Other long term (current) drug therapy: Secondary | ICD-10-CM | POA: Diagnosis not present

## 2023-02-27 DIAGNOSIS — E119 Type 2 diabetes mellitus without complications: Secondary | ICD-10-CM | POA: Diagnosis not present

## 2023-02-27 DIAGNOSIS — I10 Essential (primary) hypertension: Secondary | ICD-10-CM | POA: Insufficient documentation

## 2023-02-27 DIAGNOSIS — R600 Localized edema: Secondary | ICD-10-CM | POA: Diagnosis not present

## 2023-02-27 DIAGNOSIS — R1030 Lower abdominal pain, unspecified: Secondary | ICD-10-CM | POA: Diagnosis present

## 2023-02-27 DIAGNOSIS — Z89511 Acquired absence of right leg below knee: Secondary | ICD-10-CM | POA: Diagnosis not present

## 2023-02-27 DIAGNOSIS — Z87891 Personal history of nicotine dependence: Secondary | ICD-10-CM | POA: Insufficient documentation

## 2023-02-27 DIAGNOSIS — Z7901 Long term (current) use of anticoagulants: Secondary | ICD-10-CM | POA: Insufficient documentation

## 2023-02-27 DIAGNOSIS — Z8551 Personal history of malignant neoplasm of bladder: Secondary | ICD-10-CM | POA: Insufficient documentation

## 2023-02-27 DIAGNOSIS — Z794 Long term (current) use of insulin: Secondary | ICD-10-CM | POA: Insufficient documentation

## 2023-02-27 DIAGNOSIS — Z9861 Coronary angioplasty status: Secondary | ICD-10-CM | POA: Diagnosis not present

## 2023-02-27 HISTORY — DX: Malignant neoplasm of bladder, unspecified: C67.9

## 2023-02-27 MED ORDER — LIDOCAINE 5 % EX CREA: TOPICAL_CREAM | CUTANEOUS | 0 refills | Status: AC

## 2023-02-27 MED ORDER — LIDOCAINE HCL URETHRAL/MUCOSAL 2 % EX GEL
1.0000 | Freq: Once | CUTANEOUS | Status: AC
  Administered 2023-02-27: 1 via TOPICAL

## 2023-02-27 NOTE — ED Notes (Signed)
500cc of soap suds enema instilled in rectum. Pt. Sitting on [facility]

## 2023-02-27 NOTE — ED Notes (Signed)
This nurse and Willa Rough, RN administered of soap suds enema. Patient up to bedside commode at this time.

## 2023-02-27 NOTE — ED Provider Notes (Signed)
[facility] Provider Note: Lowella Dell, MD, FACEP  CSN: [id number] MRN: [id number] ARRIVAL: 02/27/23 at 0511 ROOM: DB013/DB013   CHIEF COMPLAINT  Constipation   HISTORY OF PRESENT ILLNESS  02/27/23 5:38 AM Debbie [name] is a 77 y.o. female who with a history of bladder cancer diagnosed about a year ago.  Because of cancer related pain she is on oxycodone.  Despite being on Linzess she has not had a bowel movement for several days.  She feels a hard ball of stool in her rectum but is unable to pass it.  She rates associated pain as a 3 out of 10.  She has also had increased edema of her left leg (she is status post right BKA).  She has not had nausea or vomiting with this.   Past Medical History:  Diagnosis Date   Abnormal liver function    Adenomatous polyp 12/04/2006   AKI (acute kidney injury) (HCC)    Asthma    Bladder cancer (HCC)    DM type 2 (diabetes mellitus, type 2) (HCC)    GERD (gastroesophageal reflux disease) 02/28/2012   Hemorrhoid 12/04/2006   Hyperlipidemia    Hypertension    Hypomagnesemia    Hypotension 09/21/2019   Peripheral arterial disease (HCC)    Persistent atrial fibrillation (HCC) 11/15/2019   Pulmonary hypertension (HCC) 03/28/2017   Echo 04/04/17 Compared to a prior study in 2015,   there is now moderate LVH and the LVEF is higher at 65-70%. No   obvious PFO noted by saline microbubble contrast. There is   moderate TR with an RVSP of 56 mmHg and a normal, collapsing IVC.   Consistent with moderate pulmonary hypertension.   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 Anabel Bene, adenomatous polyp   COLONOSCOPY  03/25/2012   Procedure: COLONOSCOPY;  Surgeon: Corbin Ade, MD;  Location: [facility] ENDO SUITE;  Service: Endoscopy;  Laterality: N/A;  10:30   ESOPHAGOGASTRODUODENOSCOPY  11/03/2002   Dr. Jena Gauss- normal exam- was done to check for possible foreign body   Fiberoptic bronchoscopy with endobronchial  ultrasound  10/22/2010   [place]   Right BKA  1990   RIGHT HEART CATHETERIZATION N/A 06/22/2014   Procedure: RIGHT HEART CATH;  Surgeon: Laurey Morale, MD;  Location: [facility] CATH LAB;  Service: Cardiovascular;  Laterality: N/A;    Family History  Problem Relation Age of Onset   CVA Mother 2   Heart disease Mother    Diabetes Mother 35   Lung cancer Father        lung carcinoma   COPD Sister    COPD Sister    Diabetes Sister    Breast cancer Sister        Mastectomy   Cancer Maternal Grandmother    Colon cancer Neg Hx     Social History   Tobacco Use   Smoking status: Former    Packs/day: 0.50    Years: 18.00    Additional pack years: 0.00    Total pack years:  9.00    Types: Cigarettes    Quit date: 09/16/1990    Years since quitting: 32.4   Smokeless tobacco: Never  Vaping Use   Vaping Use: Never used  Substance Use Topics   Alcohol use: No    Alcohol/week: 0.0 standard drinks of alcohol   Drug use: No    Prior to Admission medications   Medication Sig Start Date End Date Taking? Authorizing Provider  Oxycodone HCl 10 MG TABS Take by mouth. 02/20/23 03/22/23 Yes [provider]  acetaminophen (TYLENOL) 500 MG tablet Take 500 mg by mouth every 6 (six) hours as needed for mild pain (or headaches).    [provider]  apixaban (ELIQUIS) 5 MG TABS tablet Take 1 tablet (5 mg total) by mouth 2 (two) times daily. 05/03/22   Marinus Maw, MD  BD PEN NEEDLE NANO U/F 32G X 4 MM MISC 2 (two) times daily. as directed 02/26/15   [provider]  Calcium Carb-Cholecalciferol (CALCIUM 1000 +  D PO) Take 1,000 mg by mouth daily.    [provider]  Coenzyme Q10 400 MG CAPS Take 400 mg by mouth daily.     [provider]  diltiazem (CARDIZEM CD) 240 MG 24 hr capsule Take 1 capsule (240 mg total) by mouth daily. 09/25/19   Albertine Grates, MD  docusate sodium (COLACE) 100 MG capsule Take 100 mg by mouth 2 (two) times daily.    [provider]  dofetilide (TIKOSYN) 250 MCG capsule Take 1 capsule (250 mcg total) by mouth 2 (two) times daily. 08/22/22   Marinus Maw, MD  donepezil (ARICEPT) 10 MG tablet Take by mouth. 10/26/21   [provider]  estradiol (ESTRACE) 0.1 MG/GM vaginal cream Place 1 Applicatorful vaginally 3 (three) times a week. 04/19/21   [provider]  ezetimibe (ZETIA) 10 MG tablet Take 10 mg by mouth every evening.  08/27/19   [provider]  furosemide (LASIX) 40 MG tablet Take 2 tablets (80 mg total) by mouth daily. 09/04/21 02/18/23  Graciella Freer, PA-C  gabapentin (NEURONTIN) 100 MG capsule Take 300 mg by mouth daily. Take 200 mg in the morning and 100 mg at night    [provider]  glucosamine-chondroitin 500-400 MG tablet Take 1 tablet by mouth every morning.    [provider]  halobetasol (ULTRAVATE) 0.05 % cream Apply 1 application topically 2 (two) times daily as needed (psoriasis).  08/15/16   [provider]  ibandronate (BONIVA) 150 MG tablet Take 150 mg by mouth every 30 (thirty) days. 02/15/20   [provider]  Insulin Glargine (BASAGLAR KWIKPEN) 100 UNIT/ML SOPN Inject 45 Units into the skin daily before breakfast.    [provider]  Insulin Pen Needle 32G X 4 MM MISC USE TWICE DAILY AS DIRECTED 05/31/15   [provider]  JANUVIA 100 MG tablet Take 100 mg by mouth daily. 04/05/21   [provider]  ketotifen (ZADITOR) 0.025 % ophthalmic solution Place 1 drop into both eyes daily as needed (for irritation).    [provider]   Lidocaine-Glycerin (PREPARATION H EX) Place 1 application rectally 2 (two) times daily as needed (for pain).    [provider]  linaclotide Karlene Einstein) 145 MCG CAPS capsule Take 1 capsule (145 mcg total) by mouth daily before breakfast. 08/06/22   Rourk, Gerrit Friends, MD  losartan (COZAAR) 100 MG tablet TAKE 1 TABLET BY MOUTH EVERY DAY 05/28/22   Marinus Maw,  MD  magnesium oxide (MAG-OX) 400 (241.3 Mg) MG tablet Take 1 tablet (400 mg total) by mouth 2 (two) times daily. Please make yearly appt with Dr. Ladona Ridgel for April 2022 for future refills. Thank you 1st attempt 10/18/20   Marinus Maw, MD  methenamine (HIPREX) 1 g tablet Take 1 g by mouth 2 (two) times daily. 05/17/21   [provider]  MYRBETRIQ 25 MG TB24 tablet Take 25 mg by mouth daily. 04/14/22   [provider]  ondansetron (ZOFRAN-ODT) 4 MG disintegrating tablet Take 4 mg by mouth every 8 (eight) hours as needed for nausea or vomiting.    [provider]  OXYGEN Inhale 3 L/min into the lungs continuous.    [provider]  pantoprazole (PROTONIX) 40 MG tablet Take 1 tablet (40 mg total) by mouth 2 (two) times daily. 11/19/22   Rourk, Gerrit Friends, MD  polyethylene glycol (MIRALAX / GLYCOLAX) 17 g packet Take 17 g by mouth daily as needed.    [provider]  potassium chloride SA (KLOR-CON M) 20 MEQ tablet Take 1 tablet (20 mEq total) by mouth 3 (three) times a week. 08/20/21   Joseph Art, DO  tamsulosin (FLOMAX) 0.4 MG CAPS capsule Take 0.4 mg by mouth daily. 10/29/21   [provider]  tiZANidine (ZANAFLEX) 4 MG tablet Take 4 mg by mouth at bedtime. 11/04/19   [provider]  VOLTAREN 1 % GEL Apply 2 g topically 4 (four) times daily as needed (for pain). 08/13/17   [provider]    Allergies Codeine, Meloxicam, and Rofecoxib   REVIEW OF SYSTEMS  Negative except as noted here or in the History of Present Illness.   PHYSICAL EXAMINATION  Initial Vital  Signs Blood pressure (!) 150/73, pulse 94, temperature 98.6 F (37 C), resp. rate (!) 21, height 5\' 8"  (1.727 m), weight 90.7 kg, SpO2 96 %.  Examination General: Well-developed, well-nourished female in no acute distress; appearance consistent with age of record HENT: normocephalic; atraumatic Eyes: Normal appearance Neck: supple Heart: regular rate and rhythm Lungs: clear to auscultation bilaterally Abdomen: soft; nondistended; nontender; fullness or mass in suprapubic region; bowel sounds present Rectal: Normal sphincter tone; fecal impaction palpated Extremities: Right BKA; 2+ pitting edema of left lower extremity Neurologic: Awake, alert and oriented; motor function intact in all extremities and symmetric; no facial droop Skin: Warm and dry Psychiatric: Flat affect   RESULTS  Summary of this visit's results, reviewed and interpreted by myself:   EKG Interpretation  Date/Time:    Ventricular Rate:    PR Interval:    QRS Duration:   QT Interval:    QTC Calculation:   R Axis:     Text Interpretation:         Laboratory Studies: No results found for this or any previous visit (from the past 24 hour(s)). Imaging Studies: No results found.  ED COURSE and MDM  Nursing notes, initial and subsequent vitals signs, including pulse oximetry, reviewed and interpreted by myself.  Vitals:   02/27/23 0520  BP: (!) 150/73  Pulse: 94  Resp: (!) 21  Temp: 98.6 F (37 C)  SpO2: 96%  Weight: 90.7 kg  Height: 5\' 8"  (1.727 m)   Medications  lidocaine (XYLOCAINE) 2 % jelly 1 Application (1 Application Topical Given 02/27/23 0550)   I was not able to manually grip the fecal impaction so we will attempt to relieve her with a soapsuds enema.  6:22 AM Soapsuds enema administered.  Patient was not able to pass any formed stool but the sufficiently lubricated the patient's rectum such that I could perform manual disimpaction as described below.  PROCEDURES  Fecal  disimpaction  Date/Time: 02/27/2023 6:20 AM  Performed by: Sunita Demond, MD Authorized by: Madysun Thall, MD  Consent: Verbal consent obtained. Risks and benefits: risks, benefits and alternatives were discussed Consent given by: patient Patient understanding: patient states understanding of the procedure being performed Patient identity confirmed: verbally with patient and arm band Time out: Immediately prior to procedure a "time out" was called to verify the correct patient, procedure, equipment, support staff and site/side marked as required. Local anesthesia used: yes  Anesthesia: Local anesthesia used: yes Local Anesthetic: topical anesthetic  Sedation: Patient sedated: no  Patient tolerance: patient tolerated the procedure well with no immediate complications Comments: Large amount of formed stool was removed from the patient's rectum.  No formed stool remaining in rectum at the end of procedure.    ED DIAGNOSES     ICD-10-CM   1. Fecal impaction in rectum (HCC)  K56.41          Texie Tupou, Jonny Ruiz, MD 02/27/23 [phone]

## 2023-02-27 NOTE — ED Notes (Signed)
Small amount of watery stool noted in [facility]. Pt's rectum bulging with stool that wouldn't come out. Dr.Molpus in and dis-impacted patient. Patient cleansed and dried, husband back in room.

## 2023-02-27 NOTE — ED Triage Notes (Signed)
Pt c/o of constipation has been going on for the past couple days. Is on narcotics due to cancer diagnosis. C/o lower abd pain. Pt also concerned about her left lower leg swelling. Denies any c/p. Pt wears 3l n/c at home.

## 2023-02-27 NOTE — ED Notes (Signed)
Patient on 3LNC from home.

## 2023-03-04 ENCOUNTER — Other Ambulatory Visit: Payer: Self-pay | Admitting: Internal Medicine

## 2023-03-04 NOTE — Telephone Encounter (Signed)
Pt last saw Dr Ladona Ridgel 03/28/22, last labs 01/31/23 Creat 1.30, age 77, weight 90.7kg, based on specified criteria pt is on appropriate dosage of Eliquis 5mg  BID for afib.  Will refill rx.

## 2023-03-07 ENCOUNTER — Ambulatory Visit (INDEPENDENT_AMBULATORY_CARE_PROVIDER_SITE_OTHER): Payer: Medicare Other | Admitting: Internal Medicine

## 2023-03-07 ENCOUNTER — Inpatient Hospital Stay: Payer: Medicare Other | Admitting: Internal Medicine

## 2023-03-07 ENCOUNTER — Encounter: Payer: Self-pay | Admitting: Internal Medicine

## 2023-03-07 VITALS — BP 134/80 | HR 95 | Temp 98.2°F | Ht 68.0 in | Wt 202.4 lb

## 2023-03-07 DIAGNOSIS — J9611 Chronic respiratory failure with hypoxia: Secondary | ICD-10-CM | POA: Diagnosis not present

## 2023-03-07 DIAGNOSIS — Z87891 Personal history of nicotine dependence: Secondary | ICD-10-CM | POA: Diagnosis not present

## 2023-03-07 NOTE — Progress Notes (Signed)
Debbie [name]    [id number]    1945/11/09  Primary Care Physician:Badger, Kayleen Memos, MD Date of Appointment: 03/07/2023 Established Patient Visit  Chief complaint:   Chief Complaint  Patient presents with   Hospitalization Follow-up    Unchanged SOB     HPI: Debbie [name] is a 77 y.o. woman with chronic dyspnea and hypoxemia on [facility] who sees Dr. Sherene Sires. History of tobacco use disorder.    Interval Updates: She is here for a follow up visit today.   She does have a history of recurrent pneumonia in 2011-2013.   Not on any maintenance inahlers. She has been on maintenance inhalers in the past and has not found them to be helpful. She feels her breathing is not worsened since 2019 when she last had PFTs.   She did have some pulmonary nodules in 2012-2015 which resolved.   She has had diagnosed with bladder cancer s/p right nephrectomy and is now getting immunotherapy (since sept 2023) and radiation. She had progression with immunotherapy and has been told she's on her last ditch treatment options.    I have reviewed the patient's family social and past medical history and updated as appropriate.   Past Medical History:  Diagnosis Date   Abnormal liver function    Adenomatous polyp 12/04/2006   AKI (acute kidney injury) (HCC)    Asthma    Bladder cancer (HCC)    DM type 2 (diabetes mellitus, type 2) (HCC)    GERD (gastroesophageal reflux disease) 02/28/2012   Hemorrhoid 12/04/2006   Hyperlipidemia    Hypertension    Hypomagnesemia    Hypotension 09/21/2019   Peripheral arterial disease (HCC)    Persistent atrial fibrillation (HCC) 11/15/2019   Pulmonary hypertension (HCC) 03/28/2017   Echo 04/04/17 Compared to a prior study in 2015,   there is now moderate LVH and the LVEF is higher at 65-70%. No   obvious PFO noted by saline microbubble contrast. There is   moderate TR with an RVSP of 56 mmHg and a normal, collapsing IVC.   Consistent with moderate  pulmonary hypertension.   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 Anabel Bene, adenomatous polyp   COLONOSCOPY  03/25/2012   Procedure: COLONOSCOPY;  Surgeon: Corbin Ade, MD;  Location: [facility] ENDO SUITE;  Service: Endoscopy;  Laterality: N/A;  10:30   ESOPHAGOGASTRODUODENOSCOPY  11/03/2002   Dr. Jena Gauss- normal exam- was done to check for possible foreign body   Fiberoptic bronchoscopy with endobronchial  ultrasound  10/22/2010   [place]   Right BKA  1990   RIGHT HEART CATHETERIZATION N/A 06/22/2014   Procedure: RIGHT HEART CATH;  Surgeon: Laurey Morale, MD;  Location: [facility] CATH LAB;  Service: Cardiovascular;  Laterality: N/A;    Family History  Problem Relation Age of Onset   CVA Mother 7   Heart disease Mother    Diabetes Mother 53   Lung cancer Father        lung carcinoma  Cancer - Lung Father    COPD Sister    COPD Sister    Diabetes Sister    Breast cancer Sister        Mastectomy   Cancer Maternal Grandmother    Colon cancer Neg Hx     Social History   Occupational History   Occupation: retired    Associate Professor: UNEMPLOYED  Tobacco Use   Smoking status: Former    Packs/day: 1.00    Years: 18.00    Additional pack years: 0.00    Total pack years: 18.00    Types: Cigarettes    Quit date: 09/16/1990    Years since quitting: 32.4   Smokeless tobacco: Never  Vaping Use   Vaping Use: Never used  Substance and Sexual Activity   Alcohol use: No    Alcohol/week: 0.0 standard drinks of alcohol   Drug use: No   Sexual activity: Not Currently     Physical Exam: Blood  pressure 134/80, pulse 95, temperature 98.2 F (36.8 C), temperature source Oral, height 5\' 8"  (1.727 m), weight 202 lb 6.4 oz (91.8 kg), SpO2 93 %.  Gen:      No acute distress, in  motorized wheelchair ENT:  no nasal polyps, mucus membranes moist Lungs:    No increased respiratory effort, symmetric chest wall excursion, clear to auscultation bilaterally, no wheezes or crackles CV:         Regular rate and rhythm; no murmurs, rubs, or gallops.  No pedal edema Ext: right BKA with prosthesis  Data Reviewed: Imaging: I have personally reviewed the chest xray Feb 2023 - chronic interstitial changes.   PFTs:     Latest Ref Rng & Units 06/25/2018    8:42 AM 04/25/2014    2:36 PM  PFT Results  FVC-Pre L 2.69  2.78   FVC-Predicted Pre % 90  85   FVC-Post L 2.89  2.93   FVC-Predicted Post % 97  90   Pre FEV1/FVC % % 74  73   Post FEV1/FCV % % 74  75   FEV1-Pre L 1.98  2.03   FEV1-Predicted Pre % 88  82   FEV1-Post L 2.13  2.18   DLCO uncorrected ml/min/mmHg 11.54  13.35   DLCO UNC% % 46  50   DLVA Predicted % 55  60   TLC L 6.21  4.61   TLC % Predicted % 120  87   RV % Predicted % 123  80    I have personally reviewed the patient's PFTs and no airflow limitation. No BD response.   Echocardiogram Dec 2022 1. Left ventricular ejection fraction, by estimation, is 60 to 65%. The  left ventricle has normal function. The left ventricle has no regional  wall motion abnormalities. There is severe asymmetric left ventricular  hypertrophy of the basal-septal  segment. Left ventricular diastolic parameters are indeterminate.   2. Right ventricular systolic function is normal. The right ventricular  size is normal. There is mildly elevated pulmonary artery systolic  pressure. The estimated right ventricular systolic pressure is 38.0 mmHg.   3. The mitral valve is normal in structure. No evidence of mitral valve  regurgitation. No evidence of mitral stenosis.   4. The aortic valve is  tricuspid. Aortic valve regurgitation is not  visualized. Aortic valve sclerosis/calcification is present, without any  evidence of aortic stenosis.   5. The inferior vena cava is normal in size with greater than 50%  respiratory variability, suggesting right atrial pressure of  3 mmHg.     Labs: Lab Results  Component Value Date   NA 138 04/12/2022   K 4.2 04/12/2022   CO2 26 04/12/2022   GLUCOSE 108 (H) 04/12/2022   BUN 14 04/12/2022   CREATININE 0.99 04/12/2022   CALCIUM 9.7 04/12/2022   GFR 96.17 07/01/2014   EGFR 84 10/02/2021   GFRNONAA 59 (L) 04/12/2022   Lab Results  Component Value Date   WBC 11.3 (H) 04/12/2022   HGB 7.6 (L) 04/12/2022   HCT 25.0 (L) 04/12/2022   MCV 77.9 (L) 04/12/2022   PLT 369 04/12/2022    Immunization status: Immunization History  Administered Date(s) Administered   Fluad Quad(high Dose 65+) 07/17/2016, 07/22/2017, 06/11/2018, 06/29/2020, 08/06/2021   Influenza Split 07/17/2013, 06/17/2015   Influenza Whole 06/26/2015   Influenza, High Dose Seasonal PF 07/17/2016, 07/22/2017, 06/11/2018, 05/31/2019   Influenza, Seasonal, Injecte, Preservative Fre 06/21/2014   Influenza-Unspecified 06/20/2014, 07/22/2017, 06/11/2018   Janssen (J&J) SARS-COV-2 Vaccination 11/26/2019   PFIZER(Purple Top)SARS-COV-2 Vaccination 06/16/2020, 07/16/2020   Pneumococcal Conjugate-13 04/25/2015, 06/17/2015   Pneumococcal Polysaccharide-23 07/13/2009, 12/16/2010   Tdap 04/25/2015    External Records Personally Reviewed: pulmonary   Assessment:  Chronic respiratory failure Tobacco use disorder <10 pack years, quit 1992 Bladder cancer on chemotherapy and radiation   Plan/Recommendations:  I offered her repeat PFTs, trial of inhaler therapy. She declined at this time.  She has said that Dr. Sherene Sires has told her she does not have COPD.  For now continue oxygen therapy.  I would like her to follow up in 6 months with Dr. Sherene Sires given that she is going through  chemo/radiation.   I spent 30 minutes in the care of this patient today including pre-charting, chart review, review of results, face-to-face care, coordination of care and communication with consultants etc.).  Return to Care: Return in about 6 months (around 09/06/2023).   Durel Salts, MD Pulmonary and Critical Care Medicine [facility] Office:[phone]

## 2023-03-07 NOTE — Patient Instructions (Addendum)
Follow up in 6 months with Dr. Sherene Sires.  Continue your oxygen therapy.   Good luck with your chemotherapy and radiation.   Please call us sooner if any issues or concerns with your breathing.

## 2023-04-20 ENCOUNTER — Telehealth: Payer: Self-pay | Admitting: Cardiology

## 2023-04-20 NOTE — Telephone Encounter (Signed)
    Patient and daughter called after hours answering service today requesting a call back. Per patient, she was recently discharged from [facility] after an admission with sepsis. 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 This admission was complicated by acute renal failure and candidal skin infection. Patient and daughter report today that over the last 24-48 hours, she has become increasingly fatigued with worsening edema in left lower extremity (has right BKA and volume assessment is difficult). Per daughter, the LLE now has ulcerations and is pale/cool to touch. They were both inquiring about use of lasix. They have tried reaching both her oncologist as well as PCP without luck this weekend. Presumably with AKI during admission, this was not continued at discharge from the hospital. Given that patient has not been seen in over a year by [facility] providers and has complex comorbidities, I did not feel comfortable advising patient on use of lasix. Furthermore, I indicated to both patient and daughter that without an ability to physically assess her leg, I could not determine whether this was a heart failure issue or perhaps a vascular issue. I strongly recommended in person evaluation today at an urgent care or the ED. Patient and daughter confirm understanding.  Perlie Gold, PA-C

## 2023-04-30 ENCOUNTER — Ambulatory Visit: Payer: Medicare Other | Admitting: Nurse Practitioner

## 2023-05-21 ENCOUNTER — Other Ambulatory Visit (HOSPITAL_COMMUNITY): Payer: Self-pay | Admitting: Internal Medicine

## 2023-06-11 ENCOUNTER — Encounter: Payer: Self-pay | Admitting: Nurse Practitioner

## 2023-06-11 ENCOUNTER — Ambulatory Visit: Payer: Medicare Other | Attending: Nurse Practitioner | Admitting: Nurse Practitioner

## 2023-06-11 VITALS — BP 140/78 | HR 70 | Ht 68.0 in | Wt 173.4 lb

## 2023-06-11 DIAGNOSIS — C679 Malignant neoplasm of bladder, unspecified: Secondary | ICD-10-CM | POA: Insufficient documentation

## 2023-06-11 DIAGNOSIS — E1142 Type 2 diabetes mellitus with diabetic polyneuropathy: Secondary | ICD-10-CM | POA: Diagnosis present

## 2023-06-11 DIAGNOSIS — J449 Chronic obstructive pulmonary disease, unspecified: Secondary | ICD-10-CM | POA: Diagnosis present

## 2023-06-11 DIAGNOSIS — Z794 Long term (current) use of insulin: Secondary | ICD-10-CM | POA: Diagnosis present

## 2023-06-11 DIAGNOSIS — N1832 Chronic kidney disease, stage 3b: Secondary | ICD-10-CM | POA: Insufficient documentation

## 2023-06-11 DIAGNOSIS — I5032 Chronic diastolic (congestive) heart failure: Secondary | ICD-10-CM | POA: Diagnosis not present

## 2023-06-11 DIAGNOSIS — E782 Mixed hyperlipidemia: Secondary | ICD-10-CM | POA: Insufficient documentation

## 2023-06-11 DIAGNOSIS — I10 Essential (primary) hypertension: Secondary | ICD-10-CM | POA: Diagnosis present

## 2023-06-11 DIAGNOSIS — I480 Paroxysmal atrial fibrillation: Secondary | ICD-10-CM | POA: Diagnosis not present

## 2023-06-11 NOTE — Progress Notes (Signed)
Office Visit    Patient Name: Debbie [name] Date of Encounter: 06/11/2023  Primary Care Provider:  Eartha Inch, MD Primary Cardiologist:  Lewayne Bunting, MD  Chief Complaint    77 year old female with the above past medical history including paroxysmal atrial fibrillation, chronic diastolic heart failure, hypertension, hyperlipidemia, COPD with chronic hypoxemia on home O2, type 2 diabetes, CKD stage IIIb, bladder cancer, Buerger's disease s/p R BKA, and GERD who presents for hospital follow-up related to heart failure.  Past Medical History    Past Medical History:  Diagnosis Date   Abnormal liver function    Adenomatous polyp 12/04/2006   AKI (acute kidney injury) (HCC)    Asthma    Bladder cancer (HCC)    DM type 2 (diabetes mellitus, type 2) (HCC)    GERD (gastroesophageal reflux disease) 02/28/2012   Hemorrhoid 12/04/2006   Hyperlipidemia    Hypertension    Hypomagnesemia    Hypotension 09/21/2019   Peripheral arterial disease (HCC)    Persistent atrial fibrillation (HCC) 11/15/2019   Pulmonary hypertension (HCC) 03/28/2017   Echo 04/04/17 Compared to a prior study in 2015,   there is now moderate LVH and the LVEF is higher at 65-70%. No   obvious PFO noted by saline microbubble contrast. There is   moderate TR with an RVSP of 56 mmHg and a normal, collapsing IVC.   Consistent with moderate pulmonary hypertension.   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 Anabel Bene, adenomatous polyp   COLONOSCOPY  03/25/2012   Procedure: COLONOSCOPY;  Surgeon: Corbin Ade, MD;   Location: [facility] ENDO SUITE;  Service: Endoscopy;  Laterality: N/A;  10:30   ESOPHAGOGASTRODUODENOSCOPY  11/03/2002   Dr. Jena Gauss- normal exam- was done to check for possible foreign body   Fiberoptic bronchoscopy with endobronchial  ultrasound  10/22/2010   [place]   Right BKA  1990   RIGHT HEART CATHETERIZATION N/A 06/22/2014   Procedure: RIGHT HEART CATH;  Surgeon: Laurey Morale, MD;  Location: [facility] CATH LAB;  Service: Cardiovascular;  Laterality: N/A;    Allergies  Allergies  Allergen Reactions   Codeine Itching   Meloxicam Other (See Comments)    Causes excess Fluid buildup   Rofecoxib Other (See Comments)    (VIOXX) Extreme fatigue     Labs/Other Studies Reviewed    The following studies were reviewed today:  Cardiac Studies & Procedures       ECHOCARDIOGRAM  ECHOCARDIOGRAM COMPLETE 08/17/2021  Narrative ECHOCARDIOGRAM REPORT    Patient Name:   Debbie [name] [facility] Date of Exam: 08/17/2021 Medical Rec #:  [id number]         Height:       69.0 in Accession #:    [id number]        Weight:       206.1 lb Date of Birth:  [birth year]         BSA:          2.093 m Patient Age:    45 years  BP:           165/76 mmHg Patient Gender: F                 HR:           84 bpm. Exam Location:  Inpatient  Procedure: 2D Echo, Color Doppler, Cardiac Doppler and Intracardiac Opacification Agent  Indications:    CHF  History:        Patient has prior history of Echocardiogram examinations. CHF, Pulmonary HTN, Arrythmias:Atrial Fibrillation; Risk Factors:Diabetes and Sleep Apnea.  Sonographer:    Cleatis Polka Referring Phys: [id number] Cecille Po MELVIN  IMPRESSIONS   1. Left ventricular ejection fraction, by estimation, is 60 to 65%. The left ventricle has normal function. The left ventricle has no regional wall motion abnormalities. There is severe asymmetric left ventricular hypertrophy of the basal-septal segment. Left ventricular diastolic parameters are indeterminate. 2.  Right ventricular systolic function is normal. The right ventricular size is normal. There is mildly elevated pulmonary artery systolic pressure. The estimated right ventricular systolic pressure is 38.0 mmHg. 3. The mitral valve is normal in structure. No evidence of mitral valve regurgitation. No evidence of mitral stenosis. 4. The aortic valve is tricuspid. Aortic valve regurgitation is not visualized. Aortic valve sclerosis/calcification is present, without any evidence of aortic stenosis. 5. The inferior vena cava is normal in size with greater than 50% respiratory variability, suggesting right atrial pressure of 3 mmHg.  FINDINGS Left Ventricle: Left ventricular ejection fraction, by estimation, is 60 to 65%. The left ventricle has normal function. The left ventricle has no regional wall motion abnormalities. The left ventricular internal cavity size was normal in size. There is severe asymmetric left ventricular hypertrophy of the basal-septal segment. Left ventricular diastolic parameters are indeterminate.  Right Ventricle: The right ventricular size is normal. Right vetricular wall thickness was not well visualized. Right ventricular systolic function is normal. There is mildly elevated pulmonary artery systolic pressure. The tricuspid regurgitant velocity is 2.96 m/s, and with an assumed right atrial pressure of 3 mmHg, the estimated right ventricular systolic pressure is 38.0 mmHg.  Left Atrium: Left atrial size was normal in size.  Right Atrium: Right atrial size was normal in size.  Pericardium: There is no evidence of pericardial effusion.  Mitral Valve: The mitral valve is normal in structure. No evidence of mitral valve regurgitation. No evidence of mitral valve stenosis.  Tricuspid Valve: The tricuspid valve is normal in structure. Tricuspid valve regurgitation is trivial.  Aortic Valve: The aortic valve is tricuspid. Aortic valve regurgitation is not visualized. Aortic valve  sclerosis/calcification is present, without any evidence of aortic stenosis. Aortic valve peak gradient measures 9.5 mmHg.  Pulmonic Valve: The pulmonic valve was not well visualized. Pulmonic valve regurgitation is not visualized.  Aorta: The aortic root and ascending aorta are structurally normal, with no evidence of dilitation.  Venous: The inferior vena cava is normal in size with greater than 50% respiratory variability, suggesting right atrial pressure of 3 mmHg.  IAS/Shunts: The interatrial septum was not well visualized.   LEFT VENTRICLE PLAX 2D LVIDd:         4.40 cm      Diastology LVIDs:         2.50 cm      LV e' medial:    4.57 cm/s LV PW:         1.20 cm      LV E/e' medial:  10.8 LV IVS:  1.20 cm      LV e' lateral:   8.27 cm/s LVOT diam:     2.00 cm      LV E/e' lateral: 6.0 LV SV:         58 LV SV Index:   28 LVOT Area:     3.14 cm  LV Volumes (MOD) LV vol d, MOD A2C: 98.5 ml LV vol d, MOD A4C: 108.0 ml LV vol s, MOD A2C: 39.2 ml LV vol s, MOD A4C: 40.8 ml LV SV MOD A2C:     59.3 ml LV SV MOD A4C:     108.0 ml LV SV MOD BP:      66.9 ml  RIGHT VENTRICLE             IVC RV Basal diam:  3.20 cm     IVC diam: 1.70 cm RV Mid diam:    2.10 cm RV S prime:     10.30 cm/s TAPSE (M-mode): 1.8 cm  LEFT ATRIUM             Index        RIGHT ATRIUM           Index LA diam:        3.50 cm 1.67 cm/m   RA Area:     16.30 cm LA Vol (A2C):   27.8 ml 13.28 ml/m  RA Volume:   35.00 ml  16.72 ml/m LA Vol (A4C):   42.9 ml 20.50 ml/m LA Biplane Vol: 35.1 ml 16.77 ml/m AORTIC VALVE AV Area (Vmax): 2.16 cm AV Vmax:        154.00 cm/s AV Peak Grad:   9.5 mmHg LVOT Vmax:      106.00 cm/s LVOT Vmean:     74.800 cm/s LVOT VTI:       0.185 m  AORTA Ao Root diam: 2.70 cm Ao Asc diam:  3.30 cm  MITRAL VALVE               TRICUSPID VALVE MV Area (PHT): 3.06 cm    TR Peak grad:   35.0 mmHg MV Decel Time: 248 msec    TR Vmax:        296.00 cm/s MV E velocity:  49.40 cm/s MV A velocity: 61.00 cm/s  SHUNTS MV E/A ratio:  0.81        Systemic VTI:  0.18 m Systemic Diam: 2.00 cm  Epifanio Lesches MD Electronically signed by Epifanio Lesches MD Signature Date/Time: 08/17/2021/4:15:33 PM    Final            Recent Labs: No results found for requested labs within last 365 days.  Recent Lipid Panel    Component Value Date/Time   CHOL 147 08/17/2014 0840   TRIG 84.0 08/17/2014 0840   HDL 51.30 08/17/2014 0840   CHOLHDL 3 08/17/2014 0840   VLDL 16.8 08/17/2014 0840   LDLCALC 79 08/17/2014 0840    History of Present Illness    77 year old female with the above past medical history including paroxysmal atrial fibrillation, chronic diastolic heart failure, hypertension, hyperlipidemia, COPD with chronic hypoxemia on home O2, type 2 diabetes, CKD stage IIIb, bladder cancer,  Buerger's disease s/p R BKA, and GERD.  She has a history of atrial fibrillation on Tikosyn and Eliquis.  Echocardiogram in 08/2021 showed EF 60 to 65%, normal LV function, no RWMA, severe asymmetric LVH of the basal septal segment, normal RV, mildly elevated PASP, no significant valvular abnormalities.  Follows with  Dr. Ladona Ridgel, EP and was last seen in the office on 03/28/2022 and was doing well from a cardiac standpoint.  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  She was hospitalized again in August 2024 in the setting of acute on chronic diastolic failure due to presumed pneumonia in the setting of known COPD, acute on chronic diastolic heart failure.  Echocardiogram showed EF 60 to 65%, normal wall thickness, normal RV, no significant valvular abnormalities. Her diltiazem was increased to 360 mg daily for better heart rate control.  Her Lasix was increased to 80 mg daily.Marland Kitchen  She was discharged home in stable condition on 04/20/2023.  She presents today for follow-up accompanied by her daughter. Since her hospitalization  she has been stable from a cardiac standpoint.  Her lower extremity edema has been well-controlled on increased Lasix dosing.  Denies any worsening dyspnea, denies chest pain, palpitations, PND, orthopnea, weight gain. She notes that she saw her oncologist a couple of weeks ago and the decision was made to stop chemotherapy.  She was told that her bladder cancer is incurable and that this will be a terminal diagnosis.  She has been recovering from shingles and with this she has had significant discomfort on her right side.   Home Medications    Current Outpatient Medications  Medication Sig Dispense Refill   apixaban (ELIQUIS) 5 MG TABS tablet TAKE 1 TABLET BY MOUTH TWICE A DAY 60 tablet 5   Calcium Carb-Cholecalciferol (CALCIUM 1000 + D PO) Take 1,000 mg by mouth daily.     Coenzyme Q10 400 MG CAPS Take 400 mg by mouth daily.      diltiazem (CARDIZEM CD) 240 MG 24 hr capsule Take 1 capsule (240 mg total) by mouth daily. 30 capsule 0   docusate sodium (COLACE) 100 MG capsule Take 100 mg by mouth 2 (two) times daily.     dofetilide (TIKOSYN) 250 MCG capsule Take 1 capsule (250 mcg total) by mouth 2 (two) times daily. 180 capsule 3   donepezil (ARICEPT) 10 MG tablet Take by mouth.     estradiol (ESTRACE) 0.1 MG/GM vaginal cream Place 1 Applicatorful vaginally 3 (three) times a week.     ezetimibe (ZETIA) 10 MG tablet Take 10 mg by mouth every evening.      gabapentin (NEURONTIN) 100 MG capsule Take 300 mg by mouth daily. Take 200 mg in the morning and 100 mg at night     glucosamine-chondroitin 500-400 MG tablet Take 1 tablet by mouth every morning.     halobetasol (ULTRAVATE) 0.05 % cream Apply 1 application topically 2 (two) times daily as needed (psoriasis).      ibandronate (BONIVA) 150 MG tablet Take 150 mg by mouth every 30 (thirty) days.     Insulin Glargine (BASAGLAR KWIKPEN) 100 UNIT/ML SOPN Inject 45 Units into the skin daily before breakfast.     Insulin Pen Needle 32G X 4 MM MISC USE  TWICE DAILY AS DIRECTED     JANUVIA 100 MG tablet Take 100 mg by mouth daily.     ketotifen (ZADITOR) 0.025 % ophthalmic solution Place 1 drop into both eyes daily as needed (for irritation).     Lidocaine 5 % CREA Apply to perianal area as needed for pain before or after bowel movements. 30 g 0   Lidocaine-Glycerin (PREPARATION H EX) Place 1 application rectally 2 (two) times daily as needed (for pain).     linaclotide (LINZESS) 145 MCG  CAPS capsule Take 1 capsule (145 mcg total) by mouth daily before breakfast. 30 capsule 11   losartan (COZAAR) 100 MG tablet TAKE 1 TABLET BY MOUTH EVERY DAY 30 tablet 0   magnesium oxide (MAG-OX) 400 (241.3 Mg) MG tablet Take 1 tablet (400 mg total) by mouth 2 (two) times daily. Please make yearly appt with Dr. Ladona Ridgel for April 2022 for future refills. Thank you 1st attempt 60 tablet 2   methenamine (HIPREX) 1 g tablet Take 1 g by mouth 2 (two) times daily.     MYRBETRIQ 25 MG TB24 tablet Take 25 mg by mouth daily.     ondansetron (ZOFRAN-ODT) 4 MG disintegrating tablet Take 4 mg by mouth every 8 (eight) hours as needed for nausea or vomiting.     OXYGEN Inhale 3 L/min into the lungs continuous.     pantoprazole (PROTONIX) 40 MG tablet Take 1 tablet (40 mg total) by mouth 2 (two) times daily. 60 tablet 11   polyethylene glycol (MIRALAX / GLYCOLAX) 17 g packet Take 17 g by mouth daily as needed.     potassium chloride SA (KLOR-CON M) 20 MEQ tablet Take 1 tablet (20 mEq total) by mouth 3 (three) times a week. 30 tablet 0   tamsulosin (FLOMAX) 0.4 MG CAPS capsule Take 0.4 mg by mouth daily.     tiZANidine (ZANAFLEX) 4 MG tablet Take 4 mg by mouth at bedtime.     VOLTAREN 1 % GEL Apply 2 g topically 4 (four) times daily as needed (for pain).  2   acetaminophen (TYLENOL) 500 MG tablet Take 500 mg by mouth every 6 (six) hours as needed for mild pain (or headaches). (Patient not taking: Reported on 06/11/2023)     BD PEN NEEDLE NANO U/F 32G X 4 MM MISC 2 (two) times  daily. as directed  6   furosemide (LASIX) 40 MG tablet Take 2 tablets (80 mg total) by mouth daily. 180 tablet 3   No current facility-administered medications for this visit.     Review of Systems    She denies chest pain, palpitations, pnd, orthopnea, n, v, dizziness, syncope, edema, weight gain, or early satiety. All other systems reviewed and are otherwise negative except as noted above.   Physical Exam    VS:  BP (!) 140/78 (BP Location: Left Arm, Patient Position: Sitting, Cuff Size: Normal)   Pulse 70   Ht 5\' 8"  (1.727 m)   Wt 173 lb 6.4 oz (78.7 kg)   SpO2 90%   BMI 26.37 kg/m   GEN: Well nourished, well developed, in no acute distress. HEENT: normal. Neck: Supple, no JVD, carotid bruits, or masses. Cardiac: RRR, no murmurs, rubs, or gallops. No clubbing, cyanosis, edema.  Radials/L DP/PT 2+. R BKA.  Respiratory:  Respirations regular and unlabored, clear to auscultation bilaterally. GI: Soft, nontender, nondistended, BS + x 4. MS: no deformity or atrophy. Skin: warm and dry, no rash. Neuro:  Strength and sensation are intact. Psych: Normal affect.  Accessory Clinical Findings    ECG personally reviewed by me today - EKG Interpretation Date/Time:  Wednesday June 11 2023 11:27:03 EDT Ventricular Rate:  89 PR Interval:  208 QRS Duration:  76 QT Interval:  392 QTC Calculation: 476 R Axis:   -5  Text Interpretation: Normal sinus rhythm Nonspecific T wave abnormality Confirmed by Bernadene Person ([id number]) on 06/11/2023 11:37:51 AM  - no acute changes.   Lab Results  Component Value Date   WBC 11.3 (H) 04/12/2022  HGB 7.6 (L) 04/12/2022   HCT 25.0 (L) 04/12/2022   MCV 77.9 (L) 04/12/2022   PLT 369 04/12/2022   Lab Results  Component Value Date   CREATININE 0.99 04/12/2022   BUN 14 04/12/2022   NA 138 04/12/2022   K 4.2 04/12/2022   CL 101 04/12/2022   CO2 26 04/12/2022   Lab Results  Component Value Date   ALT 7 04/12/2022   AST 10 (L) 04/12/2022    ALKPHOS 75 04/12/2022   BILITOT 0.3 04/12/2022   Lab Results  Component Value Date   CHOL 147 08/17/2014   HDL 51.30 08/17/2014   LDLCALC 79 08/17/2014   TRIG 84.0 08/17/2014   CHOLHDL 3 08/17/2014    Lab Results  Component Value Date   HGBA1C 6.5 (H) 03/05/2021    Assessment & Plan    1. Paroxysmal atrial fibrillation: Maintaining sinus rhythm.  Denies any recent palpitations.  Diltiazem was increased during recent hospitalization for better rate control. Continue carvedilol, Tikosyn, Eliquis.  2. Chronic diastolic heart failure: Recent hospitalization in the setting of acute on chronic diastolic heart failure, pneumonia, COPD exacerbation. Echo showed EF 60 to 65%, normal wall thickness, normal RV, no significant valvular abnormalities.  She has stable chronic dyspnea.  Euvolemic and well compensated on exam.  Continue losartan, Lasix.  2. Hypertension: BP well controlled. Continue current antihypertensive regimen.   3. Hyperlipidemia: No recent LDL on file.  Monitored and managed per PCP.  Continue Zetia.  4. COPD: Recent exacerbation requiring hospitalization.  She is on chronic home O2.  Follows with pulmonology.  5. Type 2 diabetes: No recent A1c on file.  Managed per PCP.  6. CKD stage IIIb: Creatinine was stable at 0.9 on 06/04/2023.  7. Bladder cancer: She notes that she saw her oncologist a couple of weeks ago and the decision was made to stop chemotherapy.  She was told that her bladder cancer is incurable and that this will be a terminal diagnosis.  In this setting, we discussed the role of hospice care.  I advised her to discuss this in more detail with her family and her primary care and/or oncologist.  She was receptive and verbalized understanding.  8. Disposition: Follow-up as needed.  Joylene Grapes, NP 06/11/2023, 12:37 PM

## 2023-06-11 NOTE — Patient Instructions (Signed)
Medication Instructions:  Your physician recommends that you continue on your current medications as directed. Please refer to the Current Medication list given to you today.  *If you need a refill on your cardiac medications before your next appointment, please call your pharmacy*   Lab Work: No Labs    Testing/Procedures: No Testing   Follow-Up: At [facility], you and your health needs are our priority.  As part of our continuing mission to provide you with exceptional heart care, we have created designated Provider Care Teams.  These Care Teams include your primary Cardiologist (physician) and Advanced Practice Providers (APPs -  Physician Assistants and Nurse Practitioners) who all work together to provide you with the care you need, when you need it.  We recommend signing up for the patient portal called "MyChart".  Sign up information is provided on this After Visit Summary.  MyChart is used to connect with patients for Virtual Visits (Telemedicine).  Patients are able to view lab/test results, encounter notes, upcoming appointments, etc.  Non-urgent messages can be sent to your provider as well.   To learn more about what you can do with MyChart, go to [email or web address].    Your next appointment:   As needed  Provider:   Lewayne Bunting MD

## 2023-07-17 ENCOUNTER — Other Ambulatory Visit (HOSPITAL_COMMUNITY): Payer: Self-pay | Admitting: Internal Medicine

## 2023-10-18 DEATH — deceased
